# Patient Record
Sex: Male | Born: 1950 | Race: White | Hispanic: No | Marital: Single | Smoking: Never smoker
Health system: Southern US, Community
[De-identification: ages and names within clinical notes are randomized; demographics above are authoritative.]

## PROBLEM LIST (undated history)

## (undated) DIAGNOSIS — K5790 Diverticulosis of intestine, part unspecified, without perforation or abscess without bleeding: Secondary | ICD-10-CM

## (undated) DIAGNOSIS — I499 Cardiac arrhythmia, unspecified: Secondary | ICD-10-CM

## (undated) DIAGNOSIS — Z905 Acquired absence of kidney: Secondary | ICD-10-CM

## (undated) DIAGNOSIS — G4730 Sleep apnea, unspecified: Secondary | ICD-10-CM

## (undated) DIAGNOSIS — R519 Headache, unspecified: Secondary | ICD-10-CM

## (undated) DIAGNOSIS — I10 Essential (primary) hypertension: Secondary | ICD-10-CM

## (undated) DIAGNOSIS — I2510 Atherosclerotic heart disease of native coronary artery without angina pectoris: Secondary | ICD-10-CM

## (undated) DIAGNOSIS — I509 Heart failure, unspecified: Secondary | ICD-10-CM

## (undated) DIAGNOSIS — B1920 Unspecified viral hepatitis C without hepatic coma: Secondary | ICD-10-CM

## (undated) DIAGNOSIS — Z8619 Personal history of other infectious and parasitic diseases: Secondary | ICD-10-CM

## (undated) DIAGNOSIS — N183 Chronic kidney disease, stage 3 (moderate): Secondary | ICD-10-CM

## (undated) DIAGNOSIS — R51 Headache: Principal | ICD-10-CM

## (undated) DIAGNOSIS — C649 Malignant neoplasm of unspecified kidney, except renal pelvis: Secondary | ICD-10-CM

## (undated) HISTORY — DX: Diverticulosis of intestine, part unspecified, without perforation or abscess without bleeding: K57.90

## (undated) HISTORY — DX: Headache: R51

## (undated) HISTORY — DX: Essential (primary) hypertension: I10

## (undated) HISTORY — DX: Chronic kidney disease, stage 3 (moderate): N18.3

## (undated) HISTORY — DX: Personal history of other infectious and parasitic diseases: Z86.19

## (undated) HISTORY — PX: CORONARY ANGIOPLASTY: SHX604

## (undated) HISTORY — DX: Malignant neoplasm of unspecified kidney, except renal pelvis: C64.9

## (undated) HISTORY — PX: CARDIAC VALVE REPLACEMENT: SHX585

## (undated) HISTORY — DX: Heart failure, unspecified: I50.9

## (undated) HISTORY — DX: Headache, unspecified: R51.9

## (undated) HISTORY — DX: Sleep apnea, unspecified: G47.30

## (undated) HISTORY — DX: Acquired absence of kidney: Z90.5

## (undated) HISTORY — PX: OTHER SURGICAL HISTORY: SHX169

## (undated) HISTORY — DX: Atherosclerotic heart disease of native coronary artery without angina pectoris: I25.10

---
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
## 1976-07-02 HISTORY — PX: NASAL SINUS SURGERY: SHX719

## 2004-10-20 ENCOUNTER — Emergency Department: Payer: Self-pay | Admitting: Emergency Medicine

## 2004-10-27 ENCOUNTER — Emergency Department: Payer: Self-pay | Admitting: Emergency Medicine

## 2005-10-23 ENCOUNTER — Emergency Department: Payer: Self-pay | Admitting: General Practice

## 2008-11-30 HISTORY — PX: COLONOSCOPY: SHX174

## 2008-12-06 ENCOUNTER — Ambulatory Visit: Payer: Self-pay | Admitting: Gastroenterology

## 2010-01-10 ENCOUNTER — Emergency Department: Payer: Self-pay | Admitting: Emergency Medicine

## 2011-06-21 LAB — COMPREHENSIVE METABOLIC PANEL
ALT: 15 U/L (ref 10–40)
AST: 19 U/L
Alkaline Phosphatase: 59 U/L
Creat: 1.01
Glucose: 98
Total Bilirubin: 0.7 mg/dL

## 2011-06-21 LAB — LIPID PANEL
Cholesterol: 184 mg/dL (ref 0–200)
Direct LDL: 118
HDL: 40 mg/dL (ref 35–70)
Triglycerides: 131

## 2011-06-21 LAB — PSA: PSA: 1.4

## 2011-07-03 DIAGNOSIS — I2510 Atherosclerotic heart disease of native coronary artery without angina pectoris: Secondary | ICD-10-CM

## 2011-07-03 DIAGNOSIS — C649 Malignant neoplasm of unspecified kidney, except renal pelvis: Secondary | ICD-10-CM

## 2011-07-03 DIAGNOSIS — Z905 Acquired absence of kidney: Secondary | ICD-10-CM

## 2011-07-03 HISTORY — DX: Malignant neoplasm of unspecified kidney, except renal pelvis: C64.9

## 2011-07-03 HISTORY — DX: Acquired absence of kidney: Z90.5

## 2011-07-03 HISTORY — DX: Atherosclerotic heart disease of native coronary artery without angina pectoris: I25.10

## 2011-10-16 ENCOUNTER — Ambulatory Visit: Payer: Self-pay | Admitting: Family Medicine

## 2011-11-27 ENCOUNTER — Ambulatory Visit: Payer: Self-pay | Admitting: Family Medicine

## 2011-11-29 ENCOUNTER — Emergency Department: Payer: Self-pay | Admitting: Emergency Medicine

## 2011-11-29 LAB — URINALYSIS, COMPLETE
RBC,UR: 14310 /HPF (ref 0–5)
Squamous Epithelial: NONE SEEN
WBC UR: 66 /HPF (ref 0–5)

## 2011-11-29 LAB — CBC
HCT: 38.7 % — ABNORMAL LOW (ref 40.0–52.0)
HGB: 12.7 g/dL — ABNORMAL LOW (ref 13.0–18.0)
MCH: 29 pg (ref 26.0–34.0)
MCHC: 32.9 g/dL (ref 32.0–36.0)
MCV: 88 fL (ref 80–100)
Platelet: 172 10*3/uL (ref 150–440)
RBC: 4.38 10*6/uL — ABNORMAL LOW (ref 4.40–5.90)
RDW: 14.4 % (ref 11.5–14.5)
WBC: 10.2 10*3/uL (ref 3.8–10.6)

## 2011-11-29 LAB — COMPREHENSIVE METABOLIC PANEL
Albumin: 4.4 g/dL (ref 3.4–5.0)
Alkaline Phosphatase: 70 U/L (ref 50–136)
Anion Gap: 11 (ref 7–16)
BUN: 14 mg/dL (ref 7–18)
Bilirubin,Total: 1.1 mg/dL — ABNORMAL HIGH (ref 0.2–1.0)
Calcium, Total: 8.8 mg/dL (ref 8.5–10.1)
Chloride: 102 mmol/L (ref 98–107)
Co2: 24 mmol/L (ref 21–32)
Creatinine: 1.14 mg/dL (ref 0.60–1.30)
EGFR (African American): >60
EGFR (Non-African Amer.): >60
Glucose: 107 mg/dL — ABNORMAL HIGH (ref 65–99)
Osmolality: 275 (ref 275–301)
Potassium: 4.2 mmol/L (ref 3.5–5.1)
SGOT(AST): 23 U/L (ref 15–37)
SGPT (ALT): 21 U/L
Sodium: 137 mmol/L (ref 136–145)
Total Protein: 7.7 g/dL (ref 6.4–8.2)

## 2011-12-06 ENCOUNTER — Ambulatory Visit: Payer: Self-pay | Admitting: Urology

## 2011-12-31 HISTORY — PX: NEPHRECTOMY: SHX65

## 2012-01-11 LAB — COMPREHENSIVE METABOLIC PANEL
ALT: 23 U/L (ref 10–40)
AST: 24 U/L
Alkaline Phosphatase: 63 U/L
BUN: 13 mg/dL (ref 4–21)
Creat: 0.9
Glucose: 93
Potassium: 4.3 mmol/L
Sodium: 145 mmol/L (ref 137–147)
Total Bilirubin: 0.6 mg/dL

## 2012-01-11 LAB — CBC
Hemoglobin: 13.5 g/dL (ref 13.5–17.5)
WBC: 5.4
platelet count: 213

## 2012-02-01 ENCOUNTER — Ambulatory Visit (INDEPENDENT_AMBULATORY_CARE_PROVIDER_SITE_OTHER): Payer: BC Managed Care – PPO | Admitting: Family Medicine

## 2012-02-01 ENCOUNTER — Encounter: Payer: Self-pay | Admitting: Family Medicine

## 2012-02-01 VITALS — BP 152/90 | HR 68 | Temp 98.0°F | Ht 72.0 in | Wt 206.0 lb

## 2012-02-01 DIAGNOSIS — Z905 Acquired absence of kidney: Secondary | ICD-10-CM

## 2012-02-01 DIAGNOSIS — C649 Malignant neoplasm of unspecified kidney, except renal pelvis: Secondary | ICD-10-CM

## 2012-02-01 DIAGNOSIS — I10 Essential (primary) hypertension: Secondary | ICD-10-CM

## 2012-02-01 DIAGNOSIS — R519 Headache, unspecified: Secondary | ICD-10-CM | POA: Insufficient documentation

## 2012-02-01 DIAGNOSIS — Z85528 Personal history of other malignant neoplasm of kidney: Secondary | ICD-10-CM | POA: Insufficient documentation

## 2012-02-01 DIAGNOSIS — R51 Headache: Secondary | ICD-10-CM

## 2012-02-01 MED ORDER — FLUTICASONE PROPIONATE 50 MCG/ACT NA SUSP: 2.0000 | Freq: Every day | NASAL | Status: DC

## 2012-02-01 NOTE — Assessment & Plan Note (Signed)
Told needs lifelong monitoring by [facility] given furhman stage 3.

## 2012-02-01 NOTE — Progress Notes (Signed)
Subjective:    Patient ID: Aaron Cole, male    DOB: Jul 22, 1950, 61 y.o.   MRN: [id number]  HPI CC: new pt establish  Prior saw Dr. Bethena Midget at [facility] medical who retired.  Recent R nephrectomy for RCC, stage 1, furhman scale 3 - brings copy of cat scan - told to check on heart because there was some calcium in heart vessels.  Sees Dr. Joseph Art at cancer center at [facility], told needed to be monitored for future tumors.  Found after hematuria 10/2011.  Seen by Dr. Achilles Dunk, urology.  ?CAD on CT - denies chest pain, tightness, SOB.  Stays very active bicycling.  Good aerobic exercise.  HTN - only on micardis.  H/o this for last 6 yrs.  Today elevated, but states at home bp runs 120-130systolic.  Frequent headaches - once a week.  Severe throbbing headache with pressure, frontal bilateral.  Not associated with nausea, photophonophobia.  Usually treats with aspirin 1000mg  bid.  Takes flonase with headaches.  H/o sinus surgery in past as young adult.  Stays congested/RN.  Doesn't like daily antihistamine.  Has flonase but uses only as needed.  Preventative: Colonoscopy 10/2009 - told normal, rec rpt 8 yrs. Last CPE 06/2011.  Caffeine: occasional Lives alone, 1 cat Occupation: farming business Edu: BS Activity: bikes 3-4x/wk about 1 hour Diet: good water, fruits/vegetables daily, steak 1x/wk, fish 2x/wk  Medications and allergies reviewed and updated in chart.  Past histories reviewed and updated if relevant as below. There is no problem list on file for this patient.  Past Medical History  Diagnosis Date  . Generalized headaches     frequent; sinus related  . HTN (hypertension)   . Renal cell carcinoma     s/p R nephrectomy   Past Surgical History  Procedure Date  . Nephrectomy     [facility], R RCC  . Nasal sinus surgery 1978    sinus surgery done at [facility]   History  Substance Use Topics  . Smoking status: Never Smoker   . Smokeless tobacco: Never Used  . Alcohol Use: Yes   Socially   Family History  Problem Relation Age of Onset  . Cancer Paternal Grandmother     throat  . Heart disease Mother   . Heart disease Father   . Coronary artery disease Maternal Uncle     MI  . Cancer Paternal Aunt     breast   . Hypertension Mother   . Diabetes Father   . Stroke Neg Hx    Allergies  Allergen Reactions  . Tylenol (Acetaminophen) Nausea And Vomiting   Current Outpatient Prescriptions on File Prior to Visit  Medication Sig Dispense Refill  . fluticasone (FLONASE) 50 MCG/ACT nasal spray Place 2 sprays into the nose daily.      . sildenafil (VIAGRA) 100 MG tablet Take 100 mg by mouth as needed.      Marland Kitchen telmisartan (MICARDIS) 40 MG tablet Take 40 mg by mouth daily.         Review of Systems  Constitutional: Negative for fever, chills, activity change, appetite change, fatigue and unexpected weight change.  HENT: Negative for hearing loss and neck pain.   Eyes: Negative for visual disturbance.  Respiratory: Negative for cough, chest tightness, shortness of breath and wheezing.   Cardiovascular: Negative for chest pain, palpitations and leg swelling.  Gastrointestinal: Negative for nausea, vomiting, abdominal pain, diarrhea, constipation, blood in stool and abdominal distention.  Genitourinary: Negative for hematuria and  difficulty urinating.  Musculoskeletal: Negative for myalgias and arthralgias.  Skin: Negative for rash.  Neurological: Positive for headaches. Negative for dizziness, seizures and syncope.  Hematological: Does not bruise/bleed easily.  Psychiatric/Behavioral: Negative for dysphoric mood. The patient is not nervous/anxious.        Objective:   Physical Exam  Nursing note and vitals reviewed. Constitutional: He is oriented to person, place, and time. He appears well-developed and well-nourished. No distress.  HENT:  Head: Normocephalic and atraumatic.  Right Ear: External ear normal.  Left Ear: External ear normal.  Nose: Nose normal.   Mouth/Throat: Oropharynx is clear and moist.  Eyes: Conjunctivae and EOM are normal. Pupils are equal, round, and reactive to light.  Neck: Normal range of motion. Neck supple.  Cardiovascular: Normal rate, regular rhythm, normal heart sounds and intact distal pulses.   No murmur heard. Pulses:      Radial pulses are 2+ on the right side, and 2+ on the left side.  Pulmonary/Chest: Effort normal and breath sounds normal. No respiratory distress. He has no wheezes. He has no rales.  Abdominal: Soft. Bowel sounds are normal. He exhibits no distension and no mass. There is no tenderness. There is no rebound and no guarding.       Incision on abd c/d/i, healing  Musculoskeletal: Normal range of motion. He exhibits no edema.  Lymphadenopathy:    He has no cervical adenopathy.  Neurological: He is alert and oriented to person, place, and time.       CN grossly intact, station and gait intact  Skin: Skin is warm and dry. No rash noted.  Psychiatric: He has a normal mood and affect. His behavior is normal. Judgment and thought content normal.       Assessment & Plan:

## 2012-02-01 NOTE — Assessment & Plan Note (Signed)
Chronic, stable. Continue micardis. Will need close monitoring, discussed new goal will likely be <130/80 States at home better control, so no changes today. Asked him to call me if bp running consistently high.

## 2012-02-01 NOTE — Patient Instructions (Signed)
Request records from Dr. Achilles Dunk for me. Try daily flonase 2 squirts in each nostril daily. Ok to use aspirin, but watch stomach upset on aspirin. I will review records. Return as needed or after December for physical.

## 2012-02-01 NOTE — Assessment & Plan Note (Addendum)
Sound like sinus headaches. Discussed avoidance of NSAIDs given sole kidney. Recommended daily flonase to try and minimize headaches. Will await records from prior pcp, consider referral to ENT.

## 2012-02-01 NOTE — Assessment & Plan Note (Signed)
Now chronic renal disease given h/o nephrectomy. Discussed importance of hydration as well as avoiding NSAIDs.

## 2012-02-09 ENCOUNTER — Encounter: Payer: Self-pay | Admitting: Family Medicine

## 2012-02-15 ENCOUNTER — Encounter: Payer: Self-pay | Admitting: Family Medicine

## 2012-03-03 ENCOUNTER — Encounter: Payer: Self-pay | Admitting: Family Medicine

## 2012-03-10 ENCOUNTER — Encounter: Payer: Self-pay | Admitting: Family Medicine

## 2012-03-17 DIAGNOSIS — C649 Malignant neoplasm of unspecified kidney, except renal pelvis: Secondary | ICD-10-CM | POA: Insufficient documentation

## 2012-06-11 ENCOUNTER — Other Ambulatory Visit: Payer: Self-pay | Admitting: *Deleted

## 2012-06-11 MED ORDER — TELMISARTAN 40 MG PO TABS: 40.0000 mg | ORAL_TABLET | Freq: Every day | ORAL | Status: DC

## 2012-07-04 ENCOUNTER — Encounter: Payer: BC Managed Care – PPO | Admitting: Family Medicine

## 2012-07-04 ENCOUNTER — Other Ambulatory Visit: Payer: BC Managed Care – PPO

## 2012-07-09 ENCOUNTER — Other Ambulatory Visit: Payer: BC Managed Care – PPO

## 2012-07-11 ENCOUNTER — Encounter: Payer: BC Managed Care – PPO | Admitting: Family Medicine

## 2012-07-13 ENCOUNTER — Other Ambulatory Visit: Payer: Self-pay | Admitting: Family Medicine

## 2012-07-13 DIAGNOSIS — Z125 Encounter for screening for malignant neoplasm of prostate: Secondary | ICD-10-CM

## 2012-07-13 DIAGNOSIS — I10 Essential (primary) hypertension: Secondary | ICD-10-CM

## 2012-07-16 ENCOUNTER — Encounter: Payer: BC Managed Care – PPO | Admitting: Family Medicine

## 2012-07-16 ENCOUNTER — Other Ambulatory Visit: Payer: BC Managed Care – PPO

## 2012-07-23 ENCOUNTER — Encounter: Payer: BC Managed Care – PPO | Admitting: Family Medicine

## 2012-07-25 ENCOUNTER — Other Ambulatory Visit (INDEPENDENT_AMBULATORY_CARE_PROVIDER_SITE_OTHER): Payer: BC Managed Care – PPO

## 2012-07-25 DIAGNOSIS — I10 Essential (primary) hypertension: Secondary | ICD-10-CM

## 2012-07-25 DIAGNOSIS — Z125 Encounter for screening for malignant neoplasm of prostate: Secondary | ICD-10-CM

## 2012-07-25 LAB — PSA: PSA: 2.53 ng/mL (ref 0.10–4.00)

## 2012-07-25 LAB — BASIC METABOLIC PANEL
CO2: 27 mEq/L (ref 19–32)
Calcium: 9.3 mg/dL (ref 8.4–10.5)
Chloride: 107 mEq/L (ref 96–112)
Creatinine, Ser: 1.3 mg/dL (ref 0.4–1.5)
Glucose, Bld: 101 mg/dL — ABNORMAL HIGH (ref 70–99)

## 2012-07-25 LAB — LIPID PANEL
HDL: 36 mg/dL — ABNORMAL LOW (ref >39.00)
LDL Cholesterol: 124 mg/dL — ABNORMAL HIGH (ref 0–99)
Total CHOL/HDL Ratio: 5
Triglycerides: 127 mg/dL (ref 0.0–149.0)

## 2012-07-28 ENCOUNTER — Encounter: Payer: BC Managed Care – PPO | Admitting: Family Medicine

## 2012-08-01 ENCOUNTER — Encounter: Payer: Self-pay | Admitting: Family Medicine

## 2012-08-01 ENCOUNTER — Encounter: Payer: BC Managed Care – PPO | Admitting: Family Medicine

## 2012-08-01 ENCOUNTER — Ambulatory Visit (INDEPENDENT_AMBULATORY_CARE_PROVIDER_SITE_OTHER): Payer: BC Managed Care – PPO | Admitting: Family Medicine

## 2012-08-01 VITALS — BP 120/92 | HR 71 | Temp 98.9°F | Ht 70.5 in | Wt 208.0 lb

## 2012-08-01 DIAGNOSIS — C649 Malignant neoplasm of unspecified kidney, except renal pelvis: Secondary | ICD-10-CM

## 2012-08-01 DIAGNOSIS — I10 Essential (primary) hypertension: Secondary | ICD-10-CM

## 2012-08-01 DIAGNOSIS — Z0000 Encounter for general adult medical examination without abnormal findings: Secondary | ICD-10-CM

## 2012-08-01 DIAGNOSIS — R519 Headache, unspecified: Secondary | ICD-10-CM

## 2012-08-01 DIAGNOSIS — R51 Headache: Secondary | ICD-10-CM

## 2012-08-01 NOTE — Assessment & Plan Note (Signed)
Improved with flonase 

## 2012-08-01 NOTE — Patient Instructions (Addendum)
Check blood pressure at home once a week, and let me know if consistently >140/90. Sign release form for CT scan report from [facility]. Return on Monday for Tdap. We will start checking stool again around 2016. Good to see you today, call us with questions.

## 2012-08-01 NOTE — Assessment & Plan Note (Addendum)
Preventative protocols reviewed and updated unless pt declined. Discussed healthy diet and lifestyle. ?of CAD on recent CT scan last year - I have requested records of CT report to review. If significant, may recommend start ASA daily.

## 2012-08-01 NOTE — Assessment & Plan Note (Signed)
BP Readings from Last 3 Encounters:  08/01/12 120/92  02/01/12 152/90  Slightly elevated today. Recommended he monitor at home and update me if bp staying elevated at home.

## 2012-08-01 NOTE — Assessment & Plan Note (Signed)
Knows to avoid nsaids. Sees urologist/onc at [facility] yearly.

## 2012-08-01 NOTE — Progress Notes (Signed)
Subjective:    Patient ID: Aaron Cole, male    DOB: 31-Jan-1951, 62 y.o.   MRN: [id number]  HPI CC: CPE  DOT CPE last week.  Sees urologist once a year [facility]).  H/o R RCC s/p R nephrectomy. Lab Results  Component Value Date   CREATININE 1.3 07/25/2012    Preventative:  Colonoscopy 10/2009 - told normal, rec rpt 8 yrs.  Prostate cancer screening - will continue screening. Flu shot done Tetanus - will get Monday.  Caffeine: occasional  Lives alone, 1 cat  Occupation: farming business  Edu: BS  Activity: bikes 3-4x/wk about 1 hour  Diet: good water, fruits/vegetables daily, steak 1x/wk, fish 2x/wk  Medications and allergies reviewed and updated in chart.  Past histories reviewed and updated if relevant as below. Patient Active Problem List  Diagnosis  . Sinus headache  . HTN (hypertension)  . Renal cell carcinoma  . S/p nephrectomy   Past Medical History  Diagnosis Date  . Sinus headache     recurrent  . HTN (hypertension)   . Renal cell carcinoma 2013    s/p R nephrectomy  . S/p nephrectomy 2013    [facility]  . CAD (coronary artery disease) 2013    per CT scan  - brings CD of CT, no report - will need [facility] records of CT scan report  . Diverticulosis     by CT scan   Past Surgical History  Procedure Date  . Nasal sinus surgery 1978    sinus surgery done at [facility]  . Rectal fistula repair 1980s  . Nephrectomy 12/2011    [facility], R for RCC   History  Substance Use Topics  . Smoking status: Never Smoker   . Smokeless tobacco: Never Used  . Alcohol Use: Yes     Comment: Socially   Family History  Problem Relation Age of Onset  . Cancer Paternal Grandmother     throat  . Heart disease Mother   . Heart disease Father   . Coronary artery disease Maternal Uncle     MI  . Cancer Paternal Aunt     breast   . Hypertension Mother   . Diabetes Father   . Stroke Neg Hx   . Cancer Paternal Aunt     brain cancer   Allergies  Allergen Reactions  . Tylenol  (Acetaminophen) Nausea And Vomiting   Current Outpatient Prescriptions on File Prior to Visit  Medication Sig Dispense Refill  . fluticasone (FLONASE) 50 MCG/ACT nasal spray Place 2 sprays into the nose daily.  16 g  11  . telmisartan (MICARDIS) 40 MG tablet Take 1 tablet (40 mg total) by mouth daily.  30 tablet  6  . sildenafil (VIAGRA) 100 MG tablet Take 100 mg by mouth as needed.        Review of Systems  Constitutional: Positive for fever (head cold recently). Negative for chills, activity change, appetite change, fatigue and unexpected weight change.  HENT: Negative for hearing loss and neck pain.   Eyes: Negative for visual disturbance.  Respiratory: Positive for cough. Negative for chest tightness, shortness of breath and wheezing.   Cardiovascular: Negative for chest pain, palpitations and leg swelling.  Gastrointestinal: Negative for nausea, vomiting, abdominal pain, diarrhea, constipation, blood in stool and abdominal distention.  Genitourinary: Negative for hematuria and difficulty urinating.  Musculoskeletal: Negative for myalgias and arthralgias.  Skin: Negative for rash.  Neurological: Positive for headaches. Negative for dizziness, seizures and syncope.  Hematological: Does  not bruise/bleed easily.  Psychiatric/Behavioral: Negative for dysphoric mood. The patient is not nervous/anxious.        Objective:   Physical Exam  Nursing note and vitals reviewed. Constitutional: He is oriented to person, place, and time. He appears well-developed and well-nourished. No distress.  HENT:  Head: Normocephalic and atraumatic.  Right Ear: Hearing, tympanic membrane, external ear and ear canal normal.  Left Ear: Hearing, tympanic membrane, external ear and ear canal normal.  Nose: Nose normal.  Mouth/Throat: Oropharynx is clear and moist. No oropharyngeal exudate.  Eyes: Conjunctivae normal and EOM are normal. Pupils are equal, round, and reactive to light. No scleral icterus.   Neck: Normal range of motion. Neck supple. No thyromegaly present.  Cardiovascular: Normal rate, regular rhythm and intact distal pulses.   Occasional extrasystoles are present.  Murmur (slight systolic murmur) heard. Pulses:      Radial pulses are 2+ on the right side, and 2+ on the left side.  Pulmonary/Chest: Effort normal and breath sounds normal. No respiratory distress. He has no wheezes. He has no rales.  Abdominal: Soft. Bowel sounds are normal. He exhibits no distension and no mass. There is no tenderness. There is no rebound and no guarding.  Genitourinary: Rectum normal and prostate normal. Rectal exam shows no external hemorrhoid, no internal hemorrhoid, no fissure, no mass, no tenderness and anal tone normal. Prostate is not enlarged (20gm) and not tender.  Musculoskeletal: Normal range of motion. He exhibits no edema.  Lymphadenopathy:    He has no cervical adenopathy.  Neurological: He is alert and oriented to person, place, and time.       CN grossly intact, station and gait intact  Skin: Skin is warm and dry. No rash noted.  Psychiatric: He has a normal mood and affect. His behavior is normal. Judgment and thought content normal.       Assessment & Plan:

## 2012-08-07 ENCOUNTER — Ambulatory Visit: Payer: BC Managed Care – PPO

## 2012-08-19 ENCOUNTER — Ambulatory Visit (INDEPENDENT_AMBULATORY_CARE_PROVIDER_SITE_OTHER): Payer: BC Managed Care – PPO | Admitting: *Deleted

## 2012-08-19 DIAGNOSIS — Z23 Encounter for immunization: Secondary | ICD-10-CM

## 2012-09-10 ENCOUNTER — Ambulatory Visit (INDEPENDENT_AMBULATORY_CARE_PROVIDER_SITE_OTHER): Payer: BC Managed Care – PPO | Admitting: Family Medicine

## 2012-09-10 ENCOUNTER — Encounter: Payer: Self-pay | Admitting: Family Medicine

## 2012-09-10 VITALS — BP 120/90 | HR 80 | Temp 99.0°F | Ht 70.5 in | Wt 210.8 lb

## 2012-09-10 DIAGNOSIS — R040 Epistaxis: Secondary | ICD-10-CM

## 2012-09-11 NOTE — Progress Notes (Signed)
Nature conservation officer at [facility] [address] [place] [address] Phone: [phone] Fax: [phone]  Date:  09/10/2012   Name:  Aaron Cole   DOB:  09-25-1950   MRN:  [id number] Gender: male Age: 62 y.o.  Primary Physician:  Eustaquio Boyden, MD  Evaluating MD: Hannah Beat, MD   Chief Complaint: nose bleeds   History of Present Illness:  Aaron Cole is a 62 y.o. pleasant patient who presents with the following:  Pleasant gentleman who has had 4 episodes of epistaxis in the last 2 days. They have not been profuse, but several of them have woken him up from sleep. He has been able to get his nose to stop bleeding within a few minutes to 10 minutes. This has not been a long-standing problem for him. He has to use his Flonase in the past, but he has used it most recently several weeks ago. He also does occasionally takes an aspirin products, but they do not align with his most recent nosebleeds. No trauma.  Patient Active Problem List  Diagnosis  . Sinus headache  . HTN (hypertension)  . Renal cell carcinoma  . S/p nephrectomy  . Routine health maintenance    Past Medical History  Diagnosis Date  . Sinus headache     recurrent  . HTN (hypertension)   . Renal cell carcinoma 2013    s/p R nephrectomy  . S/p nephrectomy 2013    [facility]  . CAD (coronary artery disease) 2013    per CT scan  - brings CD of CT, no report - will need [facility] records of CT scan report  . Diverticulosis     by CT scan    Past Surgical History  Procedure Laterality Date  . Nasal sinus surgery  1978    sinus surgery done at [facility]  . Rectal fistula repair  1980s  . Nephrectomy  12/2011    [facility], R for RCC    History   Social History  . Marital Status: Single    Spouse Name: N/A    Number of Children: N/A  . Years of Education: N/A   Occupational History  . Not on file.   Social History Main Topics  . Smoking status: Never Smoker   . Smokeless tobacco: Never Used  .  Alcohol Use: Yes     Comment: Socially  . Drug Use: No  . Sexually Active: Not on file   Other Topics Concern  . Not on file   Social History Narrative   Caffeine: occasional   Lives alone, 1 cat   Occupation: farming business   Edu: BS   Activity: bikes 3-4x/wk about 1 hour   Diet: good water, fruits/vegetables daily, steak 1x/wk, fish 2x/wk    Family History  Problem Relation Age of Onset  . Cancer Paternal Grandmother     throat  . Heart disease Mother   . Heart disease Father   . Coronary artery disease Maternal Uncle     MI  . Cancer Paternal Aunt     breast   . Hypertension Mother   . Diabetes Father   . Stroke Neg Hx   . Cancer Paternal Aunt     brain cancer    Allergies  Allergen Reactions  . Tylenol (Acetaminophen) Nausea And Vomiting    Medication list has been reviewed and updated.  Outpatient Prescriptions Prior to Visit  Medication Sig Dispense Refill  . fluticasone (FLONASE) 50 MCG/ACT nasal  spray Place 2 sprays into the nose daily.  16 g  11  . telmisartan (MICARDIS) 40 MG tablet Take 1 tablet (40 mg total) by mouth daily.  30 tablet  6  . sildenafil (VIAGRA) 100 MG tablet Take 100 mg by mouth as needed.       No facility-administered medications prior to visit.    Review of Systems:   GEN: No acute illnesses, no fevers, chills. GI: No n/v/d, eating normally Pulm: No SOB Interactive and getting along well at home.  Otherwise, ROS is as per the HPI.   Physical Examination: BP 120/90  Pulse 80  Temp(Src) 99 F (37.2 C) (Oral)  Ht 5' 10.5" (1.791 m)  Wt 210 lb 12 oz (95.596 kg)  BMI 29.8 kg/m2  SpO2 96%  Ideal Body Weight: Weight in (lb) to have BMI = 25: 176.4   GEN: WDWN, NAD, Non-toxic, A & O x 3 HEENT: Atraumatic, Normocephalic. Neck supple. No masses, No LAD. Ears and Nose: No external deformity. CV: RRR, No M/G/R. No JVD. No thrill. No extra heart sounds. PULM: CTA B, no wheezes, crackles, rhonchi. No retractions. No resp.  distress. No accessory muscle use. EXTR: No c/c/e NEURO Normal gait.  PSYCH: Normally interactive. Conversant. Not depressed or anxious appearing.  Calm demeanor.    Assessment and Plan: Epistaxis   Afrin twice a day in the nose for the next 3 days. If acute nose bleed, that he can do some acute Afrin and then also use a teabag at the base of his nose. If his symptoms continue and persist over the next week, asked him to call us and he is going to follow up with an ear nose and throat specialist. Overall, reassured him.  Signed, Elpidio Galea. Cordarrel Stiefel, MD 09/11/2012 10:29 AM

## 2012-09-22 ENCOUNTER — Other Ambulatory Visit: Payer: Self-pay

## 2012-09-22 MED ORDER — SILDENAFIL CITRATE 100 MG PO TABS: 50.0000 mg | ORAL_TABLET | ORAL | Status: DC | PRN

## 2012-09-22 NOTE — Telephone Encounter (Signed)
Pt left v/m requesting refill Viagra; pt said [facility] requested on Fri and pt wants call back when sent to pharmacy.Please advise.

## 2012-09-22 NOTE — Telephone Encounter (Signed)
Sent in.plz notify pt.  

## 2012-09-22 NOTE — Telephone Encounter (Signed)
Message left notifying patient.

## 2012-10-02 ENCOUNTER — Ambulatory Visit (INDEPENDENT_AMBULATORY_CARE_PROVIDER_SITE_OTHER): Payer: BC Managed Care – PPO | Admitting: Family Medicine

## 2012-10-02 ENCOUNTER — Encounter: Payer: Self-pay | Admitting: Family Medicine

## 2012-10-02 VITALS — BP 130/92 | HR 84 | Temp 98.8°F | Ht 72.0 in | Wt 208.5 lb

## 2012-10-02 DIAGNOSIS — N433 Hydrocele, unspecified: Secondary | ICD-10-CM | POA: Insufficient documentation

## 2012-10-02 DIAGNOSIS — N5089 Other specified disorders of the male genital organs: Secondary | ICD-10-CM

## 2012-10-02 LAB — POCT URINALYSIS DIPSTICK
Ketones, UA: NEGATIVE
Leukocytes, UA: NEGATIVE
Protein, UA: NEGATIVE
Spec Grav, UA: 1.02
Urobilinogen, UA: 0.2
pH, UA: 6

## 2012-10-02 NOTE — Patient Instructions (Signed)
pass by Aaron Cole's office for referral for ultrasound. I think you have hydrocele which we can just monitor as long as pain is not developing. watch for pain, fevers/chills, urethral discharge, or urinary changes.

## 2012-10-02 NOTE — Progress Notes (Signed)
  Subjective:    Patient ID: Aaron Cole, male    DOB: 1950-12-25, 62 y.o.   MRN: [id number]  HPI CC: R testicular swelling  Over last few weeks, noticing enlarging testicle on right.  Denies falls or injury.  No pain.  No fevers/chills, dysuria, urgency, frequency, hematuria.  No urethral discharge.  Never had anything like this before.  Currently sexually active, 6 partners in last year.  Protection 50% of time.  H/o gonorrhea, treated, 6-7 yrs ago.  No other STDs.  Past Medical History  Diagnosis Date  . Sinus headache     recurrent  . HTN (hypertension)   . Renal cell carcinoma 2013    s/p R nephrectomy  . S/p nephrectomy 2013    [facility]  . CAD (coronary artery disease) 2013    per CT scan  - brings CD of CT, no report - will need [facility] records of CT scan report  . Diverticulosis     by CT scan    Review of Systems Per HPI    Objective:   Physical Exam  Nursing note and vitals reviewed. Constitutional: He appears well-developed and well-nourished. No distress.  Abdominal: Soft. Normal appearance and bowel sounds are normal. He exhibits no distension and no mass. There is no hepatosplenomegaly. There is no tenderness. There is no rigidity, no rebound, no guarding, no CVA tenderness and negative Murphy's sign. No hernia. Hernia confirmed negative in the right inguinal area and confirmed negative in the left inguinal area.  Genitourinary: Penis normal. Right testis shows mass and swelling. Right testis shows no tenderness. Right testis is descended. Left testis shows no mass, no swelling and no tenderness. Left testis is descended. Circumcised. No penile erythema. No discharge found.  R testicle enlarged with evident cystic swelling, ?hydrocele associated with actual testicle. No tenderness to palpation.  Lymphadenopathy:       Right: No inguinal adenopathy present.       Left: No inguinal adenopathy present.       Assessment & Plan:

## 2012-10-03 ENCOUNTER — Encounter: Payer: Self-pay | Admitting: Family Medicine

## 2012-10-03 NOTE — Assessment & Plan Note (Signed)
?   hydrocele associated with R testicle. Nontender, doubt infectious Will obtain US scrotum to further evaluate. UA WNL today. Encouraged protection 100%.

## 2012-10-03 NOTE — Addendum Note (Signed)
Addended by: Josph Macho A on: 10/03/2012 07:57 AM   Modules accepted: Orders

## 2012-10-06 ENCOUNTER — Ambulatory Visit: Payer: Self-pay | Admitting: Family Medicine

## 2012-10-07 ENCOUNTER — Encounter: Payer: Self-pay | Admitting: Family Medicine

## 2012-12-08 ENCOUNTER — Encounter: Payer: Self-pay | Admitting: Family Medicine

## 2013-03-10 DIAGNOSIS — C649 Malignant neoplasm of unspecified kidney, except renal pelvis: Secondary | ICD-10-CM | POA: Insufficient documentation

## 2013-03-24 IMAGING — CT CT ABD-PELV W/ CM
1 series · 1 of 1 positions shown, 3 images · non-contrast
Comparison: none

REASON FOR EXAM: WITH DELAYS Renal cyst
COMMENTS:

[Series 1011: results ct abdomen · 0.59mm/px · 1 of 1 slices shown, 3 images]
[im 1/1  soft-tissue]
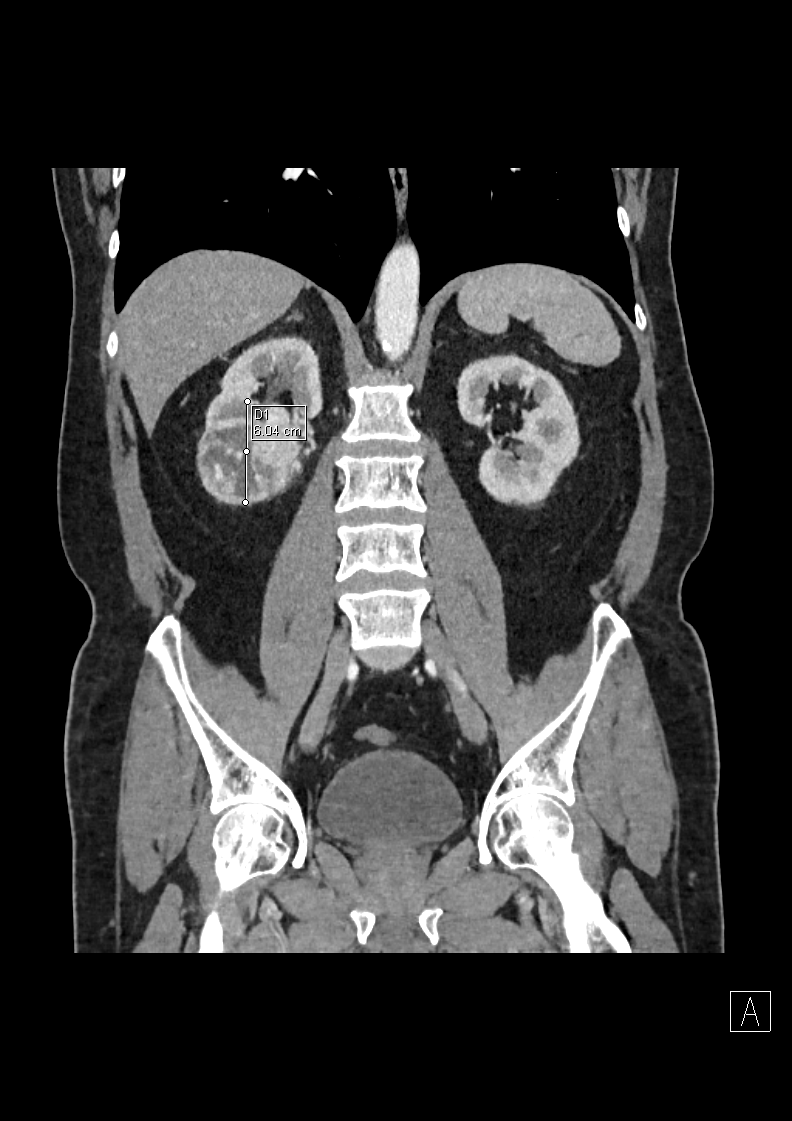
[im 1/1  lung]
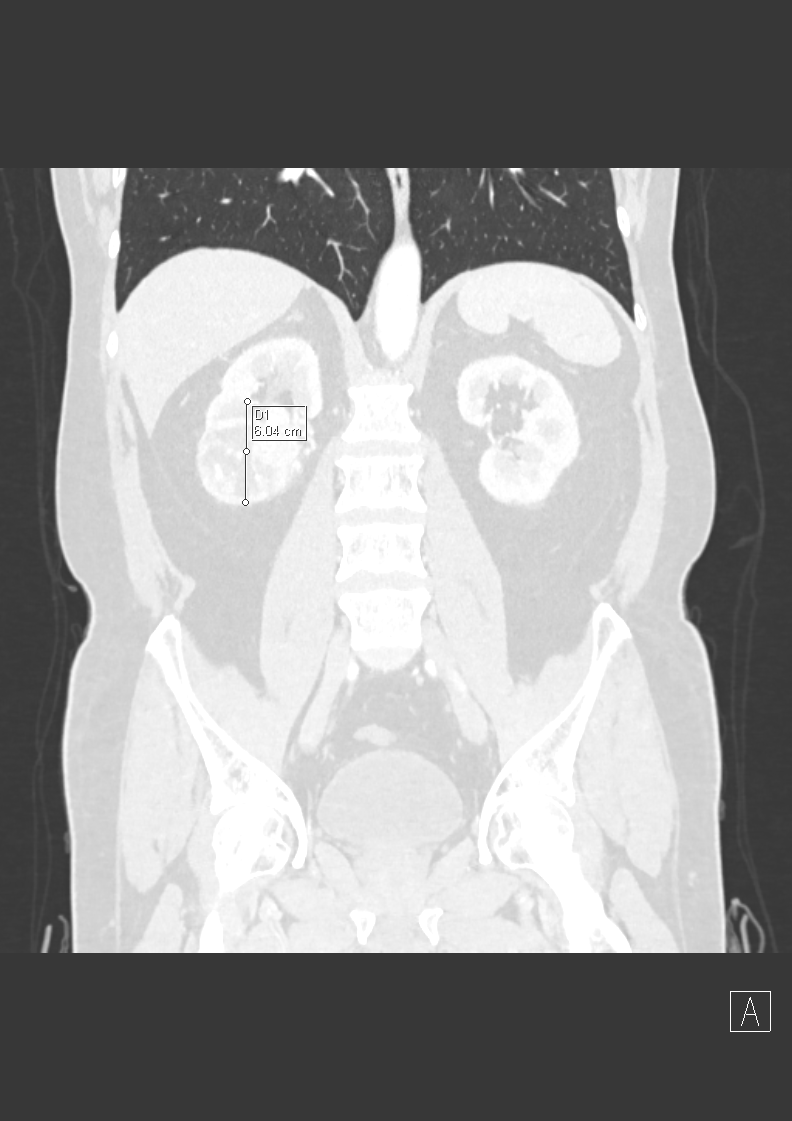
[im 1/1  bone]
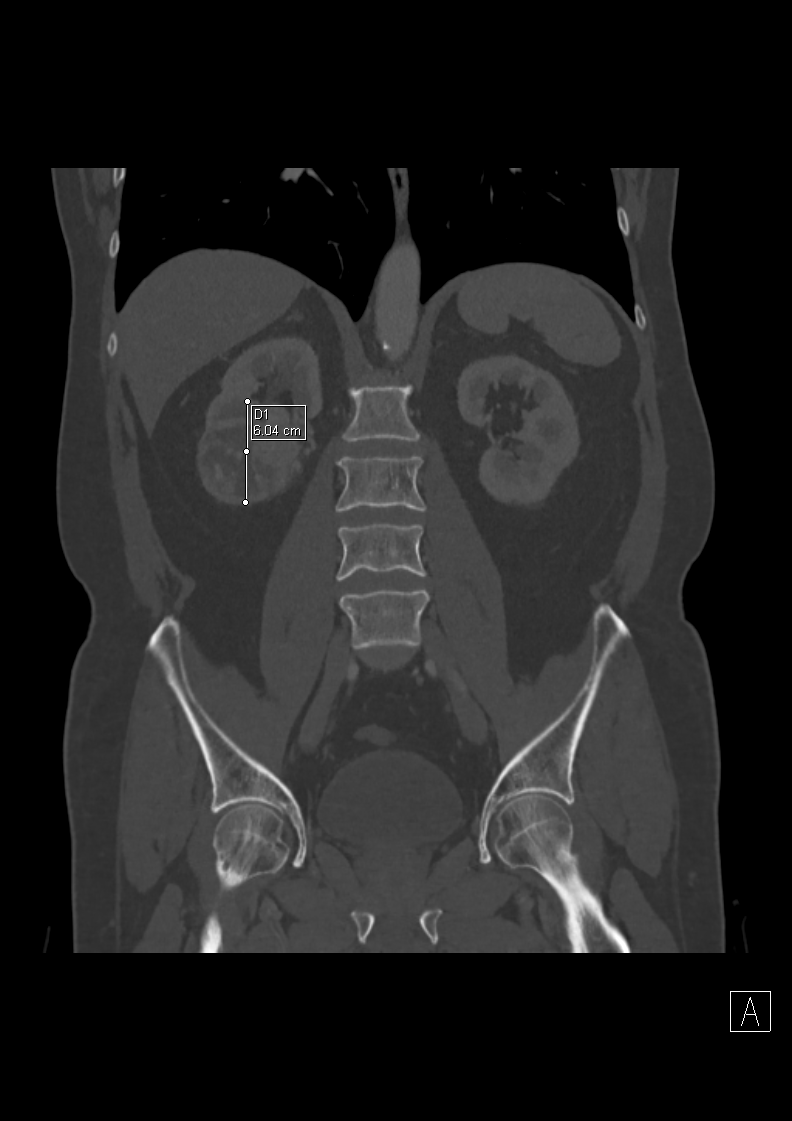

[1 of 1 positions shown; findings below may reference images not displayed]

PROCEDURE:     KCT - KCT ABDOMEN/PELVIS W  - December 06, 2011 [DATE]

RESULT:     Axial CT scanning was performed through the abdomen and pelvis
with reconstructions at 3 mm intervals and slice thicknesses. The patient
received 100 cc of Esovue-7RM. Comparison is made to the study 29 November, 2011. Review of multiplanar reconstructed images was performed separately on
the VIA monitor.

In the midpole of the right kidney posteriorly there is a large mass which
demonstrates heterogeneous enhancement. The mass measures 6.5 cm AP by
cm transversely by 6 cm in superior to inferior dimension. It remains
abnormal in appearance on the delayed images. Some extension of the mass
into the right renal pelvis is suspected. On the left I see no abnormal
masses. Neither kidney exhibits evidence of obstruction. There may be a
subcentimeter lymph node posterior to the right kidney on image 52. On
delayed images contrast is fractionally seen in the ureters. The partially
distended urinary bladder is normal in appearance.

The liver, spleen, partially distended stomach, adrenal glands, and pancreas
are normal in appearance. There is a small hiatal hernia. The gallbladder is
only partially distended. The caliber of the abdominal aorta is normal. I
see no periaortic nor pericaval lymphadenopathy. There is no evidence of
extension of tumor into the right renal vein.

The unopacified loops of small and large bowel exhibit no acute abnormality.
The prostate gland and seminal vesicles and urinary bladder exhibit no acute
abnormality. The lung bases are clear.
IMPRESSION: 1. There is an abnormal mass involving the midpole of the right kidney as
described. This is suspicious for malignancy. It may extend into the renal
pelvis.
2. I see no evidence of abnormal masses on the left.
3. I do not see acute abnormality elsewhere within the abdomen or pelvis.

## 2013-04-02 ENCOUNTER — Other Ambulatory Visit: Payer: Self-pay | Admitting: Family Medicine

## 2013-05-07 ENCOUNTER — Other Ambulatory Visit: Payer: Self-pay | Admitting: Family Medicine

## 2013-07-02 DIAGNOSIS — N183 Chronic kidney disease, stage 3 unspecified: Secondary | ICD-10-CM

## 2013-07-02 HISTORY — DX: Chronic kidney disease, stage 3 unspecified: N18.30

## 2013-08-17 ENCOUNTER — Other Ambulatory Visit: Payer: Self-pay | Admitting: Family Medicine

## 2013-08-17 NOTE — Telephone Encounter (Signed)
Pt called re; refill micardis, advised refill x1 and pt to cb for appt.

## 2013-09-17 ENCOUNTER — Telehealth: Payer: Self-pay | Admitting: *Deleted

## 2013-09-17 ENCOUNTER — Other Ambulatory Visit: Payer: Self-pay | Admitting: Family Medicine

## 2013-09-17 NOTE — Telephone Encounter (Signed)
plz clarify with pharmacy - 20mg  isn't erectile dysfunction dose.

## 2013-09-17 NOTE — Telephone Encounter (Signed)
Refill request from pharmacy for viagra 20 mg instead of 100 mg due to cost.

## 2013-09-18 NOTE — Telephone Encounter (Signed)
They were suggesting the 20 mg tabs 3-5 tabs at once PRN to help get to the 100 mg dose since the 20 mg is generic and the 100 mg isn't. This way would help save him $ where the 100 mg tablet # 10 would run $300.00. It was only a suggestion from them to try to help him.

## 2013-09-20 MED ORDER — SILDENAFIL CITRATE 20 MG PO TABS: 40.0000 mg | ORAL_TABLET | ORAL | Status: DC | PRN

## 2013-09-20 NOTE — Telephone Encounter (Signed)
Sent in

## 2013-09-23 ENCOUNTER — Other Ambulatory Visit: Payer: Self-pay | Admitting: Family Medicine

## 2013-09-23 DIAGNOSIS — I10 Essential (primary) hypertension: Secondary | ICD-10-CM

## 2013-09-23 DIAGNOSIS — C649 Malignant neoplasm of unspecified kidney, except renal pelvis: Secondary | ICD-10-CM

## 2013-09-23 DIAGNOSIS — Z125 Encounter for screening for malignant neoplasm of prostate: Secondary | ICD-10-CM

## 2013-09-24 ENCOUNTER — Other Ambulatory Visit: Payer: BC Managed Care – PPO

## 2013-09-25 ENCOUNTER — Other Ambulatory Visit (INDEPENDENT_AMBULATORY_CARE_PROVIDER_SITE_OTHER): Payer: BC Managed Care – PPO

## 2013-09-25 DIAGNOSIS — Z0000 Encounter for general adult medical examination without abnormal findings: Secondary | ICD-10-CM

## 2013-09-25 DIAGNOSIS — Z125 Encounter for screening for malignant neoplasm of prostate: Secondary | ICD-10-CM

## 2013-09-25 DIAGNOSIS — I10 Essential (primary) hypertension: Secondary | ICD-10-CM

## 2013-09-25 DIAGNOSIS — C649 Malignant neoplasm of unspecified kidney, except renal pelvis: Secondary | ICD-10-CM

## 2013-09-25 LAB — RENAL FUNCTION PANEL
Albumin: 4.1 g/dL (ref 3.5–5.2)
BUN: 21 mg/dL (ref 6–23)
CALCIUM: 9.6 mg/dL (ref 8.4–10.5)
CO2: 24 meq/L (ref 19–32)
CREATININE: 1.4 mg/dL (ref 0.4–1.5)
Chloride: 108 mEq/L (ref 96–112)
GFR: 56.61 mL/min — AB (ref >60.00)
Glucose, Bld: 96 mg/dL (ref 70–99)
Phosphorus: 2.8 mg/dL (ref 2.3–4.6)
Potassium: 4.6 mEq/L (ref 3.5–5.1)
Sodium: 140 mEq/L (ref 135–145)

## 2013-09-25 LAB — LIPID PANEL
Cholesterol: 184 mg/dL (ref 0–200)
HDL: 32.9 mg/dL — ABNORMAL LOW (ref >39.00)
LDL CALC: 127 mg/dL — AB (ref 0–99)
Total CHOL/HDL Ratio: 6
Triglycerides: 121 mg/dL (ref 0.0–149.0)
VLDL: 24.2 mg/dL (ref 0.0–40.0)

## 2013-09-25 LAB — PSA: PSA: 1.88 ng/mL (ref 0.10–4.00)

## 2013-10-01 ENCOUNTER — Ambulatory Visit (INDEPENDENT_AMBULATORY_CARE_PROVIDER_SITE_OTHER): Payer: BC Managed Care – PPO | Admitting: Family Medicine

## 2013-10-01 ENCOUNTER — Encounter: Payer: Self-pay | Admitting: Family Medicine

## 2013-10-01 VITALS — BP 128/74 | HR 74 | Temp 97.9°F | Ht 70.0 in | Wt 211.5 lb

## 2013-10-01 DIAGNOSIS — I10 Essential (primary) hypertension: Secondary | ICD-10-CM

## 2013-10-01 DIAGNOSIS — Z0000 Encounter for general adult medical examination without abnormal findings: Secondary | ICD-10-CM

## 2013-10-01 DIAGNOSIS — N433 Hydrocele, unspecified: Secondary | ICD-10-CM

## 2013-10-01 DIAGNOSIS — C649 Malignant neoplasm of unspecified kidney, except renal pelvis: Secondary | ICD-10-CM

## 2013-10-01 DIAGNOSIS — N182 Chronic kidney disease, stage 2 (mild): Secondary | ICD-10-CM

## 2013-10-01 MED ORDER — SILDENAFIL CITRATE 100 MG PO TABS: 50.0000 mg | ORAL_TABLET | ORAL | Status: DC | PRN

## 2013-10-01 MED ORDER — TELMISARTAN 40 MG PO TABS: ORAL_TABLET | ORAL | Status: DC

## 2013-10-01 MED ORDER — FLUTICASONE PROPIONATE 50 MCG/ACT NA SUSP: NASAL | Status: DC

## 2013-10-01 NOTE — Assessment & Plan Note (Signed)
Reviewed importance of hydration status and need to avoid NSAIDs. Pt aware.

## 2013-10-01 NOTE — Progress Notes (Signed)
BP 128/74  Pulse 74  Temp(Src) 97.9 F (36.6 C) (Oral)  Ht 5\' 10"  (1.778 m)  Wt 211 lb 8 oz (95.936 kg)  BMI 30.35 kg/m2  SpO2 96%   CC: annual exam  Subjective:    Patient ID: Aaron Cole, male    DOB: 13-Nov-1950, 63 y.o.   MRN: [id number]  HPI: Aaron Cole is a 63 y.o. male presenting on 10/01/2013 for Annual Exam   Sees urologist once a year [facility]). H/o R RCC s/p R nephrectomy.  Has seen Dr. Jacqlyn Larsen in past.  H/o R hydrocele - becoming tender and would like urology eval to discuss removal. Requests refill of viagra - never requested lower dose that was pharmacy's initiative.  Desires viagra.   Wt Readings from Last 3 Encounters:  10/01/13 211 lb 8 oz (95.936 kg)  10/02/12 208 lb 8 oz (94.575 kg)  09/10/12 210 lb 12 oz (95.596 kg)  Body mass index is 30.35 kg/(m^2).  Preventative: Colonoscopy 10/2009 - told normal, rec rpt 8 yrs.  Prostate cancer screening - will continue screening.  Flu -not done last year Tetanus - 08/2012 Seat belt use discussed. Sunscreen use discussed.  Caffeine: occasional  Lives alone, 1 cat  Occupation: farming business  Edu: BS  Activity: bikes some, about 1 hour  Diet: good water, fruits/vegetables daily, steak 1x/wk, fish 2x/wk   Relevant past medical, surgical, family and social history reviewed and updated as indicated.  Allergies and medications reviewed and updated. No current outpatient prescriptions on file prior to visit.   No current facility-administered medications on file prior to visit.    Review of Systems  Constitutional: Negative for fever, chills, activity change, appetite change, fatigue and unexpected weight change.  HENT: Negative for hearing loss.   Eyes: Negative for visual disturbance.  Respiratory: Negative for cough, chest tightness, shortness of breath and wheezing.   Cardiovascular: Negative for chest pain, palpitations and leg swelling.  Gastrointestinal: Negative for nausea, vomiting, abdominal pain,  diarrhea, constipation, blood in stool and abdominal distention.  Genitourinary: Negative for hematuria and difficulty urinating.  Musculoskeletal: Negative for arthralgias, myalgias and neck pain.  Skin: Negative for rash.  Neurological: Negative for dizziness, seizures, syncope and headaches.  Hematological: Negative for adenopathy. Does not bruise/bleed easily.  Psychiatric/Behavioral: Negative for dysphoric mood. The patient is not nervous/anxious.    Per HPI unless specifically indicated above    Objective:    BP 128/74  Pulse 74  Temp(Src) 97.9 F (36.6 C) (Oral)  Ht 5\' 10"  (1.778 m)  Wt 211 lb 8 oz (95.936 kg)  BMI 30.35 kg/m2  SpO2 96%  Physical Exam  Nursing note and vitals reviewed. Constitutional: He is oriented to person, place, and time. He appears well-developed and well-nourished. No distress.  HENT:  Head: Normocephalic and atraumatic.  Right Ear: Hearing, tympanic membrane, external ear and ear canal normal.  Left Ear: Hearing, tympanic membrane, external ear and ear canal normal.  Nose: Nose normal.  Mouth/Throat: Uvula is midline, oropharynx is clear and moist and mucous membranes are normal. No oropharyngeal exudate, posterior oropharyngeal edema or posterior oropharyngeal erythema.  Eyes: Conjunctivae and EOM are normal. Pupils are equal, round, and reactive to light. No scleral icterus.  Neck: Normal range of motion. Neck supple. No thyromegaly present.  Cardiovascular: Normal rate, regular rhythm, normal heart sounds and intact distal pulses.   No murmur heard. Pulses:      Radial pulses are 2+ on the right side, and 2+ on  the left side.  Pulmonary/Chest: Effort normal and breath sounds normal. No respiratory distress. He has no wheezes. He has no rales.  Abdominal: Soft. Bowel sounds are normal. He exhibits no distension and no mass. There is no tenderness. There is no rebound and no guarding.  Genitourinary: Rectum normal and prostate normal. Rectal exam  shows no external hemorrhoid, no fissure, no mass, no tenderness and anal tone normal. Prostate is not enlarged (15gm) and not tender. Right testis shows swelling.  Large R testicular hydrocele.  Musculoskeletal: Normal range of motion. He exhibits no edema.  Lymphadenopathy:    He has no cervical adenopathy.  Neurological: He is alert and oriented to person, place, and time.  CN grossly intact, station and gait intact  Skin: Skin is warm and dry. No rash noted.  Psychiatric: He has a normal mood and affect. His behavior is normal. Judgment and thought content normal.   Results for orders placed in visit on 09/25/13  RENAL FUNCTION PANEL      Result Value Ref Range   Sodium 140  135 - 145 mEq/L   Potassium 4.6  3.5 - 5.1 mEq/L   Chloride 108  96 - 112 mEq/L   CO2 24  19 - 32 mEq/L   Calcium 9.6  8.4 - 10.5 mg/dL   Albumin 4.1  3.5 - 5.2 g/dL   BUN 21  6 - 23 mg/dL   Creatinine, Ser 1.4  0.4 - 1.5 mg/dL   Glucose, Bld 96  70 - 99 mg/dL   Phosphorus 2.8  2.3 - 4.6 mg/dL   GFR 56.61 (*) >60.00 mL/min  LIPID PANEL      Result Value Ref Range   Cholesterol 184  0 - 200 mg/dL   Triglycerides 121.0  0.0 - 149.0 mg/dL   HDL 32.90 (*) >39.00 mg/dL   VLDL 24.2  0.0 - 40.0 mg/dL   LDL Cholesterol 127 (*) 0 - 99 mg/dL   Total CHOL/HDL Ratio 6    PSA      Result Value Ref Range   PSA 1.88  0.10 - 4.00 ng/mL      Assessment & Plan:   Problem List Items Addressed This Visit   HTN (hypertension)     Great control today, continue med.  Refilled today.    Relevant Medications      sildenafil (VIAGRA) tablet      telmisartan (MICARDIS) tablet   Renal cell carcinoma     Overall stable per [facility] urology. Will continue to f/u with them for 5 years.    Routine health maintenance - Primary     Preventative protocols reviewed and updated unless pt declined. Discussed healthy diet and lifestyle.  DRE/PSA reassuring.    Right hydrocele     Refer to urology per pt request as somewhat  symptomatic.    Relevant Orders      Ambulatory referral to Urology   CKD (chronic kidney disease) stage 2, GFR 60-89 ml/min     Reviewed importance of hydration status and need to avoid NSAIDs. Pt aware.        Follow up plan: No Follow-up on file.

## 2013-10-01 NOTE — Assessment & Plan Note (Signed)
Refer to urology per pt request as somewhat symptomatic.

## 2013-10-01 NOTE — Progress Notes (Signed)
Pre visit review using our clinic review tool, if applicable. No additional management support is needed unless otherwise documented below in the visit note. 

## 2013-10-01 NOTE — Assessment & Plan Note (Signed)
Preventative protocols reviewed and updated unless pt declined. Discussed healthy diet and lifestyle.  DRE/PSA reassuring 

## 2013-10-01 NOTE — Patient Instructions (Signed)
Pass by Marion's office for referral back to Dr. Jacqlyn Larsen Work on low cholesterol diet (handout provided). Return as needed or in 1 year for next physical. Good to see you today, call us with hquesitons.

## 2013-10-01 NOTE — Assessment & Plan Note (Signed)
Great control today, continue med.  Refilled today.

## 2013-10-01 NOTE — Assessment & Plan Note (Signed)
Overall stable per [facility] urology. Will continue to f/u with them for 5 years.

## 2013-10-05 ENCOUNTER — Telehealth: Payer: Self-pay | Admitting: Family Medicine

## 2013-10-05 NOTE — Telephone Encounter (Signed)
Relevant patient education mailed to patient.  

## 2013-10-23 DIAGNOSIS — N4340 Spermatocele of epididymis, unspecified: Secondary | ICD-10-CM | POA: Insufficient documentation

## 2013-12-17 ENCOUNTER — Encounter: Payer: Self-pay | Admitting: Family Medicine

## 2013-12-17 ENCOUNTER — Other Ambulatory Visit: Payer: Self-pay | Admitting: Family Medicine

## 2013-12-28 ENCOUNTER — Telehealth: Payer: Self-pay

## 2013-12-28 DIAGNOSIS — N182 Chronic kidney disease, stage 2 (mild): Secondary | ICD-10-CM

## 2013-12-28 NOTE — Telephone Encounter (Signed)
Pt left v/m; pts nephrologist request micardis 40 mg increased to 1 tab twice a day. [place]. Unable to reach pt for further info.

## 2013-12-29 MED ORDER — TELMISARTAN 40 MG PO TABS: ORAL_TABLET | ORAL | Status: DC

## 2013-12-29 NOTE — Telephone Encounter (Signed)
Will increase micardis to 40mg  bid. plz advise pt needs to stay well hydrated with increase, rec return in 1 wk for repeat Cr. Can we get copy of renal note? Thanks.

## 2013-12-30 NOTE — Telephone Encounter (Signed)
Patient notified and lab appt scheduled. Records requested.

## 2014-01-07 ENCOUNTER — Other Ambulatory Visit: Payer: BC Managed Care – PPO

## 2014-01-13 ENCOUNTER — Other Ambulatory Visit (INDEPENDENT_AMBULATORY_CARE_PROVIDER_SITE_OTHER): Payer: BC Managed Care – PPO

## 2014-01-13 DIAGNOSIS — N182 Chronic kidney disease, stage 2 (mild): Secondary | ICD-10-CM

## 2014-01-13 LAB — RENAL FUNCTION PANEL
ALBUMIN: 3.9 g/dL (ref 3.5–5.2)
BUN: 23 mg/dL (ref 6–23)
CO2: 27 mEq/L (ref 19–32)
CREATININE: 1.3 mg/dL (ref 0.4–1.5)
Calcium: 9.3 mg/dL (ref 8.4–10.5)
Chloride: 109 mEq/L (ref 96–112)
GFR: 58.02 mL/min — ABNORMAL LOW (ref >60.00)
Glucose, Bld: 104 mg/dL — ABNORMAL HIGH (ref 70–99)
PHOSPHORUS: 2.7 mg/dL (ref 2.3–4.6)
POTASSIUM: 4.1 meq/L (ref 3.5–5.1)
Sodium: 139 mEq/L (ref 135–145)

## 2014-01-14 ENCOUNTER — Encounter: Payer: Self-pay | Admitting: *Deleted

## 2014-01-21 ENCOUNTER — Encounter: Payer: Self-pay | Admitting: Family Medicine

## 2014-01-21 DIAGNOSIS — N183 Chronic kidney disease, stage 3 unspecified: Secondary | ICD-10-CM | POA: Insufficient documentation

## 2014-03-30 ENCOUNTER — Other Ambulatory Visit: Payer: Self-pay | Admitting: Family Medicine

## 2014-03-30 ENCOUNTER — Other Ambulatory Visit (INDEPENDENT_AMBULATORY_CARE_PROVIDER_SITE_OTHER): Payer: BC Managed Care – PPO

## 2014-03-30 DIAGNOSIS — N183 Chronic kidney disease, stage 3 unspecified: Secondary | ICD-10-CM

## 2014-03-30 DIAGNOSIS — I10 Essential (primary) hypertension: Secondary | ICD-10-CM

## 2014-04-05 DIAGNOSIS — Z9889 Other specified postprocedural states: Secondary | ICD-10-CM | POA: Insufficient documentation

## 2014-12-03 ENCOUNTER — Telehealth: Payer: Self-pay | Admitting: Family Medicine

## 2014-12-03 NOTE — Telephone Encounter (Signed)
Pt dropped off letter from [facility].  He needs a letter stating his bp med is necessary to take  He needs the letter before 12/31/14. Letter on kim's desk

## 2014-12-03 NOTE — Telephone Encounter (Signed)
In your IN box.

## 2014-12-14 ENCOUNTER — Other Ambulatory Visit: Payer: Self-pay | Admitting: Family Medicine

## 2014-12-14 DIAGNOSIS — I10 Essential (primary) hypertension: Secondary | ICD-10-CM

## 2014-12-14 DIAGNOSIS — N183 Chronic kidney disease, stage 3 unspecified: Secondary | ICD-10-CM

## 2014-12-14 DIAGNOSIS — Z125 Encounter for screening for malignant neoplasm of prostate: Secondary | ICD-10-CM

## 2014-12-14 NOTE — Telephone Encounter (Signed)
Letter prepared and in Kim's box. I have written letter for micardis - but insurance may deny it if he hasn't tried generic alternatives in this family first (losartan). If that is the case, kidney doctor will need to write letter for exception.

## 2014-12-14 NOTE — Telephone Encounter (Signed)
Patient notified. However the letter he received and gave to [place], had no address or fax # to send our letter to. He will call and find out where to send it and let me know.

## 2014-12-15 ENCOUNTER — Other Ambulatory Visit (INDEPENDENT_AMBULATORY_CARE_PROVIDER_SITE_OTHER): Payer: BLUE CROSS/BLUE SHIELD

## 2014-12-15 DIAGNOSIS — I10 Essential (primary) hypertension: Secondary | ICD-10-CM

## 2014-12-15 DIAGNOSIS — Z125 Encounter for screening for malignant neoplasm of prostate: Secondary | ICD-10-CM

## 2014-12-15 DIAGNOSIS — N183 Chronic kidney disease, stage 3 unspecified: Secondary | ICD-10-CM

## 2014-12-15 LAB — RENAL FUNCTION PANEL
Albumin: 4.1 g/dL (ref 3.5–5.2)
BUN: 24 mg/dL — AB (ref 6–23)
CO2: 26 mEq/L (ref 19–32)
CREATININE: 1.17 mg/dL (ref 0.40–1.50)
Calcium: 9.4 mg/dL (ref 8.4–10.5)
Chloride: 107 mEq/L (ref 96–112)
GFR: 67.07 mL/min (ref >60.00)
Glucose, Bld: 125 mg/dL — ABNORMAL HIGH (ref 70–99)
PHOSPHORUS: 3 mg/dL (ref 2.3–4.6)
Potassium: 4.3 mEq/L (ref 3.5–5.1)
Sodium: 138 mEq/L (ref 135–145)

## 2014-12-15 LAB — CBC WITH DIFFERENTIAL/PLATELET
Basophils Absolute: 0 10*3/uL (ref 0.0–0.1)
Basophils Relative: 0.5 % (ref 0.0–3.0)
EOS PCT: 0.5 % (ref 0.0–5.0)
Eosinophils Absolute: 0 10*3/uL (ref 0.0–0.7)
HCT: 43.3 % (ref 39.0–52.0)
HEMOGLOBIN: 14.6 g/dL (ref 13.0–17.0)
LYMPHS ABS: 2.7 10*3/uL (ref 0.7–4.0)
LYMPHS PCT: 44.4 % (ref 12.0–46.0)
MCHC: 33.7 g/dL (ref 30.0–36.0)
MCV: 88.8 fl (ref 78.0–100.0)
MONOS PCT: 7.8 % (ref 3.0–12.0)
Monocytes Absolute: 0.5 10*3/uL (ref 0.1–1.0)
NEUTROS PCT: 46.8 % (ref 43.0–77.0)
Neutro Abs: 2.8 10*3/uL (ref 1.4–7.7)
PLATELETS: 195 10*3/uL (ref 150.0–400.0)
RBC: 4.88 Mil/uL (ref 4.22–5.81)
RDW: 14.2 % (ref 11.5–15.5)
WBC: 6.1 10*3/uL (ref 4.0–10.5)

## 2014-12-15 LAB — LIPID PANEL
Cholesterol: 185 mg/dL (ref 0–200)
HDL: 33.3 mg/dL — AB (ref >39.00)
LDL CALC: 120 mg/dL — AB (ref 0–99)
NonHDL: 151.7
TRIGLYCERIDES: 157 mg/dL — AB (ref 0.0–149.0)
Total CHOL/HDL Ratio: 6
VLDL: 31.4 mg/dL (ref 0.0–40.0)

## 2014-12-15 LAB — PSA: PSA: 1.42 ng/mL (ref 0.10–4.00)

## 2014-12-15 LAB — VITAMIN D 25 HYDROXY (VIT D DEFICIENCY, FRACTURES): VITD: 25.17 ng/mL — AB (ref 30.00–100.00)

## 2014-12-16 NOTE — Telephone Encounter (Signed)
Patient called with address to mail letter: [name] [id number]            [place] [address]  Letter mailed and copy faxed to patient as requested to [phone]

## 2014-12-20 ENCOUNTER — Encounter: Payer: Self-pay | Admitting: Family Medicine

## 2014-12-23 ENCOUNTER — Encounter: Payer: Self-pay | Admitting: Family Medicine

## 2014-12-23 ENCOUNTER — Ambulatory Visit (INDEPENDENT_AMBULATORY_CARE_PROVIDER_SITE_OTHER): Payer: BLUE CROSS/BLUE SHIELD | Admitting: Family Medicine

## 2014-12-23 VITALS — BP 120/80 | HR 65 | Temp 98.4°F | Ht 70.0 in | Wt 214.0 lb

## 2014-12-23 DIAGNOSIS — N183 Chronic kidney disease, stage 3 unspecified: Secondary | ICD-10-CM

## 2014-12-23 DIAGNOSIS — C641 Malignant neoplasm of right kidney, except renal pelvis: Secondary | ICD-10-CM

## 2014-12-23 DIAGNOSIS — Z0000 Encounter for general adult medical examination without abnormal findings: Secondary | ICD-10-CM

## 2014-12-23 DIAGNOSIS — I10 Essential (primary) hypertension: Secondary | ICD-10-CM

## 2014-12-23 NOTE — Assessment & Plan Note (Signed)
Improved #s based on recent check. Continues regular f/u with [facility] nephrology.

## 2014-12-23 NOTE — Patient Instructions (Addendum)
Sign release of information for colonoscopy from [place] up front.  You are doing well today.  Return as needed or in 1 year for next physical.  Good to see you today, call us with questions.

## 2014-12-23 NOTE — Assessment & Plan Note (Signed)
Stable. Followed by [facility] urology Dr Jacqlyn Larsen.

## 2014-12-23 NOTE — Assessment & Plan Note (Signed)
Preventative protocols reviewed and updated unless pt declined. Discussed healthy diet and lifestyle.  

## 2014-12-23 NOTE — Progress Notes (Signed)
Pre visit review using our clinic review tool, if applicable. No additional management support is needed unless otherwise documented below in the visit note. 

## 2014-12-23 NOTE — Assessment & Plan Note (Signed)
Chronic, stable on micardis. Awaiting to hear back from insurance on approval. I and nephrologist have written letters requesting this med be covered.

## 2014-12-23 NOTE — Progress Notes (Signed)
BP 120/80 mmHg  Pulse 65  Temp(Src) 98.4 F (36.9 C) (Tympanic)  Ht 5\' 10"  (1.778 m)  Wt 214 lb (97.07 kg)  BMI 30.71 kg/m2  SpO2 98%   CC: CPE  Subjective:    Patient ID: Aaron Cole, male    DOB: February 27, 1951, 64 y.o.   MRN: [id number]  HPI: Aaron Cole is a 64 y.o. male presenting on 12/23/2014 for Annual Exam   Sees urologist Dr Jacqlyn Larsen and nephrologist Dr Mathis Fare once daily. H/o R RCC s/p R nephrectomy.recently started on MVI QOD for vit D deficiency.  Preventative: Colonoscopy 10/2009 - told normal, rec rpt 8 yrs.  Prostate cancer screening - will continue screening.  Flu - not done last year Tetanus - 08/2012 Seat belt use discussed.  Sunscreen use discussed.   Caffeine: occasional  Lives alone, 1 cat  Occupation: farming business  Edu: BS  Activity: stays active on farm. bikes some, about 1 hour. Diet: good water, fruits/vegetables daily, steak 1x/wk, fish 2x/wk  Relevant past medical, surgical, family and social history reviewed and updated as indicated. Interim medical history since our last visit reviewed. Allergies and medications reviewed and updated. Current Outpatient Prescriptions on File Prior to Visit  Medication Sig  . fluticasone (FLONASE) 50 MCG/ACT nasal spray USE TWO SPRAYS IN EACH NOSTRIL DAILY  . Omega-3 Fatty Acids (FISH OIL) 1000 MG CAPS Take 1 capsule by mouth daily.  . sildenafil (VIAGRA) 100 MG tablet Take 0.5-1 tablets (50-100 mg total) by mouth as needed.  Marland Kitchen telmisartan (MICARDIS) 40 MG tablet TAKE 1 TABLET BY MOUTH TWICE DAILY   No current facility-administered medications on file prior to visit.    Review of Systems  Constitutional: Negative for fever, chills, activity change, appetite change, fatigue and unexpected weight change.  HENT: Negative for hearing loss.   Eyes: Negative for visual disturbance.  Respiratory: Negative for cough, chest tightness, shortness of breath and wheezing.   Cardiovascular: Negative for chest  pain, palpitations and leg swelling.  Gastrointestinal: Negative for nausea, vomiting, abdominal pain, diarrhea, constipation, blood in stool and abdominal distention.  Genitourinary: Negative for hematuria and difficulty urinating.  Musculoskeletal: Negative for myalgias, arthralgias and neck pain.  Skin: Negative for rash.  Neurological: Negative for dizziness, seizures, syncope and headaches.  Hematological: Negative for adenopathy. Does not bruise/bleed easily.  Psychiatric/Behavioral: Negative for dysphoric mood. The patient is not nervous/anxious.    Per HPI unless specifically indicated above     Objective:    BP 120/80 mmHg  Pulse 65  Temp(Src) 98.4 F (36.9 C) (Tympanic)  Ht 5\' 10"  (1.778 m)  Wt 214 lb (97.07 kg)  BMI 30.71 kg/m2  SpO2 98%  Wt Readings from Last 3 Encounters:  12/23/14 214 lb (97.07 kg)  10/01/13 211 lb 8 oz (95.936 kg)  10/02/12 208 lb 8 oz (94.575 kg)    Physical Exam  Constitutional: He is oriented to person, place, and time. He appears well-developed and well-nourished. No distress.  HENT:  Head: Normocephalic and atraumatic.  Right Ear: Hearing, tympanic membrane, external ear and ear canal normal.  Left Ear: Hearing, tympanic membrane, external ear and ear canal normal.  Nose: Nose normal.  Mouth/Throat: Uvula is midline, oropharynx is clear and moist and mucous membranes are normal. No oropharyngeal exudate, posterior oropharyngeal edema or posterior oropharyngeal erythema.  Eyes: Conjunctivae and EOM are normal. Pupils are equal, round, and reactive to light. No scleral icterus.  Neck: Normal range of motion. Neck supple. No thyromegaly present.  Cardiovascular: Normal rate, regular rhythm, normal heart sounds and intact distal pulses.   No murmur heard. Pulses:      Radial pulses are 2+ on the right side, and 2+ on the left side.  Pulmonary/Chest: Effort normal and breath sounds normal. No respiratory distress. He has no wheezes. He has no  rales.  Abdominal: Soft. Bowel sounds are normal. He exhibits no distension and no mass. There is no tenderness. There is no rebound and no guarding.  Genitourinary: Rectum normal and prostate normal. Rectal exam shows no external hemorrhoid, no internal hemorrhoid, no fissure, no mass, no tenderness and anal tone normal. Prostate is not enlarged and not tender.  Musculoskeletal: Normal range of motion. He exhibits no edema.  Lymphadenopathy:    He has no cervical adenopathy.  Neurological: He is alert and oriented to person, place, and time.  CN grossly intact, station and gait intact  Skin: Skin is warm and dry. No rash noted.  Psychiatric: He has a normal mood and affect. His behavior is normal. Judgment and thought content normal.  Nursing note and vitals reviewed.  Results for orders placed or performed in visit on 12/15/14  Renal function panel  Result Value Ref Range   Sodium 138 135 - 145 mEq/L   Potassium 4.3 3.5 - 5.1 mEq/L   Chloride 107 96 - 112 mEq/L   CO2 26 19 - 32 mEq/L   Calcium 9.4 8.4 - 10.5 mg/dL   Albumin 4.1 3.5 - 5.2 g/dL   BUN 24 (H) 6 - 23 mg/dL   Creatinine, Ser 1.17 0.40 - 1.50 mg/dL   Glucose, Bld 125 (H) 70 - 99 mg/dL   Phosphorus 3.0 2.3 - 4.6 mg/dL   GFR 67.07 >60.00 mL/min  Lipid panel  Result Value Ref Range   Cholesterol 185 0 - 200 mg/dL   Triglycerides 157.0 (H) 0.0 - 149.0 mg/dL   HDL 33.30 (L) >39.00 mg/dL   VLDL 31.4 0.0 - 40.0 mg/dL   LDL Cholesterol 120 (H) 0 - 99 mg/dL   Total CHOL/HDL Ratio 6    NonHDL 151.70   PSA  Result Value Ref Range   PSA 1.42 0.10 - 4.00 ng/mL  CBC with Differential/Platelet  Result Value Ref Range   WBC 6.1 4.0 - 10.5 K/uL   RBC 4.88 4.22 - 5.81 Mil/uL   Hemoglobin 14.6 13.0 - 17.0 g/dL   HCT 43.3 39.0 - 52.0 %   MCV 88.8 78.0 - 100.0 fl   MCHC 33.7 30.0 - 36.0 g/dL   RDW 14.2 11.5 - 15.5 %   Platelets 195.0 150.0 - 400.0 K/uL   Neutrophils Relative % 46.8 43.0 - 77.0 %   Lymphocytes Relative 44.4  12.0 - 46.0 %   Monocytes Relative 7.8 3.0 - 12.0 %   Eosinophils Relative 0.5 0.0 - 5.0 %   Basophils Relative 0.5 0.0 - 3.0 %   Neutro Abs 2.8 1.4 - 7.7 K/uL   Lymphs Abs 2.7 0.7 - 4.0 K/uL   Monocytes Absolute 0.5 0.1 - 1.0 K/uL   Eosinophils Absolute 0.0 0.0 - 0.7 K/uL   Basophils Absolute 0.0 0.0 - 0.1 K/uL  Vit D  25 hydroxy (rtn osteoporosis monitoring)  Result Value Ref Range   VITD 25.17 (L) 30.00 - 100.00 ng/mL      Assessment & Plan:   Problem List Items Addressed This Visit    CKD (chronic kidney disease) stage 3, GFR 30-59 ml/min    Improved #s based on recent  check. Continues regular f/u with [facility] nephrology.      HTN (hypertension)    Chronic, stable on micardis. Awaiting to hear back from insurance on approval. I and nephrologist have written letters requesting this med be covered.      Renal cell carcinoma    Stable. Followed by [facility] urology Dr Jacqlyn Larsen.      Routine health maintenance - Primary    Preventative protocols reviewed and updated unless pt declined. Discussed healthy diet and lifestyle.           Follow up plan: Return in about 1 year (around 12/23/2015), or as needed, for annual exam, prior fasting for blood work.

## 2015-05-05 ENCOUNTER — Encounter: Payer: Self-pay | Admitting: Family Medicine

## 2015-05-05 ENCOUNTER — Ambulatory Visit (INDEPENDENT_AMBULATORY_CARE_PROVIDER_SITE_OTHER): Payer: BLUE CROSS/BLUE SHIELD | Admitting: Family Medicine

## 2015-05-05 ENCOUNTER — Other Ambulatory Visit: Payer: Self-pay | Admitting: Family Medicine

## 2015-05-05 VITALS — BP 124/84 | HR 64 | Temp 98.8°F | Wt 221.5 lb

## 2015-05-05 DIAGNOSIS — I10 Essential (primary) hypertension: Secondary | ICD-10-CM | POA: Diagnosis not present

## 2015-05-05 DIAGNOSIS — E785 Hyperlipidemia, unspecified: Secondary | ICD-10-CM | POA: Insufficient documentation

## 2015-05-05 DIAGNOSIS — Z23 Encounter for immunization: Secondary | ICD-10-CM | POA: Diagnosis not present

## 2015-05-05 DIAGNOSIS — I712 Thoracic aortic aneurysm, without rupture: Secondary | ICD-10-CM

## 2015-05-05 DIAGNOSIS — I7121 Aneurysm of the ascending aorta, without rupture: Secondary | ICD-10-CM | POA: Insufficient documentation

## 2015-05-05 DIAGNOSIS — J0110 Acute frontal sinusitis, unspecified: Secondary | ICD-10-CM | POA: Diagnosis not present

## 2015-05-05 MED ORDER — AMOXICILLIN-POT CLAVULANATE 875-125 MG PO TABS: 1.0000 | ORAL_TABLET | Freq: Two times a day (BID) | ORAL | Status: AC

## 2015-05-05 NOTE — Progress Notes (Signed)
Pre visit review using our clinic review tool, if applicable. No additional management support is needed unless otherwise documented below in the visit note. 

## 2015-05-05 NOTE — Assessment & Plan Note (Addendum)
Reviewed imaging studies from [facility] on care everywhere - will start with echocardiogram - ordered today. Discussed possible CVTS referral pending results of echo. I also recommended avoiding heavy exertion until echo and further eval. Discussed importance of good blood pressure control. Discussed red flags to monitor for.

## 2015-05-05 NOTE — Assessment & Plan Note (Signed)
Chronic, stable. Continue micardis.  

## 2015-05-05 NOTE — Progress Notes (Signed)
BP 124/84 mmHg  Pulse 64  Temp(Src) 98.8 F (37.1 C) (Oral)  Wt 221 lb 8 oz (100.472 kg)   CC: f/u visit  Subjective:    Patient ID: Aaron Cole, male    DOB: Nov 10, 1950, 64 y.o.   MRN: [id number]  HPI: Aaron Cole is a 64 y.o. male presenting on 05/05/2015 for Follow-up   I received a call earlier this week from Dr Celso Sickle [facility] urology about incidental finding on CXR of aneurysmal ascending aorta rec echocardiogram. Reviewed records from [place] - see below. No prior CXR in our records.  No prior known h/o aortic issues. No h/o trauma. No chest pain, dyspnea, palpitations, dizziness or syncope. Remote h/o murmur.   Strong fmhx heart disease maternal side (MI and CHF). No known aortic issues.   He does stay worried about heavy physical work. 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   4d h/o R periorbital pain along with sinus congestion. + pressure headache, coughing, sneezing and PNdrainage. No fevers/chills, no ear or tooth pain. Consistent with flonase.   Relevant past medical, surgical, family and social history reviewed and updated as indicated. Interim medical history since our last visit reviewed. Allergies and medications reviewed and updated. Current Outpatient Prescriptions on File Prior to Visit  Medication Sig  . fluticasone (FLONASE) 50 MCG/ACT nasal spray USE TWO SPRAYS IN EACH NOSTRIL DAILY  . Multiple Vitamins-Minerals (MULTIVITAMIN ADULT PO) Take 1 tablet by mouth every other day.  . Omega-3 Fatty Acids (FISH OIL) 1000 MG CAPS Take 1 capsule by mouth daily.  . sildenafil (VIAGRA) 100 MG tablet Take 0.5-1 tablets (50-100 mg total) by mouth as needed.  Marland Kitchen telmisartan (MICARDIS) 40 MG tablet TAKE 1 TABLET BY MOUTH TWICE DAILY   No current facility-administered medications on file prior to visit.   Past Medical History  Diagnosis Date  . Sinus headache     recurrent  . HTN (hypertension)   . Renal cell carcinoma ([place])  2013    clear cell s/p R nephrectomy  . S/p nephrectomy 2013    [facility]  . CAD (coronary artery disease) 2013    per CT scan  - brings CD of CT, no report - will need [facility] records of CT scan report  . Diverticulosis     by CT scan  . CKD (chronic kidney disease) stage 3, GFR 30-59 ml/min 2015    iatrogenic (decreased nephron mass) established with Dr. Alfonse Alpers [facility])    Past Surgical History  Procedure Laterality Date  . Nasal sinus surgery  1978    sinus surgery done at [facility]  . Rectal fistula repair  1980s  . Nephrectomy  12/2011    [facility], R for RCC  . Colonoscopy  2011    per pt normal    Review of Systems Per HPI unless specifically indicated in ROS section     Objective:    BP 124/84 mmHg  Pulse 64  Temp(Src) 98.8 F (37.1 C) (Oral)  Wt 221 lb 8 oz (100.472 kg)  Wt Readings from Last 3 Encounters:  05/05/15 221 lb 8 oz (100.472 kg)  12/23/14 214 lb (97.07 kg)  10/01/13 211 lb 8 oz (95.936 kg)    Physical Exam  Constitutional: He appears well-developed and well-nourished. No distress.  HENT:  Head: Normocephalic and atraumatic.  Right Ear: Hearing, tympanic membrane, external ear and ear canal normal.  Left Ear: Hearing, tympanic membrane, external ear and ear  canal normal.  Nose: No mucosal edema or rhinorrhea. Right sinus exhibits maxillary sinus tenderness and frontal sinus tenderness. Left sinus exhibits no maxillary sinus tenderness and no frontal sinus tenderness.  Mouth/Throat: Uvula is midline, oropharynx is clear and moist and mucous membranes are normal. No oropharyngeal exudate, posterior oropharyngeal edema, posterior oropharyngeal erythema or tonsillar abscesses.  Cerumen bilaterally  Eyes: Conjunctivae and EOM are normal. Pupils are equal, round, and reactive to light. No scleral icterus.  Neck: Normal range of motion. Neck supple. Carotid bruit is not present. No thyromegaly present.  Cardiovascular: Normal rate, regular rhythm and intact distal pulses.     Murmur (faint systolic best at RUSB) heard. Pulmonary/Chest: Effort normal and breath sounds normal. No respiratory distress. He has no wheezes. He has no rales.  Lymphadenopathy:    He has no cervical adenopathy.  Skin: Skin is warm and dry. No rash noted.  Nursing note and vitals reviewed.   EXAM: CHEST TWO VIEW  DATE: 05/02/15 15:48:26  ACCESSION: [id number]  DICTATED: 05/02/15 16:46:16  INTERPRETATION LOCATION: [place]  CLINICAL INDICATION: 64 Year Old (M): C64.1 - Renal cell carcinoma, right (RAF-HCC).   COMPARISON: Chest radiographs 11/05/2013.  TECHNIQUE:Upright PA and lateral views of the chest.  FINDINGS:  Normal lung volumes.   No focal consolidation. No pleural effusion or pneumothorax. No pulmonary edema.   Cardiomediastinal silhouette is within normal limits.  No acute osseous abnormality.  IMPRESSION:  Clear lungs.  ATTENDING ADDENDUM (Dr. Kandra Nicolas dictating): Aneurysmal ascending aorta, increased compared to previous film. Recommend correlation with echocardiogram to exclude aortic valvular pathology.      Assessment & Plan:   Problem List Items Addressed This Visit    HTN (hypertension)    Chronic, stable. Continue micardis.      Relevant Medications   aspirin 81 MG tablet   HLD (hyperlipidemia)    Mild. On aspirin, not on statin. Consider starting this at future visit.      Relevant Medications   aspirin 81 MG tablet   Aneurysm, ascending aorta ([place]) - Primary    Reviewed imaging studies from [facility] on care everywhere - will start with echocardiogram - ordered today. Discussed possible CVTS referral pending results of echo. I also recommended avoiding heavy exertion until echo and further eval. Discussed importance of good blood pressure control. Discussed red flags to monitor for.      Relevant Medications   aspirin 81 MG tablet   Other Relevant Orders   Echocardiogram   Acute frontal sinusitis    Discussed likely viral given short  duration. Discussed reasons to start abx - prolonged sxs past 7-10 d, fever >101, worsening cough or headache/sinus pressure. Provided with WASP for augmentin.      Relevant Medications   amoxicillin-clavulanate (AUGMENTIN) 875-125 MG tablet    Other Visit Diagnoses    Need for influenza vaccination        Relevant Orders    Flu Vaccine QUAD 36+ mos PF IM (Fluarix & Fluzone Quad PF) (Completed)        Follow up plan: Return if symptoms worsen or fail to improve.

## 2015-05-05 NOTE — Assessment & Plan Note (Signed)
Discussed likely viral given short duration. Discussed reasons to start abx - prolonged sxs past 7-10 d, fever >101, worsening cough or headache/sinus pressure. Provided with WASP for augmentin.

## 2015-05-05 NOTE — Assessment & Plan Note (Signed)
Mild. On aspirin, not on statin. Consider starting this at future visit.

## 2015-05-05 NOTE — Patient Instructions (Addendum)
Flu shot today Pass by [facility]'s office for echocardiogram scheduling. You do have sinus infection but likely viral. Push fluids and rest. If fever >101, worsening pressure/pain, or prolonged symptoms past 10 days, fill augmentin antibiotic.

## 2015-06-02 ENCOUNTER — Ambulatory Visit (INDEPENDENT_AMBULATORY_CARE_PROVIDER_SITE_OTHER): Payer: BLUE CROSS/BLUE SHIELD

## 2015-06-02 ENCOUNTER — Other Ambulatory Visit: Payer: Self-pay

## 2015-06-02 DIAGNOSIS — I7121 Aneurysm of the ascending aorta, without rupture: Secondary | ICD-10-CM

## 2015-06-02 DIAGNOSIS — I712 Thoracic aortic aneurysm, without rupture: Secondary | ICD-10-CM | POA: Diagnosis not present

## 2015-06-03 ENCOUNTER — Telehealth: Payer: Self-pay | Admitting: Family Medicine

## 2015-06-03 DIAGNOSIS — I712 Thoracic aortic aneurysm, without rupture: Secondary | ICD-10-CM

## 2015-06-03 DIAGNOSIS — I7121 Aneurysm of the ascending aorta, without rupture: Secondary | ICD-10-CM

## 2015-06-03 NOTE — Telephone Encounter (Signed)
See result note. Will refer to CT surgery.

## 2015-06-03 NOTE — Telephone Encounter (Signed)
Pt would like to know if report has come in and what it says. Please call back at [phone] Thank you

## 2015-06-08 ENCOUNTER — Encounter: Payer: Self-pay | Admitting: Surgery

## 2015-06-08 ENCOUNTER — Other Ambulatory Visit: Payer: Self-pay | Admitting: *Deleted

## 2015-06-08 ENCOUNTER — Institutional Professional Consult (permissible substitution) (INDEPENDENT_AMBULATORY_CARE_PROVIDER_SITE_OTHER): Payer: BLUE CROSS/BLUE SHIELD | Admitting: Surgery

## 2015-06-08 VITALS — BP 138/97 | HR 90 | Resp 20 | Ht 70.0 in | Wt 218.0 lb

## 2015-06-08 DIAGNOSIS — I712 Thoracic aortic aneurysm, without rupture, unspecified: Secondary | ICD-10-CM

## 2015-06-08 DIAGNOSIS — I7121 Aneurysm of the ascending aorta, without rupture: Secondary | ICD-10-CM

## 2015-06-09 ENCOUNTER — Ambulatory Visit
Admission: RE | Admit: 2015-06-09 | Discharge: 2015-06-09 | Disposition: A | Discharge status: Home / Self Care | Payer: BLUE CROSS/BLUE SHIELD | Source: Ambulatory Visit | Attending: Surgery | Admitting: Surgery

## 2015-06-09 DIAGNOSIS — I712 Thoracic aortic aneurysm, without rupture, unspecified: Secondary | ICD-10-CM

## 2015-06-12 ENCOUNTER — Encounter: Payer: Self-pay | Admitting: Surgery

## 2015-06-12 NOTE — Progress Notes (Signed)
Cardiothoracic Surgery Consultation  PCP is [name], MD Referring Provider is [name], MD  Chief Complaint  Patient presents with  . Thoracic Aortic Aneurysm    Surgical eval, ECHO 06/02/2015    HPI:  The patient ois a 64 year old gentleman with a history of hypertension and renal cell carcinoma s/p right nephrectomy who is followed at [facility] by Dr. Celso Sickle. He was seen there for his routine follow up in October and a CXR reportedly showed a dilated ascending aorta. He underwent an echo on 06/02/2015 in [place] which showed a dilated ascending aorta at 52 mm diameter with the distal ascending aorta being 40 mm and the proximal arch 38 mm. The aortic root was 46 mm. The images of the aortic valve suggested a bicuspid valve with mildly thickened leaflets and mild AI. The LVEF was normal. He denies chest pain, shortness of breath, and dizziness.   Past Medical History  Diagnosis Date  . Sinus headache     recurrent  . HTN (hypertension)   . Renal cell carcinoma ([place]) 2013    clear cell s/p R nephrectomy  . S/p nephrectomy 2013    [facility]  . CAD (coronary artery disease) 2013    per CT scan  - brings CD of CT, no report - will need [facility] records of CT scan report  . Diverticulosis     by CT scan  . CKD (chronic kidney disease) stage 3, GFR 30-59 ml/min 2015    iatrogenic (decreased nephron mass) established with Dr. Alfonse Alpers [facility])    Past Surgical History  Procedure Laterality Date  . Nasal sinus surgery  1978    sinus surgery done at [facility]  . Rectal fistula repair  1980s  . Nephrectomy  12/2011    [facility], R for RCC  . Colonoscopy  2011    per pt normal    Family History  Problem Relation Age of Onset  . Cancer Paternal Grandmother     throat  . Heart disease Mother   . Heart disease Father   . Coronary artery disease Maternal Uncle     MI  . Cancer Paternal Aunt     breast   . Hypertension Mother   . Diabetes Father   . Stroke Neg Hx   . Cancer Paternal  Aunt     brain cancer  . Colon polyps Father   . Colon polyps Brother   No history of aortic valve disease or aneurysm disease.  Social History Social History  Substance Use Topics  . Smoking status: Never Smoker   . Smokeless tobacco: Never Used  . Alcohol Use: Yes     Comment: Socially    Current Outpatient Prescriptions  Medication Sig Dispense Refill  . aspirin 81 MG tablet Take 81 mg by mouth daily.    . fluticasone (FLONASE) 50 MCG/ACT nasal spray INHALE 2 SPRAYS INTO EACH NOSTRIL EVERY DAY. 16 g 11  . Multiple Vitamins-Minerals (MULTIVITAMIN ADULT PO) Take 1 tablet by mouth every other day.    . Omega-3 Fatty Acids (FISH OIL) 1000 MG CAPS Take 1 capsule by mouth daily.    . sildenafil (VIAGRA) 100 MG tablet Take 0.5-1 tablets (50-100 mg total) by mouth as needed. 10 tablet 3  . telmisartan (MICARDIS) 40 MG tablet TAKE 1 TABLET BY MOUTH TWICE DAILY 180 tablet 3   No current facility-administered medications for this visit.    No Known Allergies  Review of Systems  Constitutional: Negative.  HENT: Negative.   Eyes: Negative.   Respiratory: Negative for shortness of breath.   Cardiovascular: Negative for chest pain, palpitations and leg swelling.  Gastrointestinal:       Frequent heartburn  Endocrine: Negative.   Genitourinary:       S/p right nephrectomy for cancer  Musculoskeletal: Negative.   Skin: Negative.   Allergic/Immunologic: Negative.   Neurological: Negative.   Hematological: Negative.   Psychiatric/Behavioral: Negative.     BP 138/97 mmHg  Pulse 90  Resp 20  Ht 5\' 10"  (1.778 m)  Wt 218 lb (98.884 kg)  BMI 31.28 kg/m2  SpO2 98% Physical Exam  Constitutional: He is oriented to person, place, and time. He appears well-developed and well-nourished.  HENT:  Mouth/Throat: Oropharynx is clear and moist.  Eyes: EOM are normal. Pupils are equal, round, and reactive to light.  Neck: Normal range of motion. Neck supple. No JVD present. No thyromegaly  present.  Cardiovascular: Normal rate, regular rhythm and intact distal pulses.   Murmur heard. 1/6 systolic murmur along RUSB  Pulmonary/Chest: Effort normal and breath sounds normal. No respiratory distress.  Abdominal: Soft. Bowel sounds are normal. He exhibits no distension and no mass. There is no tenderness.  Musculoskeletal: Normal range of motion. He exhibits no edema.  Lymphadenopathy:    He has no cervical adenopathy.  Neurological: He is alert and oriented to person, place, and time. He has normal strength. No cranial nerve deficit or sensory deficit.  Skin: Skin is warm and dry.  Psychiatric: He has a normal mood and affect.     Diagnostic Tests:     *[place]            [place], [place] [id number]              [phone]  ------------------------------------------------------------------- Transthoracic Echocardiography  Patient:  Aaron Cole, Aaron Cole MR #:    [id number] Study Date: 06/02/2015 Gender:   M Age:    12 Height:   177.8 cm Weight:   97.1 kg BSA:    2.22 m^2 Pt. Status: Room:  ATTENDING  Default, Provider [phone] Avel Peace [id number] [place], [place] PERFORMING  [place], [place] SONOGRAPHER Pilar Jarvis, RVT, RDCS, RDMS  cc:  ------------------------------------------------------------------- LV EF: 60% -  65%  ------------------------------------------------------------------- History:  PMH: Ascending aortic aneurysm suspected from CXR images. Murmur. Coronary artery disease. Risk factors: Hypertension. Dyslipidemia.  ------------------------------------------------------------------- Study Conclusions  - Left ventricle: The cavity size was normal. Systolic function was normal. The estimated ejection fraction was in the range of 60% to 65%. Wall motion was normal; there were no  regional wall motion abnormalities. Doppler parameters are consistent with abnormal left ventricular relaxation (grade 1 diastolic dysfunction). - Aortic valve: Images suggestive of bicuspid aortic valve. Leaflets are mildly thickened. There was mild regurgitation. - Aorta: Severely dilated ascending aorta, estimated at diameter: 52 mm (S). Distal ascending aorta estimated at 4 cm, proximal arch 3.8 cm. - Aortic root: The aortic root was moderately dilated at 4.6 cm. - Mitral valve: There was mild regurgitation.  Impressions:  - Consdier cardiology and cardiothoracic srgery referrral for evaluation and management of ascending aorta aneurysm.  Transthoracic echocardiography. M-mode, complete 2D, spectral Doppler, and color Doppler. Birthdate: Patient birthdate: 1950-09-29. Age: Patient is 64 yr old. Sex: Gender: male. BMI: 30.7 kg/m^2. Blood pressure:   126/102 Patient status: Outpatient. Study date: Study date: 06/02/2015. Study time:  02:42 PM.  -------------------------------------------------------------------  ------------------------------------------------------------------- Left ventricle: The cavity size was normal. Systolic function was normal. The estimated ejection fraction was in the range of 60% to 65%. Wall motion was normal; there were no regional wall motion abnormalities. Doppler parameters are consistent with abnormal left ventricular relaxation (grade 1 diastolic dysfunction).  ------------------------------------------------------------------- Aortic valve: Images suggestive of bicuspid aortic valve. Leaflets are mildly thickened. Mobility was not restricted. Doppler: Transvalvular velocity was within the normal range. There was no stenosis. There was mild regurgitation.  ------------------------------------------------------------------- Aorta: Aortic root: The aortic root was moderately dilated at  4.6 cm.  ------------------------------------------------------------------- Mitral valve:  Structurally normal valve.  Mobility was not restricted. Doppler: Transvalvular velocity was within the normal range. There was no evidence for stenosis. There was mild regurgitation.  ------------------------------------------------------------------- Left atrium: The atrium was normal in size.  ------------------------------------------------------------------- Right ventricle: The cavity size was normal. Wall thickness was normal. Systolic function was normal.  ------------------------------------------------------------------- Pulmonic valve:  Structurally normal valve.  Cusp separation was normal. Doppler: Transvalvular velocity was within the normal range. There was no evidence for stenosis. There was trivial regurgitation.  ------------------------------------------------------------------- Tricuspid valve:  Structurally normal valve.  Doppler: Transvalvular velocity was within the normal range. There was mild regurgitation.  ------------------------------------------------------------------- Pulmonary artery:  The main pulmonary artery was normal-sized. Systolic pressure was within the normal range.  ------------------------------------------------------------------- Right atrium: The atrium was normal in size.  ------------------------------------------------------------------- Pericardium: There was no pericardial effusion.  ------------------------------------------------------------------- Systemic veins: Inferior vena cava: The vessel was normal in size.  ------------------------------------------------------------------- Measurements  Left ventricle               Value    Reference LV ID, ED, PLAX chordal           43.6 mm   43 - 52 LV ID, ES, PLAX chordal           29.7 mm   23 - 38 LV fx shortening,  PLAX chordal       32  %   >=29 LV PW thickness, ED             11  mm   --------- IVS/LV PW ratio, ED             1.29     <=1.3 Stroke volume, 2D              67  ml   --------- Stroke volume/bsa, 2D            30  ml/m^2 --------- LV e&', lateral               7.8  cm/s  --------- LV E/e&', lateral              5.82     --------- LV e&', medial                5.85 cm/s  --------- LV E/e&', medial               7.76     --------- LV e&', average               6.83 cm/s  --------- LV E/e&', average              6.65     ---------  Ventricular septum             Value    Reference IVS thickness, ED  14.2 mm   ---------  LVOT                    Value    Reference LVOT ID, S                 24  mm   --------- LVOT area                  4.52 cm^2  --------- LVOT ID                   24  mm   --------- LVOT peak velocity, S            84.2 cm/s  --------- LVOT mean velocity, S            53.7 cm/s  --------- LVOT VTI, S                 14.9 cm   --------- LVOT peak gradient, S            3   mm Hg --------- Stroke volume (SV), LVOT DP         67.4 ml   --------- Stroke index (SV/bsa), LVOT DP       30.4 ml/m^2 ---------  Aorta                    Value    Reference Aortic root ID, ED             46  mm   --------- Ascending aorta ID, A-P, S         52  mm   ---------  Left atrium                 Value    Reference LA ID, A-P, ES               35  mm   --------- LA ID/bsa, A-P                1.58 cm/m^2 <=2.2 LA volume, ES, 1-p A4C           39  ml   --------- LA volume/bsa, ES, 1-p A4C         17.6 ml/m^2 ---------  Mitral valve                Value    Reference Mitral E-wave peak velocity         45.4 cm/s  --------- Mitral A-wave peak velocity         71.1 cm/s  --------- Mitral deceleration time      (H)   239  ms   150 - 230 Mitral E/A ratio, peak           0.6     ---------  Tricuspid valve               Value    Reference Tricuspid maximal inflow velocity,     240  cm/s  --------- PISA  Legend: (L) and (H) mark values outside specified reference range.  ------------------------------------------------------------------- Prepared and Electronically Authenticated by  Esmond Plants, MD, [facility] 2016-12-01T19:51:31   Impression:  I have personally reviewed his echocardiogram and reviewed the [facility] records through Care Everywhere. He has a 5.2 cm ascending aortic aneurysm with a suspected bicuspid aortic valve by echocardiogram. There is no prior history of this. I think a CT of the chest is indicated to assess the  entire thoracic aorta. This can be done without contrast since he only has one kidney.   Plan:  I will see him back after the CT scan to review the results with him and decide on a course of action.   [name], MD [facility] [phone]

## 2015-06-14 ENCOUNTER — Encounter: Payer: Self-pay | Admitting: Surgery

## 2015-06-14 ENCOUNTER — Ambulatory Visit (INDEPENDENT_AMBULATORY_CARE_PROVIDER_SITE_OTHER): Payer: BLUE CROSS/BLUE SHIELD | Admitting: Surgery

## 2015-06-14 VITALS — BP 148/98 | HR 92 | Resp 20 | Ht 70.0 in | Wt 218.0 lb

## 2015-06-14 DIAGNOSIS — I712 Thoracic aortic aneurysm, without rupture: Secondary | ICD-10-CM

## 2015-06-14 DIAGNOSIS — R911 Solitary pulmonary nodule: Secondary | ICD-10-CM | POA: Diagnosis not present

## 2015-06-14 DIAGNOSIS — I7121 Aneurysm of the ascending aorta, without rupture: Secondary | ICD-10-CM

## 2015-06-14 NOTE — Progress Notes (Signed)
HPI:  The patient returns today to review the results of his recent CT scan of the chest. This shows a fusiform aortic root and ascending aortic aneurysm with a maximum diameter of 5.2 cm at the level of the right PA. The aortic root is 4.6 cm and the proximal arch at the level of the innominate artery is 3.8 cm. The is extensive calcification of the mitral annulus extending over to the aortic valve. His echo showed a bicuspid aortic valve with thickening and some calcification of the leaflets with restricted motion of one of the leaflets.   Current Outpatient Prescriptions  Medication Sig Dispense Refill  . aspirin 81 MG tablet Take 81 mg by mouth daily.    . fluticasone (FLONASE) 50 MCG/ACT nasal spray INHALE 2 SPRAYS INTO EACH NOSTRIL EVERY DAY. 16 g 11  . Multiple Vitamins-Minerals (MULTIVITAMIN ADULT PO) Take 1 tablet by mouth every other day.    . Omega-3 Fatty Acids (FISH OIL) 1000 MG CAPS Take 1 capsule by mouth daily.    . sildenafil (VIAGRA) 100 MG tablet Take 0.5-1 tablets (50-100 mg total) by mouth as needed. 10 tablet 3  . telmisartan (MICARDIS) 40 MG tablet TAKE 1 TABLET BY MOUTH TWICE DAILY 180 tablet 3   No current facility-administered medications for this visit.     Physical Exam: BP 148/98 mmHg  Pulse 92  Resp 20  Ht 5\' 10"  (1.778 m)  Wt 218 lb (98.884 kg)  BMI 31.28 kg/m2  SpO2 98% He looks well 1/6 systolic murmur along the RSB. Lungs are clear  Diagnostic Tests:   CLINICAL DATA: Known thoracic aortic aneurysm, patient is asymptomatic, history of lung carcinoma, headache, prior nephrectomy  EXAM: CT CHEST WITHOUT CONTRAST  TECHNIQUE: Multidetector CT imaging of the chest was performed following the standard protocol without IV contrast.  COMPARISON: None.  FINDINGS: On soft tissue window images, the thyroid gland is unremarkable. On this unenhanced study, only small mediastinal lymph nodes are present. No mediastinal or hilar  adenopathy is evident. The mid ascending thoracic aorta measures 5.2 cm in transverse diameter. Recommend semi-annual imaging followup by CTA or MRA and referral to cardiothoracic surgery if not already obtained. This recommendation follows 2010 ACCF/AHA/AATS/ACR/ASA/SCA/SCAI/SIR/STS/SVM Guidelines for the Diagnosis and Management of Patients With Thoracic Aortic Disease. Circulation. 2010; 121: e266-e36. Direct comparison with any prior outside CT of the to may be helpful to assess change. There is cardiomegaly present and there is considerable calcification of the mitral annulus. There does appear to be a small hiatal hernia present. Incidental imaging of the upper abdomen shows no significant abnormality. A right nephrectomy has been performed.  On lung window images, mild biapical parenchymal scarring is noted. On image 11 there is a 7 mm noncalcified nodular opacity near the left lung apex. A 6 mm noncalcified subpleural nodule is present in the left upper lobe near the apex on image 9. A 4 mm noncalcified nodular opacity is noted pleural-based in the anterior lateral left upper lobe on image 17. A 3 mm noncalcified nodule is present in the posterior left upper lobe on image 18. No additional pulmonary nodule is seen. These small noncalcified nodules most likely are postinflammatory or post infectious in etiology. However, followup CT of the chest is recommended in 4-6 months to assess stability. No parenchymal infiltrate is noted. There is no evidence of pleural effusion. The thoracic vertebrae are in normal alignment with mild degenerative spurring in the lower thoracic spine.  IMPRESSION: 1. Several  noncalcified lung nodules are noted as described above. Recommend followup CT of the chest in 4-6 months to assess stability. 2. The mid thoracic ascending thoracic aorta measures 5.2 cm in diameter. Recommend semi-annual imaging followup by CTA or MRA and referral to  cardiothoracic surgery if not already obtained. This recommendation follows 2010 ACCF/AHA/AATS/ACR/ASA/SCA/SCAI/SIR/STS/SVM Guidelines for the Diagnosis and Management of Patients With Thoracic Aortic Disease. Circulation. 2010; 121: [phone]   Electronically Signed  By: Ivar Drape M.D.  On: 06/09/2015 15:48      Impression:  I have personally reviewed his echo again and recent CT scan of the chest. He has a bicuspid aortic valve that is moderately calcified and has restricted leaflet motion. I don't see any measurements of the aortic valve pressure gradient on the echo and nothing about it in the report. There is mild AI. He has a fusiform ascending aortic aneurysm with a maximum diameter of 5.2 cm. The current guidelines recommend replacement of the ascending aortic aneurysm at 5.0 cm when there is a bicuspid aortic valve due to the nine times higher risk of aortic dissection compared to patients with a tri-leaflet aortic valve. Forty percent of these aortic dissections occur at diameters less than 5 cm. I have recommended replacement of his aortic valve and ascending aorta. I don't think he is a candidate for a valve-sparing procedure due to the thickening, restricted mobility and calcification of the aortic valve. I discussed the options of mechanical and tissue valves with him and the need for lifelong coumadin with a mechanical valve. He is certain that he does not want to be on coumadin and wants a tissue valve. He will require a cardiac cath preop and only has one kidney so I would like to have the cath done a week before the surgery to minimize the risk of renal insult. His creat is normal and GFR is 67. He would like to have surgery as soon as possible since he owns and runs a farming and [employer] and his busy season starts in the middle of January. He understands that he will not be able to lift anything heavier than 10 lbs for 3 months but he says that he just wants to be  present in the office to manage the operation. I will have him see cardiology and try to get the cath done as quickly as possible but he understands that the holiday season is starting soon.   Plan:  He will see cardiology for catheterization and then will have a Bentall procedure with a tissue valved graft a week later. I will be available to do this anytime in the next few weeks.   [name], MD [facility] [phone]

## 2015-06-15 ENCOUNTER — Other Ambulatory Visit: Payer: Self-pay | Admitting: *Deleted

## 2015-06-15 DIAGNOSIS — I77810 Thoracic aortic ectasia: Secondary | ICD-10-CM

## 2015-06-18 ENCOUNTER — Other Ambulatory Visit: Payer: Self-pay | Admitting: Family Medicine

## 2015-06-20 NOTE — Progress Notes (Signed)
Cardiology Office Note   Date:  06/21/2015   ID:  Aaron Cole, DOB 19-Aug-1950, MRN [id number]  PCP:  [name], MD  Cardiologist:  Sinclair Grooms, MD   Chief Complaint  Patient presents with  . Thoracic Aortic Aneurysm    ascending      History of Present Illness: Aaron Cole is a 64 y.o. male who presents for  Pre-cath evaluation. He has been recently diagnosed with an ascending thoracic aortic aneurysm picked up on chest x-ray and confirmed by both echocardiography and CT angiogram. He is asymptomatic. The chest x-ray was done because of a history of renal cell carcinoma.  He has no history of cardiac disease. 40 years ago he was told of a heart murmur. He denies exertional intolerance including dyspnea, chest pain, palpitations, and syncope. He has not noticed lower extremity swelling and claudication. CT demonstrated a maximal ascending aortic diameter 5.2 centimeters. Coincidental, calcified pulmonary nodules were also noted  Echocardiography demonstrates possible bicuspid aortic valve with mild aortic regurgitation. His mother had vascular disease but no report of aortic disease.    Past Medical History  Diagnosis Date  . Sinus headache     recurrent  . HTN (hypertension)   . Renal cell carcinoma ([place]) 2013    clear cell s/p R nephrectomy  . S/p nephrectomy 2013    [facility]  . CAD (coronary artery disease) 2013    per CT scan  - brings CD of CT, no report - will need [facility] records of CT scan report  . Diverticulosis     by CT scan  . CKD (chronic kidney disease) stage 3, GFR 30-59 ml/min 2015    iatrogenic (decreased nephron mass) established with Dr. Alfonse Alpers [facility])    Past Surgical History  Procedure Laterality Date  . Nasal sinus surgery  1978    sinus surgery done at [facility]  . Rectal fistula repair  1980s  . Nephrectomy  12/2011    [facility], R for RCC  . Colonoscopy  2011    per pt normal     Current Outpatient Prescriptions  Medication Sig Dispense  Refill  . fluticasone (FLONASE) 50 MCG/ACT nasal spray INHALE 2 SPRAYS INTO EACH NOSTRIL EVERY DAY. 16 g 11  . Multiple Vitamins-Minerals (MULTIVITAMIN ADULT PO) Take 1 tablet by mouth every other day.    . sildenafil (VIAGRA) 100 MG tablet Take 0.5-1 tablets (50-100 mg total) by mouth as needed. 10 tablet 3  . telmisartan (MICARDIS) 40 MG tablet TAKE 1 TABLET BY MOUTH TWICE A DAY 180 tablet 3   No current facility-administered medications for this visit.    Allergies:   Review of patient's allergies indicates no known allergies.    Social History:  The patient  reports that he has never smoked. He has never used smokeless tobacco. He reports that he drinks alcohol. He reports that he does not use illicit drugs.   Family History:  The patient's family history includes Cancer in his paternal aunt, paternal aunt, and paternal grandmother; Colon polyps in his brother and father; Coronary artery disease in his maternal uncle; Diabetes in his father; Heart disease in his father and mother; Hypertension in his mother. There is no history of Stroke.    ROS:  Please see the history of present illness.   Otherwise, review of systems are positive for  He is a former and has no limitations. Presentation for renal cell carcinoma was hematuria..   All other systems  are reviewed and negative.    PHYSICAL EXAM: VS:  BP 152/98 mmHg  Pulse 64  Ht 6' (1.829 m)  Wt 221 lb 6.4 oz (100.426 kg)  BMI 30.02 kg/m2 , BMI Body mass index is 30.02 kg/(m^2). GEN: Well nourished, well developed, in no acute distress HEENT: normal Neck: no JVD, carotid bruits, or masses Cardiac: RRR.  There  is no murmur, rub, or gallop. There is  no edema. Respiratory:  clear to auscultation bilaterally, normal work of breathing. GI: soft, nontender, nondistended, + BS MS: no deformity or atrophy Skin: warm and dry, no rash Neuro:  Strength and sensation are intact Psych: euthymic mood, full affect   EKG:  EKG is ordered  today. The ekg reveals  Normal sinus rhythm, prominent voltage, left axis deviation.   Recent Labs: 12/15/2014: BUN 24*; Creatinine, Ser 1.17; Hemoglobin 14.6; Platelets 195.0; Potassium 4.3; Sodium 138    Lipid Panel    Component Value Date/Time   CHOL 185 12/15/2014 0745   TRIG 157.0* 12/15/2014 0745   TRIG 131 06/21/2011   HDL 33.30* 12/15/2014 0745   CHOLHDL 6 12/15/2014 0745   VLDL 31.4 12/15/2014 0745   LDLCALC 120* 12/15/2014 0745   LDLDIRECT 118 06/21/2011      Wt Readings from Last 3 Encounters:  06/21/15 221 lb 6.4 oz (100.426 kg)  06/14/15 218 lb (98.884 kg)  06/08/15 218 lb (98.884 kg)      Other studies Reviewed: Additional studies/ records that were reviewed today include:  Blood work is pending. The findings include  Echo and CT images were reviewed.    ASSESSMENT AND PLAN:  1. Aneurysm, ascending aorta (HCC)  Asymptomatic ascending aortic aneurysm likely related to a bicuspid aortic valve. The patient is seen Dr. Cyndia Bent and plans have been made to perform a Bentall procedure  2. CKD (chronic kidney disease) stage 3, GFR 30-59 ml/min  related to right nephrectomy for renal cell carcinoma  3. Essential hypertension  systolic pressures mildly elevated today.  4. Renal cell carcinoma, right ([place])  status post right kidney resection  5. HLD (hyperlipidemia)  not evaluated    Current medicines are reviewed at length with the patient today.  The patient has the following concerns regarding medicines:  ACE inhibitor therapy an our diuretic therapy will be held..  The following changes/actions have been instituted:     left and right heart catheterization with coronary angiography was recommended. The procedure was explained to the patient in detail including the risk of stroke, death, myocardial infarction, kidney injury, bleeding, limb ischemia, contrast allergy, and infection.  Questions were appropriately answered and the patient has consented to  proceed with the procedure.    ARB therapy will be held   Aggressive hydration on the morning of the procedure  Labs/ tests ordered today include:   Orders Placed This Encounter  Procedures  . Basic metabolic panel  . CBC w/Diff  . INR/PT  . EKG 12-Lead     Disposition:   FU with HS in prn    Signed, Sinclair Grooms, MD  06/21/2015 12:52 PM    [address], [place], [place]  [address] Phone: [phone]; Fax: [phone]

## 2015-06-21 ENCOUNTER — Telehealth: Payer: Self-pay

## 2015-06-21 ENCOUNTER — Ambulatory Visit (INDEPENDENT_AMBULATORY_CARE_PROVIDER_SITE_OTHER): Payer: BLUE CROSS/BLUE SHIELD | Admitting: Interventional Cardiology

## 2015-06-21 ENCOUNTER — Encounter: Payer: Self-pay | Admitting: Interventional Cardiology

## 2015-06-21 VITALS — BP 152/98 | HR 64 | Ht 72.0 in | Wt 221.4 lb

## 2015-06-21 DIAGNOSIS — N183 Chronic kidney disease, stage 3 unspecified: Secondary | ICD-10-CM

## 2015-06-21 DIAGNOSIS — I712 Thoracic aortic aneurysm, without rupture: Secondary | ICD-10-CM

## 2015-06-21 DIAGNOSIS — I10 Essential (primary) hypertension: Secondary | ICD-10-CM | POA: Diagnosis not present

## 2015-06-21 DIAGNOSIS — I7121 Aneurysm of the ascending aorta, without rupture: Secondary | ICD-10-CM

## 2015-06-21 DIAGNOSIS — Z01812 Encounter for preprocedural laboratory examination: Secondary | ICD-10-CM

## 2015-06-21 DIAGNOSIS — C641 Malignant neoplasm of right kidney, except renal pelvis: Secondary | ICD-10-CM | POA: Diagnosis not present

## 2015-06-21 DIAGNOSIS — E785 Hyperlipidemia, unspecified: Secondary | ICD-10-CM

## 2015-06-21 LAB — BASIC METABOLIC PANEL
BUN: 20 mg/dL (ref 7–25)
CALCIUM: 9.3 mg/dL (ref 8.6–10.3)
CHLORIDE: 104 mmol/L (ref 98–110)
CO2: 21 mmol/L (ref 20–31)
CREATININE: 1.18 mg/dL (ref 0.70–1.25)
GLUCOSE: 95 mg/dL (ref 65–99)
Potassium: 4.3 mmol/L (ref 3.5–5.3)
SODIUM: 137 mmol/L (ref 135–146)

## 2015-06-21 LAB — CBC WITH DIFFERENTIAL/PLATELET
BASOS PCT: 0 % (ref 0–1)
Basophils Absolute: 0 10*3/uL (ref 0.0–0.1)
Eosinophils Absolute: 0.1 10*3/uL (ref 0.0–0.7)
Eosinophils Relative: 1 % (ref 0–5)
HCT: 41.6 % (ref 39.0–52.0)
HEMOGLOBIN: 14 g/dL (ref 13.0–17.0)
LYMPHS ABS: 3.1 10*3/uL (ref 0.7–4.0)
Lymphocytes Relative: 43 % (ref 12–46)
MCH: 28.9 pg (ref 26.0–34.0)
MCHC: 33.7 g/dL (ref 30.0–36.0)
MCV: 85.8 fL (ref 78.0–100.0)
MONO ABS: 0.7 10*3/uL (ref 0.1–1.0)
MPV: 11.1 fL (ref 8.6–12.4)
Monocytes Relative: 10 % (ref 3–12)
NEUTROS ABS: 3.3 10*3/uL (ref 1.7–7.7)
Neutrophils Relative %: 46 % (ref 43–77)
Platelets: 200 10*3/uL (ref 150–400)
RBC: 4.85 MIL/uL (ref 4.22–5.81)
RDW: 14.5 % (ref 11.5–15.5)
WBC: 7.2 10*3/uL (ref 4.0–10.5)

## 2015-06-21 NOTE — Telephone Encounter (Signed)
Pt aware per Dr.Smith. Hold Micardis on Wed 12/21 and the morning of his procedure on 12/22. Pt verbalized understanding.

## 2015-06-21 NOTE — Patient Instructions (Signed)
Medication Instructions:  Your physician recommends that you continue on your current medications as directed. Please refer to the Current Medication list given to you today.  Labwork: Bmet, Cbc, Pt/Inr  Testing/Procedures: Your physician has requested that you have a cardiac catheterization. Cardiac catheterization is used to diagnose and/or treat various heart conditions. Doctors may recommend this procedure for a number of different reasons. The most common reason is to evaluate chest pain. Chest pain can be a symptom of coronary artery disease (CAD), and cardiac catheterization can show whether plaque is narrowing or blocking your heart's arteries. This procedure is also used to evaluate the valves, as well as measure the blood flow and oxygen levels in different parts of your heart. For further information please visit [email or web address]. Please follow instruction sheet, as given.   Follow-Up: Your physician recommends that you schedule a follow-up appointment as needed   Any Other Special Instructions Will Be Listed Below (If Applicable).     If you need a refill on your cardiac medications before your next appointment, please call your pharmacy.

## 2015-06-22 LAB — PROTIME-INR
INR: 1.04 (ref <1.50)
PROTHROMBIN TIME: 13.7 s (ref 11.6–15.2)

## 2015-06-23 ENCOUNTER — Ambulatory Visit (HOSPITAL_COMMUNITY)
Admission: RE | Admit: 2015-06-23 | Discharge: 2015-06-23 | Disposition: A | Discharge status: Home / Self Care | Payer: BLUE CROSS/BLUE SHIELD | Source: Ambulatory Visit | Attending: Interventional Cardiology | Admitting: Interventional Cardiology

## 2015-06-23 ENCOUNTER — Encounter (HOSPITAL_COMMUNITY)
Admission: RE | Disposition: A | Discharge status: Home / Self Care | Payer: BLUE CROSS/BLUE SHIELD | Source: Ambulatory Visit | Attending: Interventional Cardiology

## 2015-06-23 DIAGNOSIS — N183 Chronic kidney disease, stage 3 unspecified: Secondary | ICD-10-CM | POA: Diagnosis present

## 2015-06-23 DIAGNOSIS — Q231 Congenital insufficiency of aortic valve: Secondary | ICD-10-CM | POA: Diagnosis present

## 2015-06-23 DIAGNOSIS — E785 Hyperlipidemia, unspecified: Secondary | ICD-10-CM | POA: Insufficient documentation

## 2015-06-23 DIAGNOSIS — I712 Thoracic aortic aneurysm, without rupture: Secondary | ICD-10-CM | POA: Insufficient documentation

## 2015-06-23 DIAGNOSIS — Z85528 Personal history of other malignant neoplasm of kidney: Secondary | ICD-10-CM | POA: Diagnosis not present

## 2015-06-23 DIAGNOSIS — Z8249 Family history of ischemic heart disease and other diseases of the circulatory system: Secondary | ICD-10-CM | POA: Diagnosis not present

## 2015-06-23 DIAGNOSIS — Z905 Acquired absence of kidney: Secondary | ICD-10-CM | POA: Insufficient documentation

## 2015-06-23 DIAGNOSIS — I7121 Aneurysm of the ascending aorta, without rupture: Secondary | ICD-10-CM | POA: Diagnosis present

## 2015-06-23 DIAGNOSIS — I10 Essential (primary) hypertension: Secondary | ICD-10-CM | POA: Diagnosis present

## 2015-06-23 DIAGNOSIS — I718 Aortic aneurysm of unspecified site, ruptured: Secondary | ICD-10-CM | POA: Insufficient documentation

## 2015-06-23 DIAGNOSIS — Z7951 Long term (current) use of inhaled steroids: Secondary | ICD-10-CM | POA: Diagnosis not present

## 2015-06-23 DIAGNOSIS — I129 Hypertensive chronic kidney disease with stage 1 through stage 4 chronic kidney disease, or unspecified chronic kidney disease: Secondary | ICD-10-CM | POA: Diagnosis not present

## 2015-06-23 LAB — POCT I-STAT 3, VENOUS BLOOD GAS (G3P V)
ACID-BASE DEFICIT: 3 mmol/L — AB (ref 0.0–2.0)
BICARBONATE: 21.1 meq/L (ref 20.0–24.0)
O2 Saturation: 75 %
PH VEN: 7.425 — AB (ref 7.250–7.300)
PO2 VEN: 38 mmHg (ref 30.0–45.0)
TCO2: 22 mmol/L (ref 0–100)
pCO2, Ven: 32.1 mmHg — ABNORMAL LOW (ref 45.0–50.0)

## 2015-06-23 LAB — POCT I-STAT 3, ART BLOOD GAS (G3+)
Bicarbonate: 23.8 mEq/L (ref 20.0–24.0)
O2 SAT: 99 %
PCO2 ART: 36.4 mmHg (ref 35.0–45.0)
PH ART: 7.423 (ref 7.350–7.450)
PO2 ART: 131 mmHg — AB (ref 80.0–100.0)
TCO2: 25 mmol/L (ref 0–100)

## 2015-06-23 SURGERY — RIGHT HEART CATH AND CORONARY ANGIOGRAPHY

## 2015-06-23 MED ORDER — IOHEXOL 350 MG/ML SOLN
INTRAVENOUS | Status: DC | PRN
  Administered 2015-06-23: 50 mL via INTRAVENOUS

## 2015-06-23 MED ORDER — LABETALOL HCL 5 MG/ML IV SOLN
INTRAVENOUS | Status: AC
  Filled 2015-06-23: qty 4

## 2015-06-23 MED ORDER — MIDAZOLAM HCL 2 MG/2ML IJ SOLN
INTRAMUSCULAR | Status: DC | PRN
  Administered 2015-06-23 (×3): 1 mg via INTRAVENOUS

## 2015-06-23 MED ORDER — SODIUM CHLORIDE 0.9 % IV SOLN: 250.0000 mL | INTRAVENOUS | Status: DC | PRN

## 2015-06-23 MED ORDER — FENTANYL CITRATE (PF) 100 MCG/2ML IJ SOLN
INTRAMUSCULAR | Status: DC | PRN
  Administered 2015-06-23 (×3): 50 ug via INTRAVENOUS

## 2015-06-23 MED ORDER — OXYCODONE-ACETAMINOPHEN 5-325 MG PO TABS: 1.0000 | ORAL_TABLET | ORAL | Status: DC | PRN

## 2015-06-23 MED ORDER — SODIUM CHLORIDE 0.9 % IJ SOLN: 3.0000 mL | INTRAMUSCULAR | Status: DC | PRN

## 2015-06-23 MED ORDER — SODIUM CHLORIDE 0.9 % IJ SOLN: 3.0000 mL | Freq: Two times a day (BID) | INTRAMUSCULAR | Status: DC

## 2015-06-23 MED ORDER — SODIUM CHLORIDE 0.9 % WEIGHT BASED INFUSION: 1.0000 mL/kg/h | INTRAVENOUS | Status: DC

## 2015-06-23 MED ORDER — FENTANYL CITRATE (PF) 100 MCG/2ML IJ SOLN
INTRAMUSCULAR | Status: AC
  Filled 2015-06-23: qty 2

## 2015-06-23 MED ORDER — HEPARIN (PORCINE) IN NACL 2-0.9 UNIT/ML-% IJ SOLN
INTRAMUSCULAR | Status: AC
  Filled 2015-06-23: qty 1000

## 2015-06-23 MED ORDER — MIDAZOLAM HCL 2 MG/2ML IJ SOLN
INTRAMUSCULAR | Status: AC
  Filled 2015-06-23: qty 2

## 2015-06-23 MED ORDER — HEPARIN SODIUM (PORCINE) 1000 UNIT/ML IJ SOLN
INTRAMUSCULAR | Status: AC
  Filled 2015-06-23: qty 1

## 2015-06-23 MED ORDER — ACETAMINOPHEN 325 MG PO TABS: 650.0000 mg | ORAL_TABLET | ORAL | Status: DC | PRN

## 2015-06-23 MED ORDER — ASPIRIN 81 MG PO CHEW
81.0000 mg | CHEWABLE_TABLET | ORAL | Status: AC
  Administered 2015-06-23: 81 mg via ORAL

## 2015-06-23 MED ORDER — HEPARIN (PORCINE) IN NACL 2-0.9 UNIT/ML-% IJ SOLN
INTRAMUSCULAR | Status: DC | PRN
  Administered 2015-06-23: 12:00:00

## 2015-06-23 MED ORDER — ONDANSETRON HCL 4 MG/2ML IJ SOLN
4.0000 mg | Freq: Four times a day (QID) | INTRAMUSCULAR | Status: DC | PRN
Start: 2015-06-23 — End: 2015-06-23

## 2015-06-23 MED ORDER — LIDOCAINE HCL (PF) 1 % IJ SOLN
INTRAMUSCULAR | Status: AC
  Filled 2015-06-23: qty 30

## 2015-06-23 MED ORDER — ASPIRIN 81 MG PO CHEW
CHEWABLE_TABLET | ORAL | Status: AC
  Administered 2015-06-23: 81 mg via ORAL
  Filled 2015-06-23: qty 1

## 2015-06-23 MED ORDER — HEPARIN SODIUM (PORCINE) 1000 UNIT/ML IJ SOLN
INTRAMUSCULAR | Status: DC | PRN
  Administered 2015-06-23: 5000 [IU] via INTRAVENOUS

## 2015-06-23 MED ORDER — LIDOCAINE HCL (PF) 1 % IJ SOLN
INTRAMUSCULAR | Status: DC | PRN
  Administered 2015-06-23 (×2): 2 mL via INTRADERMAL

## 2015-06-23 MED ORDER — VERAPAMIL HCL 2.5 MG/ML IV SOLN
INTRAVENOUS | Status: DC | PRN
  Administered 2015-06-23: 10 mL via INTRA_ARTERIAL

## 2015-06-23 MED ORDER — SODIUM CHLORIDE 0.9 % WEIGHT BASED INFUSION
3.0000 mL/kg/h | INTRAVENOUS | Status: AC
  Administered 2015-06-23: 3 mL/kg/h via INTRAVENOUS

## 2015-06-23 SURGICAL SUPPLY — 11 items
CATH BALLN WEDGE 5F 110CM (CATHETERS) ×1 IMPLANT
CATH INFINITI 5FR JL4 (CATHETERS) ×1 IMPLANT
CATH INFINITI JR4 5F (CATHETERS) ×2 IMPLANT
DEVICE RAD COMP TR BAND LRG (VASCULAR PRODUCTS) ×2 IMPLANT
GLIDESHEATH SLEND A-KIT 6F 22G (SHEATH) ×2 IMPLANT
KIT HEART LEFT (KITS) ×2 IMPLANT
PACK CARDIAC CATHETERIZATION (CUSTOM PROCEDURE TRAY) ×2 IMPLANT
SHEATH FAST CATH BRACH 5F 5CM (SHEATH) ×1 IMPLANT
TRANSDUCER W/STOPCOCK (MISCELLANEOUS) ×3 IMPLANT
TUBING CIL FLEX 10 FLL-RA (TUBING) ×2 IMPLANT
WIRE SAFE-T 1.5MM-J .035X260CM (WIRE) ×2 IMPLANT

## 2015-06-23 NOTE — H&P (View-Only) (Signed)
Cardiology Office Note   Date:  06/21/2015   ID:  Aaron Cole, DOB Jul 19, 1950, MRN [id number]  PCP:  [name], MD  Cardiologist:  Sinclair Grooms, MD   Chief Complaint  Patient presents with  . Thoracic Aortic Aneurysm    ascending      History of Present Illness: Aaron Cole is a 64 y.o. male who presents for  Pre-cath evaluation. He has been recently diagnosed with an ascending thoracic aortic aneurysm picked up on chest x-ray and confirmed by both echocardiography and CT angiogram. He is asymptomatic. The chest x-ray was done because of a history of renal cell carcinoma.  He has no history of cardiac disease. 40 years ago he was told of a heart murmur. He denies exertional intolerance including dyspnea, chest pain, palpitations, and syncope. He has not noticed lower extremity swelling and claudication. CT demonstrated a maximal ascending aortic diameter 5.2 centimeters. Coincidental, calcified pulmonary nodules were also noted  Echocardiography demonstrates possible bicuspid aortic valve with mild aortic regurgitation. His mother had vascular disease but no report of aortic disease.    Past Medical History  Diagnosis Date  . Sinus headache     recurrent  . HTN (hypertension)   . Renal cell carcinoma ([place]) 2013    clear cell s/p R nephrectomy  . S/p nephrectomy 2013    [facility]  . CAD (coronary artery disease) 2013    per CT scan  - brings CD of CT, no report - will need [facility] records of CT scan report  . Diverticulosis     by CT scan  . CKD (chronic kidney disease) stage 3, GFR 30-59 ml/min 2015    iatrogenic (decreased nephron mass) established with Dr. Alfonse Alpers [facility])    Past Surgical History  Procedure Laterality Date  . Nasal sinus surgery  1978    [facility]  . Rectal fistula repair  1980s  . Nephrectomy  12/2011    [facility], R for RCC  . Colonoscopy  2011    per pt normal     Current Outpatient Prescriptions  Medication Sig Dispense  Refill  . fluticasone (FLONASE) 50 MCG/ACT nasal spray INHALE 2 SPRAYS INTO EACH NOSTRIL EVERY DAY. 16 g 11  . Multiple Vitamins-Minerals (MULTIVITAMIN ADULT PO) Take 1 tablet by mouth every other day.    . sildenafil (VIAGRA) 100 MG tablet Take 0.5-1 tablets (50-100 mg total) by mouth as needed. 10 tablet 3  . telmisartan (MICARDIS) 40 MG tablet TAKE 1 TABLET BY MOUTH TWICE A DAY 180 tablet 3   No current facility-administered medications for this visit.    Allergies:   Review of patient's allergies indicates no known allergies.    Social History:  The patient  reports that he has never smoked. He has never used smokeless tobacco. He reports that he drinks alcohol. He reports that he does not use illicit drugs.   Family History:  The patient's family history includes Cancer in his paternal aunt, paternal aunt, and paternal grandmother; Colon polyps in his brother and father; Coronary artery disease in his maternal uncle; Diabetes in his father; Heart disease in his father and mother; Hypertension in his mother. There is no history of Stroke.    ROS:  Please see the history of present illness.   Otherwise, review of systems are positive for  He is a former and has no limitations. Presentation for renal cell carcinoma was hematuria..   All other systems  are reviewed and negative.    PHYSICAL EXAM: VS:  BP 152/98 mmHg  Pulse 64  Ht 6' (1.829 m)  Wt 221 lb 6.4 oz (100.426 kg)  BMI 30.02 kg/m2 , BMI Body mass index is 30.02 kg/(m^2). GEN: Well nourished, well developed, in no acute distress HEENT: normal Neck: no JVD, carotid bruits, or masses Cardiac: RRR.  There  is no murmur, rub, or gallop. There is  no edema. Respiratory:  clear to auscultation bilaterally, normal work of breathing. GI: soft, nontender, nondistended, + BS MS: no deformity or atrophy Skin: warm and dry, no rash Neuro:  Strength and sensation are intact Psych: euthymic mood, full affect   EKG:  EKG is ordered  today. The ekg reveals  Normal sinus rhythm, prominent voltage, left axis deviation.   Recent Labs: 12/15/2014: BUN 24*; Creatinine, Ser 1.17; Hemoglobin 14.6; Platelets 195.0; Potassium 4.3; Sodium 138    Lipid Panel    Component Value Date/Time   CHOL 185 12/15/2014 0745   TRIG 157.0* 12/15/2014 0745   TRIG 131 06/21/2011   HDL 33.30* 12/15/2014 0745   CHOLHDL 6 12/15/2014 0745   VLDL 31.4 12/15/2014 0745   LDLCALC 120* 12/15/2014 0745   LDLDIRECT 118 06/21/2011      Wt Readings from Last 3 Encounters:  06/21/15 221 lb 6.4 oz (100.426 kg)  06/14/15 218 lb (98.884 kg)  06/08/15 218 lb (98.884 kg)      Other studies Reviewed: Additional studies/ records that were reviewed today include:  Blood work is pending. The findings include  Echo and CT images were reviewed.    ASSESSMENT AND PLAN:  1. Aneurysm, ascending aorta (HCC)  Asymptomatic ascending aortic aneurysm likely related to a bicuspid aortic valve. The patient is seen Dr. Cyndia Bent and plans have been made to perform a Bentall procedure  2. CKD (chronic kidney disease) stage 3, GFR 30-59 ml/min  related to right nephrectomy for renal cell carcinoma  3. Essential hypertension  systolic pressures mildly elevated today.  4. Renal cell carcinoma, right ([place])  status post right kidney resection  5. HLD (hyperlipidemia)  not evaluated    Current medicines are reviewed at length with the patient today.  The patient has the following concerns regarding medicines:  ACE inhibitor therapy an our diuretic therapy will be held..  The following changes/actions have been instituted:     left and right heart catheterization with coronary angiography was recommended. The procedure was explained to the patient in detail including the risk of stroke, death, myocardial infarction, kidney injury, bleeding, limb ischemia, contrast allergy, and infection.  Questions were appropriately answered and the patient has consented to  proceed with the procedure.    ARB therapy will be held   Aggressive hydration on the morning of the procedure  Labs/ tests ordered today include:   Orders Placed This Encounter  Procedures  . Basic metabolic panel  . CBC w/Diff  . INR/PT  . EKG 12-Lead     Disposition:   FU with HS in prn    Signed, Sinclair Grooms, MD  06/21/2015 12:52 PM    [address], [place], [place]  [address] Phone: [phone]; Fax: [phone]

## 2015-06-23 NOTE — Interval H&P Note (Signed)
History and Physical Interval Note:  06/23/2015 11:20 AM  Aaron Cole  has presented today for surgery, with the diagnosis of presurgical clearance  The various methods of treatment have been discussed with the patient and family. After consideration of risks, benefits and other options for treatment, the patient has consented to  Procedure(s): Right/Left Heart Cath and Coronary Angiography (N/A) as a surgical intervention .  The patient's history has been reviewed, patient examined, no change in status, stable for surgery.  I have reviewed the patient's chart and labs.  Questions were answered to the patient's satisfaction.     Sinclair Grooms

## 2015-06-23 NOTE — Discharge Instructions (Signed)
Radial Site Care °Refer to this sheet in the next few weeks. These instructions provide you with information about caring for yourself after your procedure. Your health care provider may also give you more specific instructions. Your treatment has been planned according to current medical practices, but problems sometimes occur. Call your health care provider if you have any problems or questions after your procedure. °WHAT TO EXPECT AFTER THE PROCEDURE °After your procedure, it is typical to have the following: °· Bruising at the radial site that usually fades within 1-2 weeks. °· Blood collecting in the tissue (hematoma) that may be painful to the touch. It should usually decrease in size and tenderness within 1-2 weeks. °HOME CARE INSTRUCTIONS °· Take medicines only as directed by your health care provider. °· You may shower 24-48 hours after the procedure or as directed by your health care provider. Remove the bandage (dressing) and gently wash the site with plain soap and water. Pat the area dry with a clean towel. Do not rub the site, because this may cause bleeding. °· Do not take baths, swim, or use a hot tub until your health care provider approves. °· Check your insertion site every day for redness, swelling, or drainage. °· Do not apply powder or lotion to the site. °· Do not flex or bend the affected arm for 24 hours or as directed by your health care provider. °· Do not push or pull heavy objects with the affected arm for 24 hours or as directed by your health care provider. °· Do not lift over 10 lb (4.5 kg) for 5 days after your procedure or as directed by your health care provider. °· Ask your health care provider when it is okay to: °¨ Return to work or school. °¨ Resume usual physical activities or sports. °¨ Resume sexual activity. °· Do not drive home if you are discharged the same day as the procedure. Have someone else drive you. °· You may drive 24 hours after the procedure unless otherwise  instructed by your health care provider. °· Do not operate machinery or power tools for 24 hours after the procedure. °· If your procedure was done as an outpatient procedure, which means that you went home the same day as your procedure, a responsible adult should be with you for the first 24 hours after you arrive home. °· Keep all follow-up visits as directed by your health care provider. This is important. °SEEK MEDICAL CARE IF: °· You have a fever. °· You have chills. °· You have increased bleeding from the radial site. Hold pressure on the site. °SEEK IMMEDIATE MEDICAL CARE IF: °· You have unusual pain at the radial site. °· You have redness, warmth, or swelling at the radial site. °· You have drainage (other than a small amount of blood on the dressing) from the radial site. °· The radial site is bleeding, and the bleeding does not stop after 30 minutes of holding steady pressure on the site. °· Your arm or hand becomes pale, cool, tingly, or numb. °  °This information is not intended to replace advice given to you by your health care provider. Make sure you discuss any questions you have with your health care provider. °  °Document Released: 07/21/2010 Document Revised: 07/09/2014 Document Reviewed: 01/04/2014 °Elsevier Interactive Patient Education ©2016 [employer] ° °

## 2015-06-24 MED FILL — Labetalol HCl IV Soln 5 MG/ML: INTRAVENOUS | Qty: 4 | Status: AC

## 2015-06-24 MED FILL — Lidocaine Inj 1% w/ Epinephrine-1:100000: INTRAMUSCULAR | Qty: 20 | Status: AC

## 2015-06-29 ENCOUNTER — Ambulatory Visit (HOSPITAL_COMMUNITY)
Admission: RE | Admit: 2015-06-29 | Discharge: 2015-06-29 | Disposition: A | Discharge status: Home / Self Care | Payer: BLUE CROSS/BLUE SHIELD | Source: Ambulatory Visit | Attending: Surgery | Admitting: Surgery

## 2015-06-29 ENCOUNTER — Encounter (HOSPITAL_COMMUNITY): Payer: Self-pay

## 2015-06-29 ENCOUNTER — Encounter (HOSPITAL_COMMUNITY)
Admission: RE | Admit: 2015-06-29 | Discharge: 2015-06-29 | Disposition: A | Discharge status: Home / Self Care | Payer: BLUE CROSS/BLUE SHIELD | Source: Ambulatory Visit | Attending: Surgery | Admitting: Surgery

## 2015-06-29 DIAGNOSIS — Z01818 Encounter for other preprocedural examination: Secondary | ICD-10-CM | POA: Insufficient documentation

## 2015-06-29 DIAGNOSIS — I77810 Thoracic aortic ectasia: Secondary | ICD-10-CM

## 2015-06-29 DIAGNOSIS — Z01812 Encounter for preprocedural laboratory examination: Secondary | ICD-10-CM | POA: Insufficient documentation

## 2015-06-29 DIAGNOSIS — I6523 Occlusion and stenosis of bilateral carotid arteries: Secondary | ICD-10-CM | POA: Insufficient documentation

## 2015-06-29 LAB — COMPREHENSIVE METABOLIC PANEL
ALBUMIN: 3.9 g/dL (ref 3.5–5.0)
ALT: 32 U/L (ref 17–63)
AST: 37 U/L (ref 15–41)
Alkaline Phosphatase: 62 U/L (ref 38–126)
Anion gap: 9 (ref 5–15)
BUN: 18 mg/dL (ref 6–20)
CHLORIDE: 109 mmol/L (ref 101–111)
CO2: 20 mmol/L — AB (ref 22–32)
Calcium: 9.3 mg/dL (ref 8.9–10.3)
Creatinine, Ser: 1.31 mg/dL — ABNORMAL HIGH (ref 0.61–1.24)
GFR calc Af Amer: >60 mL/min (ref >60)
GFR calc non Af Amer: 57 mL/min — ABNORMAL LOW (ref >60)
GLUCOSE: 106 mg/dL — AB (ref 65–99)
POTASSIUM: 4.3 mmol/L (ref 3.5–5.1)
SODIUM: 138 mmol/L (ref 135–145)
Total Bilirubin: 0.8 mg/dL (ref 0.3–1.2)
Total Protein: 7.3 g/dL (ref 6.5–8.1)

## 2015-06-29 LAB — PULMONARY FUNCTION TEST
DL/VA % pred: 78 %
DL/VA: 3.71 ml/min/mmHg/L
DLCO UNC % PRED: 73 %
DLCO UNC: 25.69 ml/min/mmHg
DLCO cor % pred: 74 %
DLCO cor: 26.14 ml/min/mmHg
FEF 25-75 PRE: 2.34 L/s
FEF 25-75 Post: 3 L/sec
FEF2575-%Change-Post: 28 %
FEF2575-%Pred-Post: 99 %
FEF2575-%Pred-Pre: 77 %
FEV1-%CHANGE-POST: 9 %
FEV1-%PRED-POST: 99 %
FEV1-%PRED-PRE: 90 %
FEV1-POST: 3.74 L
FEV1-PRE: 3.43 L
FEV1FVC-%Change-Post: 6 %
FEV1FVC-%Pred-Pre: 91 %
FEV6-%CHANGE-POST: 4 %
FEV6-%PRED-POST: 103 %
FEV6-%Pred-Pre: 98 %
FEV6-POST: 4.96 L
FEV6-PRE: 4.73 L
FEV6FVC-%Change-Post: 2 %
FEV6FVC-%PRED-POST: 102 %
FEV6FVC-%PRED-PRE: 100 %
FVC-%CHANGE-POST: 1 %
FVC-%Pred-Post: 101 %
FVC-%Pred-Pre: 99 %
FVC-Post: 5.09 L
FVC-Pre: 4.99 L
POST FEV6/FVC RATIO: 98 %
PRE FEV6/FVC RATIO: 95 %
Post FEV1/FVC ratio: 74 %
Pre FEV1/FVC ratio: 69 %
RV % PRED: 106 %
RV: 2.56 L
TLC % PRED: 100 %
TLC: 7.44 L

## 2015-06-29 LAB — URINALYSIS, ROUTINE W REFLEX MICROSCOPIC
BILIRUBIN URINE: NEGATIVE
Glucose, UA: NEGATIVE mg/dL
HGB URINE DIPSTICK: NEGATIVE
KETONES UR: NEGATIVE mg/dL
Leukocytes, UA: NEGATIVE
NITRITE: NEGATIVE
Protein, ur: NEGATIVE mg/dL
Specific Gravity, Urine: 1.011 (ref 1.005–1.030)
pH: 6 (ref 5.0–8.0)

## 2015-06-29 LAB — SURGICAL PCR SCREEN
MRSA, PCR: NEGATIVE
Staphylococcus aureus: NEGATIVE

## 2015-06-29 LAB — BLOOD GAS, ARTERIAL
Acid-base deficit: 2.6 mmol/L — ABNORMAL HIGH (ref 0.0–2.0)
BICARBONATE: 20.9 meq/L (ref 20.0–24.0)
DRAWN BY: 206361
FIO2: 0.21
O2 Saturation: 97.3 %
PCO2 ART: 31.3 mmHg — AB (ref 35.0–45.0)
PH ART: 7.44 (ref 7.350–7.450)
Patient temperature: 98.6
TCO2: 21.9 mmol/L (ref 0–100)
pO2, Arterial: 94.6 mmHg (ref 80.0–100.0)

## 2015-06-29 LAB — CBC
HEMATOCRIT: 41.6 % (ref 39.0–52.0)
Hemoglobin: 14 g/dL (ref 13.0–17.0)
MCH: 29.4 pg (ref 26.0–34.0)
MCHC: 33.7 g/dL (ref 30.0–36.0)
MCV: 87.4 fL (ref 78.0–100.0)
Platelets: 197 10*3/uL (ref 150–400)
RBC: 4.76 MIL/uL (ref 4.22–5.81)
RDW: 13.6 % (ref 11.5–15.5)
WBC: 7 10*3/uL (ref 4.0–10.5)

## 2015-06-29 LAB — APTT: APTT: 31 s (ref 24–37)

## 2015-06-29 LAB — PROTIME-INR
INR: 1.04 (ref 0.00–1.49)
Prothrombin Time: 13.8 seconds (ref 11.6–15.2)

## 2015-06-29 LAB — ABO/RH: ABO/RH(D): A NEG

## 2015-06-29 MED ORDER — ALBUTEROL SULFATE (2.5 MG/3ML) 0.083% IN NEBU
2.5000 mg | INHALATION_SOLUTION | Freq: Once | RESPIRATORY_TRACT | Status: AC
  Administered 2015-06-29: 2.5 mg via RESPIRATORY_TRACT

## 2015-06-29 NOTE — Progress Notes (Signed)
PCP is [name]  Cardiologist is Daneen Schick  Oncologist is Elyse Hsu  Nephrologist is [employer]. LOV noted in EPIC on 03/14/15.   Patient denied having any acute cardiac or pulmonary issues  Patient informed Nurse that his right kidney was removed due to cancer

## 2015-06-29 NOTE — Pre-Procedure Instructions (Signed)
Aaron Cole  06/29/2015     Your procedure is scheduled on : Tuesday July 05, 2015 at 7:30 AM.  Report to [facility] Admitting at 5:30 AM.  Call this number if you have problems the morning of surgery: [phone]    Remember:  Do not eat food or drink liquids after midnight.  Take these medicines the morning of surgery with A SIP OF WATER : NONE  May use Flonase nasal spray   Stop taking any vitamins, herbal medications, Ibuprofen, Advil, Motrin, Aleve, etc    Do not wear jewelry.  Do not wear lotions, powders, or cologne.    Men may shave face and neck.  Do not bring valuables to the hospital.  [facility] is not responsible for any belongings or valuables.  Contacts, dentures or bridgework may not be worn into surgery.  Leave your suitcase in the car.  After surgery it may be brought to your room.  For patients admitted to the hospital, discharge time will be determined by your treatment team.  Patients discharged the day of surgery will not be allowed to drive home.   Name and phone number of your driver:    Special instructions:  Shower using CHG soap the night before and the morning of your surgery  Please read over the following fact sheets that you were given. Pain Booklet, Coughing and Deep Breathing, Blood Transfusion Information, Open Heart Packet, MRSA Information and Surgical Site Infection Prevention

## 2015-06-29 NOTE — Progress Notes (Signed)
VASCULAR LAB PRELIMINARY  PRELIMINARY  PRELIMINARY  PRELIMINARY  Pre-op Cardiac Surgery  Carotid Findings:  Bilateral:  1-39% ICA stenosis.  Vertebral artery flow is antegrade.     Upper Extremity Right Left  Brachial Pressures 149 Triphasic 152 Triphasic  Radial Waveforms Triphasic Triphasci  Ulnar Waveforms Triphasic Triphasic  Palmar Arch (Allen's Test) Normal Normal   Findings:  Doppler waveforms remained normal bilaterally with both radial and ulnar compressions    Lower  Extremity Right Left  Dorsalis Pedis 168 Triphasic 171 Triphasic  Posterior Tibial 190 Triphasic 184 Triphasic  Ankle/Brachial Indices 1.25 1.21    Findings:  ABIs and Doppler waveforms are within normal limits bilaterally at rest.   Deztinee Lohmeyer, RVS 06/29/2015, 12:12 PM

## 2015-06-30 LAB — HEMOGLOBIN A1C
HEMOGLOBIN A1C: 5.6 % (ref 4.8–5.6)
Mean Plasma Glucose: 114 mg/dL

## 2015-06-30 NOTE — Progress Notes (Addendum)
Anesthesia chart review: Patient is a 64 year old male scheduled for Bentall procedure by Dr. Cyndia Bent on 07/05/2015.  History includes nonsmoker, hypertension, CAD (widely patent coronaries on 06/23/15 cath), diverticulosis, renal cell carcinoma s/p right nephrectomy '13, chronic kidney disease stage III, nasal sinus surgery, 5.2 cm thoracic ascending aortic aneurysm. Echo suggestive of bicuspid AV.  PCP is [name]. Cardiologist is Dr. Daneen Schick. Urologist is Dr. Elyse Hsu. Nephrologist is with [facility]. Last visit with Tawni Levy, ANP on 03/14/15.   All meds are currently listed as being on hold.  ARB was held by Dr. Tamala Julian prior to his LHC due to CKD with history of nephrectomy.   06/21/15 EKG: NSR, LAD, moderate voltage criteria for LVH, may be normal variant.  06/23/15 RHC/LHC: 1. Widely patent/normal coronary arteries. Left dominant coronary system. 2. Normal pulmonary artery pressures. 3. Unable to cross aortic valve due to aorto innominate artery tortuosity and loss of catheter torque control.  06/02/15 Echo: Study Conclusions - Left ventricle: The cavity size was normal. Systolic function was normal. The estimated ejection fraction was in the range of 60% to 65%. Wall motion was normal; there were no regional wall motion abnormalities. Doppler parameters are consistent with abnormal left ventricular relaxation (grade 1 diastolic dysfunction). - Aortic valve: Images suggestive of bicuspid aortic valve. Leaflets are mildly thickened. There was mild regurgitation. - Aorta: Severely dilated ascending aorta, estimated at diameter: 52 mm (S). Distal ascending aorta estimated at 4 cm, proximal arch 3.8 cm. - Aortic root: The aortic root was moderately dilated at 4.6 cm. - Mitral valve: There was mild regurgitation.  06/29/15 Carotid U/S: Bilateral - 1% to 39% ICA stenosis. Vertebral artery flow is antegrade.  06/29/15 PFTs: FVC 4.99  (99%), FEV1 3.43 (90%), DLCOunc 25.69 (73%).  Preoperative labs noted. Cr 1.31.   Of note, BP was not recorded at PAT but was significantly elevated on the day of his cardiac cath (DBP 90's-110's). Prior to his cath, BP was recorded as 152/98 on 06/21/15 and 148/98 on 06/14/15. I have sent a staff message to [place] regarding BP so she can forward to surgeon and determine if and what medications need to be started/resumed prior to surgery (may have to touch base with cardiology).  George Hugh [facility] Short Stay Center/Anesthesiology Phone [phone] 06/30/2015 1:45 PM  Addendum: Dr. Tamala Julian is having patient resume Micardis with plans to start b-blocker post-operative (also due for Lopressor 12.5 mg on the morning of surgery).  George Hugh [facility] Short Stay Center/Anesthesiology Phone [phone] 07/01/2015 1:47 PM

## 2015-07-01 ENCOUNTER — Telehealth: Payer: Self-pay | Admitting: *Deleted

## 2015-07-01 NOTE — Telephone Encounter (Signed)
Was called by short stay about patient being hypertensive pre-cath.  I heard back from Dr. Tamala Julian saying "Start Micardis now. We held for cath due to kidney. Metoprolol can be started post surgery.  He already has Micardis. Should resume today, 12/30."  I just called the patient to give him this info and he said he already restarted it after his cath.  No further questions.  Patient will be at the hospital Tuesday morning for surgery with Dr. Cyndia Bent.

## 2015-07-04 MED ORDER — CHLORHEXIDINE GLUCONATE 0.12 % MT SOLN
15.0000 mL | Freq: Once | OROMUCOSAL | Status: AC
  Administered 2015-07-05: 15 mL via OROMUCOSAL
  Filled 2015-07-04: qty 15

## 2015-07-04 MED ORDER — PLASMA-LYTE 148 IV SOLN
INTRAVENOUS | Status: AC
  Administered 2015-07-05: 500 mL
  Filled 2015-07-04: qty 2.5

## 2015-07-04 MED ORDER — SODIUM CHLORIDE 0.9 % IV SOLN
INTRAVENOUS | Status: DC
  Filled 2015-07-04: qty 30

## 2015-07-04 MED ORDER — PHENYLEPHRINE HCL 10 MG/ML IJ SOLN
30.0000 ug/min | INTRAMUSCULAR | Status: DC
  Filled 2015-07-04: qty 2

## 2015-07-04 MED ORDER — SODIUM CHLORIDE 0.9 % IV SOLN
INTRAVENOUS | Status: DC
  Filled 2015-07-04: qty 2.5

## 2015-07-04 MED ORDER — CHLORHEXIDINE GLUCONATE 4 % EX LIQD: 30.0000 mL | CUTANEOUS | Status: DC

## 2015-07-04 MED ORDER — VANCOMYCIN HCL 10 G IV SOLR
1500.0000 mg | INTRAVENOUS | Status: AC
  Administered 2015-07-05: 60 mg via INTRAVENOUS
  Filled 2015-07-04: qty 1500

## 2015-07-04 MED ORDER — DEXMEDETOMIDINE HCL IN NACL 400 MCG/100ML IV SOLN
0.1000 ug/kg/h | INTRAVENOUS | Status: DC
  Filled 2015-07-04: qty 100

## 2015-07-04 MED ORDER — DEXTROSE 5 % IV SOLN
1.5000 g | INTRAVENOUS | Status: AC
  Administered 2015-07-05: .75 g via INTRAVENOUS
  Administered 2015-07-05: 1.5 g via INTRAVENOUS
  Filled 2015-07-04: qty 1.5

## 2015-07-04 MED ORDER — SODIUM CHLORIDE 0.9 % IV SOLN
INTRAVENOUS | Status: DC
  Filled 2015-07-04: qty 40

## 2015-07-04 MED ORDER — MAGNESIUM SULFATE 50 % IJ SOLN
40.0000 meq | INTRAMUSCULAR | Status: DC
  Filled 2015-07-04: qty 10

## 2015-07-04 MED ORDER — NITROGLYCERIN IN D5W 200-5 MCG/ML-% IV SOLN
2.0000 ug/min | INTRAVENOUS | Status: DC
  Filled 2015-07-04: qty 250

## 2015-07-04 MED ORDER — POTASSIUM CHLORIDE 2 MEQ/ML IV SOLN
80.0000 meq | INTRAVENOUS | Status: DC
Start: 2015-07-05 — End: 2015-07-05
  Filled 2015-07-04: qty 40

## 2015-07-04 MED ORDER — METOPROLOL TARTRATE 12.5 MG HALF TABLET
12.5000 mg | ORAL_TABLET | Freq: Once | ORAL | Status: AC
  Administered 2015-07-05: 12.5 mg via ORAL
  Filled 2015-07-04: qty 1

## 2015-07-04 MED ORDER — DEXTROSE 5 % IV SOLN
750.0000 mg | INTRAVENOUS | Status: DC
  Filled 2015-07-04: qty 750

## 2015-07-04 MED ORDER — DEXTROSE 5 % IV SOLN
0.0000 ug/min | INTRAVENOUS | Status: DC
  Filled 2015-07-04: qty 4

## 2015-07-04 MED ORDER — DOPAMINE-DEXTROSE 3.2-5 MG/ML-% IV SOLN
0.0000 ug/kg/min | INTRAVENOUS | Status: DC
  Filled 2015-07-04: qty 250

## 2015-07-04 NOTE — Anesthesia Preprocedure Evaluation (Addendum)
Anesthesia Evaluation  Patient identified by MRN, date of birth, ID band Patient awake    Reviewed: Allergy & Precautions, NPO status , Patient's Chart, lab work & pertinent test results  Airway Mallampati: III  TM Distance: >3 FB Neck ROM: Full    Dental  (+) Teeth Intact, Dental Advisory Given   Pulmonary neg pulmonary ROS,    breath sounds clear to auscultation       Cardiovascular hypertension, Pt. on medications + CAD and + Peripheral Vascular Disease   Rhythm:Regular Rate:Normal     Neuro/Psych  Headaches, negative psych ROS   GI/Hepatic negative GI ROS, Neg liver ROS,   Endo/Other  negative endocrine ROS  Renal/GU CRFRenal disease  negative genitourinary   Musculoskeletal negative musculoskeletal ROS (+)   Abdominal   Peds  Hematology negative hematology ROS (+)   Anesthesia Other Findings   Reproductive/Obstetrics negative OB ROS                           Lab Results  Component Value Date   WBC 7.0 06/29/2015   HGB 14.0 06/29/2015   HCT 41.6 06/29/2015   MCV 87.4 06/29/2015   PLT 197 06/29/2015   Lab Results  Component Value Date   CREATININE 1.31* 06/29/2015   BUN 18 06/29/2015   NA 138 06/29/2015   K 4.3 06/29/2015   CL 109 06/29/2015   CO2 20* 06/29/2015   06/2015 EKG: normal sinus rhythm.  06/2015: Echo - Left ventricle: The cavity size was normal. Systolic function was normal. The estimated ejection fraction was in the range of 60% to 65%. Wall motion was normal; there were no regional wall motion abnormalities. Doppler parameters are consistent with abnormal left ventricular relaxation (grade 1 diastolic dysfunction). - Aortic valve: Images suggestive of bicuspid aortic valve. Leaflets are mildly thickened. There was mild regurgitation. - Aorta: Severely dilated ascending aorta, estimated at diameter: 52 mm (S). Distal ascending aorta estimated  at 4 cm, proximal arch 3.8 cm. - Aortic root: The aortic root was moderately dilated at 4.6 cm. - Mitral valve: There was mild regurgitation.  Anesthesia Physical Anesthesia Plan  ASA: IV  Anesthesia Plan: General   Post-op Pain Management:    Induction: Intravenous  Airway Management Planned: Oral ETT  Additional Equipment: Arterial line, CVP, PA Cath, TEE and Ultrasound Guidance Line Placement  Intra-op Plan:   Post-operative Plan: Post-operative intubation/ventilation  Informed Consent: I have reviewed the patients History and Physical, chart, labs and discussed the procedure including the risks, benefits and alternatives for the proposed anesthesia with the patient or authorized representative who has indicated his/her understanding and acceptance.   Dental advisory given  Plan Discussed with: CRNA, Anesthesiologist and Surgeon  Anesthesia Plan Comments:        Anesthesia Quick Evaluation

## 2015-07-05 ENCOUNTER — Inpatient Hospital Stay (HOSPITAL_COMMUNITY): Payer: BLUE CROSS/BLUE SHIELD | Admitting: Vascular Surgery

## 2015-07-05 ENCOUNTER — Inpatient Hospital Stay (HOSPITAL_COMMUNITY): Payer: BLUE CROSS/BLUE SHIELD | Admitting: Certified Registered Nurse Anesthetist

## 2015-07-05 ENCOUNTER — Encounter (HOSPITAL_COMMUNITY)
Admission: RE | Disposition: A | Discharge status: Home / Self Care | Payer: BLUE CROSS/BLUE SHIELD | Source: Ambulatory Visit | Attending: Surgery

## 2015-07-05 ENCOUNTER — Encounter (HOSPITAL_COMMUNITY): Payer: Self-pay | Admitting: *Deleted

## 2015-07-05 ENCOUNTER — Inpatient Hospital Stay (HOSPITAL_COMMUNITY)
Admission: RE | Admit: 2015-07-05 | Discharge: 2015-07-15 | DRG: 219 | Disposition: A | Discharge status: Home / Self Care | Payer: BLUE CROSS/BLUE SHIELD | Source: Ambulatory Visit | Attending: Surgery | Admitting: Surgery

## 2015-07-05 ENCOUNTER — Inpatient Hospital Stay (HOSPITAL_COMMUNITY): Payer: BLUE CROSS/BLUE SHIELD

## 2015-07-05 DIAGNOSIS — J939 Pneumothorax, unspecified: Secondary | ICD-10-CM

## 2015-07-05 DIAGNOSIS — Z7982 Long term (current) use of aspirin: Secondary | ICD-10-CM | POA: Diagnosis not present

## 2015-07-05 DIAGNOSIS — Z905 Acquired absence of kidney: Secondary | ICD-10-CM

## 2015-07-05 DIAGNOSIS — Z85528 Personal history of other malignant neoplasm of kidney: Secondary | ICD-10-CM

## 2015-07-05 DIAGNOSIS — K5900 Constipation, unspecified: Secondary | ICD-10-CM | POA: Diagnosis not present

## 2015-07-05 DIAGNOSIS — E8770 Fluid overload, unspecified: Secondary | ICD-10-CM | POA: Diagnosis not present

## 2015-07-05 DIAGNOSIS — D6959 Other secondary thrombocytopenia: Secondary | ICD-10-CM | POA: Diagnosis not present

## 2015-07-05 DIAGNOSIS — Z952 Presence of prosthetic heart valve: Secondary | ICD-10-CM

## 2015-07-05 DIAGNOSIS — I714 Abdominal aortic aneurysm, without rupture: Secondary | ICD-10-CM

## 2015-07-05 DIAGNOSIS — N183 Chronic kidney disease, stage 3 (moderate): Secondary | ICD-10-CM | POA: Diagnosis present

## 2015-07-05 DIAGNOSIS — I712 Thoracic aortic aneurysm, without rupture: Secondary | ICD-10-CM | POA: Diagnosis present

## 2015-07-05 DIAGNOSIS — I2510 Atherosclerotic heart disease of native coronary artery without angina pectoris: Secondary | ICD-10-CM | POA: Diagnosis present

## 2015-07-05 DIAGNOSIS — Z6830 Body mass index (BMI) 30.0-30.9, adult: Secondary | ICD-10-CM

## 2015-07-05 DIAGNOSIS — I129 Hypertensive chronic kidney disease with stage 1 through stage 4 chronic kidney disease, or unspecified chronic kidney disease: Secondary | ICD-10-CM | POA: Diagnosis present

## 2015-07-05 DIAGNOSIS — D62 Acute posthemorrhagic anemia: Secondary | ICD-10-CM | POA: Diagnosis not present

## 2015-07-05 DIAGNOSIS — I718 Aortic aneurysm of unspecified site, ruptured: Secondary | ICD-10-CM | POA: Diagnosis present

## 2015-07-05 DIAGNOSIS — Q231 Congenital insufficiency of aortic valve: Principal | ICD-10-CM

## 2015-07-05 DIAGNOSIS — J9811 Atelectasis: Secondary | ICD-10-CM | POA: Diagnosis not present

## 2015-07-05 DIAGNOSIS — I480 Paroxysmal atrial fibrillation: Secondary | ICD-10-CM | POA: Diagnosis not present

## 2015-07-05 DIAGNOSIS — I77810 Thoracic aortic ectasia: Secondary | ICD-10-CM

## 2015-07-05 HISTORY — PX: TEE WITHOUT CARDIOVERSION: SHX5443

## 2015-07-05 HISTORY — PX: BENTALL PROCEDURE: SHX5058

## 2015-07-05 LAB — POCT I-STAT, CHEM 8
BUN: 18 mg/dL (ref 6–20)
BUN: 16 mg/dL (ref 6–20)
BUN: 17 mg/dL (ref 6–20)
BUN: 18 mg/dL (ref 6–20)
BUN: 17 mg/dL (ref 6–20)
BUN: 18 mg/dL (ref 6–20)
BUN: 17 mg/dL (ref 6–20)
BUN: 14 mg/dL (ref 6–20)
CALCIUM ION: 0.99 mmol/L — AB (ref 1.13–1.30)
CALCIUM ION: 0.97 mmol/L — AB (ref 1.13–1.30)
CALCIUM ION: 1 mmol/L — AB (ref 1.13–1.30)
CHLORIDE: 100 mmol/L — AB (ref 101–111)
CHLORIDE: 103 mmol/L (ref 101–111)
CHLORIDE: 105 mmol/L (ref 101–111)
CHLORIDE: 106 mmol/L (ref 101–111)
CHLORIDE: 104 mmol/L (ref 101–111)
CHLORIDE: 111 mmol/L (ref 101–111)
CREATININE: 1.1 mg/dL (ref 0.61–1.24)
CREATININE: 0.9 mg/dL (ref 0.61–1.24)
CREATININE: 0.9 mg/dL (ref 0.61–1.24)
Calcium, Ion: 1.22 mmol/L (ref 1.13–1.30)
Calcium, Ion: 1.2 mmol/L (ref 1.13–1.30)
Calcium, Ion: 1.01 mmol/L — ABNORMAL LOW (ref 1.13–1.30)
Calcium, Ion: 1 mmol/L — ABNORMAL LOW (ref 1.13–1.30)
Calcium, Ion: 1.2 mmol/L (ref 1.13–1.30)
Chloride: 107 mmol/L (ref 101–111)
Chloride: 110 mmol/L (ref 101–111)
Creatinine, Ser: 0.9 mg/dL (ref 0.61–1.24)
Creatinine, Ser: 1.1 mg/dL (ref 0.61–1.24)
Creatinine, Ser: 1 mg/dL (ref 0.61–1.24)
Creatinine, Ser: 0.9 mg/dL (ref 0.61–1.24)
Creatinine, Ser: 0.9 mg/dL (ref 0.61–1.24)
GLUCOSE: 119 mg/dL — AB (ref 65–99)
GLUCOSE: 131 mg/dL — AB (ref 65–99)
GLUCOSE: 178 mg/dL — AB (ref 65–99)
GLUCOSE: 180 mg/dL — AB (ref 65–99)
GLUCOSE: 183 mg/dL — AB (ref 65–99)
Glucose, Bld: 103 mg/dL — ABNORMAL HIGH (ref 65–99)
Glucose, Bld: 110 mg/dL — ABNORMAL HIGH (ref 65–99)
Glucose, Bld: 108 mg/dL — ABNORMAL HIGH (ref 65–99)
HCT: 30 % — ABNORMAL LOW (ref 39.0–52.0)
HCT: 31 % — ABNORMAL LOW (ref 39.0–52.0)
HCT: 32 % — ABNORMAL LOW (ref 39.0–52.0)
HCT: 29 % — ABNORMAL LOW (ref 39.0–52.0)
HEMATOCRIT: 38 % — AB (ref 39.0–52.0)
HEMATOCRIT: 27 % — AB (ref 39.0–52.0)
HEMATOCRIT: 36 % — AB (ref 39.0–52.0)
HEMATOCRIT: 25 % — AB (ref 39.0–52.0)
HEMOGLOBIN: 12.9 g/dL — AB (ref 13.0–17.0)
HEMOGLOBIN: 12.2 g/dL — AB (ref 13.0–17.0)
HEMOGLOBIN: 10.5 g/dL — AB (ref 13.0–17.0)
HEMOGLOBIN: 10.9 g/dL — AB (ref 13.0–17.0)
Hemoglobin: 9.2 g/dL — ABNORMAL LOW (ref 13.0–17.0)
Hemoglobin: 10.2 g/dL — ABNORMAL LOW (ref 13.0–17.0)
Hemoglobin: 9.9 g/dL — ABNORMAL LOW (ref 13.0–17.0)
Hemoglobin: 8.5 g/dL — ABNORMAL LOW (ref 13.0–17.0)
POTASSIUM: 4 mmol/L (ref 3.5–5.1)
POTASSIUM: 4.9 mmol/L (ref 3.5–5.1)
POTASSIUM: 5.3 mmol/L — AB (ref 3.5–5.1)
POTASSIUM: 5.7 mmol/L — AB (ref 3.5–5.1)
Potassium: 4 mmol/L (ref 3.5–5.1)
Potassium: 3.9 mmol/L (ref 3.5–5.1)
Potassium: 4.4 mmol/L (ref 3.5–5.1)
Potassium: 3.8 mmol/L (ref 3.5–5.1)
SODIUM: 141 mmol/L (ref 135–145)
SODIUM: 137 mmol/L (ref 135–145)
SODIUM: 139 mmol/L (ref 135–145)
SODIUM: 137 mmol/L (ref 135–145)
SODIUM: 140 mmol/L (ref 135–145)
Sodium: 137 mmol/L (ref 135–145)
Sodium: 137 mmol/L (ref 135–145)
Sodium: 140 mmol/L (ref 135–145)
TCO2: 26 mmol/L (ref 0–100)
TCO2: 23 mmol/L (ref 0–100)
TCO2: 29 mmol/L (ref 0–100)
TCO2: 25 mmol/L (ref 0–100)
TCO2: 22 mmol/L (ref 0–100)
TCO2: 23 mmol/L (ref 0–100)
TCO2: 21 mmol/L (ref 0–100)
TCO2: 19 mmol/L (ref 0–100)

## 2015-07-05 LAB — POCT I-STAT 3, ART BLOOD GAS (G3+)
Acid-Base Excess: 2 mmol/L (ref 0.0–2.0)
Acid-base deficit: 1 mmol/L (ref 0.0–2.0)
Acid-base deficit: 4 mmol/L — ABNORMAL HIGH (ref 0.0–2.0)
BICARBONATE: 26.5 meq/L — AB (ref 20.0–24.0)
Bicarbonate: 23.2 mEq/L (ref 20.0–24.0)
Bicarbonate: 20.4 mEq/L (ref 20.0–24.0)
O2 SAT: 99 %
O2 Saturation: 100 %
O2 Saturation: 100 %
PCO2 ART: 37.5 mmHg (ref 35.0–45.0)
PCO2 ART: 40.6 mmHg (ref 35.0–45.0)
PH ART: 7.401 (ref 7.350–7.450)
PH ART: 7.423 (ref 7.350–7.450)
PO2 ART: 280 mmHg — AB (ref 80.0–100.0)
TCO2: 24 mmol/L (ref 0–100)
TCO2: 28 mmol/L (ref 0–100)
TCO2: 21 mmol/L (ref 0–100)
pCO2 arterial: 33.2 mmHg — ABNORMAL LOW (ref 35.0–45.0)
pH, Arterial: 7.393 (ref 7.350–7.450)
pO2, Arterial: 320 mmHg — ABNORMAL HIGH (ref 80.0–100.0)
pO2, Arterial: 126 mmHg — ABNORMAL HIGH (ref 80.0–100.0)

## 2015-07-05 LAB — GLUCOSE, CAPILLARY
GLUCOSE-CAPILLARY: 111 mg/dL — AB (ref 65–99)
GLUCOSE-CAPILLARY: 113 mg/dL — AB (ref 65–99)
GLUCOSE-CAPILLARY: 122 mg/dL — AB (ref 65–99)
GLUCOSE-CAPILLARY: 93 mg/dL (ref 65–99)
Glucose-Capillary: 112 mg/dL — ABNORMAL HIGH (ref 65–99)
Glucose-Capillary: 97 mg/dL (ref 65–99)

## 2015-07-05 LAB — PROTIME-INR
INR: 1.6 — AB (ref 0.00–1.49)
INR: 1.51 — AB (ref 0.00–1.49)
Prothrombin Time: 19.1 seconds — ABNORMAL HIGH (ref 11.6–15.2)
Prothrombin Time: 18.3 seconds — ABNORMAL HIGH (ref 11.6–15.2)

## 2015-07-05 LAB — PREPARE PLATELET PHERESIS: Unit division: 0

## 2015-07-05 LAB — CBC
HEMATOCRIT: 34.5 % — AB (ref 39.0–52.0)
HEMATOCRIT: 26.2 % — AB (ref 39.0–52.0)
HEMOGLOBIN: 12 g/dL — AB (ref 13.0–17.0)
HEMOGLOBIN: 9 g/dL — AB (ref 13.0–17.0)
MCH: 29.7 pg (ref 26.0–34.0)
MCH: 28.9 pg (ref 26.0–34.0)
MCHC: 34.8 g/dL (ref 30.0–36.0)
MCHC: 34.4 g/dL (ref 30.0–36.0)
MCV: 85.4 fL (ref 78.0–100.0)
MCV: 84.2 fL (ref 78.0–100.0)
PLATELETS: 157 10*3/uL (ref 150–400)
Platelets: 101 10*3/uL — ABNORMAL LOW (ref 150–400)
RBC: 4.04 MIL/uL — ABNORMAL LOW (ref 4.22–5.81)
RBC: 3.11 MIL/uL — AB (ref 4.22–5.81)
RDW: 13.5 % (ref 11.5–15.5)
RDW: 13.4 % (ref 11.5–15.5)
WBC: 14.8 10*3/uL — ABNORMAL HIGH (ref 4.0–10.5)
WBC: 13.7 10*3/uL — ABNORMAL HIGH (ref 4.0–10.5)

## 2015-07-05 LAB — FIBRINOGEN
FIBRINOGEN: 173 mg/dL — AB (ref 204–475)
Fibrinogen: 230 mg/dL (ref 204–475)

## 2015-07-05 LAB — HEMOGLOBIN AND HEMATOCRIT, BLOOD
HCT: 30 % — ABNORMAL LOW (ref 39.0–52.0)
Hemoglobin: 10.4 g/dL — ABNORMAL LOW (ref 13.0–17.0)

## 2015-07-05 LAB — POCT I-STAT 4, (NA,K, GLUC, HGB,HCT)
Glucose, Bld: 110 mg/dL — ABNORMAL HIGH (ref 65–99)
HCT: 35 % — ABNORMAL LOW (ref 39.0–52.0)
Hemoglobin: 11.9 g/dL — ABNORMAL LOW (ref 13.0–17.0)
Potassium: 3.8 mmol/L (ref 3.5–5.1)
Sodium: 140 mmol/L (ref 135–145)

## 2015-07-05 LAB — CREATININE, SERUM
Creatinine, Ser: 1.17 mg/dL (ref 0.61–1.24)
GFR calc Af Amer: >60 mL/min (ref >60)
GFR calc non Af Amer: >60 mL/min (ref >60)

## 2015-07-05 LAB — APTT: APTT: 47 s — AB (ref 24–37)

## 2015-07-05 LAB — MAGNESIUM: Magnesium: 3.3 mg/dL — ABNORMAL HIGH (ref 1.7–2.4)

## 2015-07-05 LAB — PLATELET COUNT: Platelets: 112 10*3/uL — ABNORMAL LOW (ref 150–400)

## 2015-07-05 SURGERY — BENTALL PROCEDURE
Anesthesia: General | Site: Chest

## 2015-07-05 MED ORDER — ONDANSETRON HCL 4 MG/2ML IJ SOLN
4.0000 mg | Freq: Four times a day (QID) | INTRAMUSCULAR | Status: DC | PRN
  Administered 2015-07-06 – 2015-07-11 (×5): 4 mg via INTRAVENOUS
  Filled 2015-07-05 (×6): qty 2

## 2015-07-05 MED ORDER — THROMBIN 20000 UNITS EX SOLR
CUTANEOUS | Status: DC | PRN
  Administered 2015-07-05: 20000 [IU] via TOPICAL

## 2015-07-05 MED ORDER — SODIUM CHLORIDE 0.9 % IV SOLN
INTRAVENOUS | Status: DC
  Filled 2015-07-05: qty 2.5

## 2015-07-05 MED ORDER — POTASSIUM CHLORIDE 10 MEQ/50ML IV SOLN: 10.0000 meq | INTRAVENOUS | Status: AC

## 2015-07-05 MED ORDER — CALCIUM CHLORIDE 10 % IV SOLN
1.0000 g | Freq: Once | INTRAVENOUS | Status: AC
  Administered 2015-07-05: 1 g via INTRAVENOUS

## 2015-07-05 MED ORDER — ACETAMINOPHEN 160 MG/5ML PO SOLN
1000.0000 mg | Freq: Four times a day (QID) | ORAL | Status: AC
  Administered 2015-07-05: 1000 mg

## 2015-07-05 MED ORDER — INSULIN REGULAR BOLUS VIA INFUSION
0.0000 [IU] | Freq: Three times a day (TID) | INTRAVENOUS | Status: DC
  Filled 2015-07-05: qty 10

## 2015-07-05 MED ORDER — SODIUM CHLORIDE 0.9 % IV SOLN
250.0000 mL | INTRAVENOUS | Status: DC
  Administered 2015-07-06: 250 mL via INTRAVENOUS

## 2015-07-05 MED ORDER — BISACODYL 10 MG RE SUPP: 10.0000 mg | Freq: Every day | RECTAL | Status: DC

## 2015-07-05 MED ORDER — SODIUM CHLORIDE 0.9 % IV SOLN
Freq: Once | INTRAVENOUS | Status: AC
  Administered 2015-07-05: 15:00:00 via INTRAVENOUS

## 2015-07-05 MED ORDER — PROTAMINE SULFATE 10 MG/ML IV SOLN
INTRAVENOUS | Status: AC
  Filled 2015-07-05: qty 10

## 2015-07-05 MED ORDER — ASPIRIN 81 MG PO CHEW
324.0000 mg | CHEWABLE_TABLET | Freq: Every day | ORAL | Status: DC
Start: 2015-07-06 — End: 2015-07-06

## 2015-07-05 MED ORDER — METOPROLOL TARTRATE 25 MG/10 ML ORAL SUSPENSION: 12.5000 mg | Freq: Two times a day (BID) | ORAL | Status: DC

## 2015-07-05 MED ORDER — SODIUM CHLORIDE 0.9 % IJ SOLN
OROMUCOSAL | Status: DC | PRN
  Administered 2015-07-05: 12 mL via TOPICAL

## 2015-07-05 MED ORDER — FAMOTIDINE IN NACL 20-0.9 MG/50ML-% IV SOLN
20.0000 mg | Freq: Two times a day (BID) | INTRAVENOUS | Status: AC
  Administered 2015-07-05 (×2): 20 mg via INTRAVENOUS
  Filled 2015-07-05: qty 50

## 2015-07-05 MED ORDER — LACTATED RINGERS IV SOLN: INTRAVENOUS | Status: DC

## 2015-07-05 MED ORDER — HEMOSTATIC AGENTS (NO CHARGE) OPTIME
TOPICAL | Status: DC | PRN
  Administered 2015-07-05 (×2): 1 via TOPICAL

## 2015-07-05 MED ORDER — BISACODYL 5 MG PO TBEC
10.0000 mg | DELAYED_RELEASE_TABLET | Freq: Every day | ORAL | Status: DC
  Administered 2015-07-06 – 2015-07-10 (×5): 10 mg via ORAL
  Filled 2015-07-05 (×7): qty 2

## 2015-07-05 MED ORDER — METOPROLOL TARTRATE 12.5 MG HALF TABLET: 12.5000 mg | ORAL_TABLET | Freq: Two times a day (BID) | ORAL | Status: DC

## 2015-07-05 MED ORDER — HEPARIN SODIUM (PORCINE) 1000 UNIT/ML IJ SOLN
INTRAMUSCULAR | Status: AC
  Filled 2015-07-05: qty 1

## 2015-07-05 MED ORDER — TRAMADOL HCL 50 MG PO TABS
50.0000 mg | ORAL_TABLET | ORAL | Status: DC | PRN
  Administered 2015-07-07: 100 mg via ORAL
  Filled 2015-07-05: qty 2

## 2015-07-05 MED ORDER — PROPOFOL 10 MG/ML IV BOLUS
INTRAVENOUS | Status: DC | PRN
  Administered 2015-07-05: 140 mg via INTRAVENOUS

## 2015-07-05 MED ORDER — STERILE WATER FOR INJECTION IJ SOLN
INTRAMUSCULAR | Status: AC
  Filled 2015-07-05: qty 30

## 2015-07-05 MED ORDER — MIDAZOLAM HCL 2 MG/2ML IJ SOLN: 2.0000 mg | INTRAMUSCULAR | Status: DC | PRN

## 2015-07-05 MED ORDER — LACTATED RINGERS IV SOLN
INTRAVENOUS | Status: DC | PRN
  Administered 2015-07-05 (×2): via INTRAVENOUS

## 2015-07-05 MED ORDER — DOPAMINE-DEXTROSE 3.2-5 MG/ML-% IV SOLN
INTRAVENOUS | Status: DC | PRN
  Administered 2015-07-05: 2 ug/kg/min via INTRAVENOUS

## 2015-07-05 MED ORDER — INSULIN REGULAR HUMAN 100 UNIT/ML IJ SOLN
250.0000 [IU] | INTRAMUSCULAR | Status: DC | PRN
  Administered 2015-07-05: .9 [IU]/h via INTRAVENOUS

## 2015-07-05 MED ORDER — PANTOPRAZOLE SODIUM 40 MG PO TBEC
40.0000 mg | DELAYED_RELEASE_TABLET | Freq: Every day | ORAL | Status: DC
  Administered 2015-07-07 – 2015-07-14 (×8): 40 mg via ORAL
  Filled 2015-07-05 (×8): qty 1

## 2015-07-05 MED ORDER — ROCURONIUM BROMIDE 100 MG/10ML IV SOLN
INTRAVENOUS | Status: DC | PRN
  Administered 2015-07-05 (×4): 50 mg via INTRAVENOUS

## 2015-07-05 MED ORDER — NITROGLYCERIN IN D5W 200-5 MCG/ML-% IV SOLN: 0.0000 ug/min | INTRAVENOUS | Status: DC

## 2015-07-05 MED ORDER — ASPIRIN EC 325 MG PO TBEC: 325.0000 mg | DELAYED_RELEASE_TABLET | Freq: Every day | ORAL | Status: DC

## 2015-07-05 MED ORDER — CHLORHEXIDINE GLUCONATE 0.12 % MT SOLN
15.0000 mL | OROMUCOSAL | Status: AC
  Administered 2015-07-05: 15 mL via OROMUCOSAL
  Filled 2015-07-05: qty 15

## 2015-07-05 MED ORDER — ACETAMINOPHEN 500 MG PO TABS
1000.0000 mg | ORAL_TABLET | Freq: Four times a day (QID) | ORAL | Status: AC
  Administered 2015-07-06 – 2015-07-10 (×16): 1000 mg via ORAL
  Filled 2015-07-05 (×18): qty 2

## 2015-07-05 MED ORDER — FENTANYL CITRATE (PF) 100 MCG/2ML IJ SOLN
INTRAMUSCULAR | Status: DC | PRN
  Administered 2015-07-05 (×2): 250 ug via INTRAVENOUS
  Administered 2015-07-05: 150 ug via INTRAVENOUS
  Administered 2015-07-05: 250 ug via INTRAVENOUS
  Administered 2015-07-05 (×3): 50 ug via INTRAVENOUS
  Administered 2015-07-05: 250 ug via INTRAVENOUS
  Administered 2015-07-05: 150 ug via INTRAVENOUS
  Administered 2015-07-05: 50 ug via INTRAVENOUS

## 2015-07-05 MED ORDER — PHENYLEPHRINE HCL 10 MG/ML IJ SOLN
20.0000 mg | INTRAVENOUS | Status: DC | PRN
  Administered 2015-07-05: 13.33 ug/min via INTRAVENOUS

## 2015-07-05 MED ORDER — SODIUM CHLORIDE 0.9 % IV SOLN
10.0000 mg | INTRAVENOUS | Status: DC | PRN
  Administered 2015-07-05: 40 ug/min via INTRAVENOUS

## 2015-07-05 MED ORDER — ALBUMIN HUMAN 5 % IV SOLN
250.0000 mL | INTRAVENOUS | Status: AC | PRN
  Administered 2015-07-05 (×4): 250 mL via INTRAVENOUS
  Filled 2015-07-05 (×2): qty 250

## 2015-07-05 MED ORDER — OXYCODONE HCL 5 MG PO TABS
5.0000 mg | ORAL_TABLET | ORAL | Status: DC | PRN
  Administered 2015-07-06: 10 mg via ORAL
  Administered 2015-07-06: 5 mg via ORAL
  Administered 2015-07-06 – 2015-07-11 (×11): 10 mg via ORAL
  Administered 2015-07-12: 5 mg via ORAL
  Filled 2015-07-05 (×4): qty 2
  Filled 2015-07-05: qty 1
  Filled 2015-07-05 (×3): qty 2
  Filled 2015-07-05: qty 1
  Filled 2015-07-05 (×5): qty 2

## 2015-07-05 MED ORDER — ALBUMIN HUMAN 5 % IV SOLN
12.5000 g | INTRAVENOUS | Status: DC | PRN
  Administered 2015-07-06: 12.5 g via INTRAVENOUS
  Filled 2015-07-05 (×2): qty 250

## 2015-07-05 MED ORDER — THROMBIN 20000 UNITS EX SOLR
CUTANEOUS | Status: AC
  Filled 2015-07-05: qty 20000

## 2015-07-05 MED ORDER — LACTATED RINGERS IV SOLN: 500.0000 mL | Freq: Once | INTRAVENOUS | Status: DC | PRN

## 2015-07-05 MED ORDER — ROCURONIUM BROMIDE 50 MG/5ML IV SOLN
INTRAVENOUS | Status: AC
  Filled 2015-07-05: qty 1

## 2015-07-05 MED ORDER — MIDAZOLAM HCL 10 MG/2ML IJ SOLN
INTRAMUSCULAR | Status: AC
  Filled 2015-07-05: qty 4

## 2015-07-05 MED ORDER — ACETAMINOPHEN 650 MG RE SUPP
650.0000 mg | Freq: Once | RECTAL | Status: AC
  Administered 2015-07-05: 650 mg via RECTAL

## 2015-07-05 MED ORDER — METOPROLOL TARTRATE 1 MG/ML IV SOLN: 2.5000 mg | INTRAVENOUS | Status: DC | PRN

## 2015-07-05 MED ORDER — LACTATED RINGERS IV SOLN
INTRAVENOUS | Status: DC | PRN
  Administered 2015-07-05: 07:00:00 via INTRAVENOUS

## 2015-07-05 MED ORDER — LACTATED RINGERS IV SOLN
INTRAVENOUS | Status: DC | PRN
  Administered 2015-07-05 (×2): via INTRAVENOUS

## 2015-07-05 MED ORDER — SODIUM CHLORIDE 0.9 % IV SOLN
INTRAVENOUS | Status: DC
  Administered 2015-07-05: 20 mL/h via INTRAVENOUS

## 2015-07-05 MED ORDER — ETOMIDATE 2 MG/ML IV SOLN
INTRAVENOUS | Status: DC | PRN
  Administered 2015-07-05: 2 mg via INTRAVENOUS
  Administered 2015-07-05: 18 mg via INTRAVENOUS

## 2015-07-05 MED ORDER — SODIUM CHLORIDE 0.9 % IV SOLN
200.0000 ug | INTRAVENOUS | Status: DC | PRN
  Administered 2015-07-05: 0.2 ug/kg/h via INTRAVENOUS

## 2015-07-05 MED ORDER — EPHEDRINE SULFATE 50 MG/ML IJ SOLN
INTRAMUSCULAR | Status: AC
  Filled 2015-07-05: qty 1

## 2015-07-05 MED ORDER — MORPHINE SULFATE (PF) 2 MG/ML IV SOLN
1.0000 mg | INTRAVENOUS | Status: AC | PRN
  Administered 2015-07-05 – 2015-07-06 (×3): 2 mg via INTRAVENOUS
  Filled 2015-07-05 (×2): qty 1

## 2015-07-05 MED ORDER — MAGNESIUM SULFATE 4 GM/100ML IV SOLN
4.0000 g | Freq: Once | INTRAVENOUS | Status: AC
  Administered 2015-07-05: 4 g via INTRAVENOUS
  Filled 2015-07-05: qty 100

## 2015-07-05 MED ORDER — INSULIN ASPART 100 UNIT/ML ~~LOC~~ SOLN
0.0000 [IU] | SUBCUTANEOUS | Status: DC
  Administered 2015-07-06: 2 [IU] via SUBCUTANEOUS

## 2015-07-05 MED ORDER — SUCCINYLCHOLINE CHLORIDE 20 MG/ML IJ SOLN
INTRAMUSCULAR | Status: AC
  Filled 2015-07-05: qty 1

## 2015-07-05 MED ORDER — ROCURONIUM BROMIDE 50 MG/5ML IV SOLN
INTRAVENOUS | Status: AC
  Filled 2015-07-05: qty 2

## 2015-07-05 MED ORDER — PROPOFOL 10 MG/ML IV BOLUS
INTRAVENOUS | Status: AC
  Filled 2015-07-05: qty 20

## 2015-07-05 MED ORDER — PROTAMINE SULFATE 10 MG/ML IV SOLN
INTRAVENOUS | Status: DC | PRN
  Administered 2015-07-05: 30 mg via INTRAVENOUS
  Administered 2015-07-05 (×2): 20 mg via INTRAVENOUS
  Administered 2015-07-05 (×2): 30 mg via INTRAVENOUS
  Administered 2015-07-05: 40 mg via INTRAVENOUS
  Administered 2015-07-05: 20 mg via INTRAVENOUS
  Administered 2015-07-05: 40 mg via INTRAVENOUS
  Administered 2015-07-05: 20 mg via INTRAVENOUS

## 2015-07-05 MED ORDER — PHENYLEPHRINE HCL 10 MG/ML IJ SOLN
0.0000 ug/min | INTRAVENOUS | Status: DC
  Administered 2015-07-06: 20 ug/min via INTRAVENOUS
  Filled 2015-07-05 (×2): qty 2

## 2015-07-05 MED ORDER — DEXTROSE 5 % IV SOLN
1.5000 g | Freq: Two times a day (BID) | INTRAVENOUS | Status: AC
  Administered 2015-07-05 – 2015-07-07 (×4): 1.5 g via INTRAVENOUS
  Filled 2015-07-05 (×4): qty 1.5

## 2015-07-05 MED ORDER — HEPARIN SODIUM (PORCINE) 1000 UNIT/ML IJ SOLN
INTRAMUSCULAR | Status: DC | PRN
  Administered 2015-07-05: 33 mL via INTRAVENOUS

## 2015-07-05 MED ORDER — CHLORHEXIDINE GLUCONATE 0.12% ORAL RINSE (MEDLINE KIT)
15.0000 mL | Freq: Two times a day (BID) | OROMUCOSAL | Status: DC
  Administered 2015-07-05: 15 mL via OROMUCOSAL

## 2015-07-05 MED ORDER — DOCUSATE SODIUM 100 MG PO CAPS
200.0000 mg | ORAL_CAPSULE | Freq: Every day | ORAL | Status: DC
  Administered 2015-07-06 – 2015-07-13 (×8): 200 mg via ORAL
  Filled 2015-07-05 (×9): qty 2

## 2015-07-05 MED ORDER — LIDOCAINE HCL (CARDIAC) 20 MG/ML IV SOLN
INTRAVENOUS | Status: AC
  Filled 2015-07-05: qty 5

## 2015-07-05 MED ORDER — EPHEDRINE SULFATE 50 MG/ML IJ SOLN
INTRAMUSCULAR | Status: DC | PRN
  Administered 2015-07-05: 15 mg via INTRAVENOUS

## 2015-07-05 MED ORDER — SODIUM CHLORIDE 0.9 % IV SOLN
10.0000 g | INTRAVENOUS | Status: DC | PRN
  Administered 2015-07-05: 5 g/h via INTRAVENOUS

## 2015-07-05 MED ORDER — SODIUM CHLORIDE 0.9 % IJ SOLN
3.0000 mL | Freq: Two times a day (BID) | INTRAMUSCULAR | Status: DC
  Administered 2015-07-06 – 2015-07-13 (×9): 3 mL via INTRAVENOUS

## 2015-07-05 MED ORDER — DOPAMINE-DEXTROSE 3.2-5 MG/ML-% IV SOLN: 0.0000 ug/kg/min | INTRAVENOUS | Status: DC

## 2015-07-05 MED ORDER — METHYLPREDNISOLONE SODIUM SUCC 125 MG IJ SOLR
INTRAMUSCULAR | Status: DC | PRN
  Administered 2015-07-05: 125 mg via INTRAVENOUS

## 2015-07-05 MED ORDER — VANCOMYCIN HCL IN DEXTROSE 1-5 GM/200ML-% IV SOLN
1000.0000 mg | Freq: Once | INTRAVENOUS | Status: AC
  Administered 2015-07-05: 1000 mg via INTRAVENOUS
  Filled 2015-07-05: qty 200

## 2015-07-05 MED ORDER — ANTISEPTIC ORAL RINSE SOLUTION (CORINZ)
7.0000 mL | Freq: Four times a day (QID) | OROMUCOSAL | Status: DC
  Administered 2015-07-05 – 2015-07-06 (×2): 7 mL via OROMUCOSAL

## 2015-07-05 MED ORDER — SODIUM CHLORIDE 0.45 % IV SOLN
INTRAVENOUS | Status: DC | PRN
  Administered 2015-07-05: 20 mL/h via INTRAVENOUS

## 2015-07-05 MED ORDER — DEXMEDETOMIDINE HCL IN NACL 200 MCG/50ML IV SOLN
0.0000 ug/kg/h | INTRAVENOUS | Status: DC
  Administered 2015-07-05: 0.7 ug/kg/h via INTRAVENOUS
  Filled 2015-07-05 (×2): qty 50

## 2015-07-05 MED ORDER — SODIUM CHLORIDE 0.9 % IV SOLN
Freq: Once | INTRAVENOUS | Status: AC
  Administered 2015-07-05: 13:00:00 via INTRAVENOUS

## 2015-07-05 MED ORDER — FENTANYL CITRATE (PF) 250 MCG/5ML IJ SOLN
INTRAMUSCULAR | Status: AC
  Filled 2015-07-05: qty 30

## 2015-07-05 MED ORDER — ACETAMINOPHEN 160 MG/5ML PO SOLN: 650.0000 mg | Freq: Once | ORAL | Status: AC

## 2015-07-05 MED ORDER — SODIUM CHLORIDE 0.9 % IJ SOLN
3.0000 mL | INTRAMUSCULAR | Status: DC | PRN
  Administered 2015-07-09: 3 mL via INTRAVENOUS
  Filled 2015-07-05: qty 3

## 2015-07-05 MED ORDER — MORPHINE SULFATE (PF) 2 MG/ML IV SOLN
2.0000 mg | INTRAVENOUS | Status: DC | PRN
  Administered 2015-07-06 (×2): 2 mg via INTRAVENOUS
  Administered 2015-07-06: 4 mg via INTRAVENOUS
  Administered 2015-07-06 (×2): 2 mg via INTRAVENOUS
  Administered 2015-07-07 (×2): 4 mg via INTRAVENOUS
  Filled 2015-07-05 (×2): qty 2
  Filled 2015-07-05 (×2): qty 1
  Filled 2015-07-05 (×3): qty 2

## 2015-07-05 MED ORDER — NITROGLYCERIN IN D5W 200-5 MCG/ML-% IV SOLN
INTRAVENOUS | Status: DC | PRN
  Administered 2015-07-05: 10 ug/min via INTRAVENOUS

## 2015-07-05 MED ORDER — MIDAZOLAM HCL 5 MG/5ML IJ SOLN
INTRAMUSCULAR | Status: DC | PRN
  Administered 2015-07-05: 1 mg via INTRAVENOUS
  Administered 2015-07-05: 3 mg via INTRAVENOUS
  Administered 2015-07-05: 2 mg via INTRAVENOUS
  Administered 2015-07-05: 1 mg via INTRAVENOUS
  Administered 2015-07-05: 2 mg via INTRAVENOUS
  Administered 2015-07-05: 3 mg via INTRAVENOUS
  Administered 2015-07-05: 1 mg via INTRAVENOUS
  Administered 2015-07-05: 2 mg via INTRAVENOUS

## 2015-07-05 MED ORDER — FLUTICASONE PROPIONATE 50 MCG/ACT NA SUSP
1.0000 | Freq: Every day | NASAL | Status: DC
  Administered 2015-07-07 – 2015-07-14 (×5): 1 via NASAL
  Filled 2015-07-05 (×2): qty 16

## 2015-07-05 MED ORDER — 0.9 % SODIUM CHLORIDE (POUR BTL) OPTIME
TOPICAL | Status: DC | PRN
  Administered 2015-07-05: 1000 mL
  Administered 2015-07-05: 5000 mL

## 2015-07-05 MED FILL — Magnesium Sulfate Inj 50%: INTRAMUSCULAR | Qty: 10 | Status: AC

## 2015-07-05 MED FILL — Potassium Chloride Inj 2 mEq/ML: INTRAVENOUS | Qty: 20 | Status: AC

## 2015-07-05 MED FILL — Heparin Sodium (Porcine) Inj 1000 Unit/ML: INTRAMUSCULAR | Qty: 30 | Status: AC

## 2015-07-05 SURGICAL SUPPLY — 102 items
ADAPTER CARDIO PERF ANTE/RETRO (ADAPTER) ×3 IMPLANT
ADPR PRFSN 84XANTGRD RTRGD (ADAPTER) ×2
APL SRG 7X2 LUM MLBL SLNT (VASCULAR PRODUCTS) ×4
APPLICATOR TIP COSEAL (VASCULAR PRODUCTS) ×2 IMPLANT
ATTRACTOMAT 16X20 MAGNETIC DRP (DRAPES) ×3 IMPLANT
BAG DECANTER FOR FLEXI CONT (MISCELLANEOUS) ×3 IMPLANT
BLADE STERNUM SYSTEM 6 (BLADE) ×3 IMPLANT
BLADE SURG 15 STRL LF DISP TIS (BLADE) ×2 IMPLANT
BLADE SURG 15 STRL SS (BLADE) ×3
CANISTER SUCTION 2500CC (MISCELLANEOUS) ×3 IMPLANT
CANNULA GUNDRY RCSP 15FR (MISCELLANEOUS) ×4 IMPLANT
CATH ROBINSON RED A/P 18FR (CATHETERS) ×11 IMPLANT
CATH THORACIC 36FR (CATHETERS) ×3 IMPLANT
CATH THORACIC 36FR RT ANG (CATHETERS) ×3 IMPLANT
CAUTERY HIGH TEMP VAS (MISCELLANEOUS) ×3 IMPLANT
CONT SPEC 4OZ CLIKSEAL STRL BL (MISCELLANEOUS) ×2 IMPLANT
CONT SPEC STER OR (MISCELLANEOUS) ×3 IMPLANT
COVER SURGICAL LIGHT HANDLE (MISCELLANEOUS) ×6 IMPLANT
CRADLE DONUT ADULT HEAD (MISCELLANEOUS) ×3 IMPLANT
DRAPE CARDIOVASCULAR INCISE (DRAPES) ×3
DRAPE SLUSH/WARMER DISC (DRAPES) IMPLANT
DRAPE SRG 135X102X78XABS (DRAPES) IMPLANT
DRSG COVADERM 4X10 (GAUZE/BANDAGES/DRESSINGS) ×1 IMPLANT
DRSG COVADERM 4X14 (GAUZE/BANDAGES/DRESSINGS) ×3 IMPLANT
ELECT CAUTERY BLADE 6.4 (BLADE) ×3 IMPLANT
ELECT REM PT RETURN 9FT ADLT (ELECTROSURGICAL) ×6
ELECTRODE REM PT RTRN 9FT ADLT (ELECTROSURGICAL) ×4 IMPLANT
GAUZE SPONGE 4X4 12PLY STRL (GAUZE/BANDAGES/DRESSINGS) ×3 IMPLANT
GLOVE BIO SURGEON STRL SZ 6 (GLOVE) ×2 IMPLANT
GLOVE BIO SURGEON STRL SZ 6.5 (GLOVE) ×2 IMPLANT
GLOVE BIO SURGEON STRL SZ7 (GLOVE) ×2 IMPLANT
GLOVE BIO SURGEON STRL SZ7.5 (GLOVE) ×2 IMPLANT
GLOVE EUDERMIC 7 POWDERFREE (GLOVE) ×7 IMPLANT
GOWN STRL REUS W/ TWL LRG LVL3 (GOWN DISPOSABLE) ×8 IMPLANT
GOWN STRL REUS W/ TWL XL LVL3 (GOWN DISPOSABLE) ×2 IMPLANT
GOWN STRL REUS W/TWL LRG LVL3 (GOWN DISPOSABLE) ×12
GOWN STRL REUS W/TWL XL LVL3 (GOWN DISPOSABLE) ×3
GRAFT GELWEAVE VALSALVA 28 (Prosthesis & Implant Heart) IMPLANT
GRAFT GELWEAVE VALSALVA 28CM (Prosthesis & Implant Heart) ×3 IMPLANT
GRAFT HEMASHIELD 28X40 (Vascular Products) ×3 IMPLANT
GRAFT HEMASHIELD 28X50 (Vascular Products) IMPLANT
HEART VENT LT CURVED (MISCELLANEOUS) ×3 IMPLANT
HEMOSTAT POWDER SURGIFOAM 1G (HEMOSTASIS) ×9 IMPLANT
HEMOSTAT SURGICEL 2X14 (HEMOSTASIS) ×3 IMPLANT
INSERT FOGARTY XLG (MISCELLANEOUS) ×1 IMPLANT
KIT BASIN OR (CUSTOM PROCEDURE TRAY) ×3 IMPLANT
KIT CATH CPB BARTLE (MISCELLANEOUS) ×3 IMPLANT
KIT ROOM TURNOVER OR (KITS) ×3 IMPLANT
KIT SUCTION CATH 14FR (SUCTIONS) ×3 IMPLANT
LINE VENT (MISCELLANEOUS) ×1 IMPLANT
LOOP VESSEL SUPERMAXI WHITE (MISCELLANEOUS) ×1 IMPLANT
NS IRRIG 1000ML POUR BTL (IV SOLUTION) ×15 IMPLANT
PACK OPEN HEART (CUSTOM PROCEDURE TRAY) ×3 IMPLANT
PAD ARMBOARD 7.5X6 YLW CONV (MISCELLANEOUS) ×6 IMPLANT
SEALANT SURG COSEAL 8ML (VASCULAR PRODUCTS) ×2 IMPLANT
SET CARDIOPLEGIA MPS 5001102 (MISCELLANEOUS) ×1 IMPLANT
SET VEIN GRAFT PERF (SET/KITS/TRAYS/PACK) ×1 IMPLANT
SUT BONE WAX W31G (SUTURE) ×3 IMPLANT
SUT ETHIBON 2 0 V 52N 30 (SUTURE) ×6 IMPLANT
SUT ETHIBON EXCEL 2-0 V-5 (SUTURE) IMPLANT
SUT ETHIBOND 2 0 SH (SUTURE) ×18
SUT ETHIBOND 2 0 SH 36X2 (SUTURE) IMPLANT
SUT ETHIBOND V-5 VALVE (SUTURE) IMPLANT
SUT PROLENE 3 0 SH 1 (SUTURE) ×3 IMPLANT
SUT PROLENE 3 0 SH DA (SUTURE) IMPLANT
SUT PROLENE 4 0 RB 1 (SUTURE) ×15
SUT PROLENE 4-0 RB1 .5 CRCL 36 (SUTURE) ×8 IMPLANT
SUT PROLENE 5 0 C 1 36 (SUTURE) ×6 IMPLANT
SUT PROLENE 5 0 RB 2 (SUTURE) ×6 IMPLANT
SUT PROLENE 6 0 C 1 30 (SUTURE) ×2 IMPLANT
SUT PROLENE 8 0 BV175 6 (SUTURE) ×2 IMPLANT
SUT SILK  1 MH (SUTURE) ×3
SUT SILK 1 MH (SUTURE) IMPLANT
SUT SILK 1 TIES 10X30 (SUTURE) ×1 IMPLANT
SUT SILK 2 0 SH CR/8 (SUTURE) ×2 IMPLANT
SUT SILK 2 0 TIES 10X30 (SUTURE) ×1 IMPLANT
SUT SILK 2 0 TIES 17X18 (SUTURE) ×3
SUT SILK 2-0 18XBRD TIE BLK (SUTURE) IMPLANT
SUT SILK 3 0 SH CR/8 (SUTURE) ×1 IMPLANT
SUT SILK 4 0 TIE 10X30 (SUTURE) ×2 IMPLANT
SUT STEEL 6MS V (SUTURE) ×1 IMPLANT
SUT STEEL STERNAL CCS#1 18IN (SUTURE) IMPLANT
SUT STEEL SZ 6 DBL 3X14 BALL (SUTURE) ×3 IMPLANT
SUT TEM PAC WIRE 2 0 SH (SUTURE) ×4 IMPLANT
SUT VIC AB 1 CTX 36 (SUTURE) ×6
SUT VIC AB 1 CTX36XBRD ANBCTR (SUTURE) ×4 IMPLANT
SUT VIC AB 2-0 CT1 27 (SUTURE)
SUT VIC AB 2-0 CT1 TAPERPNT 27 (SUTURE) IMPLANT
SUT VIC AB 3-0 X1 27 (SUTURE) IMPLANT
SUT VICRYL 2 0 J607H (SUTURE) ×2 IMPLANT
SUT VICRYL 3 0 (SUTURE) ×2 IMPLANT
SUTURE E-PAK OPEN HEART (SUTURE) ×3 IMPLANT
SYSTEM SAHARA CHEST DRAIN ATS (WOUND CARE) ×3 IMPLANT
TAPE CLOTH SURG 4X10 WHT LF (GAUZE/BANDAGES/DRESSINGS) ×1 IMPLANT
TOWEL OR 17X24 6PK STRL BLUE (TOWEL DISPOSABLE) ×3 IMPLANT
TOWEL OR 17X26 10 PK STRL BLUE (TOWEL DISPOSABLE) ×3 IMPLANT
TRAY FOLEY IC TEMP SENS 14FR (CATHETERS) ×3 IMPLANT
TUBE CONNECTING 12X1/4 (SUCTIONS) ×1 IMPLANT
UNDERPAD 30X30 INCONTINENT (UNDERPADS AND DIAPERS) ×3 IMPLANT
VALVE MAGNA EASE AORTIC 25MM (Prosthesis & Implant Heart) ×1 IMPLANT
WATER STERILE IRR 1000ML POUR (IV SOLUTION) ×6 IMPLANT
YANKAUER SUCT BULB TIP NO VENT (SUCTIONS) ×1 IMPLANT

## 2015-07-05 NOTE — Op Note (Signed)
CARDIOVASCULAR SURGERY OPERATIVE NOTE  07/05/2015  Surgeon:  [name], MD  First Assistant: Lars Pinks PA-C   Preoperative Diagnosis:  Bicuspid aortic valve and 5.2 cm ascending aortic aneurysm   Postoperative Diagnosis:  Same   Procedure:  1. Median Sternotomy 2. Extracorporeal circulation 3.   Resection and grafting of ascending aortic aneurysm using a 28 mm Hemashield graft under deep hypothermic circulatory arrest. 4.   Bentall procedure using a composite 25 mm Edwards Magna-Ease pericardial valve and 28 mm Gelweave valsalva graft  Anesthesia:  General Endotracheal   Clinical History/Surgical Indication:  The patient ois a 65 year old gentleman with a history of hypertension and renal cell carcinoma s/p right nephrectomy who is followed at [facility] by Dr. Celso Sickle. He was seen there for his routine follow up in October and a CXR reportedly showed a dilated ascending aorta. He underwent an echo on 06/02/2015 in [place] which showed a dilated ascending aorta at 52 mm diameter with the distal ascending aorta being 40 mm and the proximal arch 38 mm. The aortic root was 46 mm. The images of the aortic valve suggested a bicuspid valve with mildly thickened leaflets and mild AI. The LVEF was normal. He denies chest pain, shortness of breath, and dizziness. A CT of the chest without contrast shows a fusiform aortic root and ascending aortic aneurysm with a maximum diameter of 5.2 cm at the level of the right PA. The aortic root is 4.6 cm and the proximal arch at the level of the innominate artery is 3.8 cm. The is extensive calcification of the mitral annulus extending over to the aortic valve. His echo showed a bicuspid aortic valve with thickening and some calcification of the leaflets with restricted motion of one of the leaflets. Cardiac cath shows normal coronary arteries.  He has a bicuspid aortic valve that is moderately calcified and has restricted leaflet motion. I don't see  any measurements of the aortic valve pressure gradient on the echo and nothing about it in the report. There is mild AI. He has a fusiform ascending aortic aneurysm with a maximum diameter of 5.2 cm. The current guidelines recommend replacement of the ascending aortic aneurysm at 5.0 cm when there is a bicuspid aortic valve due to the nine times higher risk of aortic dissection compared to patients with a tri-leaflet aortic valve. Forty percent of these aortic dissections occur at diameters less than 5 cm. I have recommended replacement of his aortic valve and ascending aorta. I don't think he is a candidate for a valve-sparing procedure due to the thickening, restricted mobility and calcification of the aortic valve. I discussed the options of mechanical and tissue valves with him and the need for lifelong coumadin with a mechanical valve. He is certain that he does not want to be on coumadin and wants a tissue valve. I discussed the operative procedure with the patient including alternatives, benefits and risks; including but not limited to bleeding, blood transfusion, infection, stroke, myocardial infarction, graft failure, heart block requiring a permanent pacemaker, organ dysfunction, and death. Aaron Cole understands and agrees to proceed.    Preparation:  The patient was seen in the preoperative holding area and the correct patient, correct operation were confirmed with the patient after reviewing the medical record and catheterization. The consent was signed by me. Preoperative antibiotics were given. A pulmonary arterial line and radial arterial line were placed by the anesthesia team. The patient was taken back to the operating room and  positioned supine on the operating room table. After being placed under general endotracheal anesthesia by the anesthesia team a foley catheter was placed. The neck, chest, abdomen, and both legs were prepped with betadine soap and solution and draped in the  usual sterile manner. A surgical time-out was taken and the correct patient and operative procedure were confirmed with the nursing and anesthesia staff.   TEE: performed by Dr. Suella Broad. This showed a bicuspid aortic valve with no AS, trivial AI, trivial MR, trivial TR, preserved LV function.    Cardiopulmonary Bypass:  A median sternotomy was performed. The pericardium was opened in the midline. Right ventricular function appeared normal. The ascending aorta was of normal size and had no palpable plaque. There were no contraindications to aortic cannulation or cross-clamping. The patient was fully systemically heparinized and the ACT was maintained > 400 sec. The distal ascending aorta was cannulated with a 20 F aortic cannula for arterial inflow. Venous cannulation was performed via the right atrial appendage using a two-staged venous cannula. Hyperkalemic retrograde cold blood cardioplegia was used to induce diastolic arrest and was then given at about 20 minute intervals throughout the period of arrest to maintain myocardial temperature at or below 10 degrees centigrade. A temperature probe was inserted into the interventricular septum and an insulating pad was placed in the pericardium. CO2 was insufflated into the pericardium throughout the case to minimize intracardiac air.    Resection and grafting of ascending aortic aneurysm:  The patient was placed on cardiopulmonary bypass and a left ventricular vent was placed via the right superior pulmonary vein. Systemic cooling was begun with a goal temperature of 18 degrees centigrade by bladder and rectal temperature probes. A retrograde cardioplegia cannula was placed through the right atrium into the coronary sinus without difficulty. A retrograde cerebral perfusion cannula was placed into the SVC through a pursestring suture and the SVC was encircled with a silastic tape. After 30 minutes of cooling the target temperature of 18 degrees  centigrade was reached. Cerebral oximetry was 70% bilaterally. BIS was zero. The patient was given 150 mg of Etomidate and 125 mg of Solumedrol. The head was packed in ice. The bed was placed in steep trendelenburg. Circulatory arrest was begun and the blood volume emptied into the venous reservoir. Continuous retrograde cerebral perfusion was begun and the SVC occluded with the silastic tape. Cold blood retrograde cardioplegia was given and myocardial temperature dropped to 10 degrees centigrade. Additional doses were given at approximately 20 minute intervals throughout the period of circulatory arrest and cross-clamping. Complete diastolic arrest was maintained. The aortic cannula was removed. The aorta was transected just proximal to the innominate artery beveling the resection out along the undersurface of the aortic arch (Hemiarch replacement). The aortic diameter was measured at 28 mm here. A 28 x 10 mm Hemasheild Platinum vascular graft was prepared. ( Catalog # [id number], [place] [id number]). It was anastomosed to the aortic arch in an end to end manner using 3-0 prolene continuous suture with a felt strip to reinforce the anastomisis. A light coating of CoSeal was applied to seal needle holes. The arterial end of the bypass circuit was then connected to the 71mm side arm graft and circulation was slowly resumed. The tape was removed from the SVC. The aortic graft was cross-clamped proximal to the side arm graft and full CPB support was resumed. Circulatory arrest time was 58minutes. Retrograde cerebral perfusion time was 13 minutes.   Bentall Procedure:   The ascending  aorta was mobilized from the right pulmonary artery and main PA. It was opened longitudinally and the valve inspected. This was a bicuspid valve with fusion of the left and right cusps. This fused cusp was calcified and thickened and non-mobile. There was a bar of calcium extending from the mitral annulus to the aortic annulus at the base of  the left coronary cusp. The right and left coronary arteries were removed from the aortic root with a button of aortic wall around the ostia. They were retracted carefully out of the way with stay sutures to prevent rotation. The native valve was excised taking care to remove all particulate debri. The annulus was decalcified with rongeurs. The annulus was sized and a 25 mm Edwards Magna-Ease pericardial valve was chosen. ( Ref # [id number], Serial # [id number]). A 28 mm Gelweave valsalva graft was used ( Model [id number], SN [id number]). The proximal cuff was shortened to 3 rings. The valve was inserted into the graft so that the sewing cuff was adjacent to the proximal cuff and the valve was sutured to the graft at each commissural post using a 5-0 prolene suture to maintain alignment.  A series of pledgetted 2-0 Ethibond horizontal mattress sutures were placed around the annulus with the pledgets in a sub-annular position. The sutures were placed through a strip of autologous pericardium to reinforce the annulus and then the valve sewing ring and proximal graft cuff. The composite valve/graft was lowered into place and the sutures tied. The valve/graft seated nicely. Small openings were made in the graft for the coronary anastomoses using a thermal cautery. Then the left and right coronary buttons were anastomosed to the graft in an end to side manner using continuous 5-0 prolene suture. A light coating of CoSeal was applied to each anastomosis for hemostasis. The two grafts were then cut to the appropriate length and anastomosed end to end using continuous 3-0 prolene suture. CoSeal was applied to seal the needle holes in the grafts. A vent cannula was placed into the graft to remove any air. Deairing maneuvers were performed and the bed placed in trendelenburg position.   Completion:  The patient was rewarmed to 37 degrees Centigrade.  The crossclamp was removed with a time of 134 minutes. There was spontaneous  return of sinus rhythm. The distal and proximal anastomoses were checked for hemostasis. The position of the grafts was satisfactory. The vascular anastomoses all appeared hemostatic. Two temporary epicardial pacing wires were placed on the right atrium and two on the right ventricle. The patient was weaned from CPB without difficulty on dopamine 2 mcg. CPB time was 212 minutes. Cardiac output was 6 LPM. TEE showed a normal functioning aortic valve prosthesis, trivial MR, trivial TR, normal LV function.  Heparin was fully reversed with protamine and the vent and venous cannulas removed. The arterial side arm graft was ligated with a heavy silk tie and divided. The stump was suture ligated with a pledgetted 3-0 prolene suture. Hemostasis was achieved. Mediastinal and left pleural drainage tubes were placed. The sternum was closed with double #6 stainless steel wires. The fascia was closed with continuous # 1 vicryl suture. The subcutaneous tissue was closed with 2-0 vicryl continuous suture. The skin was closed with 3-0 vicryl subcuticular suture. All sponge, needle, and instrument counts were reported correct at the end of the case. Dry sterile dressings were placed over the incisions and around the chest tubes which were connected to pleurevac suction. The patient was then transported to the  surgical intensive care unit in critical but stable condition.

## 2015-07-05 NOTE — Addendum Note (Signed)
Addendum  created 07/05/15 1450 by Effie Berkshire, MD   Modules edited: Clinical Notes   Clinical Notes:  File: [id number]

## 2015-07-05 NOTE — Anesthesia Procedure Notes (Addendum)
Procedure Name: Intubation Date/Time: 07/05/2015 7:46 AM Performed by: Carney Living Pre-anesthesia Checklist: Patient identified, Emergency Drugs available, Suction available, Patient being monitored and Timeout performed Patient Re-evaluated:Patient Re-evaluated prior to inductionOxygen Delivery Method: Circle system utilized Preoxygenation: Pre-oxygenation with 100% oxygen Intubation Type: IV induction Ventilation: Mask ventilation without difficulty Laryngoscope Size: Mac and 4 Grade View: Grade II Tube type: Oral Tube size: 8.0 mm Number of attempts: 1 Airway Equipment and Method: Stylet Placement Confirmation: ETT inserted through vocal cords under direct vision,  positive ETCO2 and breath sounds checked- equal and bilateral Secured at: 22 cm Tube secured with: Tape Dental Injury: Teeth and Oropharynx as per pre-operative assessment    Central Venous Catheter Insertion Performed by: anesthesiologist 07/05/2015 7:00 AM Patient location: Pre-op. Preanesthetic checklist: patient identified, IV checked, site marked, risks and benefits discussed, surgical consent, monitors and equipment checked, pre-op evaluation, timeout performed and anesthesia consent Position: Trendelenburg Lidocaine 1% used for infiltration Landmarks identified and Seldinger technique used Catheter size: 9 Fr Central line was placed.Double lumen Procedure performed using ultrasound guided technique. Attempts: 1 Following insertion, line sutured and dressing applied. Post procedure assessment: blood return through all ports and free fluid flow. Patient tolerated the procedure well with no immediate complications.

## 2015-07-05 NOTE — Interval H&P Note (Signed)
History and Physical Interval Note:  07/05/2015 5:52 AM  Aaron Cole  has presented today for surgery, with the diagnosis of DILATED ASCENDING AORTA  The various methods of treatment have been discussed with the patient and family. After consideration of risks, benefits and other options for treatment, the patient has consented to  Procedure(s): BENTALL PROCEDURE (N/A) TRANSESOPHAGEAL ECHOCARDIOGRAM (TEE) (N/A) as a surgical intervention .  The patient's history has been reviewed, patient examined, no change in status, stable for surgery.  I have reviewed the patient's chart and labs.  Questions were answered to the patient's satisfaction.     [name]

## 2015-07-05 NOTE — OR Nursing (Signed)
SICU Notification: 2nd call @ 1245 noon ; 3rd call @ 1325 pm; and final call @1355pm        Claudina Lick, RN

## 2015-07-05 NOTE — Transfer of Care (Signed)
Immediate Anesthesia Transfer of Care Note  Patient: Aaron Cole  Procedure(s) Performed: Procedure(s): BENTALL PROCEDURE UTILIZING A 28 X 10 MM WOVEN DOUBLE VELOUR VASCULAR GRAFT AND A 28MM GELWEAVE WOVEN VASCULAR GRAFT. 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 (N/A) TRANSESOPHAGEAL ECHOCARDIOGRAM (TEE) (N/A)  Patient Location: SICU  Anesthesia Type:General  Level of Consciousness: sedated and Patient remains intubated per anesthesia plan  Airway & Oxygen Therapy: Patient remains intubated per anesthesia plan and Patient placed on Ventilator (see vital sign flow sheet for setting)  Post-op Assessment: Report given to RN and Post -op Vital signs reviewed and stable  Post vital signs: Reviewed and stable  Last Vitals:  Filed Vitals:   07/05/15 0551  BP: 174/108  Pulse: 77  Temp: 36.6 C  Resp: 18    Complications: No apparent anesthesia complications

## 2015-07-05 NOTE — Brief Op Note (Signed)
07/05/2015  12:21 PM  PATIENT:  Aaron Cole  65 y.o. male  PRE-OPERATIVE DIAGNOSIS:  1. THORACIC AORTIC ANEURYSM 2. MILD AI  POST-OPERATIVE DIAGNOSIS:  1. THORACIC AORTIC ANEURYSM 2. MILD AI   PROCEDURE:  TRANSESOPHAGEAL ECHOCARDIOGRAM (TEE), BENTALL PROCEDURE UTILIZING A 28 X 10 MM WOVEN DOUBLE VELOUR VASCULAR GRAFT AND A 28MM GELWEAVE WOVEN VASCULAR GRAFT and AORTIC VALVE REPLACEMENT (using a25MM AORTIC MAGNA EASE PERICARDIAL AORTIC VALVE)  SURGEON:  Surgeon(s) and Role:    * [name], MD - Primary  PHYSICIAN ASSISTANT: Lars Pinks PA-C  ANESTHESIA:   general  EBL:  Total I/O In: 15 [I.V.:1700] Out: 77 [Urine:1300]  DRAINS: Chest tubes placed in the mediastinal and pleural spaces   SPECIMEN:  Source of Specimen:  Native aortic valve leaflets  DISPOSITION OF SPECIMEN:  PATHOLOGY  COUNTS CORRECT:  YES  DICTATION: .Dragon Dictation  PLAN OF CARE: Admit to inpatient   PATIENT DISPOSITION:  ICU - intubated and hemodynamically stable.   Delay start of Pharmacological VTE agent (>24hrs) due to surgical blood loss or risk of bleeding: yes  BASELINE WEIGHT: 100 kg  Aortic Valve Etiology   Aortic Insufficiency:  Mild  Aortic Valve Disease:  No.  Aortic Stenosis:  No.  Etiology (Choose at least one and up to  5 etiologies):  Bicuspid valve disease and Degenerative - Calcified  Aortic Valve  Procedure Performed:  Replacement: Yes.  Bioprosthetic Valve. Implant Model Number:[id number], Size:25, Unique Device Identifier:[id number]  Repair/Reconstruction: No.   Aortic Annular Enlargement: Yes.

## 2015-07-05 NOTE — H&P (Signed)
Aaron Cole [address]       Aaron Cole,Aaron Cole [address]             [phone]      Cardiothoracic Surgery History and Physical   PCP is Aaron Bush, MD Referring Provider is Aaron Bush, MD  Chief Complaint  Patient presents with  . Thoracic Aortic Aneurysm    Surgical eval, ECHO 06/02/2015    HPI:  The patient ois a 65 year old gentleman with a history of hypertension and renal cell carcinoma s/p right nephrectomy who is followed at [facility] by Dr. Celso Cole. He was seen there for his routine follow up in October and a CXR reportedly showed a dilated ascending aorta. He underwent an echo on 06/02/2015 in [place] which showed a dilated ascending aorta at 52 mm diameter with the distal ascending aorta being 40 mm and the proximal arch 38 mm. The aortic root was 46 mm. The images of the aortic valve suggested a bicuspid valve with mildly thickened leaflets and mild AI. The LVEF was normal. He denies chest pain, shortness of breath, and dizziness. A CT of the chest without contrast shows a fusiform aortic root and ascending aortic aneurysm with a maximum diameter of 5.2 cm at the level of the right PA. The aortic root is 4.6 cm and the proximal arch at the level of the innominate artery is 3.8 cm. The is extensive calcification of the mitral annulus extending over to the aortic valve. His echo showed a bicuspid aortic valve with thickening and some calcification of the leaflets with restricted motion of one of the leaflets. Cardiac cath shows normal coronary arteries.   Past Medical History  Diagnosis Date  . Sinus headache     recurrent  . HTN (hypertension)   . Renal cell carcinoma ([place]) 2013    clear cell s/p R nephrectomy  . S/p nephrectomy 2013    [facility]  . CAD (coronary artery disease) 2013    per CT scan - brings CD of CT, no report - will need [facility] records of CT scan report  . Diverticulosis     by CT scan  . CKD  (chronic kidney disease) stage 3, GFR 30-59 ml/min 2015    iatrogenic (decreased nephron mass) established with Dr. Alfonse Cole [facility])    Past Surgical History  Procedure Laterality Date  . Nasal sinus surgery  1978    sinus surgery done at [facility]  . Rectal fistula repair  1980s  . Nephrectomy  12/2011    [facility], R for RCC  . Colonoscopy  2011    per pt normal    Family History  Problem Relation Age of Onset  . Cancer Paternal Grandmother     throat  . Heart disease Mother   . Heart disease Father   . Coronary artery disease Maternal Uncle     MI  . Cancer Paternal Aunt     breast   . Hypertension Mother   . Diabetes Father   . Stroke Neg Hx   . Cancer Paternal Aunt     brain cancer  . Colon polyps Father   . Colon polyps Brother   No history of aortic valve disease or aneurysm disease.  Social History Social History  Substance Use Topics  . Smoking status: Never Smoker   . Smokeless tobacco: Never Used  . Alcohol Use: Yes     Comment: Socially    Current Outpatient Prescriptions  Medication Sig  Dispense Refill  . aspirin 81 MG tablet Take 81 mg by mouth daily.    . fluticasone (FLONASE) 50 MCG/ACT nasal spray INHALE 2 SPRAYS INTO EACH NOSTRIL EVERY DAY. 16 g 11  . Multiple Vitamins-Minerals (MULTIVITAMIN ADULT PO) Take 1 tablet by mouth every other day.    . Omega-3 Fatty Acids (FISH OIL) 1000 MG CAPS Take 1 capsule by mouth daily.    . sildenafil (VIAGRA) 100 MG tablet Take 0.5-1 tablets (50-100 mg total) by mouth as needed. 10 tablet 3  . telmisartan (MICARDIS) 40 MG tablet TAKE 1 TABLET BY MOUTH TWICE DAILY 180 tablet 3   No current facility-administered medications for this visit.    No Known Allergies  Review of Systems  Constitutional: Negative.  HENT: Negative.  Eyes: Negative.  Respiratory: Negative for  shortness of breath.  Cardiovascular: Negative for chest pain, palpitations and leg swelling.  Gastrointestinal:   Frequent heartburn  Endocrine: Negative.  Genitourinary:   S/p right nephrectomy for cancer  Musculoskeletal: Negative.  Skin: Negative.  Allergic/Immunologic: Negative.  Neurological: Negative.  Hematological: Negative.  Psychiatric/Behavioral: Negative.    BP 138/97 mmHg  Pulse 90  Resp 20  Ht 5\' 10"  (1.778 m)  Wt 218 lb (98.884 kg)  BMI 31.28 kg/m2  SpO2 98% Physical Exam  Constitutional: He is oriented to person, place, and time. He appears well-developed and well-nourished.  HENT:  Mouth/Throat: Oropharynx is clear and moist.  Eyes: EOM are normal. Pupils are equal, round, and reactive to light.  Neck: Normal range of motion. Neck supple. No JVD present. No thyromegaly present.  Cardiovascular: Normal rate, regular rhythm and intact distal pulses.  Murmur heard. 1/6 systolic murmur along RUSB  Pulmonary/Chest: Effort normal and breath sounds normal. No respiratory distress.  Abdominal: Soft. Bowel sounds are normal. He exhibits no distension and no mass. There is no tenderness.  Musculoskeletal: Normal range of motion. He exhibits no edema.  Lymphadenopathy:   He has no cervical adenopathy.  Neurological: He is alert and oriented to person, place, and time. He has normal strength. No cranial nerve deficit or sensory deficit.  Skin: Skin is warm and dry.  Psychiatric: He has a normal mood and affect.     Diagnostic Tests:     *Aaron Cole            Aaron Cole, Aaron Cole [id number]              [phone]  ------------------------------------------------------------------- Transthoracic Echocardiography  Patient:  Aaron, Cole MR #:    [id number] Study Date: 06/02/2015 Gender:   M Age:    64 Height:   177.8 cm Weight:   97.1  kg BSA:    2.22 m^2 Pt. Status: Room:  ATTENDING  Default, Provider [phone] Aaron Cole [id number] Aaron Cole, Aaron Cole PERFORMING  Aaron Cole, [place] SONOGRAPHER Aaron Cole, RVT, RDCS, RDMS  cc:  ------------------------------------------------------------------- LV EF: 60% -  65%  ------------------------------------------------------------------- History:  PMH: Ascending aortic aneurysm suspected from CXR images. Murmur. Coronary artery disease. Risk factors: Hypertension. Dyslipidemia.  ------------------------------------------------------------------- Study Conclusions  - Left ventricle: The cavity size was normal. Systolic function was normal. The estimated ejection fraction was in the range of 60% to 65%. Wall motion was normal; there were no regional wall motion abnormalities. Doppler parameters are consistent with abnormal left ventricular relaxation (grade 1 diastolic dysfunction). - Aortic valve: Images suggestive of bicuspid aortic valve.  Leaflets are mildly thickened. There was mild regurgitation. - Aorta: Severely dilated ascending aorta, estimated at diameter: 52 mm (S). Distal ascending aorta estimated at 4 cm, proximal arch 3.8 cm. - Aortic root: The aortic root was moderately dilated at 4.6 cm. - Mitral valve: There was mild regurgitation.  Impressions:  - Consdier cardiology and cardiothoracic srgery referrral for evaluation and management of ascending aorta aneurysm.  Transthoracic echocardiography. M-mode, complete 2D, spectral Doppler, and color Doppler. Birthdate: Patient birthdate: 1950/07/21. Age: Patient is 65 yr old. Sex: Gender: male. BMI: 30.7 kg/m^2. Blood pressure:   126/102 Patient status: Outpatient. Study date: Study date: 06/02/2015. Study time:  02:42 PM.  -------------------------------------------------------------------  ------------------------------------------------------------------- Left ventricle: The cavity size was normal. Systolic function was normal. The estimated ejection fraction was in the range of 60% to 65%. Wall motion was normal; there were no regional wall motion abnormalities. Doppler parameters are consistent with abnormal left ventricular relaxation (grade 1 diastolic dysfunction).  ------------------------------------------------------------------- Aortic valve: Images suggestive of bicuspid aortic valve. Leaflets are mildly thickened. Mobility was not restricted. Doppler: Transvalvular velocity was within the normal range. There was no stenosis. There was mild regurgitation.  ------------------------------------------------------------------- Aorta: Aortic root: The aortic root was moderately dilated at 4.6 cm.  ------------------------------------------------------------------- Mitral valve:  Structurally normal valve.  Mobility was not restricted. Doppler: Transvalvular velocity was within the normal range. There was no evidence for stenosis. There was mild regurgitation.  ------------------------------------------------------------------- Left atrium: The atrium was normal in size.  ------------------------------------------------------------------- Right ventricle: The cavity size was normal. Wall thickness was normal. Systolic function was normal.  ------------------------------------------------------------------- Pulmonic valve:  Structurally normal valve.  Cusp separation was normal. Doppler: Transvalvular velocity was within the normal range. There was no evidence for stenosis. There was trivial regurgitation.  ------------------------------------------------------------------- Tricuspid valve:  Structurally normal valve.  Doppler: Transvalvular velocity was  within the normal range. There was mild regurgitation.  ------------------------------------------------------------------- Pulmonary artery:  The main pulmonary artery was normal-sized. Systolic pressure was within the normal range.  ------------------------------------------------------------------- Right atrium: The atrium was normal in size.  ------------------------------------------------------------------- Pericardium: There was no pericardial effusion.  ------------------------------------------------------------------- Systemic veins: Inferior vena cava: The vessel was normal in size.  ------------------------------------------------------------------- Measurements  Left ventricle               Value    Reference LV ID, ED, PLAX chordal           43.6 mm   43 - 52 LV ID, ES, PLAX chordal           29.7 mm   23 - 38 LV fx shortening, PLAX chordal       32  %   >=29 LV PW thickness, ED             11  mm   --------- IVS/LV PW ratio, ED             1.29     <=1.3 Stroke volume, 2D              67  ml   --------- Stroke volume/bsa, 2D            30  ml/m^2 --------- LV e&', lateral               7.8  cm/s  --------- LV E/e&', lateral              5.82     --------- LV e&', medial  5.85 cm/s  --------- LV E/e&', medial               7.76     --------- LV e&', average               6.83 cm/s  --------- LV E/e&', average              6.65     ---------  Ventricular septum             Value    Reference IVS thickness, ED              14.2 mm   ---------  LVOT                    Value    Reference LVOT ID, S                 24  mm    --------- LVOT area                  4.52 cm^2  --------- LVOT ID                   24  mm   --------- LVOT peak velocity, S            84.2 cm/s  --------- LVOT mean velocity, S            53.7 cm/s  --------- LVOT VTI, S                 14.9 cm   --------- LVOT peak gradient, S            3   mm Hg --------- Stroke volume (SV), LVOT DP         67.4 ml   --------- Stroke index (SV/bsa), LVOT DP       30.4 ml/m^2 ---------  Aorta                    Value    Reference Aortic root ID, ED             46  mm   --------- Ascending aorta ID, A-P, S         52  mm   ---------  Left atrium                 Value    Reference LA ID, A-P, ES               35  mm   --------- LA ID/bsa, A-P               1.58 cm/m^2 <=2.2 LA volume, ES, 1-p A4C           39  ml   --------- LA volume/bsa, ES, 1-p A4C         17.6 ml/m^2 ---------  Mitral valve                Value    Reference Mitral E-wave peak velocity         45.4 cm/s  --------- Mitral A-wave peak velocity         71.1 cm/s  --------- Mitral deceleration time      (H)   239  ms   150 - 230 Mitral E/A ratio, peak           0.6     ---------  Tricuspid valve  Value    Reference Tricuspid maximal inflow velocity,     240  cm/s  --------- PISA  Legend: (L) and (H) mark values outside specified reference range.  ------------------------------------------------------------------- Prepared and Electronically Authenticated by  Esmond Plants, MD, [facility] 2016-12-01T19:51:31         CLINICAL DATA: Known thoracic aortic aneurysm, patient is asymptomatic, history of  lung carcinoma, headache, prior nephrectomy  EXAM: CT CHEST WITHOUT CONTRAST  TECHNIQUE: Multidetector CT imaging of the chest was performed following the standard protocol without IV contrast.  COMPARISON: None.  FINDINGS: On soft tissue window images, the thyroid gland is unremarkable. On this unenhanced study, only small mediastinal lymph nodes are present. No mediastinal or hilar adenopathy is evident. The mid ascending thoracic aorta measures 5.2 cm in transverse diameter. Recommend semi-annual imaging followup by CTA or MRA and referral to cardiothoracic surgery if not already obtained. This recommendation follows 2010 ACCF/AHA/AATS/ACR/Cole/SCA/SCAI/SIR/STS/SVM Guidelines for the Diagnosis and Management of Patients With Thoracic Aortic Disease. Circulation. 2010; 121: e266-e36. Direct comparison with any prior outside CT of the to may be helpful to assess change. There is cardiomegaly present and there is considerable calcification of the mitral annulus. There does appear to be a small hiatal hernia present. Incidental imaging of the upper abdomen shows no significant abnormality. A right nephrectomy has been performed.  On lung window images, mild biapical parenchymal scarring is noted. On image 11 there is a 7 mm noncalcified nodular opacity near the left lung apex. A 6 mm noncalcified subpleural nodule is present in the left upper lobe near the apex on image 9. A 4 mm noncalcified nodular opacity is noted pleural-based in the anterior lateral left upper lobe on image 17. A 3 mm noncalcified nodule is present in the posterior left upper lobe on image 18. No additional pulmonary nodule is seen. These small noncalcified nodules most likely are postinflammatory or post infectious in etiology. However, followup CT of the chest is recommended in 4-6 months to assess stability. No parenchymal infiltrate is noted. There is no evidence of pleural effusion. The thoracic  vertebrae are in normal alignment with mild degenerative spurring in the lower thoracic spine.  IMPRESSION: 1. Several noncalcified lung nodules are noted as described above. Recommend followup CT of the chest in 4-6 months to assess stability. 2. The mid thoracic ascending thoracic aorta measures 5.2 cm in diameter. Recommend semi-annual imaging followup by CTA or MRA and referral to cardiothoracic surgery if not already obtained. This recommendation follows 2010 ACCF/AHA/AATS/ACR/Cole/SCA/SCAI/SIR/STS/SVM Guidelines for the Diagnosis and Management of Patients With Thoracic Aortic Disease. Circulation. 2010; 121: [id number]   Electronically Signed  By: Ivar Drape M.D.  On: 06/09/2015 15:48  [name], MD (Primary)      Procedures    Right Heart Cath and Coronary Angiography    Conclusion     Widely patent/normal coronary arteries. Left dominant coronary system.  Normal pulmonary artery pressures.  Unable cross the aortic valve due to aorto innominate artery tortuosity and loss of catheter torque control.    Indications    Bicuspid aortic valve [Q23.1 (ICD-10-CM)]   Ruptured aortic aneurysm, unspecified portion of aorta (HCC) [I71.8 (ICD-10-CM)]   Ascending aortic aneurysm (HCC) [I71.2 (ICD-10-CM)]    Technique and Indications    The right radial area was sterilely prepped and draped. Intravenous sedation with Versed and fentanyl was administered. 1% Xylocaine was infiltrated to achieve local analgesia. A double wall stick with an angiocath was utilized to obtain intra-arterial access. The modified Seldinger  technique was used to place a 30F " Slender" sheath in the right radial artery. Coronary angiography was done using 5 F catheters. Right coronary angiography was performed with a JR4. We were unable to selectively engage the right coronary due to aortic and innominate artery tortuosity. Left ventriculography was not done due absence of catheter torque  control and precath evaluation of the aortic valve with echocardiography. Left coronary angiography was performed with a JL 4.0 cm.   The right heart cath was performed via the right antecubital vein. An 18-gauge Angiocath started in the holding area was exchanged for a 5 French brachial sheath using a modified Seldinger technique. Right heart pressures were then recorded using a 5 French balloon tip catheter. A main pulmonary artery oximetry sample was obtained. The right heart catheter was then removed by pullback while observing pressures.  Hemostasis was achieved in the right antecubital vein by manual compression. A pneumatic cuff band was used for radial hemostasis. Estimated blood loss <50 mL. There were no immediate complications during the procedure.    Coronary Findings    Dominance: Left   Left Circumflex   . First Obtuse Marginal Branch   The vessel is small in size.   . Lateral Second Obtuse Marginal Branch   The vessel is small in size.   Marland Kitchen Fourth Obtuse Marginal Branch   The vessel is small in size.   Marland Kitchen Left Posterior Descending Artery   The vessel is moderate in size.     Right Coronary Artery   . Acute Marginal Branch   The vessel is small in size.      Coronary Diagrams    Diagnostic Diagram            Implants    Name ID Temporary Type Supply   No information to display    PACS Images    Show images for Cardiac catheterization     Link to Procedure Log    Procedure Log      Hemo Data       Most Recent Value   Fick Cardiac Output  6.46 L/min   Fick Cardiac Output Index  2.91 (L/min)/BSA   RA A Wave  8 mmHg   RA V Wave  7 mmHg   RA Mean  6 mmHg   RV Systolic Pressure  24 mmHg   RV Diastolic Pressure  4 mmHg   RV EDP  9 mmHg   PA Systolic Pressure  21 mmHg   PA Diastolic Pressure  11 mmHg   PA Mean  15 mmHg   PW A Wave  11 mmHg   PW V Wave  8 mmHg   PW Mean  7 mmHg   AO Systolic Pressure  0000000 mmHg   AO  Diastolic Pressure  78 mmHg   AO Mean  101 mmHg   QP/QS  1   TPVR Index  5.15 HRUI   TSVR Index  34.7 HRUI   PVR SVR Ratio  0.08   TPVR/TSVR Ratio  0.15       Impression:  I have personally reviewed his echo, cath and CT scan of the chest. He has a bicuspid aortic valve that is moderately calcified and has restricted leaflet motion. I don't see any measurements of the aortic valve pressure gradient on the echo and nothing about it in the report. There is mild AI. He has a fusiform ascending aortic aneurysm with a maximum diameter of 5.2 cm. The current guidelines recommend replacement  of the ascending aortic aneurysm at 5.0 cm when there is a bicuspid aortic valve due to the nine times higher risk of aortic dissection compared to patients with a tri-leaflet aortic valve. Forty percent of these aortic dissections occur at diameters less than 5 cm. I have recommended replacement of his aortic valve and ascending aorta. I don't think he is a candidate for a valve-sparing procedure due to the thickening, restricted mobility and calcification of the aortic valve. I discussed the options of mechanical and tissue valves with him and the need for lifelong coumadin with a mechanical valve. He is certain that he does not want to be on coumadin and wants a tissue valve. I discussed the operative procedure with the patient including alternatives, benefits and risks; including but not limited to bleeding, blood transfusion, infection, stroke, myocardial infarction, graft failure, heart block requiring a permanent pacemaker, organ dysfunction, and death.  Aaron Cole understands and agrees to proceed.    Plan:  Bentall procedure using a composite pericardial valve/Dacron graft under deep hypothermic circulatory arrest.

## 2015-07-05 NOTE — Progress Notes (Signed)
Dr Cyndia Bent called unit to check on pt. Updated on vital signs, CT output, Urine output, CI/CO. And current labs resulted Hgb 9 and platelet count 157. New orders received for PRN Albumin and okay to proceed with extubation if CT output continues to decrease and all other parameters met. Will implement and continue to monitor.   Rolla Plate

## 2015-07-05 NOTE — Anesthesia Postprocedure Evaluation (Addendum)
Anesthesia Post Note  Patient: Aaron Cole  Procedure(s) Performed: Procedure(s) (LRB): BENTALL PROCEDURE UTILIZING A 28 X 10 MM WOVEN DOUBLE VELOUR VASCULAR GRAFT AND A 28MM GELWEAVE WOVEN VASCULAR GRAFT. 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 (N/A) TRANSESOPHAGEAL ECHOCARDIOGRAM (TEE) (N/A)  Patient location during evaluation: SICU Anesthesia Type: General Level of consciousness: patient remains intubated per anesthesia plan Pain control: Unable to assess.  Vital Signs Assessment: post-procedure vital signs reviewed and stable Respiratory status: patient remains intubated per anesthesia plan Cardiovascular status: stable Anesthetic complications: no    Last Vitals:  Filed Vitals:   07/05/15 0551  BP: 174/108  Pulse: 77  Temp: 36.6 C  Resp: 18    Last Pain: There were no vitals filed for this visit.               Effie Berkshire

## 2015-07-05 NOTE — Progress Notes (Signed)
Rapid Wean protocol started.

## 2015-07-05 NOTE — Progress Notes (Signed)
  Echocardiogram Echocardiogram Transesophageal has been performed.  Aaron Cole 07/05/2015, 8:39 AM

## 2015-07-05 NOTE — Progress Notes (Signed)
Patient ID: Aaron Cole, male   DOB: 1950-07-20, 65 y.o.   MRN: [id number] EVENING ROUNDS NOTE :     [address]       [place] [address]             [phone]                 Day of Surgery Procedure(s) (LRB): BENTALL PROCEDURE UTILIZING A 28 X 10 MM WOVEN DOUBLE VELOUR VASCULAR GRAFT AND A 28MM GELWEAVE WOVEN VASCULAR GRAFT. 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 (N/A) TRANSESOPHAGEAL ECHOCARDIOGRAM (TEE) (N/A)  Total Length of Stay:  LOS: 0 days  BP 96/72 mmHg  Pulse 73  Temp(Src) 96.3 F (35.7 C) (Core (Comment))  Resp 12  Wt 221 lb (100.245 kg)  SpO2 100%  .Intake/Output      01/02 0701 - 01/03 0700 01/03 0701 - 01/04 0700   I.V. (mL/kg)  4140 (41.3)   Blood  865   IV Piggyback  1200   Total Intake(mL/kg)  6205 (61.9)   Urine (mL/kg/hr)  2830 (2.5)   Emesis/NG output  0 (0)   Blood  1750 (1.6)   Chest Tube  570 (0.5)   Total Output   5150   Net   +1055          . sodium chloride 20 mL/hr at 07/05/15 1700  . [START ON 07/06/2015] sodium chloride    . sodium chloride 20 mL/hr at 07/05/15 1700  . dexmedetomidine Stopped (07/05/15 1700)  . DOPamine 0.2 mcg/kg/min (07/05/15 1450)  . insulin (NOVOLIN-R) infusion 2 Units/hr (07/05/15 1400)  . lactated ringers    . lactated ringers 20 mL/hr at 07/05/15 1700  . nitroGLYCERIN Stopped (07/05/15 1400)  . phenylephrine (NEO-SYNEPHRINE) Adult infusion 40 mcg/min (07/05/15 1720)     Lab Results  Component Value Date   WBC 14.8* 07/05/2015   HGB 11.9* 07/05/2015   HCT 35.0* 07/05/2015   PLT 101* 07/05/2015   GLUCOSE 110* 07/05/2015   CHOL 185 12/15/2014   TRIG 157.0* 12/15/2014   HDL 33.30* 12/15/2014   LDLDIRECT 118 06/21/2011   LDLCALC 120* 12/15/2014   ALT 32 06/29/2015   AST 37 06/29/2015   NA 140 07/05/2015   K 3.8 07/05/2015   CL 104 07/05/2015   CREATININE 0.90 07/05/2015   BUN 17 07/05/2015   CO2 20* 06/29/2015   TSH 1.90 07/25/2012   PSA  1.42 12/15/2014   INR 1.60* 07/05/2015   HGBA1C 5.6 06/29/2015   Still sedated on vent  700 ml from chest tubes since surgery over 4 hours, 210,150 to 130 has been given plts  ci [id number] , PA diastolic  [id number]  Monitor CI and bleeding , recheck cbc, coag   Grace Isaac MD  Beeper [phone] Office [phone] 07/05/2015 6:09 PM

## 2015-07-05 NOTE — OR Nursing (Signed)
SICU notified of 1 hr call prior to patient arrival

## 2015-07-05 NOTE — Progress Notes (Signed)
Wean terminated. Pt still sleepy. Multiple apneic episodes

## 2015-07-06 ENCOUNTER — Encounter (HOSPITAL_COMMUNITY): Payer: Self-pay | Admitting: Surgery

## 2015-07-06 ENCOUNTER — Inpatient Hospital Stay (HOSPITAL_COMMUNITY): Payer: BLUE CROSS/BLUE SHIELD

## 2015-07-06 LAB — BASIC METABOLIC PANEL
Anion gap: 8 (ref 5–15)
BUN: 16 mg/dL (ref 6–20)
CO2: 20 mmol/L — AB (ref 22–32)
Calcium: 8 mg/dL — ABNORMAL LOW (ref 8.9–10.3)
Chloride: 111 mmol/L (ref 101–111)
Creatinine, Ser: 1.25 mg/dL — ABNORMAL HIGH (ref 0.61–1.24)
GFR calc Af Amer: >60 mL/min (ref >60)
GLUCOSE: 143 mg/dL — AB (ref 65–99)
POTASSIUM: 4.1 mmol/L (ref 3.5–5.1)
Sodium: 139 mmol/L (ref 135–145)

## 2015-07-06 LAB — CREATININE, SERUM
CREATININE: 1.43 mg/dL — AB (ref 0.61–1.24)
GFR, EST AFRICAN AMERICAN: 59 mL/min — AB (ref >60)
GFR, EST NON AFRICAN AMERICAN: 51 mL/min — AB (ref >60)

## 2015-07-06 LAB — CBC
HEMATOCRIT: 21.4 % — AB (ref 39.0–52.0)
HEMATOCRIT: 23.2 % — AB (ref 39.0–52.0)
HEMOGLOBIN: 8 g/dL — AB (ref 13.0–17.0)
Hemoglobin: 7.5 g/dL — ABNORMAL LOW (ref 13.0–17.0)
MCH: 29.3 pg (ref 26.0–34.0)
MCH: 29.5 pg (ref 26.0–34.0)
MCHC: 35 g/dL (ref 30.0–36.0)
MCHC: 34.5 g/dL (ref 30.0–36.0)
MCV: 83.6 fL (ref 78.0–100.0)
MCV: 85.6 fL (ref 78.0–100.0)
PLATELETS: 130 10*3/uL — AB (ref 150–400)
Platelets: 121 10*3/uL — ABNORMAL LOW (ref 150–400)
RBC: 2.56 MIL/uL — AB (ref 4.22–5.81)
RBC: 2.71 MIL/uL — ABNORMAL LOW (ref 4.22–5.81)
RDW: 13.6 % (ref 11.5–15.5)
RDW: 14.9 % (ref 11.5–15.5)
WBC: 12.8 10*3/uL — ABNORMAL HIGH (ref 4.0–10.5)
WBC: 20.6 10*3/uL — AB (ref 4.0–10.5)

## 2015-07-06 LAB — MAGNESIUM
MAGNESIUM: 2.6 mg/dL — AB (ref 1.7–2.4)
Magnesium: 2.5 mg/dL — ABNORMAL HIGH (ref 1.7–2.4)

## 2015-07-06 LAB — POCT I-STAT, CHEM 8
BUN: 22 mg/dL — ABNORMAL HIGH (ref 6–20)
CALCIUM ION: 1.15 mmol/L (ref 1.13–1.30)
CREATININE: 1.2 mg/dL (ref 0.61–1.24)
Chloride: 108 mmol/L (ref 101–111)
Glucose, Bld: 175 mg/dL — ABNORMAL HIGH (ref 65–99)
HEMATOCRIT: 25 % — AB (ref 39.0–52.0)
HEMOGLOBIN: 8.5 g/dL — AB (ref 13.0–17.0)
Potassium: 4.2 mmol/L (ref 3.5–5.1)
SODIUM: 138 mmol/L (ref 135–145)
TCO2: 17 mmol/L (ref 0–100)

## 2015-07-06 LAB — PREPARE PLATELET PHERESIS: UNIT DIVISION: 0

## 2015-07-06 LAB — POCT I-STAT 3, ART BLOOD GAS (G3+)
ACID-BASE DEFICIT: 5 mmol/L — AB (ref 0.0–2.0)
ACID-BASE DEFICIT: 5 mmol/L — AB (ref 0.0–2.0)
Bicarbonate: 17.8 mEq/L — ABNORMAL LOW (ref 20.0–24.0)
Bicarbonate: 19.3 mEq/L — ABNORMAL LOW (ref 20.0–24.0)
O2 Saturation: 98 %
O2 Saturation: 94 %
PH ART: 7.497 — AB (ref 7.350–7.450)
PH ART: 7.374 (ref 7.350–7.450)
PO2 ART: 96 mmHg (ref 80.0–100.0)
TCO2: 18 mmol/L (ref 0–100)
TCO2: 20 mmol/L (ref 0–100)
pCO2 arterial: 23 mmHg — ABNORMAL LOW (ref 35.0–45.0)
pCO2 arterial: 33 mmHg — ABNORMAL LOW (ref 35.0–45.0)
pO2, Arterial: 72 mmHg — ABNORMAL LOW (ref 80.0–100.0)

## 2015-07-06 LAB — GLUCOSE, CAPILLARY: GLUCOSE-CAPILLARY: 136 mg/dL — AB (ref 65–99)

## 2015-07-06 LAB — PREPARE RBC (CROSSMATCH)

## 2015-07-06 MED ORDER — SODIUM CHLORIDE 0.9 % IV SOLN
Freq: Once | INTRAVENOUS | Status: AC
  Administered 2015-07-06: 06:00:00 via INTRAVENOUS

## 2015-07-06 MED ORDER — METOCLOPRAMIDE HCL 5 MG/ML IJ SOLN
10.0000 mg | Freq: Four times a day (QID) | INTRAMUSCULAR | Status: DC | PRN
  Administered 2015-07-06: 10 mg via INTRAVENOUS
  Filled 2015-07-06: qty 2

## 2015-07-06 MED ORDER — CETYLPYRIDINIUM CHLORIDE 0.05 % MT LIQD
7.0000 mL | Freq: Two times a day (BID) | OROMUCOSAL | Status: DC
  Administered 2015-07-06 – 2015-07-14 (×11): 7 mL via OROMUCOSAL

## 2015-07-06 MED FILL — Sodium Bicarbonate IV Soln 8.4%: INTRAVENOUS | Qty: 50 | Status: AC

## 2015-07-06 MED FILL — Sodium Chloride IV Soln 0.9%: INTRAVENOUS | Qty: 3000 | Status: AC

## 2015-07-06 MED FILL — Electrolyte-R (PH 7.4) Solution: INTRAVENOUS | Qty: 7000 | Status: AC

## 2015-07-06 MED FILL — Lidocaine HCl IV Inj 20 MG/ML: INTRAVENOUS | Qty: 5 | Status: AC

## 2015-07-06 MED FILL — Mannitol IV Soln 20%: INTRAVENOUS | Qty: 500 | Status: AC

## 2015-07-06 NOTE — Progress Notes (Signed)
Utilization review completed.  

## 2015-07-06 NOTE — Progress Notes (Signed)
      [address]       [place] [address]             [phone]      No complaints  BP 104/64 mmHg  Pulse 98  Temp(Src) 98.7 F (37.1 C) (Oral)  Resp 15  Wt 227 lb 4.7 oz (103.1 kg)  SpO2 94%   Intake/Output Summary (Last 24 hours) at 07/06/15 1758 Last data filed at 07/06/15 1700  Gross per 24 hour  Intake 2545.33 ml  Output   2275 ml  Net 270.33 ml   K= 4.2, creatinine = 1.2 Hgb= 8.5  Doing well early postop  Remo Lipps C. Roxan Hockey, MD [facility] [phone]

## 2015-07-06 NOTE — Progress Notes (Signed)
Rapid Wean started

## 2015-07-06 NOTE — Procedures (Signed)
Extubation Procedure Note  Patient Details:   Name: Aaron Cole DOB: 10-03-1950 MRN: [id number]   Airway Documentation:  Pre Extubation: Pt completed Rapid Wean without complication. ABG in normal range. Pt alert and following commands. NIF -30, VC 840. Cuff leak audible.  Post Extubation: Pt on 2L [place]. Coughs/clears secretions. BBS clear. Pt able to speak name/location. No Stridor.     Evaluation  O2 sats: stable throughout Complications: No apparent complications Patient did tolerate procedure well. Bilateral Breath Sounds: Clear   Yes  Sharen Hint 07/06/2015, 2:18 AM

## 2015-07-06 NOTE — Progress Notes (Signed)
1 Day Post-Op Procedure(s) (LRB): BENTALL PROCEDURE UTILIZING A 28 X 10 MM WOVEN DOUBLE VELOUR VASCULAR GRAFT AND A 28MM GELWEAVE WOVEN VASCULAR GRAFT. 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 (N/A) TRANSESOPHAGEAL ECHOCARDIOGRAM (TEE) (N/A) Subjective:  Sore but otherwise ok  Objective: Vital signs in last 24 hours: Temp:  [96.1 F (35.6 C)-98.6 F (37 C)] 97.9 F (36.6 C) (01/04 0715) Pulse Rate:  [64-92] 89 (01/04 0715) Cardiac Rhythm:  [-] Atrial paced (01/04 0700) Resp:  [11-21] 18 (01/04 0715) BP: (81-142)/(57-94) 95/67 mmHg (01/04 0715) SpO2:  [94 %-100 %] 97 % (01/04 0715) Arterial Line BP: (80-166)/(51-83) 126/63 mmHg (01/04 0715) FiO2 (%):  [40 %-50 %] 40 % (01/04 0200) Weight:  [103.1 kg (227 lb 4.7 oz)] 103.1 kg (227 lb 4.7 oz) (01/04 0400)  Hemodynamic parameters for last 24 hours: PAP: (20-38)/(6-18) 27/14 mmHg CO:  [2.5 L/min-6.8 L/min] 6.8 L/min CI:  [1.1 L/min/m2-3.1 L/min/m2] 3.1 L/min/m2  Intake/Output from previous day: 01/03 0701 - 01/04 0700 In: 8090.8 [I.V.:5415.8; Blood:895; NG/GT:30; IV Piggyback:1750] Out: [id number] [Urine:3835; Emesis/NG output:50; Blood:1750; Chest Tube:1280] Intake/Output this shift:    General appearance: alert and cooperative Neurologic: intact Heart: regular rate and rhythm, S1, S2 normal, no murmur, click, rub or gallop Lungs: clear to auscultation bilaterally Extremities: edema mild Wound: dressing dry  Lab Results:  Recent Labs  07/05/15 1955 07/05/15 2000 07/06/15 0320  WBC 13.7*  --  12.8*  HGB 9.0* 8.5* 7.5*  HCT 26.2* 25.0* 21.4*  PLT 157  --  130*   BMET:  Recent Labs  07/05/15 2000 07/06/15 0320  NA 140 139  K 3.8 4.1  CL 111 111  CO2  --  20*  GLUCOSE 108* 143*  BUN 14 16  CREATININE 0.90 1.25*  CALCIUM  --  8.0*    PT/INR:  Recent Labs  07/05/15 1955  LABPROT 18.3*  INR 1.51*   ABG    Component Value Date/Time   PHART 7.374 07/06/2015 0328   HCO3  19.3* 07/06/2015 0328   TCO2 20 07/06/2015 0328   ACIDBASEDEF 5.0* 07/06/2015 0328   O2SAT 94.0 07/06/2015 0328   CBG (last 3)   Recent Labs  07/05/15 1957 07/06/15 07/06/15 0319  GLUCAP 97 113* 136*   CXR: clear  ECG: sinus 81, incomplete RBBB, prolonged QTc 508  Assessment/Plan: S/P Procedure(s) (LRB): BENTALL PROCEDURE UTILIZING A 28 X 10 MM WOVEN DOUBLE VELOUR VASCULAR GRAFT AND A 28MM GELWEAVE WOVEN VASCULAR GRAFT. 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 (N/A) TRANSESOPHAGEAL ECHOCARDIOGRAM (TEE) (N/A)  He is hemodynamically stable in sinus rhythm. Still on neo at 20 mcg and low dose dopamine for his kidney at 2 mcg. Wean neo as tolerated today. Continue dopamine. Hold beta blocker for now until BP stable off pressors. He was hypertensive preop.  Chest tube output significant postop but thin and non-clotting. Will keep chest tubes in today but can get out of bed. Hold aspirin today.  Volume excess: diurese once stable off neo.  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 Creat stable. Avoid nephrotoxic agents and follow.  IS, OOB.          LOS: 1 day    [name] 07/06/2015

## 2015-07-07 ENCOUNTER — Inpatient Hospital Stay (HOSPITAL_COMMUNITY): Payer: BLUE CROSS/BLUE SHIELD

## 2015-07-07 LAB — BASIC METABOLIC PANEL
Anion gap: 6 (ref 5–15)
BUN: 26 mg/dL — AB (ref 6–20)
CHLORIDE: 108 mmol/L (ref 101–111)
CO2: 23 mmol/L (ref 22–32)
Calcium: 8.2 mg/dL — ABNORMAL LOW (ref 8.9–10.3)
Creatinine, Ser: 1.25 mg/dL — ABNORMAL HIGH (ref 0.61–1.24)
GFR calc Af Amer: >60 mL/min (ref >60)
GLUCOSE: 122 mg/dL — AB (ref 65–99)
POTASSIUM: 4.1 mmol/L (ref 3.5–5.1)
Sodium: 137 mmol/L (ref 135–145)

## 2015-07-07 LAB — CBC
HEMATOCRIT: 21.3 % — AB (ref 39.0–52.0)
Hemoglobin: 7.2 g/dL — ABNORMAL LOW (ref 13.0–17.0)
MCH: 29.1 pg (ref 26.0–34.0)
MCHC: 33.8 g/dL (ref 30.0–36.0)
MCV: 86.2 fL (ref 78.0–100.0)
Platelets: 96 10*3/uL — ABNORMAL LOW (ref 150–400)
RBC: 2.47 MIL/uL — ABNORMAL LOW (ref 4.22–5.81)
RDW: 15.1 % (ref 11.5–15.5)
WBC: 19.2 10*3/uL — ABNORMAL HIGH (ref 4.0–10.5)

## 2015-07-07 LAB — PREPARE RBC (CROSSMATCH)

## 2015-07-07 MED ORDER — SODIUM CHLORIDE 0.9 % IV SOLN
Freq: Once | INTRAVENOUS | Status: AC
  Administered 2015-07-07: 10 mL via INTRAVENOUS

## 2015-07-07 MED ORDER — FUROSEMIDE 10 MG/ML IJ SOLN
40.0000 mg | Freq: Once | INTRAMUSCULAR | Status: AC
  Administered 2015-07-07: 40 mg via INTRAVENOUS
  Filled 2015-07-07: qty 4

## 2015-07-07 MED ORDER — FERROUS GLUCONATE 324 (38 FE) MG PO TABS
324.0000 mg | ORAL_TABLET | Freq: Two times a day (BID) | ORAL | Status: DC
  Administered 2015-07-07 – 2015-07-15 (×16): 324 mg via ORAL
  Filled 2015-07-07 (×20): qty 1

## 2015-07-07 NOTE — Progress Notes (Signed)
Patient ID: Aaron Cole, male   DOB: 1951-03-01, 65 y.o.   MRN: [id number]   SICU Evening Rounds:  Hemodynamically stable off dopamine.  Urine output good  Chest tubes out and ambulated around the ICU.

## 2015-07-07 NOTE — Progress Notes (Signed)
2 Days Post-Op Procedure(s) (LRB): BENTALL PROCEDURE UTILIZING A 28 X 10 MM WOVEN DOUBLE VELOUR VASCULAR GRAFT AND A 28MM GELWEAVE WOVEN VASCULAR GRAFT. 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 (N/A) TRANSESOPHAGEAL ECHOCARDIOGRAM (TEE) (N/A) Subjective:  Complains of chest wall pain from sternotomy. Some nausea that may be related to morphine.  Objective: Vital signs in last 24 hours: Temp:  [97.7 F (36.5 C)-98.7 F (37.1 C)] 98.4 F (36.9 C) (01/05 0747) Pulse Rate:  [81-119] 91 (01/05 0700) Cardiac Rhythm:  [-] Normal sinus rhythm;Sinus tachycardia (01/05 0700) Resp:  [12-22] 18 (01/05 0700) BP: (82-127)/(52-81) 103/62 mmHg (01/05 0700) SpO2:  [94 %-100 %] 97 % (01/05 0700) Arterial Line BP: (103-143)/(53-73) 116/53 mmHg (01/04 1400) Weight:  [102.3 kg (225 lb 8.5 oz)] 102.3 kg (225 lb 8.5 oz) (01/05 0500)  Hemodynamic parameters for last 24 hours: PAP: (25-29)/(12-15) 26/15 mmHg CO:  [5.7 L/min] 5.7 L/min CI:  [5.6 L/min/m2] 5.6 L/min/m2  Intake/Output from previous day: 01/04 0701 - 01/05 0700 In: 1220.1 [P.O.:120; I.V.:720.1; Blood:280; IV Piggyback:100] Out: 1250 [Urine:800; Chest Tube:450] Intake/Output this shift:    General appearance: alert, cooperative and up in chair Neurologic: intact Heart: regular rate and rhythm, S1, S2 normal, no murmur, click, rub or gallop Lungs: clear to auscultation bilaterally Abdomen: soft, non-tender; bowel sounds normal; no masses,  no organomegaly Extremities: edema mild Wound: dressing dry  Lab Results:  Recent Labs  07/06/15 1700 07/06/15 1703 07/07/15 0345  WBC 20.6*  --  19.2*  HGB 8.0* 8.5* 7.2*  HCT 23.2* 25.0* 21.3*  PLT 121*  --  96*   BMET:  Recent Labs  07/06/15 0320  07/06/15 1703 07/07/15 0345  NA 139  --  138 137  K 4.1  --  4.2 4.1  CL 111  --  108 108  CO2 20*  --   --  23  GLUCOSE 143*  --  175* 122*  BUN 16  --  22* 26*  CREATININE 1.25*  < > 1.20 1.25*   CALCIUM 8.0*  --   --  8.2*  < > = values in this interval not displayed.  PT/INR:  Recent Labs  07/05/15 1955  LABPROT 18.3*  INR 1.51*   ABG    Component Value Date/Time   PHART 7.374 07/06/2015 0328   HCO3 19.3* 07/06/2015 0328   TCO2 17 07/06/2015 1703   ACIDBASEDEF 5.0* 07/06/2015 0328   O2SAT 94.0 07/06/2015 0328   CBG (last 3)   Recent Labs  07/05/15 1957 07/06/15 07/06/15 0319  GLUCAP 97 113* 136*    Assessment/Plan: S/P Procedure(s) (LRB): BENTALL PROCEDURE UTILIZING A 28 X 10 MM WOVEN DOUBLE VELOUR VASCULAR GRAFT AND A 28MM GELWEAVE WOVEN VASCULAR GRAFT. 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 (N/A) TRANSESOPHAGEAL ECHOCARDIOGRAM (TEE) (N/A)  He is hemodynamically stable in sinus rhythm on dop 2 for renal perfusion. His BP is low normal and he is usually hypertensive. I suspect this is probably related to anemia, pain meds, Hold off on beta blocker for now.   Postop acute blood loss anemia: His Hgb did not rise at all after 1 unit prbc's yesterday. Since he is 7.2 will give another unit and start Fe2+.  Chest tube output has decreased significantly but still has some serosanguinous drainage. Will probably remove later today.  Hold aspirin since he was coagulopathic from surgery and has some postop thrombocytopenia.  Volume excess: will diurese some after transfusion this am.  Chronic kidney disease and single kidney. Creat is stable at baseline.  Continue mobilization and IS.   LOS: 2 days    [name] 07/07/2015

## 2015-07-08 ENCOUNTER — Inpatient Hospital Stay (HOSPITAL_COMMUNITY): Payer: BLUE CROSS/BLUE SHIELD

## 2015-07-08 LAB — CBC
HCT: 22.7 % — ABNORMAL LOW (ref 39.0–52.0)
Hemoglobin: 7.6 g/dL — ABNORMAL LOW (ref 13.0–17.0)
MCH: 29.3 pg (ref 26.0–34.0)
MCHC: 33.5 g/dL (ref 30.0–36.0)
MCV: 87.6 fL (ref 78.0–100.0)
PLATELETS: 78 10*3/uL — AB (ref 150–400)
RBC: 2.59 MIL/uL — ABNORMAL LOW (ref 4.22–5.81)
RDW: 14.8 % (ref 11.5–15.5)
WBC: 13.8 10*3/uL — ABNORMAL HIGH (ref 4.0–10.5)

## 2015-07-08 LAB — BASIC METABOLIC PANEL
Anion gap: 5 (ref 5–15)
BUN: 27 mg/dL — ABNORMAL HIGH (ref 6–20)
CALCIUM: 8.1 mg/dL — AB (ref 8.9–10.3)
CHLORIDE: 106 mmol/L (ref 101–111)
CO2: 25 mmol/L (ref 22–32)
CREATININE: 1.33 mg/dL — AB (ref 0.61–1.24)
GFR, EST NON AFRICAN AMERICAN: 56 mL/min — AB (ref >60)
Glucose, Bld: 118 mg/dL — ABNORMAL HIGH (ref 65–99)
Potassium: 4.3 mmol/L (ref 3.5–5.1)
SODIUM: 136 mmol/L (ref 135–145)

## 2015-07-08 LAB — TYPE AND SCREEN
ABO/RH(D): A NEG
ANTIBODY SCREEN: NEGATIVE
Unit division: 0
Unit division: 0

## 2015-07-08 MED ORDER — AMIODARONE HCL IN DEXTROSE 360-4.14 MG/200ML-% IV SOLN
60.0000 mg/h | INTRAVENOUS | Status: AC
  Administered 2015-07-08 (×2): 60 mg/h via INTRAVENOUS

## 2015-07-08 MED ORDER — FUROSEMIDE 10 MG/ML IJ SOLN
40.0000 mg | Freq: Once | INTRAMUSCULAR | Status: AC
  Administered 2015-07-08: 40 mg via INTRAVENOUS
  Filled 2015-07-08: qty 4

## 2015-07-08 MED ORDER — AMIODARONE LOAD VIA INFUSION
150.0000 mg | Freq: Once | INTRAVENOUS | Status: AC
  Administered 2015-07-08: 150 mg via INTRAVENOUS
  Filled 2015-07-08: qty 83.34

## 2015-07-08 MED ORDER — AMIODARONE HCL IN DEXTROSE 360-4.14 MG/200ML-% IV SOLN
30.0000 mg/h | INTRAVENOUS | Status: AC
  Administered 2015-07-08: 30 mg/h via INTRAVENOUS
  Filled 2015-07-08 (×2): qty 200

## 2015-07-08 MED ORDER — AMIODARONE HCL IN DEXTROSE 360-4.14 MG/200ML-% IV SOLN
INTRAVENOUS | Status: AC
  Filled 2015-07-08: qty 200

## 2015-07-08 NOTE — Progress Notes (Signed)
[facility] BRIEF SICU PROGRESS NOTE  3 Days Post-Op  S/P Procedure(s) (LRB): BENTALL PROCEDURE UTILIZING A 28 X 10 MM WOVEN DOUBLE VELOUR VASCULAR GRAFT AND A 28MM GELWEAVE WOVEN VASCULAR GRAFT. 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 (N/A) TRANSESOPHAGEAL ECHOCARDIOGRAM (TEE) (N/A)   Stable day Back in NSR on IV amiodarone BP stable O2 sats 91-93% on RA UOP adequate  Plan: Continue current plan  Rexene Alberts, MD 07/08/2015 5:50 PM

## 2015-07-08 NOTE — Progress Notes (Signed)
3 Days Post-Op Procedure(s) (LRB): BENTALL PROCEDURE UTILIZING A 28 X 10 MM WOVEN DOUBLE VELOUR VASCULAR GRAFT AND A 28MM GELWEAVE WOVEN VASCULAR GRAFT. 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 (N/A) TRANSESOPHAGEAL ECHOCARDIOGRAM (TEE) (N/A) Subjective:  No complaints. Walked this am   Developed some serosanguinous drainage from the lower end of chest incision overnight.  Objective: Vital signs in last 24 hours: Temp:  [98.3 F (36.8 C)-98.7 F (37.1 C)] 98.4 F (36.9 C) (01/06 0731) Pulse Rate:  [71-93] 85 (01/06 0700) Cardiac Rhythm:  [-] Normal sinus rhythm (01/06 0700) Resp:  [8-19] 13 (01/06 0700) BP: (84-134)/(52-86) 114/86 mmHg (01/06 0700) SpO2:  [90 %-100 %] 99 % (01/06 0700) Weight:  [102.5 kg (225 lb 15.5 oz)] 102.5 kg (225 lb 15.5 oz) (01/06 0500)  Hemodynamic parameters for last 24 hours:    Intake/Output from previous day: 01/05 0701 - 01/06 0700 In: 393.2 [I.V.:13.2; Blood:380] Out: [id number] [Urine:1710; Chest Tube:120] Intake/Output this shift:    General appearance: alert and cooperative Neurologic: intact Heart: regular rate and rhythm, S1, S2 normal, no murmur, click, rub or gallop Lungs: clear to auscultation bilaterally Abdomen: soft, non-tender; bowel sounds normal; no masses,  no organomegaly Extremities: edema mild Wound: incision intact but small amount of serosanguinous drainage from the lower end  Lab Results:  Recent Labs  07/07/15 0345 07/08/15 0435  WBC 19.2* 13.8*  HGB 7.2* 7.6*  HCT 21.3* 22.7*  PLT 96* 78*   BMET:  Recent Labs  07/07/15 0345 07/08/15 0435  NA 137 136  K 4.1 4.3  CL 108 106  CO2 23 25  GLUCOSE 122* 118*  BUN 26* 27*  CREATININE 1.25* 1.33*  CALCIUM 8.2* 8.1*    PT/INR:  Recent Labs  07/05/15 1955  LABPROT 18.3*  INR 1.51*   ABG    Component Value Date/Time   PHART 7.374 07/06/2015 0328   HCO3 19.3* 07/06/2015 0328   TCO2 17 07/06/2015 1703   ACIDBASEDEF  5.0* 07/06/2015 0328   O2SAT 94.0 07/06/2015 0328   CBG (last 3)   Recent Labs  07/05/15 1957 07/06/15 07/06/15 0319  GLUCAP 97 113* 136*   CXR: ok  Assessment/Plan: S/P Procedure(s) (LRB): BENTALL PROCEDURE UTILIZING A 28 X 10 MM WOVEN DOUBLE VELOUR VASCULAR GRAFT AND A 28MM GELWEAVE WOVEN VASCULAR GRAFT. 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 (N/A) TRANSESOPHAGEAL ECHOCARDIOGRAM (TEE) (N/A)  He remains hemodynamically stable  Chronic kidney disease with one kidney s/p nephrectomy. Creat is stable.  Volume excess: continue diuresis.  Postop acute blood loss anemia: Hgb up slightly after second unit of prbc's yesterday. He feels ok and is hemodynamically stable. I don't think there is any active blood loss. Will follow repeat in am. Continue Fe2+.  Thrombocytopenia: Likely related to surgery. Will follow. He is not on heparin or lovenox and holding ASA.  Continue mobilization and IS.  Will keep in ICU until drainage from chest incision stops.   LOS: 3 days    [name] 07/08/2015

## 2015-07-09 LAB — CBC
HEMATOCRIT: 22 % — AB (ref 39.0–52.0)
HEMOGLOBIN: 7.3 g/dL — AB (ref 13.0–17.0)
MCH: 29.1 pg (ref 26.0–34.0)
MCHC: 33.2 g/dL (ref 30.0–36.0)
MCV: 87.6 fL (ref 78.0–100.0)
Platelets: 95 10*3/uL — ABNORMAL LOW (ref 150–400)
RBC: 2.51 MIL/uL — ABNORMAL LOW (ref 4.22–5.81)
RDW: 14.8 % (ref 11.5–15.5)
WBC: 10.4 10*3/uL (ref 4.0–10.5)

## 2015-07-09 LAB — BASIC METABOLIC PANEL
Anion gap: 7 (ref 5–15)
BUN: 22 mg/dL — ABNORMAL HIGH (ref 6–20)
CALCIUM: 8.2 mg/dL — AB (ref 8.9–10.3)
CHLORIDE: 105 mmol/L (ref 101–111)
CO2: 24 mmol/L (ref 22–32)
CREATININE: 1.16 mg/dL (ref 0.61–1.24)
GFR calc non Af Amer: >60 mL/min (ref >60)
GLUCOSE: 117 mg/dL — AB (ref 65–99)
Potassium: 3.8 mmol/L (ref 3.5–5.1)
Sodium: 136 mmol/L (ref 135–145)

## 2015-07-09 MED ORDER — MOVING RIGHT ALONG BOOK
Freq: Once | Status: AC
  Administered 2015-07-09: 11:00:00
  Filled 2015-07-09: qty 1

## 2015-07-09 MED ORDER — FUROSEMIDE 40 MG PO TABS
40.0000 mg | ORAL_TABLET | Freq: Every day | ORAL | Status: DC
  Administered 2015-07-10 – 2015-07-11 (×2): 40 mg via ORAL
  Filled 2015-07-09 (×2): qty 1

## 2015-07-09 MED ORDER — IRBESARTAN 150 MG PO TABS
75.0000 mg | ORAL_TABLET | Freq: Every day | ORAL | Status: DC
  Administered 2015-07-09 – 2015-07-11 (×3): 75 mg via ORAL
  Filled 2015-07-09 (×4): qty 1

## 2015-07-09 MED ORDER — SODIUM CHLORIDE 0.9 % IJ SOLN: 3.0000 mL | INTRAMUSCULAR | Status: DC | PRN

## 2015-07-09 MED ORDER — FUROSEMIDE 10 MG/ML IJ SOLN
40.0000 mg | Freq: Once | INTRAMUSCULAR | Status: AC
  Administered 2015-07-09: 40 mg via INTRAVENOUS
  Filled 2015-07-09: qty 4

## 2015-07-09 MED ORDER — AMIODARONE HCL 200 MG PO TABS
200.0000 mg | ORAL_TABLET | Freq: Two times a day (BID) | ORAL | Status: DC
  Administered 2015-07-09 – 2015-07-10 (×3): 200 mg via ORAL
  Filled 2015-07-09 (×3): qty 1

## 2015-07-09 MED ORDER — POTASSIUM CHLORIDE CRYS ER 20 MEQ PO TBCR
20.0000 meq | EXTENDED_RELEASE_TABLET | Freq: Every day | ORAL | Status: DC
  Administered 2015-07-10 – 2015-07-11 (×2): 20 meq via ORAL
  Filled 2015-07-09 (×2): qty 1

## 2015-07-09 MED ORDER — SODIUM CHLORIDE 0.9 % IV SOLN: 250.0000 mL | INTRAVENOUS | Status: DC | PRN

## 2015-07-09 MED ORDER — SODIUM CHLORIDE 0.9 % IJ SOLN
3.0000 mL | Freq: Two times a day (BID) | INTRAMUSCULAR | Status: DC
  Administered 2015-07-09 – 2015-07-14 (×8): 3 mL via INTRAVENOUS

## 2015-07-09 NOTE — Progress Notes (Signed)
Attempted to call report. RN to call back. 

## 2015-07-09 NOTE — Progress Notes (Addendum)
      [address]       [place] [address]             [phone]        CARDIOTHORACIC SURGERY PROGRESS NOTE   R4 Days Post-Op Procedure(s) (LRB): BENTALL PROCEDURE UTILIZING A 28 X 10 MM WOVEN DOUBLE VELOUR VASCULAR GRAFT AND A 28MM GELWEAVE WOVEN VASCULAR GRAFT. 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 (N/A) TRANSESOPHAGEAL ECHOCARDIOGRAM (TEE) (N/A)  Subjective: Feels well and looks good.  Mild soreness.  Appetite improved.  Objective: Vital signs: BP Readings from Last 1 Encounters:  07/09/15 115/98   Pulse Readings from Last 1 Encounters:  07/09/15 70   Resp Readings from Last 1 Encounters:  07/09/15 21   Temp Readings from Last 1 Encounters:  07/09/15 97.7 F (36.5 C) Oral    Hemodynamics:    Physical Exam:  Rhythm:   sinus  Breath sounds: clear  Heart sounds:  RRR  Incisions:  Clean and dry  Abdomen:  Soft, non-distended, non-tender  Extremities:  Warm, well-perfused    Intake/Output from previous day: 01/06 0701 - 01/07 0700 In: 716.8 [P.O.:200; I.V.:516.8] Out: 1400 [Urine:1400] Intake/Output this shift:    Lab Results:  CBC: Recent Labs  07/08/15 0435 07/09/15 0222  WBC 13.8* 10.4  HGB 7.6* 7.3*  HCT 22.7* 22.0*  PLT 78* 95*    BMET:  Recent Labs  07/08/15 0435 07/09/15 0222  NA 136 136  K 4.3 3.8  CL 106 105  CO2 25 24  GLUCOSE 118* 117*  BUN 27* 22*  CREATININE 1.33* 1.16  CALCIUM 8.1* 8.2*     PT/INR:  No results for input(s): LABPROT, INR in the last 72 hours.  CBG (last 3)  No results for input(s): GLUCAP in the last 72 hours.  ABG    Component Value Date/Time   PHART 7.374 07/06/2015 0328   PCO2ART 33.0* 07/06/2015 0328   PO2ART 72.0* 07/06/2015 0328   HCO3 19.3* 07/06/2015 0328   TCO2 17 07/06/2015 1703   ACIDBASEDEF 5.0* 07/06/2015 0328   O2SAT 94.0 07/06/2015 0328    CXR: n/a  Assessment/Plan: S/P Procedure(s) (LRB): BENTALL PROCEDURE  UTILIZING A 28 X 10 MM WOVEN DOUBLE VELOUR VASCULAR GRAFT AND A 28MM GELWEAVE WOVEN VASCULAR GRAFT. 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 (N/A) TRANSESOPHAGEAL ECHOCARDIOGRAM (TEE) (N/A)  Overall doing well POD4 Maintaining NSR on IV amiodarone BP stable, slowly trending up O2 sats 96% on RA Expected post op acute blood loss anemia, Hgb unchanged @ 7.3 Expected post op volume excess, mild Expected post op atelectasis, mild   Mobilize  Convert amiodarone to oral  Diuresis  Watch anemia  Restart Micardis (ARB)  Transfer 2W  Rexene Alberts, MD 07/09/2015 10:52 AM

## 2015-07-10 ENCOUNTER — Inpatient Hospital Stay (HOSPITAL_COMMUNITY): Payer: BLUE CROSS/BLUE SHIELD

## 2015-07-10 LAB — CBC
HCT: 22.9 % — ABNORMAL LOW (ref 39.0–52.0)
Hemoglobin: 7.6 g/dL — ABNORMAL LOW (ref 13.0–17.0)
MCH: 29.3 pg (ref 26.0–34.0)
MCHC: 33.2 g/dL (ref 30.0–36.0)
MCV: 88.4 fL (ref 78.0–100.0)
PLATELETS: 136 10*3/uL — AB (ref 150–400)
RBC: 2.59 MIL/uL — ABNORMAL LOW (ref 4.22–5.81)
RDW: 14.8 % (ref 11.5–15.5)
WBC: 8.7 10*3/uL (ref 4.0–10.5)

## 2015-07-10 LAB — BASIC METABOLIC PANEL
Anion gap: 4 — ABNORMAL LOW (ref 5–15)
BUN: 22 mg/dL — ABNORMAL HIGH (ref 6–20)
CALCIUM: 8.2 mg/dL — AB (ref 8.9–10.3)
CO2: 27 mmol/L (ref 22–32)
CREATININE: 1.21 mg/dL (ref 0.61–1.24)
Chloride: 105 mmol/L (ref 101–111)
Glucose, Bld: 112 mg/dL — ABNORMAL HIGH (ref 65–99)
Potassium: 4 mmol/L (ref 3.5–5.1)
SODIUM: 136 mmol/L (ref 135–145)

## 2015-07-10 MED ORDER — COUMADIN BOOK
Freq: Once | Status: AC
  Filled 2015-07-10: qty 1

## 2015-07-10 MED ORDER — WARFARIN - PHYSICIAN DOSING INPATIENT
Freq: Every day | Status: DC
  Administered 2015-07-13 – 2015-07-14 (×2)

## 2015-07-10 MED ORDER — METOPROLOL TARTRATE 12.5 MG HALF TABLET
12.5000 mg | ORAL_TABLET | Freq: Two times a day (BID) | ORAL | Status: DC
  Administered 2015-07-10 (×2): 12.5 mg via ORAL
  Filled 2015-07-10 (×2): qty 1

## 2015-07-10 MED ORDER — WARFARIN SODIUM 2.5 MG PO TABS
2.5000 mg | ORAL_TABLET | Freq: Every day | ORAL | Status: DC
  Administered 2015-07-10: 2.5 mg via ORAL
  Filled 2015-07-10: qty 1

## 2015-07-10 MED ORDER — WARFARIN VIDEO: Freq: Once | Status: AC

## 2015-07-10 MED ORDER — AMIODARONE HCL 200 MG PO TABS
400.0000 mg | ORAL_TABLET | Freq: Two times a day (BID) | ORAL | Status: DC
  Administered 2015-07-10 – 2015-07-15 (×10): 400 mg via ORAL
  Filled 2015-07-10 (×11): qty 2

## 2015-07-10 NOTE — Progress Notes (Signed)
Patient's last bowel movement was prior to admission.  Patient currently denies constipation and states that he hadn't started eating until yesterday.  Spoke with patient about the importance of bowel movements and risk of constipation/impaction; patient verbalizes understanding.  Patient agreed to take scheduled medication; patient currently refusing additional interventions but states that he may require additional interventions tomorrow.  Will continue to monitor.

## 2015-07-10 NOTE — Progress Notes (Addendum)
[address]       [place] [address]             [phone]      5 Days Post-Op Procedure(s) (LRB): BENTALL PROCEDURE UTILIZING A 28 X 10 MM WOVEN DOUBLE VELOUR VASCULAR GRAFT AND A 28MM GELWEAVE WOVEN VASCULAR GRAFT. 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 (N/A) TRANSESOPHAGEAL ECHOCARDIOGRAM (TEE) (N/A) Subjective: Feels generally well, having some afib into 120's .   Objective: Vital signs in last 24 hours: Temp:  [97.2 F (36.2 C)-99.3 F (37.4 C)] 97.7 F (36.5 C) (01/08 0443) Pulse Rate:  [80-87] 87 (01/08 0443) Cardiac Rhythm:  [-] Normal sinus rhythm;Bundle branch block (01/07 1900) Resp:  [12-34] 15 (01/08 0443) BP: (103-147)/(58-84) 147/84 mmHg (01/08 0443) SpO2:  [97 %-100 %] 100 % (01/08 0443) Weight:  [223 lb 15.8 oz (101.6 kg)] 223 lb 15.8 oz (101.6 kg) (01/08 0023)  Hemodynamic parameters for last 24 hours:    Intake/Output from previous day: 01/07 0701 - 01/08 0700 In: 177.8 [P.O.:111; I.V.:66.8] Out: 2175 [Urine:2175] Intake/Output this shift:    General appearance: alert, cooperative and no distress Heart: irregularly irregular rhythm Lungs: clear to auscultation bilaterally Abdomen: benign exam Extremities: min edema in extrems Wound: incis healing well  Lab Results:  Recent Labs  07/09/15 0222 07/10/15 0400  WBC 10.4 8.7  HGB 7.3* 7.6*  HCT 22.0* 22.9*  PLT 95* 136*   BMET:  Recent Labs  07/09/15 0222 07/10/15 0400  NA 136 136  K 3.8 4.0  CL 105 105  CO2 24 27  GLUCOSE 117* 112*  BUN 22* 22*  CREATININE 1.16 1.21  CALCIUM 8.2* 8.2*    PT/INR: No results for input(s): LABPROT, INR in the last 72 hours. ABG    Component Value Date/Time   PHART 7.374 07/06/2015 0328   HCO3 19.3* 07/06/2015 0328   TCO2 17 07/06/2015 1703   ACIDBASEDEF 5.0* 07/06/2015 0328   O2SAT 94.0 07/06/2015 0328   CBG (last 3)  No results for input(s): GLUCAP in the last 72  hours.  Meds Scheduled Meds: . acetaminophen  1,000 mg Oral 4 times per day   Or  . acetaminophen (TYLENOL) oral liquid 160 mg/5 mL  1,000 mg Per Tube 4 times per day  . amiodarone  200 mg Oral BID PC  . antiseptic oral rinse  7 mL Mouth Rinse BID  . bisacodyl  10 mg Oral Daily   Or  . bisacodyl  10 mg Rectal Daily  . docusate sodium  200 mg Oral Daily  . ferrous gluconate  324 mg Oral BID WC  . fluticasone  1 spray Each Nare Daily  . furosemide  40 mg Oral Daily  . irbesartan  75 mg Oral Daily  . pantoprazole  40 mg Oral Daily  . potassium chloride  20 mEq Oral Daily  . sodium chloride  3 mL Intravenous Q12H  . sodium chloride  3 mL Intravenous Q12H   Continuous Infusions: . sodium chloride 250 mL (07/06/15 0559)  . sodium chloride Stopped (07/06/15 1000)   PRN Meds:.sodium chloride, metoCLOPramide (REGLAN) injection, ondansetron (ZOFRAN) IV, oxyCODONE, sodium chloride, sodium chloride, traMADol  Xrays No results found.  Assessment/Plan: S/P Procedure(s) (LRB): BENTALL PROCEDURE UTILIZING A 28 X 10 MM WOVEN DOUBLE VELOUR VASCULAR GRAFT AND A 28MM GELWEAVE WOVEN VASCULAR GRAFT. 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 (N/A) TRANSESOPHAGEAL ECHOCARDIOGRAM (TEE) (N/A)  1  conts to progress well 2 afib on po  amio, wil add low dose beta blocker as BP should be able to tolerate 3 H/H is stable - trying to avoid transfusion as clinically appears to be tolerating. Thrombocytopenia is improved 4 cont gentle diuresis, creat is stable  LOS: 5 days    GOLD,WAYNE E 07/10/2015  I have seen and examined the patient and agree with the assessment and plan as outlined.  Will also increase amiodarone to 400 bid.  May need to start coumadin.  Drainage from sternal incision has decreased.  Rexene Alberts, MD 07/10/2015 10:56 AM

## 2015-07-10 NOTE — Progress Notes (Signed)
Received notification from telemetry that patient is running Afib; patient asymptomatic.  Notified Jadene Pierini, [place] of change in rhythm.  No new orders received; will continue to monitor.

## 2015-07-10 NOTE — Progress Notes (Signed)
Patient ambulated with NT around the nursing station and back to room.

## 2015-07-11 LAB — PROTIME-INR
INR: 1.23 (ref 0.00–1.49)
PROTHROMBIN TIME: 15.7 s — AB (ref 11.6–15.2)

## 2015-07-11 MED ORDER — METOPROLOL TARTRATE 25 MG PO TABS
25.0000 mg | ORAL_TABLET | Freq: Two times a day (BID) | ORAL | Status: DC
  Administered 2015-07-11: 25 mg via ORAL
  Filled 2015-07-11: qty 1

## 2015-07-11 MED ORDER — METOPROLOL TARTRATE 50 MG PO TABS
50.0000 mg | ORAL_TABLET | Freq: Two times a day (BID) | ORAL | Status: DC
  Administered 2015-07-11 – 2015-07-14 (×7): 50 mg via ORAL
  Filled 2015-07-11 (×7): qty 1

## 2015-07-11 MED ORDER — ACETAMINOPHEN 325 MG PO TABS
650.0000 mg | ORAL_TABLET | Freq: Four times a day (QID) | ORAL | Status: DC | PRN
  Administered 2015-07-12 (×2): 650 mg via ORAL
  Filled 2015-07-11 (×3): qty 2

## 2015-07-11 MED ORDER — LACTULOSE 10 GM/15ML PO SOLN
20.0000 g | Freq: Once | ORAL | Status: AC
  Administered 2015-07-11: 20 g via ORAL
  Filled 2015-07-11: qty 30

## 2015-07-11 MED ORDER — WARFARIN SODIUM 5 MG PO TABS
5.0000 mg | ORAL_TABLET | Freq: Once | ORAL | Status: AC
  Administered 2015-07-11: 5 mg via ORAL
  Filled 2015-07-11: qty 1

## 2015-07-11 NOTE — Progress Notes (Signed)
CARDIAC REHAB PHASE I   PRE:  Rate/Rhythm: 108 afib    BP: sitting 132/60    SaO2: 97 RA  MODE:  Ambulation: 550 ft   POST:  Rate/Rhythm: 143 afib    BP: sitting 128/80     SaO2: 98 RA  Pt moving very well. Feels good however HR up with walking, highest in 140s. Pt sts he can feel his HR being high. To recliner. IV is coming out. Discussed walking independently today with RN's permission. Pt is inspiring 2250 mL on IS. Set up d/c video for pt to watch. His aunt is planning on being with him at d/c.  [phone]   Darrick Meigs CES, ACSM 07/11/2015 9:54 AM

## 2015-07-11 NOTE — Progress Notes (Signed)
Spoke with Dr. Cyndia Bent and will not take out epicardial wires today.  Will readdress in the morning. Payton Emerald, RN

## 2015-07-11 NOTE — Progress Notes (Addendum)
[address]       [place] [address]             [phone]        6 Days Post-Op Procedure(s) (LRB): BENTALL PROCEDURE UTILIZING A 28 X 10 MM WOVEN DOUBLE VELOUR VASCULAR GRAFT AND A 28MM GELWEAVE WOVEN VASCULAR GRAFT. 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 (N/A) TRANSESOPHAGEAL ECHOCARDIOGRAM (TEE) (N/A)  Subjective: Patient with incisional pain, constipation, and headache this am  Objective: Vital signs in last 24 hours: Temp:  [98.1 F (36.7 C)-98.6 F (37 C)] 98.1 F (36.7 C) (01/09 0340) Pulse Rate:  [74-107] 81 (01/09 0340) Cardiac Rhythm:  [-] Normal sinus rhythm (01/08 2005) Resp:  [18-20] 18 (01/09 0340) BP: (108-138)/(70-91) 138/80 mmHg (01/09 0340) SpO2:  [97 %-99 %] 97 % (01/09 0340) Weight:  [223 lb (101.152 kg)] 223 lb (101.152 kg) (01/09 0340)  Pre op weight 100 kg Current Weight  07/11/15 223 lb (101.152 kg)       Intake/Output from previous day: 01/08 0701 - 01/09 0700 In: 600 [P.O.:600] Out: 1400 [Urine:1400]   Physical Exam:  Cardiovascular: RRR, no murmurs,  Pulmonary: Clear to auscultation bilaterally; no rales, wheezes, or rhonchi. Abdomen: Soft, non tender, bowel sounds present. Extremities: Mild bilateral lower extremity edema. Wounds: Clean and dry.  No erythema or signs of infection.  Lab Results: CBC: Recent Labs  07/09/15 0222 07/10/15 0400  WBC 10.4 8.7  HGB 7.3* 7.6*  HCT 22.0* 22.9*  PLT 95* 136*   BMET:  Recent Labs  07/09/15 0222 07/10/15 0400  NA 136 136  K 3.8 4.0  CL 105 105  CO2 24 27  GLUCOSE 117* 112*  BUN 22* 22*  CREATININE 1.16 1.21  CALCIUM 8.2* 8.2*    PT/INR:  Lab Results  Component Value Date   INR 1.23 07/11/2015   INR 1.51* 07/05/2015   INR 1.60* 07/05/2015   ABG:  INR: Will add last result for INR, ABG once components are confirmed Will add last 4 CBG results once components are confirmed  Assessment/Plan:  1. CV -  A fib with RVR. A fib with HR in the low 100's this am.On Amiodarone 400 mg bid, Lopressor 12.5 mg bid, Avapro 75 mg daily, Coumadin. INR 1.23. Will increase Lopressor to 25 mg bid. Monitor BP-if systolic less than [id number] will stop Avapro. 2.  Pulmonary - On room air. Encourage incentive spirometer 3. Volume Overload - On Lasix 40 mg daily. 4.  Acute blood loss anemia - H and H stable 7.6 and 22.9. Continue Fergon. 5. Remove EPW 6. Tylenol PRN headache  ZIMMERMAN,DONIELLE MPA-C 07/11/2015,7:30 AM   Chart reviewed, patient examined, agree with above. He was reportedly fine this am and then suddenly was confused, did not remember having surgery. He has a non-focal neuro exam and seems mentally much clearer now but knows that he was confused. Asking a lot of questions about details of surgery and hospitalization that he can't remember. I am not sure why this has occurred. He has been fine since surgery until this am. It could be some postop delirium but could also be a small stroke with atrial fibrillation. He is non-focal and improving so I don't think there is a benefit to getting a head CT. He received one dose of coumadin last night but INR is only 1.23 this am. Will give 5 mg tonight. I am hesitant to start heparin since he had significant  postop thrombocytopenia that could have been heparin related. Continue close observation.  He is still in atrial fib with RVR in low 100's at rest. Will increase lopressor to 50 bid and stop Avapro for now. Continue amiodarone.

## 2015-07-11 NOTE — Progress Notes (Signed)
NT Alvie Heidelberg stated that patient suddenly became confused and did not remember seeing her.  I assessed patient and did not note any aphagia or motor deficit. Patient was confused.  He knows his name and birthday. He remembers RN Estill Bamberg and RN Katharine Look but does not know why he is in the hospital and what is going on.  Patient feels he is confused. VVS. Patient continues to repeat same questions over and over.  I called rapid response RN to do a second assess.  Dr. Cyndia Bent saw patient and made some medication changes. Pt resting with call bell within reach.  Will continue to monitor. Payton Emerald, RN

## 2015-07-11 NOTE — Progress Notes (Signed)
Patient is confused and repeats the same question frequently.  Initially he did not know his age or why he was in the hospital.  He has no aphasia or dysarthria. NIHSS 1 for missed age. Patient in AF.  VSS, see flowsheet.  Dr Cyndia Bent at bedside to assess patient.  RN to call if assistance needed.

## 2015-07-11 NOTE — Progress Notes (Signed)
UR Completed. Verdine Grenfell, RN, BSN.  [phone] 

## 2015-07-12 LAB — BASIC METABOLIC PANEL
Anion gap: 7 (ref 5–15)
BUN: 20 mg/dL (ref 6–20)
CALCIUM: 8.6 mg/dL — AB (ref 8.9–10.3)
CHLORIDE: 105 mmol/L (ref 101–111)
CO2: 26 mmol/L (ref 22–32)
CREATININE: 1.38 mg/dL — AB (ref 0.61–1.24)
GFR calc Af Amer: >60 mL/min (ref >60)
GFR calc non Af Amer: 53 mL/min — ABNORMAL LOW (ref >60)
Glucose, Bld: 111 mg/dL — ABNORMAL HIGH (ref 65–99)
Potassium: 4.7 mmol/L (ref 3.5–5.1)
SODIUM: 138 mmol/L (ref 135–145)

## 2015-07-12 LAB — CBC
HCT: 24.8 % — ABNORMAL LOW (ref 39.0–52.0)
HEMOGLOBIN: 8 g/dL — AB (ref 13.0–17.0)
MCH: 29 pg (ref 26.0–34.0)
MCHC: 32.3 g/dL (ref 30.0–36.0)
MCV: 89.9 fL (ref 78.0–100.0)
Platelets: 177 10*3/uL (ref 150–400)
RBC: 2.76 MIL/uL — ABNORMAL LOW (ref 4.22–5.81)
RDW: 15 % (ref 11.5–15.5)
WBC: 8.8 10*3/uL (ref 4.0–10.5)

## 2015-07-12 LAB — PROTIME-INR
INR: 1.2 (ref 0.00–1.49)
PROTHROMBIN TIME: 15.3 s — AB (ref 11.6–15.2)

## 2015-07-12 MED ORDER — ASPIRIN EC 81 MG PO TBEC
81.0000 mg | DELAYED_RELEASE_TABLET | Freq: Every day | ORAL | Status: DC
  Administered 2015-07-12 – 2015-07-14 (×3): 81 mg via ORAL
  Filled 2015-07-12 (×3): qty 1

## 2015-07-12 MED ORDER — WARFARIN SODIUM 5 MG PO TABS
5.0000 mg | ORAL_TABLET | Freq: Every day | ORAL | Status: DC
  Administered 2015-07-12 – 2015-07-13 (×2): 5 mg via ORAL
  Filled 2015-07-12 (×2): qty 1

## 2015-07-12 MED ORDER — ACETAMINOPHEN 325 MG PO TABS
650.0000 mg | ORAL_TABLET | ORAL | Status: DC | PRN
  Administered 2015-07-12 – 2015-07-13 (×4): 650 mg via ORAL
  Filled 2015-07-12 (×3): qty 2

## 2015-07-12 NOTE — Progress Notes (Signed)
CARDIAC REHAB PHASE I   PRE:  Rate/Rhythm: 71 SR  BP:  Supine:   Sitting: 112/80  Standing:    SaO2: 98%RA  MODE:  Ambulation: 750 ft   POST:  Rate/Rhythm: 80 SR  BP:  Supine: 130/80  Sitting:   Standing:    SaO2: 98%RA 1057-1130 Pt walked 750 ft on RA with rolling walker with steady gait. Tolerated well. Pt talked about going to school at [employer] now  and did not seem to have any difficulty with conversation. He did not discuss recent confusion that he had. Pt stated he really wanted to get home soon. Remained in NSR.   Graylon Good, RN BSN  07/12/2015 11:26 AM

## 2015-07-12 NOTE — Progress Notes (Addendum)
      [address]       [place] [address]             [phone]        7 Days Post-Op Procedure(s) (LRB): BENTALL PROCEDURE UTILIZING A 28 X 10 MM WOVEN DOUBLE VELOUR VASCULAR GRAFT AND A 28MM GELWEAVE WOVEN VASCULAR GRAFT. 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 (N/A) TRANSESOPHAGEAL ECHOCARDIOGRAM (TEE) (N/A)  Subjective: Events noted yesterday. Still with intermittent confusion  Objective: Vital signs in last 24 hours: Temp:  [97.9 F (36.6 C)-98.9 F (37.2 C)] 98 F (36.7 C) (01/10 0432) Pulse Rate:  [65-114] 72 (01/10 0432) Cardiac Rhythm:  [-] Normal sinus rhythm (01/10 0400) Resp:  [18-20] 20 (01/10 0432) BP: (102-134)/(72-112) 119/72 mmHg (01/10 0432) SpO2:  [97 %-100 %] 99 % (01/09 2211) Weight:  [218 lb 12.8 oz (99.247 kg)] 218 lb 12.8 oz (99.247 kg) (01/10 0432)  Pre op weight 100 kg Current Weight  07/12/15 218 lb 12.8 oz (99.247 kg)      Intake/Output from previous day: 01/09 0701 - 01/10 0700 In: 840 [P.O.:840] Out: 1301 [Urine:1300; Stool:1]   Physical Exam:  Cardiovascular: RRR  Pulmonary: Clear to auscultation bilaterally; no rales, wheezes, or rhonchi. Abdomen: Soft, non tender, bowel sounds present. Extremities:Trace bilateral lower extremity edema. Wounds: Clean and dry.  No erythema or signs of infection. Neurologic: NO focal deficits  Lab Results: CBC:  Recent Labs  07/10/15 0400 07/12/15 0219  WBC 8.7 8.8  HGB 7.6* 8.0*  HCT 22.9* 24.8*  PLT 136* 177   BMET:   Recent Labs  07/10/15 0400 07/12/15 0219  NA 136 138  K 4.0 4.7  CL 105 105  CO2 27 26  GLUCOSE 112* 111*  BUN 22* 20  CREATININE 1.21 1.38*  CALCIUM 8.2* 8.6*    PT/INR:  Lab Results  Component Value Date   INR 1.20 07/12/2015   INR 1.23 07/11/2015   INR 1.51* 07/05/2015   ABG:  INR: Will add last result for INR, ABG once components are confirmed Will add last 4 CBG results once components  are confirmed  Assessment/Plan:  1. CV - Previous A fib with RVR. SR in the 70's this am.On Amiodarone 400 mg bid, Lopressor 50 mg bid, Coumadin. INR 1.20. Will continue with Coumadin 5 mg daily.  2.  Pulmonary - On room air. Encourage incentive spirometer 3. Volume Overload - On Lasix 40 mg daily. 4.  Acute blood loss anemia - H and H stable 8 and 24.8. Continue Fergon. 5. Remove EPW 6. Thrombocytopenia resolved-platelets up to 177,000 7. Creatinine slightly increased from 1.21 to 1.38. Avapro stopped yesterday but on Lasix. Continue to monitor 8. Regarding confusion, questionable etiology. Will continue to monitor.  ZIMMERMAN,DONIELLE MPA-C 07/12/2015,7:45 AM    Chart reviewed, patient examined, agree with above. He still has memory loss of the past weeks events but otherwise seems fine. He is back in sinus rhythm today. Hgb increasing. Plts increasing. INR is onl 1.20. Will continue coumadin 5 mg daily and start ASA 81 mg daily. Plan home with his aunt once INR bumps and we know what dose of coumadin to send him out with. His weight is below preop so will stop lasix to prevent dehydration.

## 2015-07-12 NOTE — Discharge Summary (Signed)
Physician Discharge Summary       [address]       [place] [address]             [phone]    Patient ID: JONES STRIMPLE MRN: [id number] DOB/AGE: 1951/06/10 65 y.o.  Admit date: 07/05/2015 Discharge date: 07/15/2015  Principle Diagnoses: Bicuspid aortic valve and 5.2 cm ascending aortic aneurysm  Additional Diagnoses:  1. Hypertension 2. CKD (chronic kidney disease)-stage III 3. Renal cell carcinoma (s/p right nephrectomy) 4. ABL anemia 5. Mild thrombocytopenia-resolved prior to discharge 6. Post op a fib 7. Intermittent confusion post op-questionable etiology  Procedure (s):  1. Median Sternotomy 2. Extracorporeal circulation 3. Resection and grafting of ascending aortic aneurysm using a 28 mm Hemashield graft under deep hypothermic circulatory arrest. 4. Bentall procedure using a composite 25 mm Edwards Magna-Ease pericardial valve and 28 mm Gelweave valsalva graft by Dr. Cyndia Bent on 07/05/2015.  History of Presenting Illness: This is a 65 year old gentleman with a history of hypertension and renal cell carcinoma s/p right nephrectomy who is followed at [facility] by Dr. Celso Sickle. He was seen there for his routine follow up in October and a CXR reportedly showed a dilated ascending aorta. He underwent an echo on 06/02/2015 in [place] which showed a dilated ascending aorta at 52 mm diameter with the distal ascending aorta being 40 mm and the proximal arch 38 mm. The aortic root was 46 mm. The images of the aortic valve suggested a bicuspid valve with mildly thickened leaflets and mild AI. The LVEF was normal. He denies chest pain, shortness of breath, and dizziness. A CT of the chest without contrast shows a fusiform aortic root and ascending aortic aneurysm with a maximum diameter of 5.2 cm at the level of the right PA. The aortic root is 4.6 cm and the proximal arch at the level of the innominate artery is 3.8 cm. The is extensive calcification of the mitral  annulus extending over to the aortic valve. His echo showed a bicuspid aortic valve with thickening and some calcification of the leaflets with restricted motion of one of the leaflets. Cardiac cath shows normal coronary arteries.  Dr. Cyndia Bent reviewed the echo, cardiac catheterization, and CT of the chest. Because of his bicuspid aortic valve and ascending aortic aneurysm, Dr. Cyndia Bent discussed the need for Bentall. Per patient's request, he preferred a tissue valve rather than a mechanical valve. Potential risks, benefits, and complications were discussed with the surgery and he agreed to proceed. Pre operative carotid duplex US showed no significant internal carotid artery stenosis bilaterally. ABIs were 1.25 on the right and 1.21 on the left. He underwent a Bentall on 07/05/2015.   Brief Hospital Course:  The patient was extubated early the morning of post op day one without difficulty. He remained afebrile and hemodynamically stable. He was weaned off of Dopamine and Neo synephrine drips. Gordy Councilman, a line, chest tubes, and foley were removed early in the post operative course. Lopressor was started and titrated accordingly. He went into a fib with RVR. He was started on Amiodarone. He had PAF and was started on Coumadin. PT and INR were monitored daily. His last INR was up to 1.69. His rhythm was PAF with controlled ventricular rate. He was volume over loaded and diuresed. He had ABL anemia. He did require a post op transfusion. He was started on Fergon. His last H and H was 8 and 24.8. He was weaned off the insulin drip. The  patient's glucose remained well controlled. The patient's HGA1C pre op was 5.6.  The patient was felt surgically stable for transfer from the ICU to [facility] for further convalescence on 07/10/2015. He continues to progress with cardiac rehab. He was ambulating on room air. He has been tolerating a diet and has had a bowel movement. He then developed consution on 01/09. He had no focal  neuro deficits. He was oriented to person and place;however, he did not remember heart surgery. Etiology of this intermittent confusion is unknown. It was felt that a CT of the head would not be benefical at this time. This confusion did resolve. Epicardial pacing wires were removed on 01/10. Chest tube sutures will remain and be removed after discharge in the office. The patient is felt surgically stable for discharge today.   Latest Vital Signs: Blood pressure 110/48, pulse 70, temperature 98.1 F (36.7 C), temperature source Oral, resp. rate 20, height 5\' 10"  (1.778 m), weight 211 lb 6.7 oz (95.9 kg), SpO2 98 %.  Physical Exam: Cardiovascular: RRR  Pulmonary: Clear to auscultation bilaterally; no rales, wheezes, or rhonchi. Abdomen: Soft, non tender, bowel sounds present. Extremities:No lower extremity edema. Wounds: Clean and dry. No erythema or signs of infection. Neurologic: NO focal deficits  Discharge Condition:Stable and discharged to home  Recent laboratory studies:  Lab Results  Component Value Date   WBC 8.8 07/12/2015   HGB 8.0* 07/12/2015   HCT 24.8* 07/12/2015   MCV 89.9 07/12/2015   PLT 177 07/12/2015   Lab Results  Component Value Date   NA 138 07/13/2015   K 4.7 07/13/2015   CL 105 07/13/2015   CO2 26 07/13/2015   CREATININE 1.33* 07/13/2015   GLUCOSE 100* 07/13/2015    Diagnostic Studies: Dg Chest 2 View  07/10/2015  CLINICAL DATA:  Patient with atelectasis. Status post open heart surgery. EXAM: CHEST  2 VIEW COMPARISON:  Chest radiograph 07/08/2015. FINDINGS: Stable cardiomegaly status post median sternotomy. Multiple monitoring leads overlie the patient. Minimal atelectasis within the bilateral lung bases. Small bilateral pleural effusions. Mid thoracic spine degenerative changes. IMPRESSION: Small bilateral pleural effusions with basilar atelectasis. No definite pneumothorax. Electronically Signed   By: Lovey Newcomer M.D.   On: 07/10/2015 08:59        Discharge Instructions    Amb Referral to Cardiac Rehabilitation    Complete by:  As directed   Diagnosis:  Valve Replacement/Repair          Discharge Medications:   Medication List    STOP taking these medications        telmisartan 40 MG tablet  Commonly known as:  MICARDIS      TAKE these medications        amiodarone 400 MG tablet  Commonly known as:  PACERONE  Take 1 tablet (400 mg total) by mouth 2 (two) times daily after a meal. For one week. Then take 200 mg by mouth two times daily for 2 weeks. Finally, take 200 mg by mouth daily thereafter     aspirin 81 MG tablet  Take 81 mg by mouth daily.     ferrous gluconate 324 MG tablet  Commonly known as:  FERGON  Take 1 tablet (324 mg total) by mouth daily with breakfast. For one month then stop.     fluticasone 50 MCG/ACT nasal spray  Commonly known as:  FLONASE  INHALE 2 SPRAYS INTO EACH NOSTRIL EVERY DAY.     metoprolol 50 MG tablet  Commonly known as:  LOPRESSOR  Take 1 tablet (50 mg total) by mouth 2 (two) times daily.     MULTIVITAMIN ADULT PO  Take 1 tablet by mouth every other day.     oxyCODONE 5 MG immediate release tablet  Commonly known as:  Oxy IR/ROXICODONE  Take 1 tablet (5 mg total) by mouth every 4 (four) hours as needed for severe pain.     sildenafil 100 MG tablet  Commonly known as:  VIAGRA  Take 0.5-1 tablets (50-100 mg total) by mouth as needed.     warfarin 5 MG tablet  Commonly known as:  COUMADIN  Take 1 tablet (5 mg total) by mouth daily at 6 PM. Or as directed       The patient has been discharged on:   1.Beta Blocker:  Yes [ x  ]                              No   [   ]                              If No, reason:  2.Ace Inhibitor/ARB: Yes [   ]                                     No  [    ]                                     If No, reason:  3.Statin:   Yes [   ]                  No  [ x  ]                  If No, reason:No CAD, hyperlipidemia  4.Shela Commons:  Yes  [  x  ]                  No   [   ]                  If No, reason:  Follow Up Appointments: Follow-up Information    Follow up with Sinclair Grooms, MD.   Specialty:  Cardiology   Why:  Call for a 2 week follow up appointment   Contact information:   [id number] [place] [address] [phone]       Follow up with [name], MD On 08/10/2015.   Specialty:  Cardiothoracic Surgery   Why:  PA/LAT CXR to be taken (at [facility] which is in the same building as Dr. Vivi Martens office) on 08/10/2015 at 9:45 am;Appointment time is at 10:30 am   Contact information:   [address] [place] [address] [phone]       Follow up with Nurse On 07/19/2015.   Why:  Appointment is for chest tube suture removal only. Appointment time is at 10:00 am   Contact information:   [place] [address] [phone]      Follow up with Sinclair Grooms, MD.   Specialty:  Cardiology   Why:  Call to have the PT and INR (  as is on Coumadin and goal 2-2.5) checked on Monday 07/18/2015.   Contact information:   [id number] [place] [address] [phone]       Signed: Lars Pinks MPA-C 07/15/2015, 8:18 AM

## 2015-07-12 NOTE — Progress Notes (Signed)
Removed epicardial wires per order. 4 intact.  Pt tolerated procedure well.  Pt instructed to remain on bedrest for one hour.  Frequent vitals will be taken. Pt resting with call bell within reach. Payton Emerald, RN

## 2015-07-12 NOTE — Discharge Instructions (Signed)
Activity: 1.May walk up steps                2.No lifting more than ten pounds for four weeks.                 3.No driving for four weeks.                4.Stop any activity that causes chest pain, shortness of breath, dizziness,  sweating or excessive weakness.                5.Avoid straining.                6.Continue with your breathing exercises daily.  Diet:  Low fat, Low salt  diet  Wound Care: May shower.  Clean wounds with mild soap and water daily. Contact the office at [phone] if any problems arise.  Aortic Valve Replacement, Care After Refer to this sheet in the next few weeks. These instructions provide you with information on caring for yourself after your procedure. Your health care provider may also give you specific instructions. Your treatment has been planned according to current medical practices, but problems sometimes occur. Call your health care provider if you have any problems or questions after your procedure. HOME CARE INSTRUCTIONS   Take medicines only as directed by your health care provider.  If your health care provider has prescribed elastic stockings, wear them as directed.  Take frequent naps or rest often throughout the day.  Avoid lifting over 10 lbs (4.5 kg) or pushing or pulling things with your arms for 6-8 weeks or as directed by your health care provider.  Avoid driving or airplane travel for 4-6 weeks after surgery or as directed by your health care provider. If you are riding in a car for an extended period, stop every 1-2 hours to stretch your legs. Keep a record of your medicines and medical history with you when traveling.  Do not drive or operate heavy machinery while taking pain medicine. (narcotics).  Do not cross your legs.  Do not use any tobacco products including cigarettes, chewing tobacco, or electronic cigarettes. If you need help quitting, ask your health care provider.  Do not take baths, swim, or use a hot tub until your  health care provider approves. Take showers once your health care provider approves. Pat incisions dry. Do not rub incisions with a washcloth or towel.  Avoid climbing stairs and using the handrail to pull yourself up for the first 2-3 weeks after surgery.  Return to work as directed by your health care provider.  Drink enough fluid to keep your urine clear or pale yellow.  Do not strain to have a bowel movement. Eat high-fiber foods if you become constipated. You may also take a medicine to help you have a bowel movement (laxative) as directed by your health care provider.  Resume sexual activity as directed by your health care provider. Men should not use medicines for erectile dysfunction until their doctor says it isokay.  If you had a certain type of heart condition in the past, you may need to take antibiotic medicine before having dental work or surgery. Let your dentist and health care providers know if you had one or more of the following:  Previous endocarditis.  An artificial (prosthetic) heart valve.  Congenital heart disease. SEEK MEDICAL CARE IF:  You develop a skin rash.   You experience sudden changes in your weight.  You have a fever.  SEEK IMMEDIATE MEDICAL CARE IF:   You develop chest pain that is not coming from your incision.  You have drainage (pus), redness, swelling, or pain at your incision site.   You develop shortness of breath or have difficulty breathing.   You have increased bleeding from your incision site.   You develop light-headedness.  MAKE SURE YOU:   Understand these directions.  Will watch your condition.  Will get help right away if you are not doing well or get worse.   This information is not intended to replace advice given to you by your health care provider. Make sure you discuss any questions you have with your health care provider.   Document Released: 01/04/2005 Document Revised: 07/09/2014 Document Reviewed:  04/01/2012 Elsevier Interactive Patient Education 2016 [place] on my medicine - Coumadin   (Warfarin)  This medication education was reviewed with me or my healthcare representative as part of my discharge preparation.    Why was Coumadin prescribed for you? Coumadin was prescribed for you because you have a blood clot or a medical condition that can cause an increased risk of forming blood clots. Blood clots can cause serious health problems by blocking the flow of blood to the heart, lung, or brain. Coumadin can prevent harmful blood clots from forming. As a reminder your indication for Coumadin is:   Stroke Prevention Because Of Atrial Fibrillation  What test will check on my response to Coumadin? While on Coumadin (warfarin) you will need to have an INR test regularly to ensure that your dose is keeping you in the desired range. The INR (international normalized ratio) number is calculated from the result of the laboratory test called prothrombin time (PT).  If an INR APPOINTMENT HAS NOT ALREADY BEEN MADE FOR YOU please schedule an appointment to have this lab work done by your health care provider within 7 days. Your INR goal is usually a number between:  2 to 3 or your provider may give you a more narrow range like 2-2.5.  Ask your health care provider during an office visit what your goal INR is.  What  do you need to  know  About  COUMADIN? Take Coumadin (warfarin) exactly as prescribed by your healthcare provider about the same time each day.  DO NOT stop taking without talking to the doctor who prescribed the medication.  Stopping without other blood clot prevention medication to take the place of Coumadin may increase your risk of developing a new clot or stroke.  Get refills before you run out.  What do you do if you miss a dose? If you miss a dose, take it as soon as you remember on the same day then continue your regularly scheduled regimen the next day.  Do  not take two doses of Coumadin at the same time.  Important Safety Information A possible side effect of Coumadin (Warfarin) is an increased risk of bleeding. You should call your healthcare provider right away if you experience any of the following: ? Bleeding from an injury or your nose that does not stop. ? Unusual colored urine (red or dark brown) or unusual colored stools (red or black). ? Unusual bruising for unknown reasons. ? A serious fall or if you hit your head (even if there is no bleeding).  Some foods or medicines interact with Coumadin (warfarin) and might alter your response to warfarin. To help avoid this: ? Eat a balanced diet, maintaining a consistent amount of Vitamin K. ?  Notify your provider about major diet changes you plan to make. ? Avoid alcohol or limit your intake to 1 drink for women and 2 drinks for men per day. (1 drink is 5 oz. wine, 12 oz. beer, or 1.5 oz. liquor.)  Make sure that ANY health care provider who prescribes medication for you knows that you are taking Coumadin (warfarin).  Also make sure the healthcare provider who is monitoring your Coumadin knows when you have started a new medication including herbals and non-prescription products.  Coumadin (Warfarin)  Major Drug Interactions  Increased Warfarin Effect Decreased Warfarin Effect  Alcohol (large quantities) Antibiotics (esp. Septra/Bactrim, Flagyl, Cipro) Amiodarone (Cordarone) Aspirin (ASA) Cimetidine (Tagamet) Megestrol (Megace) NSAIDs (ibuprofen, naproxen, etc.) Piroxicam (Feldene) Propafenone (Rythmol SR) Propranolol (Inderal) Isoniazid (INH) Posaconazole (Noxafil) Barbiturates (Phenobarbital) Carbamazepine (Tegretol) Chlordiazepoxide (Librium) Cholestyramine (Questran) Griseofulvin Oral Contraceptives Rifampin Sucralfate (Carafate) Vitamin K   Coumadin (Warfarin) Major Herbal Interactions  Increased Warfarin Effect Decreased Warfarin Effect  Garlic Ginseng Ginkgo  biloba Coenzyme Q10 Green tea St. Johns wort    Coumadin (Warfarin) FOOD Interactions  Eat a consistent number of servings per week of foods HIGH in Vitamin K (1 serving =  cup)  Collards (cooked, or boiled & drained) Kale (cooked, or boiled & drained) Mustard greens (cooked, or boiled & drained) Parsley *serving size only =  cup Spinach (cooked, or boiled & drained) Swiss chard (cooked, or boiled & drained) Turnip greens (cooked, or boiled & drained)  Eat a consistent number of servings per week of foods MEDIUM-HIGH in Vitamin K (1 serving = 1 cup)  Asparagus (cooked, or boiled & drained) Broccoli (cooked, boiled & drained, or raw & chopped) Brussel sprouts (cooked, or boiled & drained) *serving size only =  cup Lettuce, raw (green leaf, endive, romaine) Spinach, raw Turnip greens, raw & chopped   These websites have more information on Coumadin (warfarin):  FailFactory.se; VeganReport.com.au;

## 2015-07-13 LAB — PROTIME-INR
INR: 1.35 (ref 0.00–1.49)
PROTHROMBIN TIME: 16.8 s — AB (ref 11.6–15.2)

## 2015-07-13 LAB — BASIC METABOLIC PANEL
Anion gap: 7 (ref 5–15)
BUN: 20 mg/dL (ref 6–20)
CALCIUM: 8.6 mg/dL — AB (ref 8.9–10.3)
CO2: 26 mmol/L (ref 22–32)
CREATININE: 1.33 mg/dL — AB (ref 0.61–1.24)
Chloride: 105 mmol/L (ref 101–111)
GFR, EST NON AFRICAN AMERICAN: 56 mL/min — AB (ref >60)
Glucose, Bld: 100 mg/dL — ABNORMAL HIGH (ref 65–99)
Potassium: 4.7 mmol/L (ref 3.5–5.1)
SODIUM: 138 mmol/L (ref 135–145)

## 2015-07-13 MED FILL — Dexmedetomidine HCl in NaCl 0.9% IV Soln 400 MCG/100ML: INTRAVENOUS | Qty: 100 | Status: AC

## 2015-07-13 NOTE — Progress Notes (Signed)
CARDIAC REHAB PHASE I   PRE:  Rate/Rhythm: 101 afib    BP: sitting 120/70    SaO2: 96 RA  MODE:  Ambulation: 1100 ft   POST:  Rate/Rhythm: 133 afib highest    BP: sitting 122/80     SaO2: 98 RA  Pt moving very well without assist. HR mostly 120s walking. He sts he can feel it, feels SOB at times. Rest x2 at my prompting for long walk. Discussed CRPII and will send referral to [place]. Pt can walk independently. [id number]   Aaron Cole [place] CES, ACSM 07/13/2015 9:49 AM

## 2015-07-13 NOTE — Progress Notes (Addendum)
      [address]       [place] [address]             [phone]        8 Days Post-Op Procedure(s) (LRB): BENTALL PROCEDURE UTILIZING A 28 X 10 MM WOVEN DOUBLE VELOUR VASCULAR GRAFT AND A 28MM GELWEAVE WOVEN VASCULAR GRAFT. 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 (N/A) TRANSESOPHAGEAL ECHOCARDIOGRAM (TEE) (N/A)  Subjective: Patient states he no longer has confusion. He hopes to go home soon  Objective: Vital signs in last 24 hours: Temp:  [97.4 F (36.3 C)-98.6 F (37 C)] 98.6 F (37 C) (01/11 0509) Pulse Rate:  [67-74] 70 (01/11 0509) Cardiac Rhythm:  [-] Atrial fibrillation (01/11 0658) Resp:  [18] 18 (01/11 0509) BP: (113-134)/(73-85) 134/84 mmHg (01/11 0509) SpO2:  [98 %-99 %] 98 % (01/11 0509) Weight:  [216 lb 14.9 oz (98.4 kg)] 216 lb 14.9 oz (98.4 kg) (01/11 0509)  Pre op weight 100 kg Current Weight  07/13/15 216 lb 14.9 oz (98.4 kg)      Intake/Output from previous day: 01/10 0701 - 01/11 0700 In: 680 [P.O.:680] Out: 600 [Urine:600]   Physical Exam:  Cardiovascular: IRRR IRRR Pulmonary: Clear to auscultation bilaterally; no rales, wheezes, or rhonchi. Abdomen: Soft, non tender, bowel sounds present. Extremities:No lower extremity edema. Wounds: Clean and dry.  No erythema or signs of infection. Neurologic: NO focal deficits  Lab Results: CBC:  Recent Labs  07/12/15 0219  WBC 8.8  HGB 8.0*  HCT 24.8*  PLT 177   BMET:   Recent Labs  07/12/15 0219 07/13/15 0229  NA 138 138  K 4.7 4.7  CL 105 105  CO2 26 26  GLUCOSE 111* 100*  BUN 20 20  CREATININE 1.38* 1.33*  CALCIUM 8.6* 8.6*    PT/INR:  Lab Results  Component Value Date   INR 1.35 07/13/2015   INR 1.20 07/12/2015   INR 1.23 07/11/2015   ABG:  INR: Will add last result for INR, ABG once components are confirmed Will add last 4 CBG results once components are confirmed  Assessment/Plan:  1. CV - Previous A fib  with RVR. He is in a fib with HR in the 90's-low 100's..On Amiodarone 400 mg bid, Lopressor 50 mg bid, Coumadin. INR slightly increased to 1.35. Will continue with Coumadin 5 mg daily.  2.  Pulmonary - On room air. Encourage incentive spirometer 3.  Acute blood loss anemia - H and H stable 8 and 24.8. Continue Fergon. 4. Creatinine stable at 1.33. Lasix stopped yesterday as below pre op weight 5. Hope to discharge soon, as long as INR continues to increase  ZIMMERMAN,DONIELLE MPA-C 07/13/2015,7:40 AM     Chart reviewed, patient examined, agree with above. He is doing well overall but still having some rate-controlled atrial fib on amiodarone and lopressor. INR starting to bump. With a prosthetic aortic valve in place he needs an INR around 2 before he goes home.

## 2015-07-14 LAB — PROTIME-INR
INR: 1.97 — AB (ref 0.00–1.49)
INR: 1.44 (ref 0.00–1.49)
PROTHROMBIN TIME: 22.3 s — AB (ref 11.6–15.2)
Prothrombin Time: 17.6 seconds — ABNORMAL HIGH (ref 11.6–15.2)

## 2015-07-14 MED ORDER — WARFARIN SODIUM 7.5 MG PO TABS
7.5000 mg | ORAL_TABLET | Freq: Every day | ORAL | Status: DC
  Administered 2015-07-14: 7.5 mg via ORAL
  Filled 2015-07-14: qty 1

## 2015-07-14 NOTE — Care Management Note (Signed)
Case Management Note  Patient Details  Name: Aaron Cole MRN: [id number] Date of Birth: [birth year]  Subjective/Objective:    Pt admitted with aortic aneurysm                Action/Plan:  Pt is independent from home with aunt.  CM will continue to monitor for disposition needs   Expected Discharge Date:                  Expected Discharge Plan:  Home/Self Care  In-House Referral:     Discharge planning Services  CM Consult  Post Acute Care Choice:    Choice offered to:     DME Arranged:    DME Agency:     HH Arranged:    HH Agency:     Status of Service:  In process, will continue to follow  Medicare Important Message Given:    Date Medicare IM Given:    Medicare IM give by:    Date Additional Medicare IM Given:    Additional Medicare Important Message give by:     If discussed at [place] of Stay Meetings, dates discussed:    Additional Comments:  Maryclare Labrador, RN 07/14/2015, 10:33 AM

## 2015-07-14 NOTE — Progress Notes (Signed)
INR repeated this am and is NOT 1.97 but is 1.44. Will increase Coumadin dose. He will be able to be discharged once INR closer to 2.

## 2015-07-14 NOTE — Progress Notes (Signed)
CARDIAC REHAB PHASE I   PRE:  Rate/Rhythm: 63 SR    BP: sitting 100/60 left arm, 110/70 right arm    SaO2: 95 RA  MODE:  Ambulation: 350 ft   POST:  Rate/Rhythm: 74 SR    BP: sitting 150/76     SaO2: 98 RA  Came around 1200 to walk with pt. He had just gotten dressed after shower and had felt acutely SOB. BP 90/60. Wanted to rest. Sternum with drainage, applied dressing.  Returned and pt had rested, BP still slightly lower than his norm. Pt acutely SOB at beginning of the walk. Needed to rest. Stated he felt better but kept walk shorter. Maintained SR, BP elevated on return to room. Pt seems paler today. Encouraged more walking with communication with his RN. Will f/u tomorrow. [id number]  Josephina Shih Jackpot CES, ACSM 07/14/2015 2:23 PM

## 2015-07-14 NOTE — Progress Notes (Addendum)
      [address]       [place] [address]             [phone]        9 Days Post-Op Procedure(s) (LRB): BENTALL PROCEDURE UTILIZING A 28 X 10 MM WOVEN DOUBLE VELOUR VASCULAR GRAFT AND A 28MM GELWEAVE WOVEN VASCULAR GRAFT. 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 (N/A) TRANSESOPHAGEAL ECHOCARDIOGRAM (TEE) (N/A)  Subjective: Patient states he does not believe his blood was drawn this am  Objective: Vital signs in last 24 hours: Temp:  [98.1 F (36.7 C)-98.6 F (37 C)] 98.6 F (37 C) (01/12 0453) Pulse Rate:  [70-79] 70 (01/12 0453) Cardiac Rhythm:  [-] Normal sinus rhythm (01/11 1901) Resp:  [18] 18 (01/12 0453) BP: (103-133)/(66-76) 133/76 mmHg (01/12 0453) SpO2:  [98 %] 98 % (01/12 0453) Weight:  [213 lb 13.5 oz (97 kg)] 213 lb 13.5 oz (97 kg) (01/12 0453)  Pre op weight 100 kg Current Weight  07/14/15 213 lb 13.5 oz (97 kg)      Intake/Output from previous day: 01/11 0701 - 01/12 0700 In: 960 [P.O.:960] Out: -    Physical Exam:  Cardiovascular: RRR Pulmonary: Clear to auscultation bilaterally; no rales, wheezes, or rhonchi. Abdomen: Soft, non tender, bowel sounds present. Extremities:No lower extremity edema. Wounds: Clean and dry.  No erythema or signs of infection. Neurologic: NO focal deficits  Lab Results: CBC:  Recent Labs  07/12/15 0219  WBC 8.8  HGB 8.0*  HCT 24.8*  PLT 177   BMET:   Recent Labs  07/12/15 0219 07/13/15 0229  NA 138 138  K 4.7 4.7  CL 105 105  CO2 26 26  GLUCOSE 111* 100*  BUN 20 20  CREATININE 1.38* 1.33*  CALCIUM 8.6* 8.6*    PT/INR:  Lab Results  Component Value Date   INR 1.97* 07/14/2015   INR 1.35 07/13/2015   INR 1.20 07/12/2015   ABG:  INR: Will add last result for INR, ABG once components are confirmed Will add last 4 CBG results once components are confirmed  Assessment/Plan:  1. CV - Previous A fib with RVR. He is back in SR in the  70's.On Amiodarone 400 mg bid, Lopressor 50 mg bid, Coumadin. INR increased to 1.97. I am going to recheck INR 2.  Pulmonary - On room air. Encourage incentive spirometer 3.  Acute blood loss anemia - H and H stable 8 and 24.8. Continue Fergon. 4. Hope to discharge when INR closer 2  ZIMMERMAN,DONIELLE MPA-C 07/14/2015,7:30 AM     Chart reviewed, patient examined, agree with above. He feels well and wants to get home. Took a shower today. Repeat INR was only 1.44 so I think the earlier result was someone else's specimen. Will give 7.5 mg Coumadin  tonight. Probably home tomorrow with INR follow up Monday.

## 2015-07-15 LAB — PROTIME-INR
INR: 1.69 — ABNORMAL HIGH (ref 0.00–1.49)
Prothrombin Time: 19.9 seconds — ABNORMAL HIGH (ref 11.6–15.2)

## 2015-07-15 MED ORDER — WARFARIN SODIUM 5 MG PO TABS
5.0000 mg | ORAL_TABLET | Freq: Every day | ORAL | Status: DC
Start: 2015-07-15 — End: 2015-10-06

## 2015-07-15 MED ORDER — WARFARIN SODIUM 5 MG PO TABS: 5.0000 mg | ORAL_TABLET | Freq: Every day | ORAL | Status: DC

## 2015-07-15 MED ORDER — METOPROLOL TARTRATE 50 MG PO TABS: 50.0000 mg | ORAL_TABLET | Freq: Two times a day (BID) | ORAL | Status: DC

## 2015-07-15 MED ORDER — OXYCODONE HCL 5 MG PO TABS: 5.0000 mg | ORAL_TABLET | ORAL | Status: DC | PRN

## 2015-07-15 MED ORDER — FERROUS GLUCONATE 324 (38 FE) MG PO TABS: 324.0000 mg | ORAL_TABLET | Freq: Every day | ORAL | Status: DC

## 2015-07-15 MED ORDER — AMIODARONE HCL 400 MG PO TABS: 400.0000 mg | ORAL_TABLET | Freq: Two times a day (BID) | ORAL | Status: DC

## 2015-07-15 NOTE — Progress Notes (Signed)
      [address]       [place] [address]             [phone]        10 Days Post-Op Procedure(s) (LRB): BENTALL PROCEDURE UTILIZING A 28 X 10 MM WOVEN DOUBLE VELOUR VASCULAR GRAFT AND A 28MM GELWEAVE WOVEN VASCULAR GRAFT. 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 (N/A) TRANSESOPHAGEAL ECHOCARDIOGRAM (TEE) (N/A)  Subjective: Patient anxiously awaiting discharge-no complaints  Objective: Vital signs in last 24 hours: Temp:  [98.1 F (36.7 C)-98.2 F (36.8 C)] 98.1 F (36.7 C) (01/13 0346) Pulse Rate:  [63-80] 70 (01/13 0346) Cardiac Rhythm:  [-] Atrial fibrillation (01/13 0413) Resp:  [18-20] 20 (01/13 0346) BP: (100-128)/(48-105) 110/48 mmHg (01/13 0346) SpO2:  [98 %-100 %] 98 % (01/13 0346) Weight:  [211 lb 6.7 oz (95.9 kg)] 211 lb 6.7 oz (95.9 kg) (01/13 0413)  Pre op weight 100 kg Current Weight  07/15/15 211 lb 6.7 oz (95.9 kg)      Intake/Output from previous day: 01/12 0701 - 01/13 0700 In: 720 [P.O.:720] Out: -    Physical Exam:  Cardiovascular: [facility] Pulmonary: Clear to auscultation bilaterally; no rales, wheezes, or rhonchi. Abdomen: Soft, non tender, bowel sounds present. Extremities:No lower extremity edema. Wounds: Clean and dry.  No erythema or signs of infection.   Lab Results: CBC: No results for input(s): WBC, HGB, HCT, PLT in the last 72 hours. BMET:   Recent Labs  07/13/15 0229  NA 138  K 4.7  CL 105  CO2 26  GLUCOSE 100*  BUN 20  CREATININE 1.33*  CALCIUM 8.6*    PT/INR:  Lab Results  Component Value Date   INR 1.69* 07/15/2015   INR 1.44 07/14/2015   INR 1.97* 07/14/2015   ABG:  INR: Will add last result for INR, ABG once components are confirmed Will add last 4 CBG results once components are confirmed  Assessment/Plan:  1. CV - Previous A fib with RVR. He is back in SR in the 70's.On Amiodarone 400 mg bid, Lopressor 50 mg bid, Coumadin. INR increased  to 1.69. Will give Coumadin 5 mg tonight 2.  Pulmonary - On room air. Encourage incentive spirometer 3.  Acute blood loss anemia - H and H stable 8 and 24.8. Continue Fergon. 4. Hope to discharge when INR closer 2  Milee Qualls MPA-C 07/15/2015,7:34 AM     Chart reviewed, patient examined, agree with above. He feels well and wants to get home. Took a shower today. Repeat INR was only 1.44 so I think the earlier result was someone else's specimen. Will give 7.5 mg Coumadin  tonight. Probably home tomorrow with INR follow up Monday.

## 2015-07-15 NOTE — Progress Notes (Signed)
CARDIAC REHAB PHASE I   Pt eagerly awaiting discharge. Up ad lib in room. Cardiac surgery discharge education completed with pt and pt's aunt at bedside. Reviewed IS, sternal precautions, activity progression, exercise, risk factors, heart healthy/renal/vitamin k and coumadin diet, daily weights, sodium restrictions, off the beat book and phase 2 cardiac rehab. Pt and aunt verbalized understanding. Phase 2 referral sent to [facility] per pt request. Pt up in room, getting dressed, awaiting discharge.    [id number] Lenna Sciara, RN, BSN 07/15/2015 9:30 AM

## 2015-07-15 NOTE — Care Management Note (Signed)
Case Management Note  Patient Details  Name: RAMOND BOLEYN MRN: [id number] Date of Birth: 16-Jul-1950  Subjective/Objective:    Pt admitted with aortic aneurysm                Action/Plan:  Pt is independent from home with aunt.  CM will continue to monitor for disposition needs   Expected Discharge Date:                  Expected Discharge Plan:  Home/Self Care  In-House Referral:     Discharge planning Services  CM Consult  Post Acute Care Choice:    Choice offered to:     DME Arranged:    DME Agency:     HH Arranged:    [place] Agency:     Status of Service:  Completed, signed off  Medicare Important Message Given:    Date Medicare IM Given:    Medicare IM give by:    Date Additional Medicare IM Given:    Additional Medicare Important Message give by:     If discussed at [place] of Stay Meetings, dates discussed:    Additional Comments: Pt will discharge home today  07/15/2015 without CM needs Maryclare Labrador, RN 07/15/2015, 8:37 AM

## 2015-07-15 NOTE — Progress Notes (Signed)
Central tele. Called to let me know Patient is going in and out of At. Fib rate 80-100 Patient  Sleeping.

## 2015-07-18 ENCOUNTER — Ambulatory Visit (INDEPENDENT_AMBULATORY_CARE_PROVIDER_SITE_OTHER): Payer: BLUE CROSS/BLUE SHIELD | Admitting: *Deleted

## 2015-07-18 DIAGNOSIS — I4891 Unspecified atrial fibrillation: Secondary | ICD-10-CM | POA: Insufficient documentation

## 2015-07-18 DIAGNOSIS — Z5181 Encounter for therapeutic drug level monitoring: Secondary | ICD-10-CM | POA: Insufficient documentation

## 2015-07-18 LAB — POCT INR: INR: 3.1

## 2015-07-18 NOTE — Patient Instructions (Signed)

## 2015-07-19 ENCOUNTER — Ambulatory Visit (INDEPENDENT_AMBULATORY_CARE_PROVIDER_SITE_OTHER): Payer: Self-pay | Admitting: *Deleted

## 2015-07-19 DIAGNOSIS — I7121 Aneurysm of the ascending aorta, without rupture: Secondary | ICD-10-CM

## 2015-07-19 DIAGNOSIS — Z952 Presence of prosthetic heart valve: Secondary | ICD-10-CM

## 2015-07-19 DIAGNOSIS — Z9889 Other specified postprocedural states: Secondary | ICD-10-CM

## 2015-07-19 DIAGNOSIS — I712 Thoracic aortic aneurysm, without rupture: Secondary | ICD-10-CM

## 2015-07-19 DIAGNOSIS — Q231 Congenital insufficiency of aortic valve: Secondary | ICD-10-CM

## 2015-07-19 DIAGNOSIS — Z8679 Personal history of other diseases of the circulatory system: Secondary | ICD-10-CM

## 2015-07-19 DIAGNOSIS — Z4802 Encounter for removal of sutures: Secondary | ICD-10-CM

## 2015-07-19 NOTE — Progress Notes (Signed)
Mr. Bruington returns s/p BENTALL/R@G  TAA/AVR for suture removal of four previous chest tube site sutures.  These are very well healed and easily removed. The sternal incision is also very well healed. His main complaint is nausea and some diarrhea which he attributes to the Amiodarone.  I offered nausea med, but he said the dose is ready to be decreased and he would see how he did.  Otherwise, he said he would discuss with the cardiologist next week.  We will see him as scheduled with a chest xray.

## 2015-07-26 ENCOUNTER — Ambulatory Visit (INDEPENDENT_AMBULATORY_CARE_PROVIDER_SITE_OTHER): Payer: BLUE CROSS/BLUE SHIELD | Admitting: *Deleted

## 2015-07-26 ENCOUNTER — Ambulatory Visit (INDEPENDENT_AMBULATORY_CARE_PROVIDER_SITE_OTHER): Payer: BLUE CROSS/BLUE SHIELD | Admitting: Physician Assistant

## 2015-07-26 ENCOUNTER — Encounter: Payer: Self-pay | Admitting: Physician Assistant

## 2015-07-26 VITALS — BP 120/81 | HR 64 | Ht 71.0 in | Wt 212.8 lb

## 2015-07-26 DIAGNOSIS — N183 Chronic kidney disease, stage 3 unspecified: Secondary | ICD-10-CM

## 2015-07-26 DIAGNOSIS — Z954 Presence of other heart-valve replacement: Secondary | ICD-10-CM

## 2015-07-26 DIAGNOSIS — Z9889 Other specified postprocedural states: Secondary | ICD-10-CM

## 2015-07-26 DIAGNOSIS — Z5181 Encounter for therapeutic drug level monitoring: Secondary | ICD-10-CM | POA: Diagnosis not present

## 2015-07-26 DIAGNOSIS — I10 Essential (primary) hypertension: Secondary | ICD-10-CM

## 2015-07-26 DIAGNOSIS — I4891 Unspecified atrial fibrillation: Secondary | ICD-10-CM | POA: Diagnosis not present

## 2015-07-26 DIAGNOSIS — Z8679 Personal history of other diseases of the circulatory system: Secondary | ICD-10-CM | POA: Insufficient documentation

## 2015-07-26 DIAGNOSIS — R110 Nausea: Secondary | ICD-10-CM

## 2015-07-26 DIAGNOSIS — Z952 Presence of prosthetic heart valve: Secondary | ICD-10-CM | POA: Insufficient documentation

## 2015-07-26 LAB — PROTIME-INR
INR: 4.34 — AB (ref 0.00–1.49)
Prothrombin Time: 40.4 seconds — ABNORMAL HIGH (ref 11.6–15.2)

## 2015-07-26 LAB — BASIC METABOLIC PANEL
BUN: 16 mg/dL (ref 7–25)
CHLORIDE: 106 mmol/L (ref 98–110)
CO2: 25 mmol/L (ref 20–31)
Calcium: 9.1 mg/dL (ref 8.6–10.3)
Creat: 1.55 mg/dL — ABNORMAL HIGH (ref 0.70–1.25)
Glucose, Bld: 99 mg/dL (ref 65–99)
POTASSIUM: 5.2 mmol/L (ref 3.5–5.3)
SODIUM: 138 mmol/L (ref 135–146)

## 2015-07-26 LAB — POCT INR: INR: 6

## 2015-07-26 NOTE — Assessment & Plan Note (Signed)
Followup renal function today. 

## 2015-07-26 NOTE — Patient Instructions (Addendum)
Medication Instructions:  Your physician has recommended you make the following change in your medication:  1.  DECREASE the Amiodarone to 200 mg taking 1 tablet daily    Labwork: TODAY:  BMET & CBC W/DIFF  Testing/Procedures: None ordered  Follow-Up: Your physician recommends that you schedule a follow-up appointment in: 2 [place]   Any Other Special Instructions Will Be Listed Below (If Applicable).   If you need a refill on your cardiac medications before your next appointment, please call your pharmacy.

## 2015-07-26 NOTE — Progress Notes (Signed)
Cardiology Office Note   Date:  07/26/2015   ID:  Aaron Cole, DOB January 16, 1951, MRN [id number]  PCP:  [name], MD  Cardiologist:  Dr. Daneen Schick   Chief Complaint:headache and nausea    History of Present Illness: Aaron Cole is a 65 y.o. male who presents for post hospital follow-up. He has a history of hypertension and renal cell carcinoma s/p right nephrectomy who is followed at [facility] by Dr. Celso Sickle. He was seen there for his routine follow up in October and a CXR reportedly showed a dilated ascending aorta. He underwent an echo on 06/02/2015 in [place] which showed a dilated ascending aorta at 52 mm diameter with the distal ascending aorta being 40 mm and the proximal arch 38 mm. The aortic root was 46 mm. The images of the aortic valve suggested a bicuspid valve with mildly thickened leaflets and mild AI. The LVEF was normal. Cath showed normal coronary arteries.  Patient underwent resecting and grafting of an ascending aorta aneurysm using a 28 mm Hemashield graft and a Bentall procedure using a 25 mm Edwards magna [place] pericardial valve by Dr. Cyndia Bent 07/05/15. Postop he had atrial fibrillation with RVR and was started on amiodarone. He also had some confusion postop that resolved.  Patient comes in today for post hospital follow-up. He complains of nausea and headache about an hour and a half after taking the amiodarone with food in the morning. He was sent home on a loading dose and is currently taking 200 mg twice a day. 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 His INR was greater than 6 today in the Coumadin clinic and he is having a PT drawn as well.   Past Medical History  Diagnosis Date  . Sinus headache     recurrent  . HTN (hypertension)   . S/p nephrectomy 2013    [facility]  . CAD (coronary artery disease) 2013    per CT scan  - brings CD of CT, no report - will need [facility] records of CT scan report  . Diverticulosis     by CT scan  .  Renal cell carcinoma ([place]) 2013    clear cell s/p R nephrectomy  . CKD (chronic kidney disease) stage 3, GFR 30-59 ml/min 2015    iatrogenic (decreased nephron mass) established with Dr. Alfonse Alpers [facility])    Past Surgical History  Procedure Laterality Date  . Nasal sinus surgery  1978    [facility]us surgery done at [facility]  . Rectal fistula repair  1980s  . Nephrectomy Right 12/2011    [facility], R for RCC  . Colonoscopy  2011    per pt normal  . Bentall procedure N/A 07/05/2015    Procedure: BENTALL PROCEDURE UTILIZING A 28 X 10 MM WOVEN DOUBLE VELOUR VASCULAR GRAFT AND A 28MM GELWEAVE WOVEN VASCULAR GRAFT. 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.;  Surgeon: [name], MD;  Location: [place] OR;  Service: Open Heart Surgery;  Laterality: N/A;  . Tee without cardioversion N/A 07/05/2015    Procedure: TRANSESOPHAGEAL ECHOCARDIOGRAM (TEE);  Surgeon: [name], MD;  Location: [facility];  Service: Open Heart Surgery;  Laterality: N/A;     Current Outpatient Prescriptions  Medication Sig Dispense Refill  . amiodarone (PACERONE) 200 MG tablet Take 1 tablet by mouth daily.  0  . aspirin 81 MG tablet Take 81 mg by mouth daily.    . ferrous gluconate (FERGON) 324  MG tablet Take 1 tablet (324 mg total) by mouth daily with breakfast. For one month then stop. 30 tablet 0  . fluticasone (FLONASE) 50 MCG/ACT nasal spray INHALE 2 SPRAYS INTO EACH NOSTRIL EVERY DAY. 16 g 11  . metoprolol (LOPRESSOR) 50 MG tablet Take 1 tablet (50 mg total) by mouth 2 (two) times daily. 60 tablet 1  . Multiple Vitamins-Minerals (MULTIVITAMIN ADULT PO) Take 1 tablet by mouth every other day.    . oxyCODONE (OXY IR/ROXICODONE) 5 MG immediate release tablet Take 1 tablet (5 mg total) by mouth every 4 (four) hours as needed for severe pain. 30 tablet 0  . sildenafil (VIAGRA) 100 MG tablet Take 0.5-1 tablets (50-100 mg total) by mouth as needed. 10 tablet 3  . warfarin (COUMADIN) 5 MG tablet Take 1 tablet (5  mg total) by mouth daily at 6 PM. Or as directed 30 tablet 1   No current facility-administered medications for this visit.    Allergies:   Review of patient's allergies indicates no known allergies.    Social History:  The patient  reports that he has never smoked. He has never used smokeless tobacco. He reports that he drinks alcohol. He reports that he does not use illicit drugs.   Family History:  The patient's    family history includes Cancer in his paternal aunt, paternal aunt, and paternal grandmother; Colon polyps in his brother and father; Coronary artery disease in his maternal uncle; Diabetes in his father; Heart attack in his maternal grandfather; Heart disease in his father and mother; Hypertension in his mother. There is no history of Stroke.    ROS:  Please see the history of present illness.   Otherwise, review of systems are positive for some dyspnea on exertion and headaches, tingling on his toes.   All other systems are reviewed and negative.    PHYSICAL EXAM: VS:  BP 120/81 mmHg  Pulse 64  Ht 5\' 11"  (1.803 m)  Wt 212 lb 12.8 oz (96.525 kg)  BMI 29.69 kg/m2 , BMI Body mass index is 29.69 kg/(m^2). GEN: Well nourished, well developed, in no acute distress Neck: no JVD, HJR, carotid bruits, or masses Cardiac:  RRR; no murmurs,gallop, rubs, thrill or heave,  Respiratory:  clear to auscultation bilaterally, normal work of breathing GI: soft, nontender, nondistended, + BS MS: no deformity or atrophy Extremities: without cyanosis, clubbing, edema, good distal pulses bilaterally.  Skin: warm and dry, no rash Neuro:  Strength and sensation are intact    EKG:  EKG is ordered today. The ekg ordered today demonstrates sinus bradycardia 54 bpm with nonspecific intraventricular block   Recent Labs: 06/29/2015: ALT 32 07/06/2015: Magnesium 2.6* 07/12/2015: Hemoglobin 8.0*; Platelets 177 07/13/2015: BUN 20; Creatinine, Ser 1.33*; Potassium 4.7; Sodium 138    Lipid  Panel    Component Value Date/Time   CHOL 185 12/15/2014 0745   TRIG 157.0* 12/15/2014 0745   TRIG 131 06/21/2011   HDL 33.30* 12/15/2014 0745   CHOLHDL 6 12/15/2014 0745   VLDL 31.4 12/15/2014 0745   LDLCALC 120* 12/15/2014 0745   LDLDIRECT 118 06/21/2011      Wt Readings from Last 3 Encounters:  07/26/15 212 lb 12.8 oz (96.525 kg)  07/15/15 211 lb 6.7 oz (95.9 kg)  06/23/15 221 lb (100.245 kg)      Other studies Reviewed: Additional studies/ records that were reviewed today include and review of the records demonstrates: Conclusion      Widely patent/normal coronary arteries. Left dominant  coronary system.  Normal pulmonary artery pressures.  Unable cross the aortic valve due to aorto innominate artery tortuosity and loss of catheter torque control.        ASSESSMENT AND PLAN: S/P ascending aortic aneurysm repair Patient underwent successful repair of ascending aortic aneurysm 07/05/15  S/P aortic valve replacement Patient underwent aortic valve replacement with Edwards magna pericardial valve 07/05/15. He is on Coumadin and his INR is greater than 6. We are drawing a PT today. CBC and bmet.  CKD (chronic kidney disease) stage 3, GFR 30-59 ml/min Follow-up renal function today.  HTN (hypertension) Blood Pressure stable  Nausea Patient is having nausea and headaches about an hour and a half after taking his amiodarone. I discussed this patient with Dr. Tamala Julian who recommends decreasing amiodarone to 200 mg once daily. Follow-up with Dr. Tamala Julian in 2 months.     Sumner Boast, PA-C  07/26/2015 12:47 PM    [facility] [place], [place], [place]  [address] Phone: [phone]; Fax: [phone]

## 2015-07-26 NOTE — Assessment & Plan Note (Signed)
Patient underwent aortic valve replacement with Edwards magna pericardial valve 07/05/15. He is on Coumadin and his INR is greater than 6. We are drawing a PT today. CBC and bmet.

## 2015-07-26 NOTE — Assessment & Plan Note (Signed)
Patient is having nausea and headaches about an hour and a half after taking his amiodarone. I discussed this patient with Dr. Tamala Julian who recommends decreasing amiodarone to 200 mg once daily. Follow-up with Dr. Tamala Julian in 2 months.

## 2015-07-26 NOTE — Assessment & Plan Note (Signed)
Blood Pressure stable 

## 2015-07-26 NOTE — Assessment & Plan Note (Signed)
Patient underwent successful repair of ascending aortic aneurysm 07/05/15

## 2015-07-27 ENCOUNTER — Telehealth: Payer: Self-pay | Admitting: *Deleted

## 2015-07-27 DIAGNOSIS — I10 Essential (primary) hypertension: Secondary | ICD-10-CM

## 2015-07-27 LAB — CBC WITH DIFFERENTIAL/PLATELET
BASOS ABS: 0 10*3/uL (ref 0.0–0.1)
Basophils Relative: 0 % (ref 0–1)
Eosinophils Absolute: 0.1 10*3/uL (ref 0.0–0.7)
Eosinophils Relative: 1 % (ref 0–5)
HCT: 31.3 % — ABNORMAL LOW (ref 39.0–52.0)
HEMOGLOBIN: 9.8 g/dL — AB (ref 13.0–17.0)
LYMPHS ABS: 1.8 10*3/uL (ref 0.7–4.0)
LYMPHS PCT: 24 % (ref 12–46)
MCH: 27.2 pg (ref 26.0–34.0)
MCHC: 31.3 g/dL (ref 30.0–36.0)
MCV: 86.9 fL (ref 78.0–100.0)
MONOS PCT: 11 % (ref 3–12)
MPV: 10 fL (ref 8.6–12.4)
Monocytes Absolute: 0.8 10*3/uL (ref 0.1–1.0)
NEUTROS ABS: 4.9 10*3/uL (ref 1.7–7.7)
NEUTROS PCT: 64 % (ref 43–77)
Platelets: 501 10*3/uL — ABNORMAL HIGH (ref 150–400)
RBC: 3.6 MIL/uL — ABNORMAL LOW (ref 4.22–5.81)
RDW: 14.8 % (ref 11.5–15.5)
WBC: 7.6 10*3/uL (ref 4.0–10.5)

## 2015-07-27 NOTE — Telephone Encounter (Signed)
Per Ermalinda Barrios, PA-C, called pt to inform him that his kidney function was still up a little and that he needed to make sure he is drinking plenty of fluids and have a repeat BMET in 2 weeks.  Pt verbalized understanding.  Order in [place].

## 2015-07-27 NOTE — Telephone Encounter (Signed)
-----   Message from Imogene Burn, PA-C sent at 07/27/2015  7:10 AM EST ----- Blood count stable, kidney function up a little. Make sure you are drinking plenty of water. Recheck bmet in 2 weeks

## 2015-08-02 ENCOUNTER — Ambulatory Visit (INDEPENDENT_AMBULATORY_CARE_PROVIDER_SITE_OTHER): Payer: BLUE CROSS/BLUE SHIELD

## 2015-08-02 DIAGNOSIS — I4891 Unspecified atrial fibrillation: Secondary | ICD-10-CM | POA: Diagnosis not present

## 2015-08-02 DIAGNOSIS — Z5181 Encounter for therapeutic drug level monitoring: Secondary | ICD-10-CM

## 2015-08-02 LAB — POCT INR: INR: 3.7

## 2015-08-09 ENCOUNTER — Other Ambulatory Visit: Payer: Self-pay | Admitting: Surgery

## 2015-08-09 DIAGNOSIS — Z952 Presence of prosthetic heart valve: Secondary | ICD-10-CM

## 2015-08-10 ENCOUNTER — Other Ambulatory Visit: Payer: BLUE CROSS/BLUE SHIELD

## 2015-08-10 ENCOUNTER — Ambulatory Visit (INDEPENDENT_AMBULATORY_CARE_PROVIDER_SITE_OTHER): Payer: BLUE CROSS/BLUE SHIELD | Admitting: *Deleted

## 2015-08-10 ENCOUNTER — Ambulatory Visit
Admission: RE | Admit: 2015-08-10 | Discharge: 2015-08-10 | Disposition: A | Discharge status: Home / Self Care | Payer: BLUE CROSS/BLUE SHIELD | Source: Ambulatory Visit | Attending: Surgery | Admitting: Surgery

## 2015-08-10 ENCOUNTER — Encounter: Payer: Self-pay | Admitting: Surgery

## 2015-08-10 ENCOUNTER — Other Ambulatory Visit (INDEPENDENT_AMBULATORY_CARE_PROVIDER_SITE_OTHER): Payer: BLUE CROSS/BLUE SHIELD

## 2015-08-10 ENCOUNTER — Ambulatory Visit (INDEPENDENT_AMBULATORY_CARE_PROVIDER_SITE_OTHER): Payer: Self-pay | Admitting: Surgery

## 2015-08-10 VITALS — BP 119/80 | HR 58 | Resp 20 | Ht 71.0 in | Wt 211.0 lb

## 2015-08-10 DIAGNOSIS — I4891 Unspecified atrial fibrillation: Secondary | ICD-10-CM | POA: Diagnosis not present

## 2015-08-10 DIAGNOSIS — Q231 Congenital insufficiency of aortic valve: Secondary | ICD-10-CM

## 2015-08-10 DIAGNOSIS — Z5181 Encounter for therapeutic drug level monitoring: Secondary | ICD-10-CM

## 2015-08-10 DIAGNOSIS — Z952 Presence of prosthetic heart valve: Secondary | ICD-10-CM

## 2015-08-10 DIAGNOSIS — I712 Thoracic aortic aneurysm, without rupture: Secondary | ICD-10-CM

## 2015-08-10 DIAGNOSIS — I10 Essential (primary) hypertension: Secondary | ICD-10-CM

## 2015-08-10 DIAGNOSIS — I7121 Aneurysm of the ascending aorta, without rupture: Secondary | ICD-10-CM

## 2015-08-10 LAB — POCT INR: INR: 2.2

## 2015-08-10 LAB — BASIC METABOLIC PANEL
BUN: 18 mg/dL (ref 7–25)
CALCIUM: 9.1 mg/dL (ref 8.6–10.3)
CO2: 21 mmol/L (ref 20–31)
CREATININE: 1.57 mg/dL — AB (ref 0.70–1.25)
Chloride: 103 mmol/L (ref 98–110)
GLUCOSE: 93 mg/dL (ref 65–99)
Potassium: 4.4 mmol/L (ref 3.5–5.3)
Sodium: 135 mmol/L (ref 135–146)

## 2015-08-10 NOTE — Progress Notes (Signed)
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 The patient's early postoperative recovery while in the hospital was notable for development of recurrent atrial fibrillation treated with amiodarone and coumadin. Since hospital discharge the patient reports that he has been feeling fairly well and is walking daily without chest pain or shortness of breath. He does have some nausea that he thinks is related to the amiodarone or lopressor. He also reports some blurry vision at times and paresthesias in his feet. He says that he was on a lopressor years ago and did not tolerate it due to side effects and was put on Micardis instead to control his hypertension.   Current Outpatient Prescriptions  Medication Sig Dispense Refill  . amiodarone (PACERONE) 200 MG tablet Take 1 tablet by mouth daily.  0  . aspirin 81 MG tablet Take 81 mg by mouth daily.    . fluticasone (FLONASE) 50 MCG/ACT nasal spray INHALE 2 SPRAYS INTO EACH NOSTRIL EVERY DAY. 16 g 11  . metoprolol (LOPRESSOR) 50 MG tablet Take 1 tablet (50 mg total) by mouth 2 (two) times daily. 60 tablet 1  . Multiple Vitamins-Minerals (MULTIVITAMIN ADULT PO) Take 1 tablet by mouth every other day.    . sildenafil (VIAGRA) 100 MG tablet Take 0.5-1 tablets (50-100 mg total) by mouth as needed. 10 tablet 3  . warfarin (COUMADIN) 5 MG tablet Take 1 tablet (5 mg total) by mouth daily at 6 PM. Or as directed 30 tablet 1   No current facility-administered medications for this visit.    Physical Exam: BP 119/80 mmHg  Pulse 58  Resp 20  Ht 5\' 11"  (1.803 m)  Wt 211 lb (95.709 kg)  BMI 29.44 kg/m2  SpO2 98% He looks well. Lung exam is clear. Cardiac exam shows a regular rate and rhythm with normal heart sounds. Chest incision is healing well and sternum is stable. There is no peripheral edema.   Diagnostic Tests:  CLINICAL DATA: Status  post aortic valve replacement on July 05, 2015; no current chest complaints; follow-up study.  EXAM: CHEST 2 VIEW  COMPARISON: PA and lateral chest x-ray of July 10, 2015  FINDINGS: The lungs are well-expanded. There has been interval clearing of the bilateral pleural effusions. There is no alveolar infiltrate. The heart is normal in size. The pulmonary vascularity is not engorged. The mediastinum is normal in width. There is stable tortuosity of the descending thoracic aorta. The sternal wires are intact. The aortic valve ring is in reasonable position. There is mild degenerative disc disease of the lower thoracic spine.  IMPRESSION: There is no active cardiopulmonary disease. Small bilateral pleural effusions have resolved.   Electronically Signed  By: David Martinique M.D.  On: 08/10/2015 10:27  Impression:  Overall I think he is doing well. I encouraged him to continue walking. 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 He does get to do a lot of walking at work and wants to get back on his stationary bike. I told him he could drive his car but should not lift anything heavier than 10 lbs for three months postop. His persistent nausea and visual changes could be due to the amiodarone. He seems to be maintaining sinus rhythm so I told him that he could stop it. He would also like to stop his lopressor  but I do not want to do that now with the amiodarone being stopped. I told him to decrease the lopressor dose to 25 mg bid. I will plan to see him in 2 weeks to check his rhythm and vitals and to see if his symptoms have improved. His INR was 2.2 today on his current coumadin dose.   Plan:  Return to see me in 2 weeks.   [name], MD [facility] [phone]

## 2015-08-15 ENCOUNTER — Telehealth: Payer: Self-pay | Admitting: Interventional Cardiology

## 2015-08-15 DIAGNOSIS — R7989 Other specified abnormal findings of blood chemistry: Secondary | ICD-10-CM

## 2015-08-15 NOTE — Telephone Encounter (Signed)
Aaron Cole is returning a call °

## 2015-08-15 NOTE — Telephone Encounter (Signed)
Spoke with pt and informed him of lab results. Pt verbalized understanding and was in agreement with this plan. Labs scheduled for 09/13/15.

## 2015-08-24 ENCOUNTER — Encounter: Payer: BLUE CROSS/BLUE SHIELD | Admitting: Surgery

## 2015-08-24 ENCOUNTER — Ambulatory Visit (INDEPENDENT_AMBULATORY_CARE_PROVIDER_SITE_OTHER): Payer: BLUE CROSS/BLUE SHIELD | Admitting: *Deleted

## 2015-08-24 DIAGNOSIS — I4891 Unspecified atrial fibrillation: Secondary | ICD-10-CM | POA: Diagnosis not present

## 2015-08-24 DIAGNOSIS — Z5181 Encounter for therapeutic drug level monitoring: Secondary | ICD-10-CM | POA: Diagnosis not present

## 2015-08-24 LAB — POCT INR: INR: 2.1

## 2015-08-31 ENCOUNTER — Ambulatory Visit (INDEPENDENT_AMBULATORY_CARE_PROVIDER_SITE_OTHER): Payer: Self-pay | Admitting: Surgery

## 2015-08-31 ENCOUNTER — Encounter: Payer: Self-pay | Admitting: Surgery

## 2015-08-31 ENCOUNTER — Encounter: Payer: Self-pay | Admitting: Family Medicine

## 2015-08-31 VITALS — BP 119/79 | HR 74 | Resp 16 | Ht 71.0 in | Wt 211.0 lb

## 2015-08-31 DIAGNOSIS — Z952 Presence of prosthetic heart valve: Secondary | ICD-10-CM

## 2015-08-31 DIAGNOSIS — Z9889 Other specified postprocedural states: Secondary | ICD-10-CM

## 2015-08-31 DIAGNOSIS — Z954 Presence of other heart-valve replacement: Secondary | ICD-10-CM

## 2015-08-31 DIAGNOSIS — Z8679 Personal history of other diseases of the circulatory system: Secondary | ICD-10-CM

## 2015-08-31 NOTE — Progress Notes (Signed)
     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 The patient's early postoperative recovery while in the hospital was notable for development of recurrent atrial fibrillation treated with amiodarone and coumadin. I saw him in the office two weeks ago and he was having persistent nausea, blurry vision and numbness in his feet which he thought was due to the amiodarone. Since he appeared to be maintaining sinus rhythm we stopped it and his symptoms have all disappeared. He still thinks the Lopressor is causing some fatigue and says that he did not tolerate it in the past but he is not very specific. He would like to stop the Lopressor and get back on Micardis for hypertension. He is back working full time with his [employer].  Current Outpatient Prescriptions  Medication Sig Dispense Refill  . aspirin 81 MG tablet Take 81 mg by mouth daily.    . fluticasone (FLONASE) 50 MCG/ACT nasal spray INHALE 2 SPRAYS INTO EACH NOSTRIL EVERY DAY. 16 g 11  . metoprolol (LOPRESSOR) 50 MG tablet Take 1 tablet (50 mg total) by mouth 2 (two) times daily. (Patient taking differently: Take 25 mg by mouth 2 (two) times daily. ) 60 tablet 1  . Multiple Vitamins-Minerals (MULTIVITAMIN ADULT PO) Take 1 tablet by mouth every other day.    . sildenafil (VIAGRA) 100 MG tablet Take 0.5-1 tablets (50-100 mg total) by mouth as needed. 10 tablet 3  . warfarin (COUMADIN) 5 MG tablet Take 1 tablet (5 mg total) by mouth daily at 6 PM. Or as directed 30 tablet 1   No current facility-administered medications for this visit.    Physical Exam: BP 119/79 mmHg  Pulse 74  Resp 16  Ht 5\' 11"  (1.803 m)  Wt 211 lb (95.709 kg)  BMI 29.44 kg/m2  SpO2 97% He looks well. Lung exam is clear. Cardiac exam shows a regular rate and rhythm with normal heart sounds. Chest incision is healing well and sternum is stable. There is no  peripheral edema.   Diagnostic Tests:  Last INR was 2.1 on 2.22/2017  Impression:  Overall I think he is doing well. I encouraged him to continue walking. 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 He does get to do a lot of walking at work and wants to get back on his stationary bike. I told him he could drive his car but should not lift anything heavier than 10 lbs for three months postop.   I told him that he can stop the Lopressor and resume his Micardis 40 mg twice daily.  He appears to be maintaining sinus rhythm off amiodarone so I think at 3 months postop he could get off coumadin and use aspirin alone. He is going to see Dr. Tamala Julian on 10/06/2015 and I think he could get off coumadin if maintaining sinus rhythm at that time.  Plan:  He will followup with Dr. Tamala Julian on 10/07/2015.    [name], MD [facility] [phone]

## 2015-09-07 ENCOUNTER — Ambulatory Visit (INDEPENDENT_AMBULATORY_CARE_PROVIDER_SITE_OTHER): Payer: BLUE CROSS/BLUE SHIELD | Admitting: *Deleted

## 2015-09-07 DIAGNOSIS — Z5181 Encounter for therapeutic drug level monitoring: Secondary | ICD-10-CM | POA: Diagnosis not present

## 2015-09-07 DIAGNOSIS — I4891 Unspecified atrial fibrillation: Secondary | ICD-10-CM | POA: Diagnosis not present

## 2015-09-07 LAB — POCT INR: INR: 1.9

## 2015-09-13 ENCOUNTER — Other Ambulatory Visit (INDEPENDENT_AMBULATORY_CARE_PROVIDER_SITE_OTHER): Payer: BLUE CROSS/BLUE SHIELD | Admitting: *Deleted

## 2015-09-13 DIAGNOSIS — R7989 Other specified abnormal findings of blood chemistry: Secondary | ICD-10-CM

## 2015-09-13 DIAGNOSIS — R748 Abnormal levels of other serum enzymes: Secondary | ICD-10-CM | POA: Diagnosis not present

## 2015-09-13 LAB — BASIC METABOLIC PANEL
BUN: 20 mg/dL (ref 7–25)
CALCIUM: 9.3 mg/dL (ref 8.6–10.3)
CHLORIDE: 106 mmol/L (ref 98–110)
CO2: 24 mmol/L (ref 20–31)
CREATININE: 1.3 mg/dL — AB (ref 0.70–1.25)
Glucose, Bld: 94 mg/dL (ref 65–99)
Potassium: 4.4 mmol/L (ref 3.5–5.3)
SODIUM: 139 mmol/L (ref 135–146)

## 2015-09-13 NOTE — Addendum Note (Signed)
Addended by: Eulis Foster on: 09/13/2015 09:31 AM   Modules accepted: Orders

## 2015-10-06 ENCOUNTER — Encounter: Payer: Self-pay | Admitting: Interventional Cardiology

## 2015-10-06 ENCOUNTER — Other Ambulatory Visit: Payer: Self-pay | Admitting: *Deleted

## 2015-10-06 ENCOUNTER — Ambulatory Visit (INDEPENDENT_AMBULATORY_CARE_PROVIDER_SITE_OTHER): Payer: BLUE CROSS/BLUE SHIELD | Admitting: Interventional Cardiology

## 2015-10-06 ENCOUNTER — Ambulatory Visit (INDEPENDENT_AMBULATORY_CARE_PROVIDER_SITE_OTHER): Payer: BLUE CROSS/BLUE SHIELD | Admitting: *Deleted

## 2015-10-06 VITALS — BP 158/100 | HR 95 | Ht 71.0 in | Wt 215.8 lb

## 2015-10-06 DIAGNOSIS — Z9889 Other specified postprocedural states: Secondary | ICD-10-CM

## 2015-10-06 DIAGNOSIS — N183 Chronic kidney disease, stage 3 unspecified: Secondary | ICD-10-CM

## 2015-10-06 DIAGNOSIS — Z952 Presence of prosthetic heart valve: Secondary | ICD-10-CM

## 2015-10-06 DIAGNOSIS — Z954 Presence of other heart-valve replacement: Secondary | ICD-10-CM

## 2015-10-06 DIAGNOSIS — I4891 Unspecified atrial fibrillation: Secondary | ICD-10-CM | POA: Diagnosis not present

## 2015-10-06 DIAGNOSIS — Z8679 Personal history of other diseases of the circulatory system: Secondary | ICD-10-CM

## 2015-10-06 DIAGNOSIS — Z5181 Encounter for therapeutic drug level monitoring: Secondary | ICD-10-CM | POA: Diagnosis not present

## 2015-10-06 DIAGNOSIS — I10 Essential (primary) hypertension: Secondary | ICD-10-CM

## 2015-10-06 LAB — POCT INR: INR: 1.3

## 2015-10-06 MED ORDER — WARFARIN SODIUM 5 MG PO TABS: 5.0000 mg | ORAL_TABLET | Freq: Every day | ORAL | Status: DC

## 2015-10-06 MED ORDER — HYDROCHLOROTHIAZIDE 12.5 MG PO CAPS: 12.5000 mg | ORAL_CAPSULE | Freq: Every day | ORAL | Status: DC

## 2015-10-06 NOTE — Progress Notes (Signed)
Cardiology Office Note   Date:  10/06/2015   ID:  Aaron Cole, DOB 26-Jul-1950, MRN [id number]  PCP:  [name], MD  Cardiologist:  Sinclair Grooms, MD   Chief Complaint  Patient presents with  . Cardiac Valve Problem      History of Present Illness: Aaron Cole is a 65 y.o. male who presents for follow-up aortic valve replacement and repair of ascending aortic aneurysm. Postoperative atrial fibrillation  Asymptomatic and wants to be off Coumadin therapy. At asymptomatic postoperative atrial fibrillation following surgery. He was unable tolerate beta blocker therapy. This is been discontinued and he is now on Micardis 80 mg per day. Blood pressure is inadequately controlled today. Otherwise no complaints. No bleeding on Coumadin.  Past Medical History  Diagnosis Date  . Sinus headache     recurrent  . HTN (hypertension)   . S/p nephrectomy 2013    [facility]  . CAD (coronary artery disease) 2013    per CT scan  - brings CD of CT, no report - will need [facility] records of CT scan report  . Diverticulosis     by CT scan  . Renal cell carcinoma ([place]) 2013    clear cell s/p R nephrectomy  . CKD (chronic kidney disease) stage 3, GFR 30-59 ml/min 2015    iatrogenic (decreased nephron mass) established with Dr. Alfonse Alpers [facility])    Past Surgical History  Procedure Laterality Date  . Nasal sinus surgery  1978    sinus surgery done at [facility]  . Rectal fistula repair  1980s  . Nephrectomy Right 12/2011    [facility], R for RCC  . Colonoscopy  2011    per pt normal  . Bentall procedure N/A 07/05/2015    Procedure: BENTALL PROCEDURE UTILIZING A 28 X 10 MM WOVEN DOUBLE VELOUR VASCULAR GRAFT AND A 28MM GELWEAVE WOVEN VASCULAR GRAFT. 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.;  Surgeon: [name], MD;  Location: [facility];  Service: Open Heart Surgery;  Laterality: N/A;  . Tee without cardioversion N/A 07/05/2015    Procedure: TRANSESOPHAGEAL  ECHOCARDIOGRAM (TEE);  Surgeon: [name], MD;  Location: [facility];  Service: Open Heart Surgery;  Laterality: N/A;     Current Outpatient Prescriptions  Medication Sig Dispense Refill  . aspirin 81 MG tablet Take 81 mg by mouth daily.    . fluticasone (FLONASE) 50 MCG/ACT nasal spray INHALE 2 SPRAYS INTO EACH NOSTRIL EVERY DAY. 16 g 11  . Multiple Vitamins-Minerals (MULTIVITAMIN ADULT PO) Take 1 tablet by mouth every other day.    . sildenafil (VIAGRA) 100 MG tablet Take 100 mg by mouth daily as needed for erectile dysfunction.    Marland Kitchen telmisartan (MICARDIS) 40 MG tablet Take 40 mg by mouth 2 (two) times daily.    Marland Kitchen warfarin (COUMADIN) 5 MG tablet Take 1 tablet (5 mg total) by mouth daily at 6 PM. Or as directed 30 tablet 1  . hydrochlorothiazide (MICROZIDE) 12.5 MG capsule Take 1 capsule (12.5 mg total) by mouth daily. 30 capsule 11   No current facility-administered medications for this visit.    Allergies:   Review of patient's allergies indicates no known allergies.    Social History:  The patient  reports that he has never smoked. He has never used smokeless tobacco. He reports that he drinks alcohol. He reports that he does not use illicit drugs.   Family History:  The patient's family history includes  Cancer in his paternal aunt, paternal aunt, and paternal grandmother; Colon polyps in his brother and father; Coronary artery disease in his maternal uncle; Diabetes in his father; Heart attack in his maternal grandfather; Heart disease in his father and mother; Hypertension in his mother. There is no history of Stroke.    ROS:  Please see the history of present illness.   Otherwise, review of systems are positive for none.   All other systems are reviewed and negative.    PHYSICAL EXAM: VS:  BP 158/100 mmHg  Pulse 95  Ht 5\' 11"  (1.803 m)  Wt 215 lb 12.8 oz (97.886 kg)  BMI 30.11 kg/m2  SpO2 98% , BMI Body mass index is 30.11 kg/(m^2). GEN: Well nourished, well developed, in no  acute distress HEENT: normal Neck: no JVD, carotid bruits, or masses Cardiac: RRR.  There is no murmur, rub, or gallop. There is no edema. Respiratory:  clear to auscultation bilaterally, normal work of breathing. GI: soft, nontender, nondistended, + BS MS: no deformity or atrophy Skin: warm and dry, no rash Neuro:  Strength and sensation are intact Psych: euthymic mood, full affect   EKG:  EKG is no ordered today.    Recent Labs: 06/29/2015: ALT 32 07/06/2015: Magnesium 2.6* 07/26/2015: Hemoglobin 9.8*; Platelets 501* 09/13/2015: BUN 20; Creat 1.30*; Potassium 4.4; Sodium 139    Lipid Panel    Component Value Date/Time   CHOL 185 12/15/2014 0745   TRIG 157.0* 12/15/2014 0745   TRIG 131 06/21/2011   HDL 33.30* 12/15/2014 0745   CHOLHDL 6 12/15/2014 0745   VLDL 31.4 12/15/2014 0745   LDLCALC 120* 12/15/2014 0745   LDLDIRECT 118 06/21/2011      Wt Readings from Last 3 Encounters:  10/06/15 215 lb 12.8 oz (97.886 kg)  08/31/15 211 lb (95.709 kg)  08/10/15 211 lb (95.709 kg)      Other studies Reviewed: Additional studies/ records that were reviewed today include: Reviewed recent postoperative surgical notes.. The findings include reviewed with recommendation to discontinue Coumadin..    ASSESSMENT AND PLAN:  1. Atrial fibrillation, unspecified type ([place]) Asymptomatic during hospital stay postop  2. S/P ascending aortic aneurysm repair Stable  3. S/P aortic valve replacement Auscultation  4. CKD (chronic kidney disease) stage 3, GFR 30-59 ml/min Not recently checked. Prior nephrectomy.  5. Essential hypertension Elevated    Current medicines are reviewed at length with the patient today.  The patient has the following concerns regarding medicines: .  The following changes/actions have been instituted:    48 hour Holter  Discontinue Coumadin if no asymptomatic episodes of atrial fibrillation  Poor blood pressure control on 80 mg of myocarditis daily.  Add HCTZ 12.5 mg per day.  Basic metabolic panel in [id number] days  Labs/ tests ordered today include:   Orders Placed This Encounter  Procedures  . Basic metabolic panel  . Holter monitor - 48 hour     Disposition:   FU with HS in 6 months  Signed, Sinclair Grooms, MD  10/06/2015 8:43 AM    [facility] [address], [place], [place]  [address] Phone: [phone]; Fax: [phone]

## 2015-10-06 NOTE — Patient Instructions (Signed)
Medication Instructions:  Your physician has recommended you make the following change in your medication:  START HCTZ 12.5mg  daily. An Rx has been sent to you pharmacy   Labwork: Your physician recommends that you return for lab work in: 2 weeks ([facility])   Testing/Procedures: Your physician has recommended that you wear a holter monitor. Holter monitors are medical devices that record the heart's electrical activity. Doctors most often use these monitors to diagnose arrhythmias. Arrhythmias are problems with the speed or rhythm of the heartbeat. The monitor is a small, portable device. You can wear one while you do your normal daily activities. This is usually used to diagnose what is causing palpitations/syncope (passing out).   Follow-Up: Your physician wants you to follow-up in: 4-6 months with Dr.Smith You will receive a reminder letter in the mail two months in advance. If you don't receive a letter, please call our office to schedule the follow-up appointment.   Any Other Special Instructions Will Be Listed Below (If Applicable).     If you need a refill on your cardiac medications before your next appointment, please call your pharmacy.

## 2015-10-12 ENCOUNTER — Ambulatory Visit (INDEPENDENT_AMBULATORY_CARE_PROVIDER_SITE_OTHER): Payer: BLUE CROSS/BLUE SHIELD

## 2015-10-12 DIAGNOSIS — I4891 Unspecified atrial fibrillation: Secondary | ICD-10-CM

## 2015-10-13 ENCOUNTER — Ambulatory Visit (INDEPENDENT_AMBULATORY_CARE_PROVIDER_SITE_OTHER): Payer: BLUE CROSS/BLUE SHIELD | Admitting: *Deleted

## 2015-10-13 DIAGNOSIS — Z5181 Encounter for therapeutic drug level monitoring: Secondary | ICD-10-CM | POA: Diagnosis not present

## 2015-10-13 DIAGNOSIS — I4891 Unspecified atrial fibrillation: Secondary | ICD-10-CM

## 2015-10-13 LAB — POCT INR: INR: 1.3

## 2015-10-14 ENCOUNTER — Other Ambulatory Visit: Payer: Self-pay | Admitting: Family Medicine

## 2015-10-17 ENCOUNTER — Other Ambulatory Visit: Payer: BLUE CROSS/BLUE SHIELD

## 2015-10-17 NOTE — Telephone Encounter (Signed)
Last filled 04-03-14 #10. Last OV with Dr Darnell Level 05-05-15 Multiple Visits with Cardiology

## 2015-10-17 NOTE — Telephone Encounter (Signed)
Sent in

## 2015-10-20 ENCOUNTER — Other Ambulatory Visit (INDEPENDENT_AMBULATORY_CARE_PROVIDER_SITE_OTHER): Payer: BLUE CROSS/BLUE SHIELD | Admitting: *Deleted

## 2015-10-20 ENCOUNTER — Ambulatory Visit (INDEPENDENT_AMBULATORY_CARE_PROVIDER_SITE_OTHER): Payer: BLUE CROSS/BLUE SHIELD | Admitting: *Deleted

## 2015-10-20 DIAGNOSIS — I10 Essential (primary) hypertension: Secondary | ICD-10-CM | POA: Diagnosis not present

## 2015-10-20 DIAGNOSIS — I4891 Unspecified atrial fibrillation: Secondary | ICD-10-CM | POA: Diagnosis not present

## 2015-10-20 DIAGNOSIS — Z5181 Encounter for therapeutic drug level monitoring: Secondary | ICD-10-CM | POA: Diagnosis not present

## 2015-10-20 LAB — POCT INR: INR: 1.8

## 2015-10-20 NOTE — Addendum Note (Signed)
Addended by: Eulis Foster on: 10/20/2015 01:57 PM   Modules accepted: Orders

## 2015-10-20 NOTE — Addendum Note (Signed)
Addended by: Margretta Sidle on: 10/20/2015 02:05 PM   Modules accepted: Level of Service

## 2015-10-21 LAB — BASIC METABOLIC PANEL
BUN: 22 mg/dL (ref 7–25)
CHLORIDE: 102 mmol/L (ref 98–110)
CO2: 20 mmol/L (ref 20–31)
Calcium: 8.6 mg/dL (ref 8.6–10.3)
Creat: 1.52 mg/dL — ABNORMAL HIGH (ref 0.70–1.25)
GLUCOSE: 108 mg/dL — AB (ref 65–99)
Potassium: 3.8 mmol/L (ref 3.5–5.3)
SODIUM: 137 mmol/L (ref 135–146)

## 2015-10-25 ENCOUNTER — Telehealth: Payer: Self-pay

## 2015-10-25 MED ORDER — FISH OIL 1000 MG PO CAPS
1.0000 | ORAL_CAPSULE | Freq: Every day | ORAL | Status: DC
Start: 2015-10-25 — End: 2023-03-11

## 2015-10-25 NOTE — Telephone Encounter (Signed)
-----   Message from [name], MD sent at 10/25/2015 12:20 PM EDT ----- Monitor showed no AF. Okay to stop coumadin. Continue aspirin.

## 2015-10-25 NOTE — Telephone Encounter (Signed)
Pt aware of Monitor results and Dr.Smith's recommendation. Monitor showed no AF.  Okay to stop coumadin. Continue aspirin.   Pt sts that he is resuming Fish Oil and wanted Dr.Smith to know. Adv pt I will fwd Dr.Smith the [facility]

## 2015-10-25 NOTE — Telephone Encounter (Deleted)
-----   Message from [name], MD sent at 10/25/2015 12:20 PM EDT ----- Monitor showed no AF. Okay to stop coumadin. Continue aspirin.

## 2015-10-25 NOTE — Telephone Encounter (Signed)
Thanks, Anticoagulation clinic aware

## 2015-12-20 ENCOUNTER — Other Ambulatory Visit: Payer: Self-pay | Admitting: Family Medicine

## 2016-02-21 ENCOUNTER — Other Ambulatory Visit: Payer: Self-pay | Admitting: Family Medicine

## 2016-02-23 ENCOUNTER — Telehealth: Payer: Self-pay

## 2016-02-23 NOTE — Telephone Encounter (Signed)
Patient notified and appts scheduled

## 2016-02-23 NOTE — Telephone Encounter (Signed)
Ok to space out CPE until 2018. Would have him go ahead and schedule if he can.

## 2016-02-23 NOTE — Telephone Encounter (Signed)
Pt left v/m requesting Dr Darnell Level opinion if he needed to have CPX this year; pt had major heart surgery in Jan 2017 and has had "zillion lab test" since Jan. Pt request cb. Pt last seen 05/05/15 and last annual exam on 12/23/14. No future appt scheduled.

## 2016-03-23 ENCOUNTER — Encounter: Payer: Self-pay | Admitting: Interventional Cardiology

## 2016-04-05 ENCOUNTER — Ambulatory Visit (INDEPENDENT_AMBULATORY_CARE_PROVIDER_SITE_OTHER): Payer: BLUE CROSS/BLUE SHIELD | Admitting: Interventional Cardiology

## 2016-04-05 ENCOUNTER — Encounter: Payer: Self-pay | Admitting: Interventional Cardiology

## 2016-04-05 VITALS — BP 118/82 | HR 77 | Ht 72.0 in | Wt 220.8 lb

## 2016-04-05 DIAGNOSIS — I480 Paroxysmal atrial fibrillation: Secondary | ICD-10-CM | POA: Diagnosis not present

## 2016-04-05 DIAGNOSIS — Z9889 Other specified postprocedural states: Secondary | ICD-10-CM

## 2016-04-05 DIAGNOSIS — E784 Other hyperlipidemia: Secondary | ICD-10-CM

## 2016-04-05 DIAGNOSIS — I10 Essential (primary) hypertension: Secondary | ICD-10-CM

## 2016-04-05 DIAGNOSIS — Z8679 Personal history of other diseases of the circulatory system: Secondary | ICD-10-CM

## 2016-04-05 DIAGNOSIS — Z952 Presence of prosthetic heart valve: Secondary | ICD-10-CM | POA: Diagnosis not present

## 2016-04-05 DIAGNOSIS — E7849 Other hyperlipidemia: Secondary | ICD-10-CM

## 2016-04-05 MED ORDER — HYDROCHLOROTHIAZIDE 12.5 MG PO CAPS: 12.5000 mg | ORAL_CAPSULE | Freq: Every day | ORAL | 3 refills | Status: DC

## 2016-04-05 MED ORDER — TELMISARTAN 40 MG PO TABS
40.0000 mg | ORAL_TABLET | Freq: Two times a day (BID) | ORAL | 3 refills | Status: DC
Start: 2016-04-05 — End: 2017-06-22

## 2016-04-05 NOTE — Patient Instructions (Signed)
Medication Instructions:  Noen  Labwork: None  Testing/Procedures: Your physician has requested that you have an echocardiogram prior to your follow up visit in 6 months.. Echocardiography is a painless test that uses sound waves to create images of your heart. It provides your doctor with information about the size and shape of your heart and how well your heart's chambers and valves are working. This procedure takes approximately one hour. There are no restrictions for this procedure.    Follow-Up: Your physician wants you to follow-up in: 6 months with Dr. Tamala Julian. You will receive a reminder letter in the mail two months in advance. If you don't receive a letter, please call our office to schedule the follow-up appointment.   Any Other Special Instructions Will Be Listed Below (If Applicable).     If you need a refill on your cardiac medications before your next appointment, please call your pharmacy.

## 2016-04-05 NOTE — Progress Notes (Signed)
Cardiology Office Note    Date:  04/05/2016   ID:  Aaron Cole, DOB 01-21-1951, MRN [id number]  PCP:  [name], MD  Cardiologist: Sinclair Grooms, MD   Chief Complaint  Patient presents with  . Follow-up    Aortic valve    History of Present Illness:  Aaron Cole is a 66 y.o. male follow-up aortic valve replacement and repair of ascending aortic aneurysm. Postoperative atrial fibrillation  Underwent Bentall aortic root replacement 07/05/2015 by Dr. Cyndia Bent. A pericardial aortic valve conduit was placed. Postoperative atrial fibrillation occurred but has not been an issue since discharge. 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 He does feel that his exertional tolerance is not back to baseline. No episodes of syncope. No chills or fever.   Past Medical History:  Diagnosis Date  . CAD (coronary artery disease) 2013   per CT scan  - brings CD of CT, no report - will need [facility] records of CT scan report  . CKD (chronic kidney disease) stage 3, GFR 30-59 ml/min 2015   iatrogenic (decreased nephron mass) established with Dr. Alfonse Alpers [facility])  . Diverticulosis    by CT scan  . HTN (hypertension)   . Renal cell carcinoma ([place]) 2013   clear cell s/p R nephrectomy  . S/p nephrectomy 2013   [facility]  . Sinus headache    recurrent    Past Surgical History:  Procedure Laterality Date  . BENTALL PROCEDURE N/A 07/05/2015   Procedure: BENTALL PROCEDURE UTILIZING A 28 X 10 MM WOVEN DOUBLE VELOUR VASCULAR GRAFT AND A 28MM GELWEAVE WOVEN VASCULAR GRAFT. 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.;  Surgeon: [name], MD;  Location: [place] OR;  Service: Open Heart Surgery;  Laterality: N/A;  . COLONOSCOPY  2011   per pt normal  . NASAL SINUS SURGERY  1978   sinus surgery done at [place] Right 12/2011   [facility], R for RCC  . rectal fistula repair  1980s  . TEE WITHOUT CARDIOVERSION N/A 07/05/2015   Procedure:  TRANSESOPHAGEAL ECHOCARDIOGRAM (TEE);  Surgeon: [name], MD;  Location: [facility];  Service: Open Heart Surgery;  Laterality: N/A;    Current Medications: Outpatient Medications Prior to Visit  Medication Sig Dispense Refill  . aspirin 81 MG tablet Take 81 mg by mouth daily.    . fluticasone (FLONASE) 50 MCG/ACT nasal spray INHALE 2 SPRAYS INTO EACH NOSTRIL EVERY DAY. 16 g 11  . hydrochlorothiazide (MICROZIDE) 12.5 MG capsule Take 1 capsule (12.5 mg total) by mouth daily. 30 capsule 11  . Multiple Vitamins-Minerals (MULTIVITAMIN ADULT PO) Take 1 tablet by mouth every other day.    . Omega-3 Fatty Acids (FISH OIL) 1000 MG CAPS Take 1 capsule (1,000 mg total) by mouth daily.  0  . telmisartan (MICARDIS) 40 MG tablet Take 40 mg by mouth 2 (two) times daily.    Marland Kitchen VIAGRA 100 MG tablet TAKE ONE-HALF OR 1 TABLET BY MOUTH AS NEEDED 10 tablet 0   No facility-administered medications prior to visit.      Allergies:   Review of patient's allergies indicates no known allergies.   Social History   Social History  . Marital status: Single    Spouse name: N/A  . Number of children: N/A  . Years of education: N/A   Social History Main Topics  . Smoking status: Never Smoker  . Smokeless tobacco: Never Used  .  Alcohol use Yes     Comment: Socially  . Drug use: No  . Sexual activity: Yes   Other Topics Concern  . None   Social History Narrative   Caffeine: occasional   Lives alone, 1 cat   Occupation: farming business   Edu: BS   Activity: bikes 3-4x/wk about 1 hour   Diet: good water, fruits/vegetables daily, steak 1x/wk, fish 2x/wk     Family History:  The patient's family history includes Cancer in his paternal aunt, paternal aunt, and paternal grandmother; Colon polyps in his brother and father; Coronary artery disease in his maternal uncle; Diabetes in his father; Heart attack in his maternal grandfather; Heart disease in his father and mother; Hypertension in his mother.   ROS:    Please see the history of present illness.    Decreased energy but otherwise no complaints.  All other systems reviewed and are negative.   PHYSICAL EXAM:   VS:  BP 118/82   Pulse 77   Ht 6' (1.829 m)   Wt 220 lb 12.8 oz (100.2 kg)   BMI 29.95 kg/m    GEN: Well nourished, well developed, in no acute distress  HEENT: normal  Neck: no JVD, carotid bruits, or masses Cardiac: RRR; no murmurs, rubs, or gallops,no edema  Respiratory:  clear to auscultation bilaterally, normal work of breathing GI: soft, nontender, nondistended, + BS MS: no deformity or atrophy  Skin: warm and dry, no rash Neuro:  Alert and Oriented x 3, Strength and sensation are intact Psych: euthymic mood, full affect  Wt Readings from Last 3 Encounters:  04/05/16 220 lb 12.8 oz (100.2 kg)  10/06/15 215 lb 12.8 oz (97.9 kg)  08/31/15 211 lb (95.7 kg)      Studies/Labs Reviewed:   EKG:  EKG  Not repeated.  Recent Labs: 06/29/2015: ALT 32 07/06/2015: Magnesium 2.6 07/26/2015: Hemoglobin 9.8; Platelets 501 10/20/2015: BUN 22; Creat 1.52; Potassium 3.8; Sodium 137   Lipid Panel    Component Value Date/Time   CHOL 185 12/15/2014 0745   TRIG 157.0 (H) 12/15/2014 0745   TRIG 131 06/21/2011   HDL 33.30 (L) 12/15/2014 0745   CHOLHDL 6 12/15/2014 0745   VLDL 31.4 12/15/2014 0745   LDLCALC 120 (H) 12/15/2014 0745   LDLDIRECT 118 06/21/2011    Additional studies/ records that were reviewed today include:  No new data. No repeat echo since the Bentall procedure.    ASSESSMENT:    1. S/P ascending aortic aneurysm repair   2. S/P aortic valve replacement   3. Essential hypertension   4. Paroxysmal atrial fibrillation (HCC)   5. Other hyperlipidemia      PLAN:  In order of problems listed above:  1. Status post Bentall procedure. Good healing with Isaac Laud significant clinical problems. Aortic valve auscultation is normal. Needs postoperative baseline echocardiogram. We will do this between now and when  he returns in 6 months. 2. As noted above. 3. Excellent control. Reminded him of the importance of low-salt diet 4. He is to call us if any episodes of palpitations/tachycardia.     Medication Adjustments/Labs and Tests Ordered: Current medicines are reviewed at length with the patient today.  Concerns regarding medicines are outlined above.  Medication changes, Labs and Tests ordered today are listed in the Patient Instructions below. Patient Instructions  Medication Instructions:  [place]  Labwork: None  Testing/Procedures: Your physician has requested that you have an echocardiogram prior to your follow up visit in 6 months.. Echocardiography  is a painless test that uses sound waves to create images of your heart. It provides your doctor with information about the size and shape of your heart and how well your heart's chambers and valves are working. This procedure takes approximately one hour. There are no restrictions for this procedure.    Follow-Up: Your physician wants you to follow-up in: 6 months with Dr. Tamala Julian. You will receive a reminder letter in the mail two months in advance. If you don't receive a letter, please call our office to schedule the follow-up appointment.   Any Other Special Instructions Will Be Listed Below (If Applicable).     If you need a refill on your cardiac medications before your next appointment, please call your pharmacy.      Signed, Sinclair Grooms, MD  04/05/2016 3:53 PM    [facility] [address], [place], [place]  [address] Phone: [phone]; Fax: [phone]

## 2016-04-10 ENCOUNTER — Ambulatory Visit: Payer: BLUE CROSS/BLUE SHIELD | Admitting: Interventional Cardiology

## 2016-04-24 ENCOUNTER — Other Ambulatory Visit: Payer: Self-pay | Admitting: Family Medicine

## 2016-06-18 ENCOUNTER — Other Ambulatory Visit: Payer: Self-pay

## 2016-06-18 ENCOUNTER — Ambulatory Visit (HOSPITAL_COMMUNITY): Payer: BLUE CROSS/BLUE SHIELD | Attending: Internal Medicine

## 2016-06-18 DIAGNOSIS — I340 Nonrheumatic mitral (valve) insufficiency: Secondary | ICD-10-CM | POA: Insufficient documentation

## 2016-06-18 DIAGNOSIS — Z9889 Other specified postprocedural states: Secondary | ICD-10-CM

## 2016-06-18 DIAGNOSIS — R29898 Other symptoms and signs involving the musculoskeletal system: Secondary | ICD-10-CM | POA: Diagnosis not present

## 2016-06-18 DIAGNOSIS — I361 Nonrheumatic tricuspid (valve) insufficiency: Secondary | ICD-10-CM | POA: Diagnosis not present

## 2016-06-18 DIAGNOSIS — Z8679 Personal history of other diseases of the circulatory system: Secondary | ICD-10-CM | POA: Diagnosis not present

## 2016-06-18 DIAGNOSIS — Z952 Presence of prosthetic heart valve: Secondary | ICD-10-CM | POA: Diagnosis not present

## 2016-06-19 ENCOUNTER — Ambulatory Visit (INDEPENDENT_AMBULATORY_CARE_PROVIDER_SITE_OTHER): Payer: BLUE CROSS/BLUE SHIELD

## 2016-06-19 ENCOUNTER — Telehealth: Payer: Self-pay

## 2016-06-19 DIAGNOSIS — Z23 Encounter for immunization: Secondary | ICD-10-CM

## 2016-06-19 NOTE — Telephone Encounter (Signed)
Pt is aware and agreeable to results 

## 2016-06-27 ENCOUNTER — Other Ambulatory Visit: Payer: Self-pay | Admitting: Family Medicine

## 2016-10-21 IMAGING — CR DG CHEST 1V PORT
1 series · 1 of 1 positions shown · non-contrast
Comparison: June 29, 2015

CLINICAL DATA: Status post aortic valve replacement.  Hypoxia.

EXAM:
PORTABLE CHEST 1 VIEW

[AP]
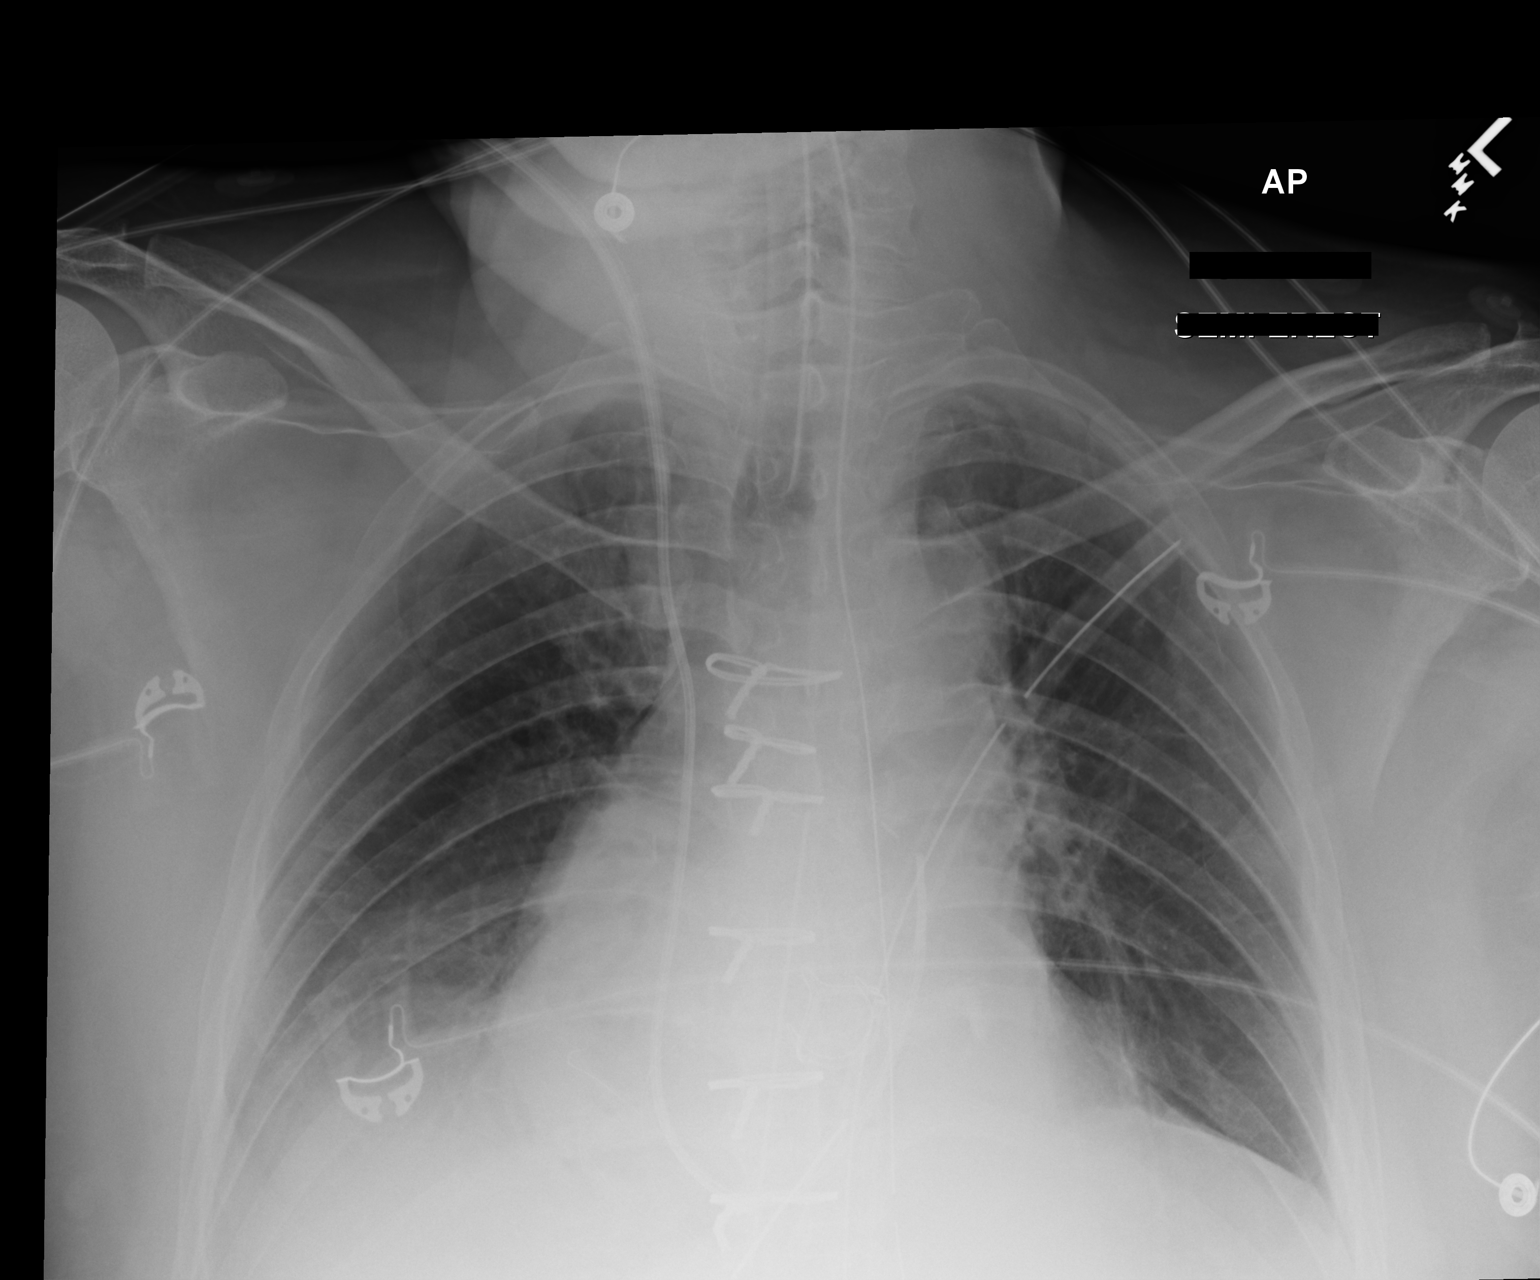

[1 of 1 positions shown; findings below may reference images not displayed]

FINDINGS: Endotracheal tube tip is 5.1 cm above the carina. Swan-Ganz catheter
tip is in the main pulmonary outflow tract just beyond the pulmonic
valve. Nasogastric to side port is just below the gastroesophageal
junction. There is a left chest tube and a mediastinal drain.
Temporary pacemaker wires are attached to the right heart. No
pneumothorax. There is a small right pleural effusion. There is mild
left base atelectasis. Lungs elsewhere clear. Heart is upper normal
in size with pulmonary vascular within normal limits. Aortic valve
replacement present.
IMPRESSION: Tube and catheter positions as described without pneumothorax. Small
right pleural effusion. Mild left base atelectasis. Heart upper
normal in size with pulmonary vascularity within normal limits.

## 2016-10-24 IMAGING — CR DG CHEST 1V PORT
1 series · 1 of 1 positions shown · non-contrast
Comparison: 07/07/2015 and 07/06/2015

CLINICAL DATA: Aortic valve replacement.

EXAM:
PORTABLE CHEST 1 VIEW

[AP]
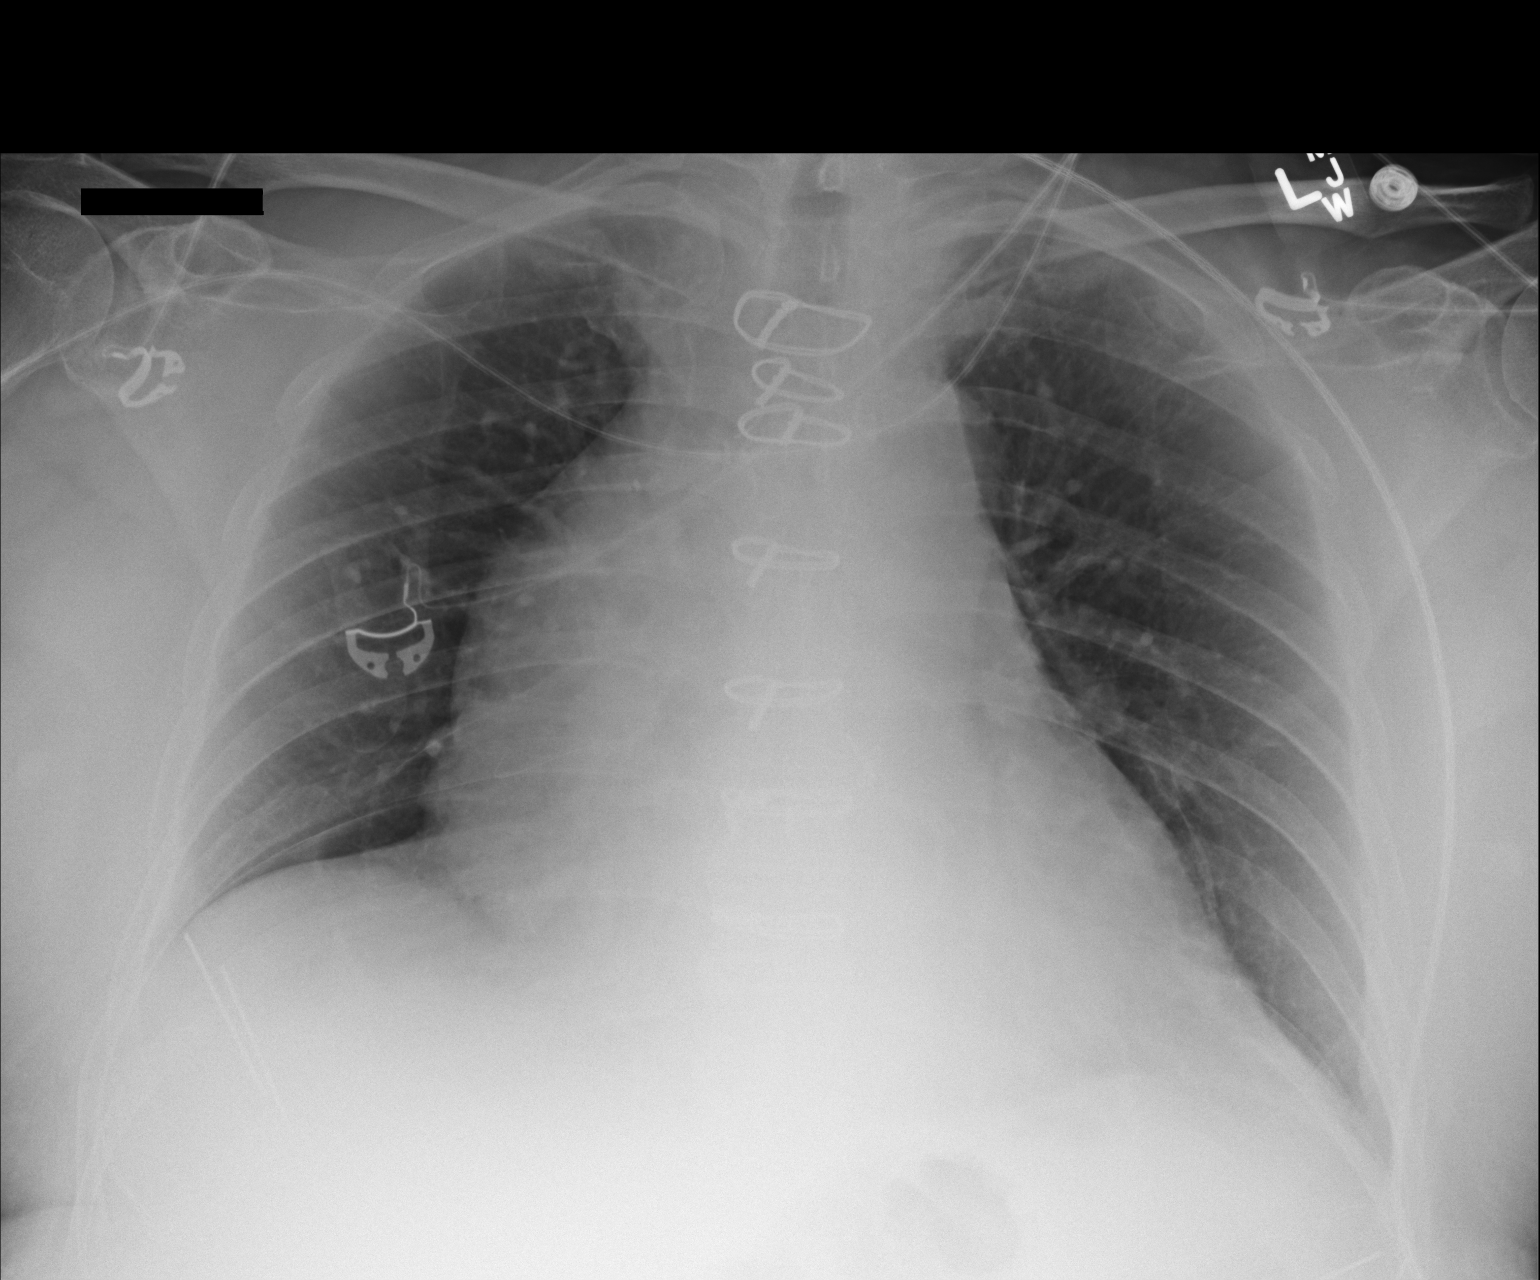

[1 of 1 positions shown; findings below may reference images not displayed]

FINDINGS: Chest tubes have been removed. Along the left heart border which
could be in the pericardium are represent a tiny medial
pneumothorax.

No infiltrates or effusions. The vascularity is normal. Slight
prominence of the right heart border and of the ascending thoracic
aorta.
IMPRESSION: Tiny amount of pericardial air or pneumothorax along the left heart
border.

## 2016-11-17 NOTE — Progress Notes (Signed)
Cardiology Office Note    Date:  11/19/2016   ID:  MONTARIO ZILKA, DOB [birth year], MRN [id number]  PCP:  [name], MD  Cardiologist: Sinclair Grooms, MD   Chief Complaint  Patient presents with  . Cardiac Valve Problem    Bentall procedure    History of Present Illness:  Aaron Cole is a 66 y.o. male follow-up aortic valve replacement and repair of ascending aortic aneurysm with a Bentall procedure January 2017. Postoperative atrial fibrillation has not recurred. History of nephrectomy for renal cell carcinoma with resultant elevated creatinine levels and "stage III, CKD" and essential hypertension.Aaron Cole is doing well. He is status post nephrectomy for renal cell carcinoma. No known recurrence. He denies angina, chest pain, dyspnea, palpitations, and syncope. No medication side effects. Hydrochlorothiazide is been decreased to 3 times per week by his nephrologist, Dr. Tawni Levy. All in all no problems. No neurological complaints.   Past Medical History:  Diagnosis Date  . CAD (coronary artery disease) 2013   per CT scan  - brings CD of CT, no report - will need [facility] records of CT scan report  . CKD (chronic kidney disease) stage 3, GFR 30-59 ml/min 2015   iatrogenic (decreased nephron mass) established with Dr. Alfonse Alpers [facility])  . Diverticulosis    by CT scan  . HTN (hypertension)   . Renal cell carcinoma ([place]) 2013   clear cell s/p R nephrectomy  . S/p nephrectomy 2013   [facility]  . Sinus headache    recurrent    Past Surgical History:  Procedure Laterality Date  . BENTALL PROCEDURE N/A 07/05/2015   Procedure: BENTALL PROCEDURE UTILIZING A 28 X 10 MM WOVEN DOUBLE VELOUR VASCULAR GRAFT AND A 28MM GELWEAVE WOVEN VASCULAR GRAFT. 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.;  Surgeon: [name], MD;  Location: [facility] OR;  Service: Open Heart Surgery;  Laterality: N/A;  . COLONOSCOPY  2011   per pt normal  . NASAL SINUS  SURGERY  1978   sinus surgery done at [place] Right 12/2011   [facility], R for RCC  . rectal fistula repair  1980s  . TEE WITHOUT CARDIOVERSION N/A 07/05/2015   Procedure: TRANSESOPHAGEAL ECHOCARDIOGRAM (TEE);  Surgeon: [name], MD;  Location: [facility];  Service: Open Heart Surgery;  Laterality: N/A;    Current Medications: Outpatient Medications Prior to Visit  Medication Sig Dispense Refill  . aspirin 81 MG tablet Take 81 mg by mouth daily.    . fluticasone (FLONASE) 50 MCG/ACT nasal spray INHALE 2 SPRAYS INTO EACH NOSTRIL EVERY DAY. 16 g 3  . Multiple Vitamins-Minerals (MULTIVITAMIN ADULT PO) Take 1 tablet by mouth every other day.    . Omega-3 Fatty Acids (FISH OIL) 1000 MG CAPS Take 1 capsule (1,000 mg total) by mouth daily.  0  . telmisartan (MICARDIS) 40 MG tablet Take 1 tablet (40 mg total) by mouth 2 (two) times daily. 180 tablet 3  . hydrochlorothiazide (MICROZIDE) 12.5 MG capsule Take 1 capsule (12.5 mg total) by mouth daily. 90 capsule 3  . VIAGRA 100 MG tablet TAKE ONE-HALF TO ONE TABLET BY MOUTH AS NEEDED 10 tablet 1   No facility-administered medications prior to visit.      Allergies:   Patient has no known allergies.   Social History   Social History  . Marital status: Single    Spouse name: N/A  . Number of children: N/A  .  Years of education: N/A   Social History Main Topics  . Smoking status: Never Smoker  . Smokeless tobacco: Never Used  . Alcohol use Yes     Comment: Socially  . Drug use: No  . Sexual activity: Yes   Other Topics Concern  . None   Social History Narrative   Caffeine: occasional   Lives alone, 1 cat   Occupation: farming business   Edu: BS   Activity: bikes 3-4x/wk about 1 hour   Diet: good water, fruits/vegetables daily, steak 1x/wk, fish 2x/wk     Family History:  The patient's family history includes Cancer in his paternal aunt, paternal aunt, and paternal grandmother; Colon polyps in his brother and father;  Coronary artery disease in his maternal uncle; Diabetes in his father; Heart attack in his maternal grandfather; Heart disease in his father and mother; Hypertension in his mother.   ROS:   Please see the history of present illness.    Denies palpitations, snoring, difficulty sleeping, orthopnea, PND, lower extremity edema, and neurological complaints.  All other systems reviewed and are negative.   PHYSICAL EXAM:   VS:  BP 116/84 (BP Location: Left Arm)   Pulse 80   Ht 6' (1.829 m)   Wt 219 lb 9.6 oz (99.6 kg)   BMI 29.78 kg/m    GEN: Well nourished, well developed, in no acute distress  HEENT: normal  Neck: no JVD, carotid bruits, or masses Cardiac: RRR. There is a soft 1/6 systolic murmur. No rub or gallop, and no edema . Respiratory:  clear to auscultation bilaterally, normal work of breathing GI: soft, nontender, nondistended, + BS MS: no deformity or atrophy  Skin: warm and dry, no rash Neuro:  Alert and Oriented x 3, Strength and sensation are intact Psych: euthymic mood, full affect  Wt Readings from Last 3 Encounters:  11/19/16 219 lb 9.6 oz (99.6 kg)  04/05/16 220 lb 12.8 oz (100.2 kg)  10/06/15 215 lb 12.8 oz (97.9 kg)      Studies/Labs Reviewed:   EKG:  EKG  Normal sinus rhythm with left anterior hemiblock/left bundle branch block with left axis deviation. Voltage criteria for LVH. Normal PR interval. There is no change when compared to the prior tracing.  Recent Labs: No results found for requested labs within last 8760 hours.   Lipid Panel    Component Value Date/Time   CHOL 185 12/15/2014 0745   TRIG 157.0 (H) 12/15/2014 0745   TRIG 131 06/21/2011   HDL 33.30 (L) 12/15/2014 0745   CHOLHDL 6 12/15/2014 0745   VLDL 31.4 12/15/2014 0745   LDLCALC 120 (H) 12/15/2014 0745   LDLDIRECT 118 06/21/2011    Additional studies/ records that were reviewed today include:  Coronary angiography performed 06/23/2015 demonstrated widely patent arteries without  evidence of atherosclerosis.    ASSESSMENT:    1. S/P aortic valve replacement   2. Paroxysmal atrial fibrillation (HCC)   3. Essential hypertension   4. CKD (chronic kidney disease) stage 3, GFR 30-59 ml/min   5. Renal cell carcinoma of right kidney (HCC)   6. Other hyperlipidemia      PLAN:  In order of problems listed above:  1. Valve auscultation is normal. There is a very soft scratchy systolic murmur right upper sternal border. No aortic regurgitation. Overall doing well status post aortic valve replacement. 2. No recurrence of atrial fibrillation 3. Blood pressure is relatively low with systolic running less than 115 mmHg taking HCTZ 3 times per  week. Plan to discontinue HCTZ. 4. Creatinine running less than 1.5 and being followed by nephrology from [facility]. 5. Status post resection of right kidney for renal cell carcinoma. 6. LDL cholesterol as noted above is quite high but the patient had no history of atherosclerosis based on angiography. No calcium is noted on CT scanning. Attempt to control LDL to less than or equal to 100 by diet and exercise.  Overall doing quite well. The only change made today that needs follow-up is HCTZ was discontinued because her relatively low blood pressures. His longest being keep the systolic pressure less than 130 mmHg and preferably near all below 130 mmHg we will be okay. If we run above the 295 systolic range we will need to consider resuming/intensifying antihypertensive regimen. Plan clinical follow-up 1 year. He will monitor blood pressures and call if we fall outside the limits set.  Medication Adjustments/Labs and Tests Ordered: Current medicines are reviewed at length with the patient today.  Concerns regarding medicines are outlined above.  Medication changes, Labs and Tests ordered today are listed in the Patient Instructions below. Patient Instructions  Medication Instructions:  1) DISCONTINUE  Hydrochlorothiazide  Labwork: None  Testing/Procedures: None  Follow-Up: Your physician wants you to follow-up in: 1 year with Dr. Tamala Julian. You will receive a reminder letter in the mail two months in advance. If you don't receive a letter, please call our office to schedule the follow-up appointment.   Any Other Special Instructions Will Be Listed Below (If Applicable).  Monitor your blood pressure and if it is consistently greater than 140/90, please let us know.    If you need a refill on your cardiac medications before your next appointment, please call your pharmacy.      Signed, Sinclair Grooms, MD  11/19/2016 5:03 PM    [facility] [place], [place], [place]  [address] Phone: [phone]; Fax: [phone]

## 2016-11-19 ENCOUNTER — Encounter (INDEPENDENT_AMBULATORY_CARE_PROVIDER_SITE_OTHER): Payer: Self-pay

## 2016-11-19 ENCOUNTER — Encounter: Payer: Self-pay | Admitting: Interventional Cardiology

## 2016-11-19 ENCOUNTER — Ambulatory Visit (INDEPENDENT_AMBULATORY_CARE_PROVIDER_SITE_OTHER): Payer: BLUE CROSS/BLUE SHIELD | Admitting: Interventional Cardiology

## 2016-11-19 VITALS — BP 116/84 | HR 80 | Ht 72.0 in | Wt 219.6 lb

## 2016-11-19 DIAGNOSIS — I480 Paroxysmal atrial fibrillation: Secondary | ICD-10-CM

## 2016-11-19 DIAGNOSIS — N183 Chronic kidney disease, stage 3 unspecified: Secondary | ICD-10-CM

## 2016-11-19 DIAGNOSIS — Z952 Presence of prosthetic heart valve: Secondary | ICD-10-CM

## 2016-11-19 DIAGNOSIS — E7849 Other hyperlipidemia: Secondary | ICD-10-CM

## 2016-11-19 DIAGNOSIS — C641 Malignant neoplasm of right kidney, except renal pelvis: Secondary | ICD-10-CM

## 2016-11-19 DIAGNOSIS — E784 Other hyperlipidemia: Secondary | ICD-10-CM

## 2016-11-19 DIAGNOSIS — I10 Essential (primary) hypertension: Secondary | ICD-10-CM

## 2016-11-19 NOTE — Patient Instructions (Signed)
Medication Instructions:  1) DISCONTINUE Hydrochlorothiazide  Labwork: None  Testing/Procedures: None  Follow-Up: Your physician wants you to follow-up in: 1 year with Dr. Tamala Julian. You will receive a reminder letter in the mail two months in advance. If you don't receive a letter, please call our office to schedule the follow-up appointment.   Any Other Special Instructions Will Be Listed Below (If Applicable).  Monitor your blood pressure and if it is consistently greater than 140/90, please let us know.    If you need a refill on your cardiac medications before your next appointment, please call your pharmacy.

## 2016-12-19 ENCOUNTER — Other Ambulatory Visit: Payer: Self-pay | Admitting: Family Medicine

## 2016-12-19 DIAGNOSIS — Z1159 Encounter for screening for other viral diseases: Secondary | ICD-10-CM

## 2016-12-19 DIAGNOSIS — E7849 Other hyperlipidemia: Secondary | ICD-10-CM

## 2016-12-19 DIAGNOSIS — Z125 Encounter for screening for malignant neoplasm of prostate: Secondary | ICD-10-CM

## 2016-12-19 DIAGNOSIS — N183 Chronic kidney disease, stage 3 unspecified: Secondary | ICD-10-CM

## 2016-12-20 ENCOUNTER — Other Ambulatory Visit (INDEPENDENT_AMBULATORY_CARE_PROVIDER_SITE_OTHER): Payer: BLUE CROSS/BLUE SHIELD

## 2016-12-20 ENCOUNTER — Encounter (INDEPENDENT_AMBULATORY_CARE_PROVIDER_SITE_OTHER): Payer: Self-pay

## 2016-12-20 DIAGNOSIS — Z125 Encounter for screening for malignant neoplasm of prostate: Secondary | ICD-10-CM | POA: Diagnosis not present

## 2016-12-20 DIAGNOSIS — E784 Other hyperlipidemia: Secondary | ICD-10-CM | POA: Diagnosis not present

## 2016-12-20 DIAGNOSIS — N183 Chronic kidney disease, stage 3 unspecified: Secondary | ICD-10-CM

## 2016-12-20 DIAGNOSIS — E7849 Other hyperlipidemia: Secondary | ICD-10-CM

## 2016-12-20 DIAGNOSIS — Z1159 Encounter for screening for other viral diseases: Secondary | ICD-10-CM

## 2016-12-20 LAB — CBC WITH DIFFERENTIAL/PLATELET
BASOS ABS: 0.1 10*3/uL (ref 0.0–0.1)
Basophils Relative: 1.1 % (ref 0.0–3.0)
EOS ABS: 0.8 10*3/uL — AB (ref 0.0–0.7)
Eosinophils Relative: 10.3 % — ABNORMAL HIGH (ref 0.0–5.0)
HCT: 41.3 % (ref 39.0–52.0)
Hemoglobin: 13.7 g/dL (ref 13.0–17.0)
LYMPHS ABS: 2.5 10*3/uL (ref 0.7–4.0)
Lymphocytes Relative: 34.1 % (ref 12.0–46.0)
MCHC: 33.1 g/dL (ref 30.0–36.0)
MCV: 85.9 fl (ref 78.0–100.0)
Monocytes Absolute: 0.6 10*3/uL (ref 0.1–1.0)
Monocytes Relative: 8.5 % (ref 3.0–12.0)
NEUTROS ABS: 3.4 10*3/uL (ref 1.4–7.7)
NEUTROS PCT: 46 % (ref 43.0–77.0)
PLATELETS: 196 10*3/uL (ref 150.0–400.0)
RBC: 4.81 Mil/uL (ref 4.22–5.81)
RDW: 14.6 % (ref 11.5–15.5)
WBC: 7.4 10*3/uL (ref 4.0–10.5)

## 2016-12-20 LAB — VITAMIN D 25 HYDROXY (VIT D DEFICIENCY, FRACTURES): VITD: 24.24 ng/mL — ABNORMAL LOW (ref 30.00–100.00)

## 2016-12-20 LAB — COMPREHENSIVE METABOLIC PANEL
ALT: 13 U/L (ref 0–53)
AST: 17 U/L (ref 0–37)
Albumin: 4 g/dL (ref 3.5–5.2)
Alkaline Phosphatase: 60 U/L (ref 39–117)
BILIRUBIN TOTAL: 0.5 mg/dL (ref 0.2–1.2)
BUN: 25 mg/dL — AB (ref 6–23)
CHLORIDE: 107 meq/L (ref 96–112)
CO2: 25 meq/L (ref 19–32)
CREATININE: 1.38 mg/dL (ref 0.40–1.50)
Calcium: 9.3 mg/dL (ref 8.4–10.5)
GFR: 55.08 mL/min — ABNORMAL LOW (ref >60.00)
GLUCOSE: 98 mg/dL (ref 70–99)
Potassium: 4.3 mEq/L (ref 3.5–5.1)
SODIUM: 139 meq/L (ref 135–145)
Total Protein: 6.9 g/dL (ref 6.0–8.3)

## 2016-12-20 LAB — LIPID PANEL
CHOL/HDL RATIO: 6
Cholesterol: 185 mg/dL (ref 0–200)
HDL: 29.9 mg/dL — ABNORMAL LOW (ref >39.00)
LDL CALC: 127 mg/dL — AB (ref 0–99)
NONHDL: 154.62
TRIGLYCERIDES: 136 mg/dL (ref 0.0–149.0)
VLDL: 27.2 mg/dL (ref 0.0–40.0)

## 2016-12-20 LAB — PSA: PSA: 2.53 ng/mL (ref 0.10–4.00)

## 2016-12-21 LAB — HEPATITIS C ANTIBODY: HCV AB: NEGATIVE

## 2016-12-27 ENCOUNTER — Ambulatory Visit (INDEPENDENT_AMBULATORY_CARE_PROVIDER_SITE_OTHER): Payer: BLUE CROSS/BLUE SHIELD | Admitting: Family Medicine

## 2016-12-27 ENCOUNTER — Encounter: Payer: Self-pay | Admitting: Family Medicine

## 2016-12-27 VITALS — BP 138/72 | HR 74 | Temp 98.5°F | Ht 71.0 in | Wt 217.0 lb

## 2016-12-27 DIAGNOSIS — E7849 Other hyperlipidemia: Secondary | ICD-10-CM

## 2016-12-27 DIAGNOSIS — N183 Chronic kidney disease, stage 3 unspecified: Secondary | ICD-10-CM

## 2016-12-27 DIAGNOSIS — Z0000 Encounter for general adult medical examination without abnormal findings: Secondary | ICD-10-CM

## 2016-12-27 DIAGNOSIS — I10 Essential (primary) hypertension: Secondary | ICD-10-CM | POA: Diagnosis not present

## 2016-12-27 DIAGNOSIS — Z9889 Other specified postprocedural states: Secondary | ICD-10-CM

## 2016-12-27 DIAGNOSIS — Z905 Acquired absence of kidney: Secondary | ICD-10-CM | POA: Diagnosis not present

## 2016-12-27 DIAGNOSIS — Z952 Presence of prosthetic heart valve: Secondary | ICD-10-CM | POA: Diagnosis not present

## 2016-12-27 DIAGNOSIS — Z8679 Personal history of other diseases of the circulatory system: Secondary | ICD-10-CM

## 2016-12-27 DIAGNOSIS — E784 Other hyperlipidemia: Secondary | ICD-10-CM | POA: Diagnosis not present

## 2016-12-27 NOTE — Progress Notes (Signed)
BP 138/72 (BP Location: Left Arm, Patient Position: Sitting, Cuff Size: Normal)   Pulse 74   Temp 98.5 F (36.9 C) (Oral)   Ht 5\' 11"  (1.803 m)   Wt 217 lb (98.4 kg)   SpO2 95%   BMI 30.27 kg/m    CC: CPE Subjective:    Patient ID: Aaron Cole, male    DOB: 12/23/1950, 66 y.o.   MRN: [id number]  HPI: MADSEN RIDDLE is a 66 y.o. male presenting on 12/27/2016 for Annual Exam   Last seen 05/2015. S/p bentall procedure 07/2015 - ascending aortic aneurysm repair and aortic valve replacement by Dr Cyndia Bent. He regularly sees cardiologist and cardiothoracic surgeon.   Preventative: Colonoscopy 10/2009 - told normal, rec rpt 8 yrs. This was done  Prostate cancer screening - with urologist Flu yearly Tetanus - 08/2012 shingrix - discussed, pt will look into local pharmacy.  Seat belt use discussed.  Sunscreen use discussed. No changing moles on skin Non smoker Alcohol - drinks on weekends  Caffeine: occasional  Lives alone, 1 cat  Occupation: farming business  Edu: BS  Activity: planning on restarting stationary bicycle Diet: good water, fruits/vegetables daily, steak 1x/wk, fish 2x/wk  Relevant past medical, surgical, family and social history reviewed and updated as indicated. Interim medical history since our last visit reviewed. Allergies and medications reviewed and updated. Outpatient Medications Prior to Visit  Medication Sig Dispense Refill  . aspirin 81 MG tablet Take 81 mg by mouth daily.    . fluticasone (FLONASE) 50 MCG/ACT nasal spray INHALE 2 SPRAYS INTO EACH NOSTRIL EVERY DAY. 16 g 3  . Multiple Vitamins-Minerals (MULTIVITAMIN ADULT PO) Take 1 tablet by mouth every other day.    . Omega-3 Fatty Acids (FISH OIL) 1000 MG CAPS Take 1 capsule (1,000 mg total) by mouth daily.  0  . sildenafil (VIAGRA) 100 MG tablet Take 50-100 mg by mouth daily as needed for erectile dysfunction.    Marland Kitchen telmisartan (MICARDIS) 40 MG tablet Take 1 tablet (40 mg total) by mouth 2 (two)  times daily. 180 tablet 3   No facility-administered medications prior to visit.      Per HPI unless specifically indicated in ROS section below Review of Systems  Constitutional: Negative for activity change, appetite change, chills, fatigue, fever and unexpected weight change.  HENT: Negative for hearing loss.   Eyes: Negative for visual disturbance.  Respiratory: Negative for cough, chest tightness, shortness of breath and wheezing.   Cardiovascular: Negative for chest pain, palpitations and leg swelling.  Gastrointestinal: Negative for abdominal distention, abdominal pain, blood in stool, constipation, diarrhea, nausea and vomiting.  Genitourinary: Negative for difficulty urinating and hematuria.  Musculoskeletal: Negative for arthralgias, myalgias and neck pain.  Skin: Negative for rash.  Neurological: Negative for dizziness, seizures, syncope and headaches.  Hematological: Negative for adenopathy. Does not bruise/bleed easily.  Psychiatric/Behavioral: Negative for dysphoric mood. The patient is not nervous/anxious.        Objective:    BP 138/72 (BP Location: Left Arm, Patient Position: Sitting, Cuff Size: Normal)   Pulse 74   Temp 98.5 F (36.9 C) (Oral)   Ht 5\' 11"  (1.803 m)   Wt 217 lb (98.4 kg)   SpO2 95%   BMI 30.27 kg/m   Wt Readings from Last 3 Encounters:  12/27/16 217 lb (98.4 kg)  11/19/16 219 lb 9.6 oz (99.6 kg)  04/05/16 220 lb 12.8 oz (100.2 kg)    Physical Exam  Constitutional: He is oriented to  person, place, and time. He appears well-developed and well-nourished. No distress.  HENT:  Head: Normocephalic and atraumatic.  Right Ear: Hearing, tympanic membrane, external ear and ear canal normal.  Left Ear: Hearing, tympanic membrane, external ear and ear canal normal.  Nose: Nose normal.  Mouth/Throat: Uvula is midline, oropharynx is clear and moist and mucous membranes are normal. No oropharyngeal exudate, posterior oropharyngeal edema or posterior  oropharyngeal erythema.  Eyes: Conjunctivae and EOM are normal. Pupils are equal, round, and reactive to light. No scleral icterus.  Neck: Normal range of motion. Neck supple. No thyromegaly present.  Cardiovascular: Normal rate, regular rhythm and intact distal pulses.   Murmur (best USB) heard. Pulses:      Radial pulses are 2+ on the right side, and 2+ on the left side.  Pulmonary/Chest: Effort normal and breath sounds normal. No respiratory distress. He has no wheezes. He has no rales.  Abdominal: Soft. Bowel sounds are normal. He exhibits no distension and no mass. There is no tenderness. There is no rebound and no guarding.  Musculoskeletal: Normal range of motion. He exhibits no edema.  Lymphadenopathy:    He has no cervical adenopathy.  Neurological: He is alert and oriented to person, place, and time.  CN grossly intact, station and gait intact  Skin: Skin is warm and dry. No rash noted.  Psychiatric: He has a normal mood and affect. His behavior is normal. Judgment and thought content normal.  Nursing note and vitals reviewed.  Results for orders placed or performed in visit on 12/20/16  Lipid panel  Result Value Ref Range   Cholesterol 185 0 - 200 mg/dL   Triglycerides 136.0 0.0 - 149.0 mg/dL   HDL 29.90 (L) >39.00 mg/dL   VLDL 27.2 0.0 - 40.0 mg/dL   LDL Cholesterol 127 (H) 0 - 99 mg/dL   Total CHOL/HDL Ratio 6    NonHDL 154.62   CBC with Differential/Platelet  Result Value Ref Range   WBC 7.4 4.0 - 10.5 K/uL   RBC 4.81 4.22 - 5.81 Mil/uL   Hemoglobin 13.7 13.0 - 17.0 g/dL   HCT 41.3 39.0 - 52.0 %   MCV 85.9 78.0 - 100.0 fl   MCHC 33.1 30.0 - 36.0 g/dL   RDW 14.6 11.5 - 15.5 %   Platelets 196.0 150.0 - 400.0 K/uL   Neutrophils Relative % 46.0 43.0 - 77.0 %   Lymphocytes Relative 34.1 12.0 - 46.0 %   Monocytes Relative 8.5 3.0 - 12.0 %   Eosinophils Relative 10.3 (H) 0.0 - 5.0 %   Basophils Relative 1.1 0.0 - 3.0 %   Neutro Abs 3.4 1.4 - 7.7 K/uL   Lymphs Abs  2.5 0.7 - 4.0 K/uL   Monocytes Absolute 0.6 0.1 - 1.0 K/uL   Eosinophils Absolute 0.8 (H) 0.0 - 0.7 K/uL   Basophils Absolute 0.1 0.0 - 0.1 K/uL  VITAMIN D 25 Hydroxy (Vit-D Deficiency, Fractures)  Result Value Ref Range   VITD 24.24 (L) 30.00 - 100.00 ng/mL  Comprehensive metabolic panel  Result Value Ref Range   Sodium 139 135 - 145 mEq/L   Potassium 4.3 3.5 - 5.1 mEq/L   Chloride 107 96 - 112 mEq/L   CO2 25 19 - 32 mEq/L   Glucose, Bld 98 70 - 99 mg/dL   BUN 25 (H) 6 - 23 mg/dL   Creatinine, Ser 1.38 0.40 - 1.50 mg/dL   Total Bilirubin 0.5 0.2 - 1.2 mg/dL   Alkaline Phosphatase 60 39 -  117 U/L   AST 17 0 - 37 U/L   ALT 13 0 - 53 U/L   Total Protein 6.9 6.0 - 8.3 g/dL   Albumin 4.0 3.5 - 5.2 g/dL   Calcium 9.3 8.4 - 10.5 mg/dL   GFR 55.08 (L) >60.00 mL/min  PSA  Result Value Ref Range   PSA 2.53 0.10 - 4.00 ng/mL  Hepatitis C antibody  Result Value Ref Range   HCV Ab NEGATIVE NEGATIVE      Assessment & Plan:   Problem List Items Addressed This Visit    CKD (chronic kidney disease) stage 3, GFR 30-59 ml/min    Chronic, stable. Continue f/u with renal.       HLD (hyperlipidemia)    Chronic. Reviewed healthy diet changes to affect sustainable weight loss. Consider statin given ASCVD risk   The 10-year ASCVD risk score Mikey Bussing DC Brooke Bonito., et al., 2013) is: 19.8%   Values used to calculate the score:     Age: 23 years     Sex: Male     Is Non-Hispanic African American: No     Diabetic: No     Tobacco smoker: No     Systolic Blood Pressure: 127 mmHg     Is BP treated: Yes     HDL Cholesterol: 29.9 mg/dL     Total Cholesterol: 185 mg/dL       HTN (hypertension)    Chronic, stable followed by renal and cards.       Routine health maintenance - Primary    Preventative protocols reviewed and updated unless pt declined. Discussed healthy diet and lifestyle.       S/P aortic valve replacement    Organic aortic valve, only needs to be on aspirin 81mg  daily.       S/P  ascending aortic aneurysm repair   S/p nephrectomy       Follow up plan: Return in about 1 year (around 12/27/2017) for annual exam, prior fasting for blood work.  [name], MD

## 2016-12-27 NOTE — Assessment & Plan Note (Signed)
Chronic. Reviewed healthy diet changes to affect sustainable weight loss. Consider statin given ASCVD risk   The 10-year ASCVD risk score Mikey Bussing DC Brooke Bonito., et al., 2013) is: 19.8%   Values used to calculate the score:     Age: 66 years     Sex: Male     Is Non-Hispanic African American: No     Diabetic: No     Tobacco smoker: No     Systolic Blood Pressure: 333 mmHg     Is BP treated: Yes     HDL Cholesterol: 29.9 mg/dL     Total Cholesterol: 185 mg/dL

## 2016-12-27 NOTE — Patient Instructions (Addendum)
Sign release for records of colonoscopy from [facility] with local pharmacy about new 2 shot shingles series (shingrix).  Restart vitamin D 1000 units daily. Return as needed or in 1 year for next physical.   Health Maintenance, Male A healthy lifestyle and preventive care is important for your health and wellness. Ask your health care provider about what schedule of regular examinations is right for you. What should I know about weight and diet? Eat a Healthy Diet  Eat plenty of vegetables, fruits, whole grains, low-fat dairy products, and lean protein.  Do not eat a lot of foods high in solid fats, added sugars, or salt.  Maintain a Healthy Weight Regular exercise can help you achieve or maintain a healthy weight. You should:  Do at least 150 minutes of exercise each week. The exercise should increase your heart rate and make you sweat (moderate-intensity exercise).  Do strength-training exercises at least twice a week.  Watch Your Levels of Cholesterol and Blood Lipids  Have your blood tested for lipids and cholesterol every 5 years starting at 64 years of age. If you are at high risk for heart disease, you should start having your blood tested when you are 64 years old. You may need to have your cholesterol levels checked more often if: ? Your lipid or cholesterol levels are high. ? You are older than 64 years of age. ? You are at high risk for heart disease.  What should I know about cancer screening? Many types of cancers can be detected early and may often be prevented. Lung Cancer  You should be screened every year for lung cancer if: ? You are a current smoker who has smoked for at least 30 years. ? You are a former smoker who has quit within the past 15 years.  Talk to your health care provider about your screening options, when you should start screening, and how often you should be screened.  Colorectal Cancer  Routine colorectal cancer screening  usually begins at 64 years of age and should be repeated every 5-10 years until you are 64 years old. You may need to be screened more often if early forms of precancerous polyps or small growths are found. Your health care provider may recommend screening at an earlier age if you have risk factors for colon cancer.  Your health care provider may recommend using home test kits to check for hidden blood in the stool.  A small camera at the end of a tube can be used to examine your colon (sigmoidoscopy or colonoscopy). This checks for the earliest forms of colorectal cancer.  Prostate and Testicular Cancer  Depending on your age and overall health, your health care provider may do certain tests to screen for prostate and testicular cancer.  Talk to your health care provider about any symptoms or concerns you have about testicular or prostate cancer.  Skin Cancer  Check your skin from head to toe regularly.  Tell your health care provider about any new moles or changes in moles, especially if: ? There is a change in a mole's size, shape, or color. ? You have a mole that is larger than a pencil eraser.  Always use sunscreen. Apply sunscreen liberally and repeat throughout the day.  Protect yourself by wearing long sleeves, pants, a wide-brimmed hat, and sunglasses when outside.  What should I know about heart disease, diabetes, and high blood pressure?  If you are 30-61 years of age, have your blood  pressure checked every 3-5 years. If you are 83 years of age or older, have your blood pressure checked every year. You should have your blood pressure measured twice-once when you are at a hospital or clinic, and once when you are not at a hospital or clinic. Record the average of the two measurements. To check your blood pressure when you are not at a hospital or clinic, you can use: ? An automated blood pressure machine at a pharmacy. ? A home blood pressure monitor.  Talk to your health care  provider about your target blood pressure.  If you are between 90-9 years old, ask your health care provider if you should take aspirin to prevent heart disease.  Have regular diabetes screenings by checking your fasting blood sugar level. ? If you are at a normal weight and have a low risk for diabetes, have this test once every three years after the age of 5. ? If you are overweight and have a high risk for diabetes, consider being tested at a younger age or more often.  A one-time screening for abdominal aortic aneurysm (AAA) by ultrasound is recommended for men aged 56-75 years who are current or former smokers. What should I know about preventing infection? Hepatitis B If you have a higher risk for hepatitis B, you should be screened for this virus. Talk with your health care provider to find out if you are at risk for hepatitis B infection. Hepatitis C Blood testing is recommended for:  Everyone born from [birth year] through 1963.  Anyone with known risk factors for hepatitis C.  Sexually Transmitted Diseases (STDs)  You should be screened each year for STDs including gonorrhea and chlamydia if: ? You are sexually active and are younger than 64 years of age. ? You are older than 64 years of age and your health care provider tells you that you are at risk for this type of infection. ? Your sexual activity has changed since you were last screened and you are at an increased risk for chlamydia or gonorrhea. Ask your health care provider if you are at risk.  Talk with your health care provider about whether you are at high risk of being infected with HIV. Your health care provider may recommend a prescription medicine to help prevent HIV infection.  What else can I do?  Schedule regular health, dental, and eye exams.  Stay current with your vaccines (immunizations).  Do not use any tobacco products, such as cigarettes, chewing tobacco, and e-cigarettes. If you need help quitting, ask  your health care provider.  Limit alcohol intake to no more than 2 drinks per day. One drink equals 12 ounces of beer, 5 ounces of wine, or 1 ounces of hard liquor.  Do not use street drugs.  Do not share needles.  Ask your health care provider for help if you need support or information about quitting drugs.  Tell your health care provider if you often feel depressed.  Tell your health care provider if you have ever been abused or do not feel safe at home. This information is not intended to replace advice given to you by your health care provider. Make sure you discuss any questions you have with your health care provider. Document Released: 12/15/2007 Document Revised: 02/15/2016 Document Reviewed: 03/22/2015 Elsevier Interactive Patient Education  [employer].

## 2016-12-27 NOTE — Assessment & Plan Note (Signed)
Organic aortic valve, only needs to be on aspirin 81mg  daily.

## 2016-12-27 NOTE — Assessment & Plan Note (Signed)
Chronic, stable followed by renal and cards.

## 2016-12-27 NOTE — Assessment & Plan Note (Signed)
Chronic, stable. Continue f/u with renal.

## 2016-12-27 NOTE — Assessment & Plan Note (Signed)
Preventative protocols reviewed and updated unless pt declined. Discussed healthy diet and lifestyle.  

## 2017-01-01 ENCOUNTER — Encounter: Payer: Self-pay | Admitting: Family Medicine

## 2017-01-22 ENCOUNTER — Telehealth: Payer: Self-pay | Admitting: Family Medicine

## 2017-01-22 NOTE — Telephone Encounter (Signed)
ROI fax to [facility]

## 2017-02-16 ENCOUNTER — Encounter: Payer: Self-pay | Admitting: Family Medicine

## 2017-05-27 NOTE — Telephone Encounter (Signed)
[facility]

## 2017-06-22 ENCOUNTER — Other Ambulatory Visit: Payer: Self-pay | Admitting: Interventional Cardiology

## 2017-10-17 ENCOUNTER — Other Ambulatory Visit: Payer: Self-pay | Admitting: Interventional Cardiology

## 2017-10-17 MED ORDER — TELMISARTAN 80 MG PO TABS: ORAL_TABLET | ORAL | 0 refills | Status: DC

## 2017-10-18 ENCOUNTER — Other Ambulatory Visit: Payer: Self-pay

## 2017-11-19 ENCOUNTER — Other Ambulatory Visit: Payer: Self-pay | Admitting: Interventional Cardiology

## 2017-12-22 ENCOUNTER — Other Ambulatory Visit: Payer: Self-pay | Admitting: Family Medicine

## 2017-12-22 DIAGNOSIS — E7849 Other hyperlipidemia: Secondary | ICD-10-CM

## 2017-12-22 DIAGNOSIS — Z125 Encounter for screening for malignant neoplasm of prostate: Secondary | ICD-10-CM

## 2017-12-22 DIAGNOSIS — C641 Malignant neoplasm of right kidney, except renal pelvis: Secondary | ICD-10-CM

## 2017-12-22 DIAGNOSIS — N183 Chronic kidney disease, stage 3 unspecified: Secondary | ICD-10-CM

## 2017-12-23 ENCOUNTER — Other Ambulatory Visit (INDEPENDENT_AMBULATORY_CARE_PROVIDER_SITE_OTHER): Payer: Medicare Other

## 2017-12-23 DIAGNOSIS — N183 Chronic kidney disease, stage 3 unspecified: Secondary | ICD-10-CM

## 2017-12-23 DIAGNOSIS — E7849 Other hyperlipidemia: Secondary | ICD-10-CM

## 2017-12-23 DIAGNOSIS — Z125 Encounter for screening for malignant neoplasm of prostate: Secondary | ICD-10-CM

## 2017-12-23 LAB — RENAL FUNCTION PANEL
Albumin: 4 g/dL (ref 3.5–5.2)
BUN: 23 mg/dL (ref 6–23)
CHLORIDE: 105 meq/L (ref 96–112)
CO2: 25 meq/L (ref 19–32)
CREATININE: 1.26 mg/dL (ref 0.40–1.50)
Calcium: 9.4 mg/dL (ref 8.4–10.5)
GFR: 60.98 mL/min (ref >60.00)
Glucose, Bld: 110 mg/dL — ABNORMAL HIGH (ref 70–99)
Phosphorus: 3.3 mg/dL (ref 2.3–4.6)
Potassium: 4.4 mEq/L (ref 3.5–5.1)
Sodium: 138 mEq/L (ref 135–145)

## 2017-12-23 LAB — PSA: PSA: 1.34 ng/mL (ref 0.10–4.00)

## 2017-12-23 LAB — CBC WITH DIFFERENTIAL/PLATELET
BASOS ABS: 0 10*3/uL (ref 0.0–0.1)
Basophils Relative: 0.7 % (ref 0.0–3.0)
EOS ABS: 0.1 10*3/uL (ref 0.0–0.7)
Eosinophils Relative: 1.2 % (ref 0.0–5.0)
HCT: 43.7 % (ref 39.0–52.0)
Hemoglobin: 15 g/dL (ref 13.0–17.0)
LYMPHS ABS: 2.2 10*3/uL (ref 0.7–4.0)
Lymphocytes Relative: 39.6 % (ref 12.0–46.0)
MCHC: 34.2 g/dL (ref 30.0–36.0)
MCV: 90.8 fl (ref 78.0–100.0)
MONO ABS: 0.7 10*3/uL (ref 0.1–1.0)
MONOS PCT: 11.7 % (ref 3.0–12.0)
NEUTROS ABS: 2.6 10*3/uL (ref 1.4–7.7)
NEUTROS PCT: 46.8 % (ref 43.0–77.0)
PLATELETS: 168 10*3/uL (ref 150.0–400.0)
RBC: 4.82 Mil/uL (ref 4.22–5.81)
RDW: 14.3 % (ref 11.5–15.5)
WBC: 5.6 10*3/uL (ref 4.0–10.5)

## 2017-12-23 LAB — LIPID PANEL
CHOL/HDL RATIO: 6
Cholesterol: 193 mg/dL (ref 0–200)
HDL: 31.1 mg/dL — ABNORMAL LOW (ref >39.00)
LDL Cholesterol: 128 mg/dL — ABNORMAL HIGH (ref 0–99)
NONHDL: 161.56
Triglycerides: 166 mg/dL — ABNORMAL HIGH (ref 0.0–149.0)
VLDL: 33.2 mg/dL (ref 0.0–40.0)

## 2017-12-23 LAB — VITAMIN D 25 HYDROXY (VIT D DEFICIENCY, FRACTURES): VITD: 41.97 ng/mL (ref 30.00–100.00)

## 2017-12-25 LAB — MICROALBUMIN / CREATININE URINE RATIO
CREATININE, U: 114.4 mg/dL
MICROALB/CREAT RATIO: 0.6 mg/g (ref 0.0–30.0)

## 2017-12-25 NOTE — Addendum Note (Signed)
Addended by: Lendon Collar on: 12/25/2017 01:27 PM   Modules accepted: Orders

## 2017-12-30 ENCOUNTER — Ambulatory Visit (INDEPENDENT_AMBULATORY_CARE_PROVIDER_SITE_OTHER): Payer: Medicare Other | Admitting: Family Medicine

## 2017-12-30 ENCOUNTER — Encounter: Payer: Self-pay | Admitting: Family Medicine

## 2017-12-30 VITALS — BP 150/104 | HR 73 | Temp 97.9°F | Ht 70.5 in | Wt 209.0 lb

## 2017-12-30 DIAGNOSIS — Z23 Encounter for immunization: Secondary | ICD-10-CM

## 2017-12-30 DIAGNOSIS — I10 Essential (primary) hypertension: Secondary | ICD-10-CM

## 2017-12-30 DIAGNOSIS — Z8679 Personal history of other diseases of the circulatory system: Secondary | ICD-10-CM | POA: Diagnosis not present

## 2017-12-30 DIAGNOSIS — H25093 Other age-related incipient cataract, bilateral: Secondary | ICD-10-CM | POA: Diagnosis not present

## 2017-12-30 DIAGNOSIS — N183 Chronic kidney disease, stage 3 unspecified: Secondary | ICD-10-CM

## 2017-12-30 DIAGNOSIS — Z0000 Encounter for general adult medical examination without abnormal findings: Secondary | ICD-10-CM

## 2017-12-30 DIAGNOSIS — E7849 Other hyperlipidemia: Secondary | ICD-10-CM | POA: Diagnosis not present

## 2017-12-30 DIAGNOSIS — Z9889 Other specified postprocedural states: Secondary | ICD-10-CM | POA: Diagnosis not present

## 2017-12-30 DIAGNOSIS — Z905 Acquired absence of kidney: Secondary | ICD-10-CM | POA: Diagnosis not present

## 2017-12-30 DIAGNOSIS — Z952 Presence of prosthetic heart valve: Secondary | ICD-10-CM | POA: Diagnosis not present

## 2017-12-30 NOTE — Addendum Note (Signed)
Addended by: Brenton Grills on: 10/02/8766 11:57 AM   Modules accepted: Orders

## 2017-12-30 NOTE — Progress Notes (Addendum)
BP (!) 150/104 (BP Location: Right Arm, Patient Position: Sitting, Cuff Size: Normal)   Pulse 73   Temp 97.9 F (36.6 C) (Oral)   Ht 5' 10.5" (1.791 m)   Wt 209 lb (94.8 kg)   SpO2 97%   BMI 29.56 kg/m    CC: welcome to medicare visit Subjective:    Patient ID: Aaron Cole, male    DOB: Nov 02, 1950, 67 y.o.   MRN: [id number]  HPI: Aaron Cole is a 67 y.o. male presenting on 12/30/2017 for Welcome to Medicare   Had Bentall procedure 07/2015 - ascending aortic aneurysm repair with AVR ([facility]).  S/p R nephrectomy for renal cell cancer (2013). Sees Tawni Levy NP at [facility] nephrology.  To see Dr Tamala Julian cardiology tomorrow - EKG at that time.  HTN - bp elevated - will touch base with Dr Tamala Julian - HCTZ recently stopped due to low readings.   Preventative: COLONOSCOPY 11/2008 WNL records in chart [facility]) Prostate cancer screening - discussed, would like PSA, declines DRE today Lung cancer screening - not eligible AAA screen - not eligible. Flu yearly Tetanus - 08/2012 prevnar - today shingrix - discussed, pt will look into local pharmacy.  Advanced directive: has set up at home. HCPOA is aunt June Reece. Asked to bring copy for chart. Seat belt use discussed.  Sunscreen use discussed. No changing moles on skin.  Non smoker Alcohol - drinks on weekends Dentist Q6 mo - currently undergoig dental work (R upper bridge) Eye exam - yearly  Caffeine: occasional  Lives alone, 1 cat  Occupation: farming business  Edu: BS  Activity: planning on restarting stationary bicycle Diet: good water, fruits/vegetables daily, steak 1x/wk, fish 2x/wk  Relevant past medical, surgical, family and social history reviewed and updated as indicated. Interim medical history since our last visit reviewed. Allergies and medications reviewed and updated. Outpatient Medications Prior to Visit  Medication Sig Dispense Refill  . aspirin 81 MG tablet Take 81 mg by mouth daily.    . fluticasone  (FLONASE) 50 MCG/ACT nasal spray INHALE 2 SPRAYS INTO EACH NOSTRIL EVERY DAY. 16 g 3  . Multiple Vitamins-Minerals (MULTIVITAMIN ADULT PO) Take 1 tablet by mouth every other day.    . Omega-3 Fatty Acids (FISH OIL) 1000 MG CAPS Take 1 capsule (1,000 mg total) by mouth daily.  0  . sildenafil (VIAGRA) 100 MG tablet Take 50-100 mg by mouth daily as needed for erectile dysfunction.    Marland Kitchen telmisartan (MICARDIS) 80 MG tablet TAKE ONE-HALF (1/2) OF A TABLET BY MOUTH TWICE DAILY. PLEASE keep upcoming appt for further refills.2nd attempt 60 tablet 0   No facility-administered medications prior to visit.      Per HPI unless specifically indicated in ROS section below Review of Systems  Constitutional: Negative for chills and fever.  Respiratory: Negative for cough, chest tightness, shortness of breath and wheezing.   Cardiovascular: Negative for chest pain, palpitations and leg swelling.  Gastrointestinal: Negative for abdominal pain, blood in stool, constipation, diarrhea and nausea.  Genitourinary: Negative for hematuria.  Hematological: Does not bruise/bleed easily.  Psychiatric/Behavioral: Negative for dysphoric mood. The patient is not nervous/anxious.        Objective:    BP (!) 150/104 (BP Location: Right Arm, Patient Position: Sitting, Cuff Size: Normal)   Pulse 73   Temp 97.9 F (36.6 C) (Oral)   Ht 5' 10.5" (1.791 m)   Wt 209 lb (94.8 kg)   SpO2 97%   BMI 29.56 kg/m  Wt Readings from Last 3 Encounters:  12/30/17 209 lb (94.8 kg)  12/27/16 217 lb (98.4 kg)  11/19/16 219 lb 9.6 oz (99.6 kg)    Physical Exam  Constitutional: He is oriented to person, place, and time. He appears well-developed and well-nourished. No distress.  HENT:  Head: Normocephalic and atraumatic.  Right Ear: Hearing, tympanic membrane, external ear and ear canal normal.  Left Ear: Hearing, tympanic membrane, external ear and ear canal normal.  Nose: Nose normal.  Mouth/Throat: Uvula is midline,  oropharynx is clear and moist and mucous membranes are normal. No oropharyngeal exudate, posterior oropharyngeal edema or posterior oropharyngeal erythema.  Eyes: Pupils are equal, round, and reactive to light. Conjunctivae and EOM are normal. No scleral icterus.  Neck: Normal range of motion. Neck supple. Carotid bruit is not present. No thyromegaly present.  Cardiovascular: Normal rate, regular rhythm and intact distal pulses.  Murmur (3/6 systolic best at USB) heard. Pulses:      Radial pulses are 2+ on the right side, and 2+ on the left side.  Pulmonary/Chest: Effort normal and breath sounds normal. No respiratory distress. He has no wheezes. He has no rales.  Abdominal: Soft. Bowel sounds are normal. He exhibits no distension and no mass. There is no tenderness. There is no rebound and no guarding.  Musculoskeletal: Normal range of motion. He exhibits no edema.  Lymphadenopathy:    He has no cervical adenopathy.  Neurological: He is alert and oriented to person, place, and time.  CN grossly intact, station and gait intact Recall 3/3 Calculation 4/5 serial 3s  Skin: Skin is warm and dry. No rash noted.  Psychiatric: He has a normal mood and affect. His behavior is normal. Judgment and thought content normal.  Nursing note and vitals reviewed.  Results for orders placed or performed in visit on 12/23/17  VITAMIN D 25 Hydroxy (Vit-D Deficiency, Fractures)  Result Value Ref Range   VITD 41.97 30.00 - 100.00 ng/mL  Renal function panel  Result Value Ref Range   Sodium 138 135 - 145 mEq/L   Potassium 4.4 3.5 - 5.1 mEq/L   Chloride 105 96 - 112 mEq/L   CO2 25 19 - 32 mEq/L   Calcium 9.4 8.4 - 10.5 mg/dL   Albumin 4.0 3.5 - 5.2 g/dL   BUN 23 6 - 23 mg/dL   Creatinine, Ser 1.26 0.40 - 1.50 mg/dL   Glucose, Bld 110 (H) 70 - 99 mg/dL   Phosphorus 3.3 2.3 - 4.6 mg/dL   GFR 60.98 >60.00 mL/min  CBC with Differential/Platelet  Result Value Ref Range   WBC 5.6 4.0 - 10.5 K/uL   RBC 4.82  4.22 - 5.81 Mil/uL   Hemoglobin 15.0 13.0 - 17.0 g/dL   HCT 43.7 39.0 - 52.0 %   MCV 90.8 78.0 - 100.0 fl   MCHC 34.2 30.0 - 36.0 g/dL   RDW 14.3 11.5 - 15.5 %   Platelets 168.0 150.0 - 400.0 K/uL   Neutrophils Relative % 46.8 43.0 - 77.0 %   Lymphocytes Relative 39.6 12.0 - 46.0 %   Monocytes Relative 11.7 3.0 - 12.0 %   Eosinophils Relative 1.2 0.0 - 5.0 %   Basophils Relative 0.7 0.0 - 3.0 %   Neutro Abs 2.6 1.4 - 7.7 K/uL   Lymphs Abs 2.2 0.7 - 4.0 K/uL   Monocytes Absolute 0.7 0.1 - 1.0 K/uL   Eosinophils Absolute 0.1 0.0 - 0.7 K/uL   Basophils Absolute 0.0 0.0 - 0.1 K/uL  PSA  Result Value Ref Range   PSA 1.34 0.10 - 4.00 ng/mL  Lipid panel  Result Value Ref Range   Cholesterol 193 0 - 200 mg/dL   Triglycerides 166.0 (H) 0.0 - 149.0 mg/dL   HDL 31.10 (L) >39.00 mg/dL   VLDL 33.2 0.0 - 40.0 mg/dL   LDL Cholesterol 128 (H) 0 - 99 mg/dL   Total CHOL/HDL Ratio 6    NonHDL 161.56   Microalbumin / creatinine urine ratio  Result Value Ref Range   Microalb, Ur <0.7 0.0 - 1.9 mg/dL   Creatinine,U 114.4 mg/dL   Microalb Creat Ratio 0.6 0.0 - 30.0 mg/g      Assessment & Plan:   Problem List Items Addressed This Visit    Welcome to Medicare preventive visit - Primary    I have personally reviewed the Medicare Annual Wellness questionnaire and have noted 1. The patient's medical and social history 2. Their use of alcohol, tobacco or illicit drugs 3. Their current medications and supplements 4. The patient's functional ability including ADL's, fall risks, home safety risks and hearing or visual impairment. Cognitive function has been assessed and addressed as indicated.  5. Diet and physical activity 6. Evidence for depression or mood disorders The patients weight, height, BMI have been recorded in the chart. I have made referrals, counseling and provided education to the patient based on review of the above and I have provided the pt with a written personalized care plan for  preventive services. Provider list updated.. See scanned questionairre as needed for further documentation. Reviewed preventative protocols and updated unless pt declined.       S/p nephrectomy   S/P ascending aortic aneurysm repair   S/P aortic valve replacement   HTN (hypertension)    Chronic, deteriorated after coming off hctz. Has f/u with cards scheduled for tomorrow. Will defer antihypertensive management to cardiology.       HLD (hyperlipidemia)    Chronic, reviewed healthy diet and lifestyle changes to affect sustainable weight loss.. The 10-year ASCVD risk score Mikey Bussing DC Brooke Bonito., et al., 2013) is: 24.1%   Values used to calculate the score:     Age: 16 years     Sex: Male     Is Non-Hispanic African American: No     Diabetic: No     Tobacco smoker: No     Systolic Blood Pressure: 315 mmHg     Is BP treated: Yes     HDL Cholesterol: 31.1 mg/dL     Total Cholesterol: 193 mg/dL       CKD (chronic kidney disease) stage 3, GFR 30-59 ml/min (HCC)    Chronic, stable period. Appreciate renal care.          No orders of the defined types were placed in this encounter.  No orders of the defined types were placed in this encounter.   Follow up plan: Return in about 1 year (around 12/31/2018) for medicare wellness visit.  [name], MD

## 2017-12-30 NOTE — Patient Instructions (Addendum)
Bring me copy of advanced directive at your convenience to update chart.  You are doing well today. Continue current medicines - touch base tomorrow with Dr Tamala Julian about blood pressures. Return as needed or in 1 year for next wellness visit.  prevnar today  Health Maintenance, Male A healthy lifestyle and preventive care is important for your health and wellness. Ask your health care provider about what schedule of regular examinations is right for you. What should I know about weight and diet? Eat a Healthy Diet  Eat plenty of vegetables, fruits, whole grains, low-fat dairy products, and lean protein.  Do not eat a lot of foods high in solid fats, added sugars, or salt.  Maintain a Healthy Weight Regular exercise can help you achieve or maintain a healthy weight. You should:  Do at least 150 minutes of exercise each week. The exercise should increase your heart rate and make you sweat (moderate-intensity exercise).  Do strength-training exercises at least twice a week.  Watch Your Levels of Cholesterol and Blood Lipids  Have your blood tested for lipids and cholesterol every 5 years starting at 64 years of age. If you are at high risk for heart disease, you should start having your blood tested when you are 65 years old. You may need to have your cholesterol levels checked more often if: ? Your lipid or cholesterol levels are high. ? You are older than 65 years of age. ? You are at high risk for heart disease.  What should I know about cancer screening? Many types of cancers can be detected early and may often be prevented. Lung Cancer  You should be screened every year for lung cancer if: ? You are a current smoker who has smoked for at least 30 years. ? You are a former smoker who has quit within the past 15 years.  Talk to your health care provider about your screening options, when you should start screening, and how often you should be screened.  Colorectal  Cancer  Routine colorectal cancer screening usually begins at 65 years of age and should be repeated every 5-10 years until you are 65 years old. You may need to be screened more often if early forms of precancerous polyps or small growths are found. Your health care provider may recommend screening at an earlier age if you have risk factors for colon cancer.  Your health care provider may recommend using home test kits to check for hidden blood in the stool.  A small camera at the end of a tube can be used to examine your colon (sigmoidoscopy or colonoscopy). This checks for the earliest forms of colorectal cancer.  Prostate and Testicular Cancer  Depending on your age and overall health, your health care provider may do certain tests to screen for prostate and testicular cancer.  Talk to your health care provider about any symptoms or concerns you have about testicular or prostate cancer.  Skin Cancer  Check your skin from head to toe regularly.  Tell your health care provider about any new moles or changes in moles, especially if: ? There is a change in a mole's size, shape, or color. ? You have a mole that is larger than a pencil eraser.  Always use sunscreen. Apply sunscreen liberally and repeat throughout the day.  Protect yourself by wearing long sleeves, pants, a wide-brimmed hat, and sunglasses when outside.  What should I know about heart disease, diabetes, and high blood pressure?  If you are 18-39  years of age, have your blood pressure checked every 3-5 years. If you are 47 years of age or older, have your blood pressure checked every year. You should have your blood pressure measured twice-once when you are at a hospital or clinic, and once when you are not at a hospital or clinic. Record the average of the two measurements. To check your blood pressure when you are not at a hospital or clinic, you can use: ? An automated blood pressure machine at a pharmacy. ? A home blood  pressure monitor.  Talk to your health care provider about your target blood pressure.  If you are between 20-61 years old, ask your health care provider if you should take aspirin to prevent heart disease.  Have regular diabetes screenings by checking your fasting blood sugar level. ? If you are at a normal weight and have a low risk for diabetes, have this test once every three years after the age of 66. ? If you are overweight and have a high risk for diabetes, consider being tested at a younger age or more often.  A one-time screening for abdominal aortic aneurysm (AAA) by ultrasound is recommended for men aged 61-75 years who are current or former smokers. What should I know about preventing infection? Hepatitis B If you have a higher risk for hepatitis B, you should be screened for this virus. Talk with your health care provider to find out if you are at risk for hepatitis B infection. Hepatitis C Blood testing is recommended for:  Everyone born from [birth year] through 1963.  Anyone with known risk factors for hepatitis C.  Sexually Transmitted Diseases (STDs)  You should be screened each year for STDs including gonorrhea and chlamydia if: ? You are sexually active and are younger than 65 years of age. ? You are older than 65 years of age and your health care provider tells you that you are at risk for this type of infection. ? Your sexual activity has changed since you were last screened and you are at an increased risk for chlamydia or gonorrhea. Ask your health care provider if you are at risk.  Talk with your health care provider about whether you are at high risk of being infected with HIV. Your health care provider may recommend a prescription medicine to help prevent HIV infection.  What else can I do?  Schedule regular health, dental, and eye exams.  Stay current with your vaccines (immunizations).  Do not use any tobacco products, such as cigarettes, chewing tobacco, and  e-cigarettes. If you need help quitting, ask your health care provider.  Limit alcohol intake to no more than 2 drinks per day. One drink equals 12 ounces of beer, 5 ounces of wine, or 1 ounces of hard liquor.  Do not use street drugs.  Do not share needles.  Ask your health care provider for help if you need support or information about quitting drugs.  Tell your health care provider if you often feel depressed.  Tell your health care provider if you have ever been abused or do not feel safe at home. This information is not intended to replace advice given to you by your health care provider. Make sure you discuss any questions you have with your health care provider. Document Released: 12/15/2007 Document Revised: 02/15/2016 Document Reviewed: 03/22/2015 Elsevier Interactive Patient Education  [employer].

## 2017-12-30 NOTE — Assessment & Plan Note (Signed)
Chronic, stable period. Appreciate renal care.

## 2017-12-30 NOTE — Assessment & Plan Note (Signed)

## 2017-12-30 NOTE — Assessment & Plan Note (Signed)
Chronic, deteriorated after coming off hctz. Has f/u with cards scheduled for tomorrow. Will defer antihypertensive management to cardiology.

## 2017-12-30 NOTE — Assessment & Plan Note (Addendum)
Chronic, reviewed healthy diet and lifestyle changes to affect sustainable weight loss.. The 10-year ASCVD risk score Mikey Bussing DC Brooke Bonito., et al., 2013) is: 24.1%   Values used to calculate the score:     Age: 67 years     Sex: Male     Is Non-Hispanic African American: No     Diabetic: No     Tobacco smoker: No     Systolic Blood Pressure: 435 mmHg     Is BP treated: Yes     HDL Cholesterol: 31.1 mg/dL     Total Cholesterol: 193 mg/dL

## 2017-12-31 ENCOUNTER — Encounter (INDEPENDENT_AMBULATORY_CARE_PROVIDER_SITE_OTHER): Payer: Self-pay

## 2017-12-31 ENCOUNTER — Ambulatory Visit (INDEPENDENT_AMBULATORY_CARE_PROVIDER_SITE_OTHER): Payer: Medicare Other | Admitting: Nurse Practitioner

## 2017-12-31 ENCOUNTER — Telehealth: Payer: Self-pay | Admitting: *Deleted

## 2017-12-31 ENCOUNTER — Encounter: Payer: Self-pay | Admitting: Nurse Practitioner

## 2017-12-31 VITALS — BP 140/100 | HR 72 | Ht 71.0 in | Wt 210.8 lb

## 2017-12-31 DIAGNOSIS — N183 Chronic kidney disease, stage 3 unspecified: Secondary | ICD-10-CM

## 2017-12-31 DIAGNOSIS — Z952 Presence of prosthetic heart valve: Secondary | ICD-10-CM

## 2017-12-31 DIAGNOSIS — I10 Essential (primary) hypertension: Secondary | ICD-10-CM | POA: Diagnosis not present

## 2017-12-31 DIAGNOSIS — I480 Paroxysmal atrial fibrillation: Secondary | ICD-10-CM

## 2017-12-31 MED ORDER — TELMISARTAN 80 MG PO TABS: ORAL_TABLET | ORAL | 3 refills | Status: DC

## 2017-12-31 MED ORDER — SILDENAFIL CITRATE 100 MG PO TABS: 50.0000 mg | ORAL_TABLET | Freq: Every day | ORAL | 6 refills | Status: DC | PRN

## 2017-12-31 NOTE — Patient Instructions (Addendum)
We will be checking the following labs today - NONE   Medication Instructions:    Continue with your current medicines.   I refilled your Micardis and Viagra today    Testing/Procedures To Be Arranged:  Echocardiogram in December  Follow-Up:   See Dr.Smith in one year    Other Special Instructions:   Keep a check on your BP over the next month - goal is 135/85 or less consistently - call Dr. Thompson Caul nurse - [facility] with an update - we may have to add additional medicine  Try to restrict your salt   Regular exercise.    If you need a refill on your cardiac medications before your next appointment, please call your pharmacy.   Call the [place] office at [phone] if you have any questions, problems or concerns.

## 2017-12-31 NOTE — Telephone Encounter (Signed)
-----   Message from Lenard Galloway V sent at 12/31/2017  3:56 PM EDT ----- Regarding: Echocardiogram Hey Aaron Cole,  Is this an echo or some type of vascular test?

## 2017-12-31 NOTE — Progress Notes (Signed)
CARDIOLOGY OFFICE NOTE  Date:  12/31/2017    Aaron Cole Date of Birth: April 23, 1951 Medical Record #[id number]  PCP:  [name], MD  Cardiologist:  Tamala Julian   Chief Complaint  Patient presents with  . Follow-up    Seen for Dr. Tamala Julian.     History of Present Illness: Aaron Cole is a 67 y.o. male who presents today for a follow up visit. Seen for Dr. Tamala Julian.   He has a history of an aortic valve replacement and repair of ascending aortic aneurysm with a Bentall procedure in January 2017 with Dr. Cyndia Bent. He had post op AF. This has not recurred. He other history includes nephrectomy for renal cell carcinoma with resultant elevated creatinine levels and "stage III CKD" and essential hypertension.  He has not been seen since May of 2018. He was doing well at that time.   Comes in today. Here alone. He feels like he is doing well. No chest pain. Breathing is ok. Trying to lose weight. BP has been up some lately - probably getting too much salt. He is planning on getting back to bike riding. Most recent labs look good. Kidney function fine. Needs refills today. Doing SBE.   Past Medical History:  Diagnosis Date  . CAD (coronary artery disease) 2013   per CT scan  - brings CD of CT, no report - will need [facility] records of CT scan report  . CKD (chronic kidney disease) stage 3, GFR 30-59 ml/min ([place]) 2015   iatrogenic (decreased nephron mass) established with Dr. Alfonse Alpers [facility])  . Diverticulosis    by CT scan  . HTN (hypertension)   . Renal cell carcinoma ([place]) 2013   clear cell s/p R nephrectomy  . S/p nephrectomy 2013   [facility]  . Sinus headache    recurrent    Past Surgical History:  Procedure Laterality Date  . BENTALL PROCEDURE N/A 07/05/2015   Procedure: BENTALL PROCEDURE UTILIZING A 28 X 10 MM WOVEN DOUBLE VELOUR VASCULAR GRAFT AND A 28MM GELWEAVE WOVEN VASCULAR GRAFT. 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.;  Surgeon: [name], MD;  Location: [place] OR;  Service: Open Heart Surgery;  Laterality: N/A;  . COLONOSCOPY  11/2008   [facility]NL records in chart [facility])  . NASAL SINUS SURGERY  1978   sin[facility] at [facility]  . NEPHRECTOMY Right 12/2011   [facility], R for RCC  . rectal fistula repair  1980s  . TEE WITHOUT CARDIOVERSION N/A 07/05/2015   Procedure: TRANSESOPHAGEAL ECHOCARDIOGRAM (TEE);  Surgeon: [name], MD;  Location: [place];  Service: Open Heart Surgery;  Laterality: N/A;     Medications: Current Meds  Medication Sig  . aspirin 81 MG tablet Take 81 mg by mouth daily.  . fluticasone (FLONASE) 50 MCG/ACT nasal spray INHALE 2 SPRAYS INTO EACH NOSTRIL EVERY DAY.  . Multiple Vitamins-Minerals (MULTIVITAMIN ADULT PO) Take 1 tablet by mouth every other day.  . Omega-3 Fatty Acids (FISH OIL) 1000 MG CAPS Take 1 capsule (1,000 mg total) by mouth daily.  . sildenafil (VIAGRA) 100 MG tablet Take 0.5-1 tablets (50-100 mg total) by mouth daily as needed for erectile dysfunction.  Marland Kitchen telmisartan (MICARDIS) 80 MG tablet TAKE ONE-HALF (1/2) OF A TABLET BY MOUTH TWICE DAILY.  . [DISCONTINUED] sildenafil (VIAGRA) 100 MG tablet Take 50-100 mg by mouth daily as needed for erectile dysfunction.  . [DISCONTINUED] telmisartan (MICARDIS) 80 MG tablet TAKE ONE-HALF (  1/2) OF A TABLET BY MOUTH TWICE DAILY. PLEASE keep upcoming appt for further refills.2nd attempt     Allergies: Allergies  Allergen Reactions  . Carvedilol Other (See Comments)    Blurred vision, drowsiness    Social History: The patient  reports that he has never smoked. He has never used smokeless tobacco. He reports that he drinks alcohol. He reports that he does not use drugs.   Family History: The patient's family history includes CAD in his maternal grandfather; Cancer in his paternal aunt, paternal aunt, and paternal grandmother; Colon polyps in his brother and father; Coronary artery disease in his maternal uncle; Crohn's disease in his father; Diabetes  in his father; Heart disease in his father and mother; Hypertension in his mother.   Review of Systems: Please see the history of present illness.   Otherwise, the review of systems is positive for none.   All other systems are reviewed and negative.   Physical Exam: VS:  BP (!) 140/100 (BP Location: Left Arm, Patient Position: Sitting, Cuff Size: Normal)   Pulse 72   Ht 5\' 11"  (1.803 m)   Wt 210 lb 12.8 oz (95.6 kg)   BMI 29.40 kg/m  .  BMI Body mass index is 29.4 kg/m.  Wt Readings from Last 3 Encounters:  12/31/17 210 lb 12.8 oz (95.6 kg)  12/30/17 209 lb (94.8 kg)  12/27/16 217 lb (98.4 kg)   Repeat BP by me is 130/90 by me.   General: Pleasant. Well developed, well nourished and in no acute distress.   HEENT: Normal.  Neck: Supple, no JVD, carotid bruits, or masses noted.  Cardiac: Regular rate and rhythm. Valve sounds fine. Very faint outflow murmur.  No edema.  Respiratory:  Lungs are clear to auscultation bilaterally with normal work of breathing.  GI: Soft and nontender.  MS: No deformity or atrophy. Gait and ROM intact.  Skin: Warm and dry. Color is normal.  Neuro:  Strength and sensation are intact and no gross focal deficits noted.  Psych: Alert, appropriate and with normal affect.   LABORATORY DATA:  EKG:  EKG is ordered today. This demonstrates NSR with RBBB and LVH.   Lab Results  Component Value Date   WBC 5.6 12/23/2017   HGB 15.0 12/23/2017   HCT 43.7 12/23/2017   PLT 168.0 12/23/2017   GLUCOSE 110 (H) 12/23/2017   CHOL 193 12/23/2017   TRIG 166.0 (H) 12/23/2017   HDL 31.10 (L) 12/23/2017   LDLDIRECT 118 06/21/2011   LDLCALC 128 (H) 12/23/2017   ALT 13 12/20/2016   AST 17 12/20/2016   NA 138 12/23/2017   K 4.4 12/23/2017   CL 105 12/23/2017   CREATININE 1.26 12/23/2017   BUN 23 12/23/2017   CO2 25 12/23/2017   TSH 1.90 07/25/2012   PSA 1.34 12/23/2017   INR 1.8 10/20/2015   HGBA1C 5.6 06/29/2015   MICROALBUR <0.7 12/25/2017     BNP  (last 3 results) No results for input(s): BNP in the last 8760 hours.  ProBNP (last 3 results) No results for input(s): PROBNP in the last 8760 hours.   Other Studies Reviewed Today:  Echo Study Conclusions 06/2016  - Left ventricle: The cavity size was normal. Wall thickness was   increased in a pattern of mild LVH. Systolic function was normal.   The estimated ejection fraction was in the range of 60% to 65%.   Basal septal akinesis. Doppler parameters are consistent with   abnormal left ventricular relaxation (  grade 1 diastolic   dysfunction). The E/e&' ratio is <8, suggesting normal LV filling   pressure. - Aortic valve: 25 mm Bioprosthetic AVR. No obstruction (AT <100   msec, DVI >0.25). Mean gradient (S): 12 mm Hg. Peak gradient (S):   23 mm Hg. - Aorta: s/p aortic root graft. Aortic root dimension: 34 mm (ED).   Ascending aortic diameter: 31 mm (S). - Mitral valve: Mildly thickened leaflets . There was mild   regurgitation. - Left atrium: The atrium was normal in size. - Atrial septum: No defect or patent foramen ovale was identified. - Tricuspid valve: There was mild regurgitation. - Pulmonary arteries: PA peak pressure: 27 mm Hg (S). - Inferior vena cava: The vessel was normal in size. The   respirophasic diameter changes were in the normal range (>= 50%),   consistent with normal central venous pressure.  Impressions:  - Compared to a prior study in 07/2015, the LVEF has improved to   60-65%. There is basal septal akinesis. The bioprosthetic aortic   valve is functioning normally.   Assessment/Plan:  1. Prior Bentall with AVR - doing well. Needs echo updated in December. He is doing SBE.   2. HTN - probably not at goal - he wants to monitor for a few weeks, try to cut salt back and continue to work on weight loss - goal is less than 135/85 consistently. We may need to add back the HCTZ - he was on this previously.   3. ED - Viagra refilled today  4. Renal  cell carcinoma   5. HLD - only on fish oil. He does not have a history of atherosclerosis based on prior angiography. No calcium is noted on prior CT scanning.   6. PAF - post op surgery - has not recurred.   Current medicines are reviewed with the patient today.  The patient does not have concerns regarding medicines other than what has been noted above.  The following changes have been made:  See above.  Labs/ tests ordered today include:    Orders Placed This Encounter  Procedures  . EKG 12-Lead  . ECHOCARDIOGRAM COMPLETE     Disposition:   FU with Dr. Tamala Julian in one year.   Patient is agreeable to this plan and will call if any problems develop in the interim.   SignedTruitt Merle, NP  12/31/2017 4:18 PM  [facility] [address] [place], [place]  [address] Phone: [phone] Fax: [phone]

## 2017-12-31 NOTE — Telephone Encounter (Signed)
Hey  Changed order.

## 2018-06-04 DIAGNOSIS — N183 Chronic kidney disease, stage 3 (moderate): Secondary | ICD-10-CM | POA: Diagnosis not present

## 2018-06-11 DIAGNOSIS — E559 Vitamin D deficiency, unspecified: Secondary | ICD-10-CM | POA: Diagnosis not present

## 2018-06-11 DIAGNOSIS — C641 Malignant neoplasm of right kidney, except renal pelvis: Secondary | ICD-10-CM | POA: Diagnosis not present

## 2018-06-11 DIAGNOSIS — N183 Chronic kidney disease, stage 3 (moderate): Secondary | ICD-10-CM | POA: Diagnosis not present

## 2018-06-11 DIAGNOSIS — Z6828 Body mass index (BMI) 28.0-28.9, adult: Secondary | ICD-10-CM | POA: Diagnosis not present

## 2018-06-11 DIAGNOSIS — I129 Hypertensive chronic kidney disease with stage 1 through stage 4 chronic kidney disease, or unspecified chronic kidney disease: Secondary | ICD-10-CM | POA: Diagnosis not present

## 2018-06-13 ENCOUNTER — Other Ambulatory Visit: Payer: Self-pay

## 2018-06-13 ENCOUNTER — Ambulatory Visit (HOSPITAL_COMMUNITY): Payer: Medicare Other | Attending: Cardiology

## 2018-06-13 ENCOUNTER — Other Ambulatory Visit (HOSPITAL_COMMUNITY): Payer: Medicare Other

## 2018-06-13 DIAGNOSIS — N183 Chronic kidney disease, stage 3 unspecified: Secondary | ICD-10-CM

## 2018-06-13 DIAGNOSIS — Z952 Presence of prosthetic heart valve: Secondary | ICD-10-CM

## 2018-06-13 DIAGNOSIS — I480 Paroxysmal atrial fibrillation: Secondary | ICD-10-CM

## 2018-06-13 DIAGNOSIS — I10 Essential (primary) hypertension: Secondary | ICD-10-CM | POA: Diagnosis not present

## 2018-06-18 ENCOUNTER — Other Ambulatory Visit: Payer: Self-pay | Admitting: *Deleted

## 2018-06-18 DIAGNOSIS — I480 Paroxysmal atrial fibrillation: Secondary | ICD-10-CM

## 2018-06-18 DIAGNOSIS — N183 Chronic kidney disease, stage 3 unspecified: Secondary | ICD-10-CM

## 2018-06-21 NOTE — Progress Notes (Unsigned)
Please call patient and let him know that Dr. Tamala Julian has reviewed. He feels pumping function is about 50%.   Will leave on current regimen and will follow this.   See back as planned. Needs salt restriction, monitor for fluid, etc.,   [name], RN, [facility] [address] [place], [place]  [address] [phone]

## 2018-06-23 ENCOUNTER — Telehealth: Payer: Self-pay

## 2018-06-23 NOTE — Telephone Encounter (Signed)
-----   Message from [name], NP sent at 06/21/2018  7:45 AM EST ----- Please let patient know Dr. Tamala Julian has reviewed as well. He feels EF is about 50%.  No change with current regimen.  See back as planned.

## 2018-06-23 NOTE — Telephone Encounter (Signed)
Notes recorded by Frederik Schmidt, RN on 06/23/2018 at 8:04 AM EST The patient has been notified of the result and verbalized understanding. All questions (if any) were answered. Frederik Schmidt, RN 06/23/2018 8:04 AM

## 2018-07-11 ENCOUNTER — Other Ambulatory Visit: Payer: Medicare Other

## 2018-08-08 DIAGNOSIS — H6123 Impacted cerumen, bilateral: Secondary | ICD-10-CM | POA: Diagnosis not present

## 2018-08-08 DIAGNOSIS — H9319 Tinnitus, unspecified ear: Secondary | ICD-10-CM | POA: Diagnosis not present

## 2018-08-19 ENCOUNTER — Encounter: Payer: Self-pay | Admitting: Interventional Cardiology

## 2018-09-04 ENCOUNTER — Ambulatory Visit: Payer: Medicare Other | Admitting: Interventional Cardiology

## 2018-10-21 ENCOUNTER — Telehealth: Payer: Self-pay

## 2018-10-21 ENCOUNTER — Telehealth: Payer: Medicare Other | Admitting: Interventional Cardiology

## 2018-10-21 NOTE — Progress Notes (Signed)
Virtual Visit via Video Note   This visit type was conducted due to national recommendations for restrictions regarding the COVID-19 Pandemic (e.g. social distancing) in an effort to limit this patient's exposure and mitigate transmission in our community.  Due to his co-morbid illnesses, this patient is at least at moderate risk for complications without adequate follow up.  This format is felt to be most appropriate for this patient at this time.  All issues noted in this document were discussed and addressed.  A limited physical exam was performed with this format.  Please refer to the patient's chart for his consent to telehealth for [facility].   Evaluation Performed:  Follow-up visit  Date:  10/22/2018   ID:  Aaron Cole, DOB Jan 17, 1951, MRN [id number]  Patient Location: Home Provider Location: Office  PCP:  [name], MD  Cardiologist:  No primary care provider on file.  Electrophysiologist:  None   Chief Complaint:  AVR  History of Present Illness:    Aaron Cole is a 68 y.o. male with aortic valve replacement and repair of ascending aortic aneurysm with a Bentall procedure January 2017. Postoperative atrial fibrillation has not recurred. History of nephrectomy for renal cell carcinoma with resultant elevated creatinine levels and "stage III, CKD" and essential hypertension..  He has no complaints.  He has gained some weight.  He says his on June is cooking too much and he is eating too much during the pandemic.  Not exercising on purpose.  Does walk up to a mile each day at work.  He denies chest pain, orthopnea, PND, lower extremity swelling.  The patient does not have symptoms concerning for COVID-19 infection (fever, chills, cough, or new shortness of breath).    Past Medical History:  Diagnosis Date  . CAD (coronary artery disease) 2013   per CT scan  - brings CD of CT, no report - will need [facility] records of CT scan report  . CKD (chronic kidney  disease) stage 3, GFR 30-59 ml/min ([place]) 2015   iatrogenic (decreased nephron mass) established with Dr. Alfonse Alpers [facility])  . Diverticulosis    by CT scan  . HTN (hypertension)   . Renal cell carcinoma ([place]) 2013   clear cell s/p R nephrectomy  . S/p nephrectomy 2013   [facility]  . Sinus headache    recurrent   Past Surgical History:  Procedure Laterality Date  . BENTALL PROCEDURE N/A 07/05/2015   Procedure: BENTALL PROCEDURE UTILIZING A 28 X 10 MM WOVEN DOUBLE VELOUR VASCULAR GRAFT AND A 28MM GELWEAVE WOVEN VASCULAR GRAFT. 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.;  Surgeon: [name], MD;  Location: [place] OR;  Service: Open Heart Surgery;  Laterality: N/A;  . COLONOSCOPY  11/2008   WNL records in chart [facility])  . NASAL SINUS SURGERY  1978   sinus surgery done at [facility]  . NEPHRECTOMY Right 12/2011   [facility], R for RCC  . rectal fistula repair  1980s  . TEE WITHOUT CARDIOVERSION N/A 07/05/2015   Procedure: TRANSESOPHAGEAL ECHOCARDIOGRAM (TEE);  Surgeon: [name], MD;  Location: [place];  Service: Open Heart Surgery;  Laterality: N/A;     Current Meds  Medication Sig  . aspirin 81 MG tablet Take 81 mg by mouth daily.  . fluticasone (FLONASE) 50 MCG/ACT nasal spray INHALE 2 SPRAYS INTO EACH NOSTRIL EVERY DAY.  . Multiple Vitamins-Minerals (MULTIVITAMIN ADULT PO) Take 1 tablet by mouth daily.   Marland Kitchen  Omega-3 Fatty Acids (FISH OIL) 1000 MG CAPS Take 1 capsule (1,000 mg total) by mouth daily.  . sildenafil (VIAGRA) 100 MG tablet Take 0.5-1 tablets (50-100 mg total) by mouth daily as needed for erectile dysfunction.  Marland Kitchen telmisartan (MICARDIS) 80 MG tablet TAKE ONE-HALF (1/2) OF A TABLET BY MOUTH TWICE DAILY.     Allergies:   Carvedilol   Social History   Tobacco Use  . Smoking status: Never Smoker  . Smokeless tobacco: Never Used  Substance Use Topics  . Alcohol use: Yes    Comment: Socially  . Drug use: No     Family Hx: The patient's family history  includes CAD in his maternal grandfather; Cancer in his paternal aunt, paternal aunt, and paternal grandmother; Colon polyps in his brother and father; Coronary artery disease in his maternal uncle; Crohn's disease in his father; Diabetes in his father; Heart disease in his father and mother; Hypertension in his mother. There is no history of Stroke.  ROS:   Please see the history of present illness.    No complaints.  No chills, fever, or change in appetite. All other systems reviewed and are negative.   Prior CV studies:   The following studies were reviewed today:  2D Doppler echocardiogram performed 06/13/2018: Study Conclusions  - Left ventricle: The cavity size was normal. There was mild focal   basal hypertrophy of the septum. Systolic function was mildly   reduced. The estimated ejection fraction was in the range of 45%   to 50%. Diffuse hypokinesis. Doppler parameters are consistent   with abnormal left ventricular relaxation (grade 1 diastolic   dysfunction). - Aortic valve: A bioprosthesis was present. Bentall procedure   using a composite 25 mm Edwards Magna-Ease pericardial valve and   28 mm Gelweave valsalva graft (07/2015) Peak velocity (S): 218   cm/s. Mean gradient (S): 10 mm Hg. - Mitral valve: Moderately calcified annulus. There was mild   regurgitation. - Tricuspid valve: There was mild regurgitation.   Labs/Other Tests and Data Reviewed:    EKG:  No ECG reviewed.  Recent Labs: 12/23/2017: BUN 23; Creatinine, Ser 1.26; Hemoglobin 15.0; Platelets 168.0; Potassium 4.4; Sodium 138   Recent Lipid Panel Lab Results  Component Value Date/Time   CHOL 193 12/23/2017 08:13 AM   TRIG 166.0 (H) 12/23/2017 08:13 AM   TRIG 131 06/21/2011   HDL 31.10 (L) 12/23/2017 08:13 AM   CHOLHDL 6 12/23/2017 08:13 AM   LDLCALC 128 (H) 12/23/2017 08:13 AM   LDLDIRECT 118 06/21/2011    Wt Readings from Last 3 Encounters:  12/31/17 210 lb 12.8 oz (95.6 kg)  12/30/17 209 lb  (94.8 kg)  12/27/16 217 lb (98.4 kg)     Objective:    Vital Signs:  BP (!) 138/98   Pulse 68   Ht 5\' 11"  (1.803 m)   BMI 29.40 kg/m    VITAL SIGNS:  reviewed  ASSESSMENT & PLAN:    1. S/P aortic valve replacement   2. Paroxysmal atrial fibrillation (HCC)   3. Essential hypertension   4. CKD (chronic kidney disease) stage 3, GFR 30-59 ml/min (HCC)   5. Renal cell carcinoma of right kidney (HCC)   6. Other hyperlipidemia   7. Chronic combined systolic and diastolic heart failure ([place])   8. Educated About Covid-19 Virus Infection    PLAN:  1. Valve function was normal by relatively recent echo. 2. No clinical recurrence. 3. Blood pressure is elevated.  He has been intolerant of  beta-blockers in the past.  He has a single functioning kidney.  Rather than adding diuretic therapy, will add amlodipine 2.5 mg/day.  He is to monitor blood pressure twice per week with a goal of BP less than 130/80 mmHg.  May need to further uptitrate and may also need to consider adding low-dose diuretic therapy.  He has tried carvedilol and metoprolol in the past but could not tolerate either.  This may need to be revisited. 4. Not reevaluated 5. Stable without recurrence 6. No recent laboratory 7. We will repeat an echocardiogram at the end of 2020 to reassess LV systolic function.  As to avoid excessive salt, keeping daily intake less than 2.5 g/day.  Moderate aerobic activity with at least 150 minutes/week to help modulate blood pressure.  Control weight to ideal which will also help blood pressure.  Plan 2D Doppler echocardiogram in 6 to 8 months prior to the next office visit to reassess LVEF which was slightly lower than prior studies when evaluated in December 2019.  COVID-19 Education: The signs and symptoms of COVID-19 were discussed with the patient and how to seek care for testing (follow up with PCP or arrange E-visit).  The importance of social distancing was discussed today.  Time:    Today, I have spent 15 minutes with the patient with telehealth technology discussing the above problems.     Medication Adjustments/Labs and Tests Ordered: Current medicines are reviewed at length with the patient today.  Concerns regarding medicines are outlined above.   Tests Ordered: Orders Placed This Encounter  Procedures  . ECHOCARDIOGRAM COMPLETE    Medication Changes: Meds ordered this encounter  Medications  . amLODipine (NORVASC) 2.5 MG tablet    Sig: Take 1 tablet (2.5 mg total) by mouth daily.    Dispense:  90 tablet    Refill:  3    Disposition:  Follow up in 8 month(s)  Signed, Sinclair Grooms, MD  10/22/2018 1:11 PM    [facility]

## 2018-10-21 NOTE — Telephone Encounter (Signed)
YOUR CARDIOLOGY TEAM HAS ARRANGED FOR AN E-VISIT FOR YOUR APPOINTMENT - PLEASE REVIEW IMPORTANT INFORMATION BELOW SEVERAL DAYS PRIOR TO YOUR APPOINTMENT  Due to the recent COVID-19 pandemic, we are transitioning in-person office visits to tele-medicine visits in an effort to decrease unnecessary exposure to our patients, their families, and staff. These visits are billed to your insurance just like a normal visit is. We also encourage you to sign up for MyChart if you have not already done so. You will need a smartphone if possible. For patients that do not have this, we can still complete the visit using a regular telephone but do prefer a smartphone to enable video when possible. You may have a family member that lives with you that can help. If possible, we also ask that you have a blood pressure cuff and scale at home to measure your blood pressure, heart rate and weight prior to your scheduled appointment. Patients with clinical needs that need an in-person evaluation and testing will still be able to come to the office if absolutely necessary. If you have any questions, feel free to call our office.     YOUR PROVIDER WILL BE USING THE FOLLOWING PLATFORM TO COMPLETE YOUR VISIT: Yes  . IF USING MYCHART - How to Download the MyChart App to Your SmartPhone   - If Apple, go to App Store and type in MyChart in the search bar and download the app. If Android, ask patient to go to Google Play Store and type in MyChart in the search bar and download the app. The app is free but as with any other app downloads, your phone may require you to verify saved payment information or Apple/Android password.  - You will need to then log into the app with your MyChart username and password, and select [facility] as your healthcare provider to link the account.  - When it is time for your visit, go to the MyChart app, find appointments, and click Begin Video Visit. Be sure to Select Allow for your device to access  the Microphone and Camera for your visit. You will then be connected, and your provider will be with you shortly.  **If you have any issues connecting or need assistance, please contact MyChart service desk [phone] ([phone])**  **If using a computer, in order to ensure the best quality for your visit, you will need to use either of the following Internet Browsers: Google Chrome or Microsoft Edge**  . IF USING DOXIMITY or [email or web address].ME - The staff will give you instructions on receiving your link to join the meeting the day of your visit.      2-3 DAYS BEFORE YOUR APPOINTMENT  You will receive a telephone call from one of our [facility] team members - your caller ID may say "Unknown caller." If this is a video visit, we will walk you through how to get the video launched on your phone. We will remind you check your blood pressure, heart rate and weight prior to your scheduled appointment. If you have an Apple Watch or Kardia, please upload any pertinent ECG strips the day before or morning of your appointment to MyChart. Our staff will also make sure you have reviewed the consent and agree to move forward with your scheduled tele-health visit.     THE DAY OF YOUR APPOINTMENT  Approximately 15 minutes prior to your scheduled appointment, you will receive a telephone call from one of [facility] team - your caller ID may say "Unknown caller."    Our staff will confirm medications, vital signs for the day and any symptoms you may be experiencing. Please have this information available prior to the time of visit start. It may also be helpful for you to have a pad of paper and pen handy for any instructions given during your visit. They will also walk you through joining the smartphone meeting if this is a video visit.    CONSENT FOR TELE-HEALTH VISIT - PLEASE REVIEW  I hereby voluntarily request, consent and authorize [facility] and its employed or contracted physicians, physician assistants,  nurse practitioners or other licensed health care professionals (the Practitioner), to provide me with telemedicine health care services (the "Services") as deemed necessary by the treating Practitioner. I acknowledge and consent to receive the Services by the Practitioner via telemedicine. I understand that the telemedicine visit will involve communicating with the Practitioner through live audiovisual communication technology and the disclosure of certain medical information by electronic transmission. I acknowledge that I have been given the opportunity to request an in-person assessment or other available alternative prior to the telemedicine visit and am voluntarily participating in the telemedicine visit.  I understand that I have the right to withhold or withdraw my consent to the use of telemedicine in the course of my care at any time, without affecting my right to future care or treatment, and that the Practitioner or I may terminate the telemedicine visit at any time. I understand that I have the right to inspect all information obtained and/or recorded in the course of the telemedicine visit and may receive copies of available information for a reasonable fee.  I understand that some of the potential risks of receiving the Services via telemedicine include:  . Delay or interruption in medical evaluation due to technological equipment failure or disruption; . Information transmitted may not be sufficient (e.g. poor resolution of images) to allow for appropriate medical decision making by the Practitioner; and/or  . In rare instances, security protocols could fail, causing a breach of personal health information.  Furthermore, I acknowledge that it is my responsibility to provide information about my medical history, conditions and care that is complete and accurate to the best of my ability. I acknowledge that Practitioner's advice, recommendations, and/or decision may be based on factors not  within their control, such as incomplete or inaccurate data provided by me or distortions of diagnostic images or specimens that may result from electronic transmissions. I understand that the practice of medicine is not an exact science and that Practitioner makes no warranties or guarantees regarding treatment outcomes. I acknowledge that I will receive a copy of this consent concurrently upon execution via email to the email address I last provided but may also request a printed copy by calling the office of [facility].    I understand that my insurance will be billed for this visit.   I have read or had this consent read to me. . I understand the contents of this consent, which adequately explains the benefits and risks of the Services being provided via telemedicine.  . I have been provided ample opportunity to ask questions regarding this consent and the Services and have had my questions answered to my satisfaction. . I give my informed consent for the services to be provided through the use of telemedicine in my medical care  By participating in this telemedicine visit I agree to the above.  

## 2018-10-22 ENCOUNTER — Telehealth (INDEPENDENT_AMBULATORY_CARE_PROVIDER_SITE_OTHER): Payer: Medicare Other | Admitting: Interventional Cardiology

## 2018-10-22 ENCOUNTER — Other Ambulatory Visit: Payer: Self-pay

## 2018-10-22 ENCOUNTER — Encounter: Payer: Self-pay | Admitting: Interventional Cardiology

## 2018-10-22 VITALS — BP 138/98 | HR 68 | Ht 71.0 in

## 2018-10-22 DIAGNOSIS — I480 Paroxysmal atrial fibrillation: Secondary | ICD-10-CM | POA: Diagnosis not present

## 2018-10-22 DIAGNOSIS — E7849 Other hyperlipidemia: Secondary | ICD-10-CM

## 2018-10-22 DIAGNOSIS — Z952 Presence of prosthetic heart valve: Secondary | ICD-10-CM | POA: Diagnosis not present

## 2018-10-22 DIAGNOSIS — N183 Chronic kidney disease, stage 3 unspecified: Secondary | ICD-10-CM

## 2018-10-22 DIAGNOSIS — C641 Malignant neoplasm of right kidney, except renal pelvis: Secondary | ICD-10-CM

## 2018-10-22 DIAGNOSIS — I5042 Chronic combined systolic (congestive) and diastolic (congestive) heart failure: Secondary | ICD-10-CM

## 2018-10-22 DIAGNOSIS — Z7189 Other specified counseling: Secondary | ICD-10-CM

## 2018-10-22 DIAGNOSIS — I10 Essential (primary) hypertension: Secondary | ICD-10-CM

## 2018-10-22 MED ORDER — AMLODIPINE BESYLATE 2.5 MG PO TABS: 2.5000 mg | ORAL_TABLET | Freq: Every day | ORAL | 3 refills | Status: DC

## 2018-10-22 NOTE — Patient Instructions (Signed)
Medication Instructions:  1) START Amlodipine 2.5mg  once daily  If you need a refill on your cardiac medications before your next appointment, please call your pharmacy.   Lab work: None If you have labs (blood work) drawn today and your tests are completely normal, you will receive your results only by: Marland Kitchen MyChart Message (if you have MyChart) OR . A paper copy in the mail If you have any lab test that is abnormal or we need to change your treatment, we will call you to review the results.  Testing/Procedures: None  Follow-Up: At [facility], you and your health needs are our priority.  As part of our continuing mission to provide you with exceptional heart care, we have created designated Provider Care Teams.  These Care Teams include your primary Cardiologist (physician) and Advanced Practice Providers (APPs -  Physician Assistants and Nurse Practitioners) who all work together to provide you with the care you need, when you need it. You will need a follow up appointment in 7-8 months.  Please call our office 2 months in advance to schedule this appointment.  You may see Dr. Tamala Julian or one of the following Advanced Practice Providers on your designated Care Team:   Truitt Merle, NP Cecilie Kicks, NP . Kathyrn Drown, NP  Any Other Special Instructions Will Be Listed Below (If Applicable).

## 2018-10-24 ENCOUNTER — Ambulatory Visit: Payer: Medicare Other | Admitting: Interventional Cardiology

## 2019-01-01 ENCOUNTER — Ambulatory Visit: Payer: Medicare Other

## 2019-01-05 ENCOUNTER — Encounter: Payer: Medicare Other | Admitting: Family Medicine

## 2019-01-07 DIAGNOSIS — H25093 Other age-related incipient cataract, bilateral: Secondary | ICD-10-CM | POA: Diagnosis not present

## 2019-01-08 ENCOUNTER — Encounter: Payer: Self-pay | Admitting: Family Medicine

## 2019-01-08 ENCOUNTER — Telehealth: Payer: Self-pay | Admitting: Family Medicine

## 2019-01-08 ENCOUNTER — Ambulatory Visit (INDEPENDENT_AMBULATORY_CARE_PROVIDER_SITE_OTHER): Payer: Medicare Other | Admitting: Family Medicine

## 2019-01-08 ENCOUNTER — Other Ambulatory Visit: Payer: Self-pay

## 2019-01-08 ENCOUNTER — Encounter

## 2019-01-08 VITALS — BP 146/96 | HR 77 | Temp 98.2°F | Ht 70.0 in | Wt 206.4 lb

## 2019-01-08 DIAGNOSIS — Z952 Presence of prosthetic heart valve: Secondary | ICD-10-CM | POA: Diagnosis not present

## 2019-01-08 DIAGNOSIS — Z7189 Other specified counseling: Secondary | ICD-10-CM | POA: Insufficient documentation

## 2019-01-08 DIAGNOSIS — E7849 Other hyperlipidemia: Secondary | ICD-10-CM

## 2019-01-08 DIAGNOSIS — I480 Paroxysmal atrial fibrillation: Secondary | ICD-10-CM

## 2019-01-08 DIAGNOSIS — Z125 Encounter for screening for malignant neoplasm of prostate: Secondary | ICD-10-CM | POA: Diagnosis not present

## 2019-01-08 DIAGNOSIS — Z8679 Personal history of other diseases of the circulatory system: Secondary | ICD-10-CM

## 2019-01-08 DIAGNOSIS — Z85528 Personal history of other malignant neoplasm of kidney: Secondary | ICD-10-CM | POA: Diagnosis not present

## 2019-01-08 DIAGNOSIS — Z0000 Encounter for general adult medical examination without abnormal findings: Secondary | ICD-10-CM | POA: Diagnosis not present

## 2019-01-08 DIAGNOSIS — I10 Essential (primary) hypertension: Secondary | ICD-10-CM

## 2019-01-08 DIAGNOSIS — Z9889 Other specified postprocedural states: Secondary | ICD-10-CM

## 2019-01-08 DIAGNOSIS — N183 Chronic kidney disease, stage 3 unspecified: Secondary | ICD-10-CM

## 2019-01-08 DIAGNOSIS — Z1211 Encounter for screening for malignant neoplasm of colon: Secondary | ICD-10-CM

## 2019-01-08 DIAGNOSIS — Z23 Encounter for immunization: Secondary | ICD-10-CM | POA: Diagnosis not present

## 2019-01-08 LAB — COMPREHENSIVE METABOLIC PANEL
ALT: 307 U/L — ABNORMAL HIGH (ref 0–53)
AST: 210 U/L — ABNORMAL HIGH (ref 0–37)
Albumin: 4.1 g/dL (ref 3.5–5.2)
Alkaline Phosphatase: 72 U/L (ref 39–117)
BUN: 22 mg/dL (ref 6–23)
CO2: 24 mEq/L (ref 19–32)
Calcium: 9 mg/dL (ref 8.4–10.5)
Chloride: 104 mEq/L (ref 96–112)
Creatinine, Ser: 1.17 mg/dL (ref 0.40–1.50)
GFR: 62.3 mL/min (ref >60.00)
Glucose, Bld: 96 mg/dL (ref 70–99)
Potassium: 4.7 mEq/L (ref 3.5–5.1)
Sodium: 135 mEq/L (ref 135–145)
Total Bilirubin: 0.8 mg/dL (ref 0.2–1.2)
Total Protein: 7.9 g/dL (ref 6.0–8.3)

## 2019-01-08 LAB — LIPID PANEL
Cholesterol: 176 mg/dL (ref 0–200)
HDL: 24.8 mg/dL — ABNORMAL LOW (ref >39.00)
LDL Cholesterol: 118 mg/dL — ABNORMAL HIGH (ref 0–99)
NonHDL: 150.88
Total CHOL/HDL Ratio: 7
Triglycerides: 164 mg/dL — ABNORMAL HIGH (ref 0.0–149.0)
VLDL: 32.8 mg/dL (ref 0.0–40.0)

## 2019-01-08 LAB — CBC WITH DIFFERENTIAL/PLATELET
Basophils Absolute: 0 10*3/uL (ref 0.0–0.1)
Basophils Relative: 0.4 % (ref 0.0–3.0)
Eosinophils Absolute: 0.1 10*3/uL (ref 0.0–0.7)
Eosinophils Relative: 0.9 % (ref 0.0–5.0)
HCT: 40.8 % (ref 39.0–52.0)
Hemoglobin: 13.7 g/dL (ref 13.0–17.0)
Lymphocytes Relative: 32.8 % (ref 12.0–46.0)
Lymphs Abs: 2 10*3/uL (ref 0.7–4.0)
MCHC: 33.7 g/dL (ref 30.0–36.0)
MCV: 94.5 fl (ref 78.0–100.0)
Monocytes Absolute: 0.7 10*3/uL (ref 0.1–1.0)
Monocytes Relative: 10.9 % (ref 3.0–12.0)
Neutro Abs: 3.3 10*3/uL (ref 1.4–7.7)
Neutrophils Relative %: 55 % (ref 43.0–77.0)
Platelets: 154 10*3/uL (ref 150.0–400.0)
RBC: 4.31 Mil/uL (ref 4.22–5.81)
RDW: 13.6 % (ref 11.5–15.5)
WBC: 6.1 10*3/uL (ref 4.0–10.5)

## 2019-01-08 LAB — PSA, MEDICARE: PSA: 1.24 ng/ml (ref 0.10–4.00)

## 2019-01-08 LAB — VITAMIN D 25 HYDROXY (VIT D DEFICIENCY, FRACTURES): VITD: 40.9 ng/mL (ref 30.00–100.00)

## 2019-01-08 LAB — TSH: TSH: 1.88 u[IU]/mL (ref 0.35–4.50)

## 2019-01-08 NOTE — Assessment & Plan Note (Signed)
Update labs. Regularly sees renal in h/o RCC s/p nephrectomy.

## 2019-01-08 NOTE — Assessment & Plan Note (Signed)

## 2019-01-08 NOTE — Patient Instructions (Addendum)
We will refer you back to Dr Kennieth Francois for repeat colonoscopy.  Pneumovax 23 today (final pneumonia shot).  If interested, check with pharmacy about new 2 shot shingles series (shingrix).  Bring me a copy of your advanced directives to update chart.  Blood pressure is staying elevated - I will send note to Dr Tamala Julian.   Health Maintenance After Age 66 After age 48, you are at a higher risk for certain long-term diseases and infections as well as injuries from falls. Falls are a major cause of broken bones and head injuries in people who are older than age 86. Getting regular preventive care can help to keep you healthy and well. Preventive care includes getting regular testing and making lifestyle changes as recommended by your health care provider. Talk with your health care provider about:  Which screenings and tests you should have. A screening is a test that checks for a disease when you have no symptoms.  A diet and exercise plan that is right for you. What should I know about screenings and tests to prevent falls? Screening and testing are the best ways to find a health problem early. Early diagnosis and treatment give you the best chance of managing medical conditions that are common after age 61. Certain conditions and lifestyle choices may make you more likely to have a fall. Your health care provider may recommend:  Regular vision checks. Poor vision and conditions such as cataracts can make you more likely to have a fall. If you wear glasses, make sure to get your prescription updated if your vision changes.  Medicine review. Work with your health care provider to regularly review all of the medicines you are taking, including over-the-counter medicines. Ask your health care provider about any side effects that may make you more likely to have a fall. Tell your health care provider if any medicines that you take make you feel dizzy or sleepy.  Osteoporosis screening. Osteoporosis is a  condition that causes the bones to get weaker. This can make the bones weak and cause them to break more easily.  Blood pressure screening. Blood pressure changes and medicines to control blood pressure can make you feel dizzy.  Strength and balance checks. Your health care provider may recommend certain tests to check your strength and balance while standing, walking, or changing positions.  Foot health exam. Foot pain and numbness, as well as not wearing proper footwear, can make you more likely to have a fall.  Depression screening. You may be more likely to have a fall if you have a fear of falling, feel emotionally low, or feel unable to do activities that you used to do.  Alcohol use screening. Using too much alcohol can affect your balance and may make you more likely to have a fall. What actions can I take to lower my risk of falls? General instructions  Talk with your health care provider about your risks for falling. Tell your health care provider if: ? You fall. Be sure to tell your health care provider about all falls, even ones that seem minor. ? You feel dizzy, sleepy, or off-balance.  Take over-the-counter and prescription medicines only as told by your health care provider. These include any supplements.  Eat a healthy diet and maintain a healthy weight. A healthy diet includes low-fat dairy products, low-fat (lean) meats, and fiber from whole grains, beans, and lots of fruits and vegetables. Home safety  Remove any tripping hazards, such as rugs, cords, and clutter.  Install safety equipment such as grab bars in bathrooms and safety rails on stairs.  Keep rooms and walkways well-lit. Activity   Follow a regular exercise program to stay fit. This will help you maintain your balance. Ask your health care provider what types of exercise are appropriate for you.  If you need a cane or walker, use it as recommended by your health care provider.  Wear supportive shoes  that have nonskid soles. Lifestyle  Do not drink alcohol if your health care provider tells you not to drink.  If you drink alcohol, limit how much you have: ? 0-1 drink a day for women. ? 0-2 drinks a day for men.  Be aware of how much alcohol is in your drink. In the [place] one drink equals one typical bottle of beer (12 oz), one-half glass of wine (5 oz), or one shot of hard liquor (1 oz).  Do not use any products that contain nicotine or tobacco, such as cigarettes and e-cigarettes. If you need help quitting, ask your health care provider. Summary  Having a healthy lifestyle and getting preventive care can help to protect your health and wellness after age 57.  Screening and testing are the best way to find a health problem early and help you avoid having a fall. Early diagnosis and treatment give you the best chance for managing medical conditions that are more common for people who are older than age 105.  Falls are a major cause of broken bones and head injuries in people who are older than age 66. Take precautions to prevent a fall at home.  Work with your health care provider to learn what changes you can make to improve your health and wellness and to prevent falls. This information is not intended to replace advice given to you by your health care provider. Make sure you discuss any questions you have with your health care provider. Document Released: 05/01/2017 Document Revised: 10/09/2018 Document Reviewed: 05/01/2017 Elsevier Patient Education  2020 [employer].

## 2019-01-08 NOTE — Assessment & Plan Note (Signed)
Sounds regular today. Only on aspirin 81mg  daily.

## 2019-01-08 NOTE — Progress Notes (Signed)
This visit was conducted in person.  BP (!) 146/96 (BP Location: Right Arm, Cuff Size: Normal)   Pulse 77   Temp 98.2 F (36.8 C) (Tympanic)   Ht 5\' 10"  (1.778 m)   Wt 206 lb 7 oz (93.6 kg)   SpO2 97%   BMI 29.62 kg/m    CC: AMW Subjective:    Patient ID: Aaron Cole, male    DOB: Nov 29, 1950, 68 y.o.   MRN: [id number]  HPI: Aaron Cole is a 68 y.o. male presenting on 01/08/2019 for Medicare Wellness   Did not see Katha Cabal this year.  Hearing Screening   125Hz  250Hz  500Hz  1000Hz  2000Hz  3000Hz  4000Hz  6000Hz  8000Hz   Right ear:   25 25 25   0    Left ear:   20 20 20   40    Vision Screening Comments: Last eye exam, 01/07/19 Passes fall and depression screens.   Had Bentall procedure 07/2015 - ascending aortic aneurysm repair with AVR ([place]).  S/p R nephrectomy for renal cell cancer (2013). Sees Tawni Levy NP at [facility] nephrology.  Amlodipine 2.5mg  daily added 10/2018. Goal BP <130/80. Has intermittently been checking BP at home - latest check 134/92.   Preventative: COLONOSCOPY 11/2008 WNL records in chart Allen Norris).  Prostate cancer screening -discussed, would like PSA, declines DRE today Lung cancer screening - not eligible AAA screen - not eligible. Fluyearly Tetanus - 08/2012 prevnar - 12/2017, pneumovax today shingrix - discussed, pt will look into local pharmacy. Advanced directive: has set up at home. HCPOA is aunt June Reece. Asked to bring copy for chart. Seat belt use discussed.  Sunscreen use discussed.No changing moles on skin.  Non smoker Alcohol - rarely on weekends Dentist - Q6 mo  Eye exam - yearly Bowel - no constipation Bladder - no incontinence  Caffeine: occasional  Lives alone, 1 cat  Occupation: farming business  Edu: BS  Activity:planning on restarting stationary bicycle Diet: good water, fruits/vegetables daily, steak 1x/wk, fish 2x/wk     Relevant past medical, surgical, family and social history reviewed and updated as indicated.  Interim medical history since our last visit reviewed. Allergies and medications reviewed and updated. Outpatient Medications Prior to Visit  Medication Sig Dispense Refill  . amLODipine (NORVASC) 2.5 MG tablet Take 1 tablet (2.5 mg total) by mouth daily. 90 tablet 3  . aspirin 81 MG tablet Take 81 mg by mouth daily.    . fluticasone (FLONASE) 50 MCG/ACT nasal spray INHALE 2 SPRAYS INTO EACH NOSTRIL EVERY DAY. 16 g 3  . Multiple Vitamins-Minerals (MULTIVITAMIN ADULT PO) Take 1 tablet by mouth daily.     . Omega-3 Fatty Acids (FISH OIL) 1000 MG CAPS Take 1 capsule (1,000 mg total) by mouth daily.  0  . sildenafil (VIAGRA) 100 MG tablet Take 0.5-1 tablets (50-100 mg total) by mouth daily as needed for erectile dysfunction. 10 tablet 6  . telmisartan (MICARDIS) 80 MG tablet TAKE ONE-HALF (1/2) OF A TABLET BY MOUTH TWICE DAILY. 90 tablet 3   No facility-administered medications prior to visit.      Per HPI unless specifically indicated in ROS section below Review of Systems Objective:    BP (!) 146/96 (BP Location: Right Arm, Cuff Size: Normal)   Pulse 77   Temp 98.2 F (36.8 C) (Tympanic)   Ht 5\' 10"  (1.778 m)   Wt 206 lb 7 oz (93.6 kg)   SpO2 97%   BMI 29.62 kg/m   Wt Readings from Last 3 Encounters:  01/08/19 206 lb 7 oz (93.6 kg)  12/31/17 210 lb 12.8 oz (95.6 kg)  12/30/17 209 lb (94.8 kg)    Physical Exam Vitals signs and nursing note reviewed.  Constitutional:      General: He is not in acute distress.    Appearance: He is well-developed.  HENT:     Head: Normocephalic and atraumatic.     Right Ear: Hearing, tympanic membrane, ear canal and external ear normal.     Left Ear: Hearing, tympanic membrane, ear canal and external ear normal.     Nose: Nose normal.     Mouth/Throat:     Pharynx: Uvula midline. No oropharyngeal exudate or posterior oropharyngeal erythema.  Eyes:     General: No scleral icterus.    Conjunctiva/sclera: Conjunctivae normal.     Pupils: Pupils  are equal, round, and reactive to light.  Neck:     Musculoskeletal: Normal range of motion and neck supple.  Cardiovascular:     Rate and Rhythm: Normal rate and regular rhythm.     Pulses: Normal pulses.          Radial pulses are 2+ on the right side and 2+ on the left side.     Heart sounds: Murmur (soft mechanical systolic) present.  Pulmonary:     Effort: Pulmonary effort is normal. No respiratory distress.     Breath sounds: Normal breath sounds. No wheezing, rhonchi or rales.  Abdominal:     General: Bowel sounds are normal. There is no distension.     Palpations: Abdomen is soft. There is no mass.     Tenderness: There is no abdominal tenderness. There is no guarding or rebound.  Musculoskeletal: Normal range of motion.  Lymphadenopathy:     Cervical: No cervical adenopathy.  Skin:    General: Skin is warm and dry.     Findings: No rash.  Neurological:     General: No focal deficit present.     Mental Status: He is alert and oriented to person, place, and time.     Comments:  CN grossly intact, station and gait intact Recall 2/3, 2/3 wit hcue Calculation 4/5 serial 7s  Psychiatric:        Mood and Affect: Mood normal.        Behavior: Behavior normal.        Thought Content: Thought content normal.        Judgment: Judgment normal.       Results for orders placed or performed in visit on 12/23/17  VITAMIN D 25 Hydroxy (Vit-D Deficiency, Fractures)  Result Value Ref Range   VITD 41.97 30.00 - 100.00 ng/mL  Renal function panel  Result Value Ref Range   Sodium 138 135 - 145 mEq/L   Potassium 4.4 3.5 - 5.1 mEq/L   Chloride 105 96 - 112 mEq/L   CO2 25 19 - 32 mEq/L   Calcium 9.4 8.4 - 10.5 mg/dL   Albumin 4.0 3.5 - 5.2 g/dL   BUN 23 6 - 23 mg/dL   Creatinine, Ser 1.26 0.40 - 1.50 mg/dL   Glucose, Bld 110 (H) 70 - 99 mg/dL   Phosphorus 3.3 2.3 - 4.6 mg/dL   GFR 60.98 >60.00 mL/min  CBC with Differential/Platelet  Result Value Ref Range   WBC 5.6 4.0 - 10.5  K/uL   RBC 4.82 4.22 - 5.81 Mil/uL   Hemoglobin 15.0 13.0 - 17.0 g/dL   HCT 43.7 39.0 - 52.0 %   MCV 90.8 78.0 -  100.0 fl   MCHC 34.2 30.0 - 36.0 g/dL   RDW 14.3 11.5 - 15.5 %   Platelets 168.0 150.0 - 400.0 K/uL   Neutrophils Relative % 46.8 43.0 - 77.0 %   Lymphocytes Relative 39.6 12.0 - 46.0 %   Monocytes Relative 11.7 3.0 - 12.0 %   Eosinophils Relative 1.2 0.0 - 5.0 %   Basophils Relative 0.7 0.0 - 3.0 %   Neutro Abs 2.6 1.4 - 7.7 K/uL   Lymphs Abs 2.2 0.7 - 4.0 K/uL   Monocytes Absolute 0.7 0.1 - 1.0 K/uL   Eosinophils Absolute 0.1 0.0 - 0.7 K/uL   Basophils Absolute 0.0 0.0 - 0.1 K/uL  PSA  Result Value Ref Range   PSA 1.34 0.10 - 4.00 ng/mL  Lipid panel  Result Value Ref Range   Cholesterol 193 0 - 200 mg/dL   Triglycerides 166.0 (H) 0.0 - 149.0 mg/dL   HDL 31.10 (L) >39.00 mg/dL   VLDL 33.2 0.0 - 40.0 mg/dL   LDL Cholesterol 128 (H) 0 - 99 mg/dL   Total CHOL/HDL Ratio 6    NonHDL 161.56   Microalbumin / creatinine urine ratio  Result Value Ref Range   Microalb, Ur <0.7 0.0 - 1.9 mg/dL   Creatinine,U 114.4 mg/dL   Microalb Creat Ratio 0.6 0.0 - 30.0 mg/g   Assessment & Plan:   Problem List Items Addressed This Visit    S/P ascending aortic aneurysm repair   S/P aortic valve replacement   Medicare annual wellness visit, initial - Primary    I have personally reviewed the Medicare Annual Wellness questionnaire and have noted 1. The patient's medical and social history 2. Their use of alcohol, tobacco or illicit drugs 3. Their current medications and supplements 4. The patient's functional ability including ADL's, fall risks, home safety risks and hearing or visual impairment. Cognitive function has been assessed and addressed as indicated.  5. Diet and physical activity 6. Evidence for depression or mood disorders The patients weight, height, BMI have been recorded in the chart. I have made referrals, counseling and provided education to the patient based on  review of the above and I have provided the pt with a written personalized care plan for preventive services. Provider list updated.. See scanned questionairre as needed for further documentation. Reviewed preventative protocols and updated unless pt declined.       HTN (hypertension)    Chronic, elevated despite amlodipine. Will send recent BP to cardiology for titration through their office per pt request. Anticipate amlodipine will be increased. He will start checking BP more regularly at home as well.       HLD (hyperlipidemia)    Chronic, update labs off statin. The 10-year ASCVD risk score Mikey Bussing DC Brooke Bonito., et al., 2013) is: 24.5%   Values used to calculate the score:     Age: 37 years     Sex: Male     Is Non-Hispanic African American: No     Diabetic: No     Tobacco smoker: No     Systolic Blood Pressure: 073 mmHg     Is BP treated: Yes     HDL Cholesterol: 31.1 mg/dL     Total Cholesterol: 193 mg/dL       Relevant Orders   Lipid panel   Comprehensive metabolic panel   TSH   History of renal cell carcinoma   CKD (chronic kidney disease) stage 3, GFR 30-59 ml/min (HCC)    Update labs. Regularly sees  renal in h/o RCC s/p nephrectomy.       Relevant Orders   Comprehensive metabolic panel   TSH   CBC with Differential/Platelet   VITAMIN D 25 Hydroxy (Vit-D Deficiency, Fractures)   Atrial fibrillation ([place]) [I48.91]    Sounds regular today. Only on aspirin 81mg  daily.      Advanced care planning/counseling discussion    Advanced directive: has set up at home. HCPOA is aunt June Reece. Asked to bring copy for chart.       Other Visit Diagnoses    Special screening for malignant neoplasms, colon       Relevant Orders   Ambulatory referral to Gastroenterology   Special screening for malignant neoplasm of prostate       Relevant Orders   PSA, Medicare   Need for 23-polyvalent pneumococcal polysaccharide vaccine       Relevant Orders   Pneumococcal polysaccharide  vaccine 23-valent greater than or equal to 66yo subcutaneous/IM (Completed)       No orders of the defined types were placed in this encounter.  Orders Placed This Encounter  Procedures  . Pneumococcal polysaccharide vaccine 23-valent greater than or equal to 66yo subcutaneous/IM  . Lipid panel  . Comprehensive metabolic panel  . TSH  . PSA, Medicare  . CBC with Differential/Platelet  . VITAMIN D 25 Hydroxy (Vit-D Deficiency, Fractures)  . Ambulatory referral to Gastroenterology    Referral Priority:   Routine    Referral Type:   Consultation    Referral Reason:   Specialty Services Required    Number of Visits Requested:   1    Follow up plan: Return in about 1 year (around 01/08/2020) for medicare wellness visit.  [name], MD

## 2019-01-08 NOTE — Assessment & Plan Note (Signed)
Chronic, elevated despite amlodipine. Will send recent BP to cardiology for titration through their office per pt request. Anticipate amlodipine will be increased. He will start checking BP more regularly at home as well.

## 2019-01-08 NOTE — Assessment & Plan Note (Signed)
Advanced directive: has set up at home. HCPOA is aunt Aaron Cole. Asked to bring copy for chart.

## 2019-01-08 NOTE — Telephone Encounter (Signed)
I saw Aaron Cole today for medicare wellness visit. He and I wanted to let you know about his elevated BP in office - 140/86, on recheck 146/96. He is compliant with amlodipine 2.5mg  daily and telmisartan 80mg  1/2 tab BID.  He has not been regularly checking BP routinely at home, but will start checking blood pressures daily, and did want antihypertensive titration, if necessary, done through your office.  Thanks.

## 2019-01-08 NOTE — Assessment & Plan Note (Signed)
Chronic, update labs off statin. The 10-year ASCVD risk score Mikey Bussing DC Brooke Bonito., et al., 2013) is: 24.5%   Values used to calculate the score:     Age: 68 years     Sex: Male     Is Non-Hispanic African American: No     Diabetic: No     Tobacco smoker: No     Systolic Blood Pressure: 209 mmHg     Is BP treated: Yes     HDL Cholesterol: 31.1 mg/dL     Total Cholesterol: 193 mg/dL

## 2019-01-10 ENCOUNTER — Other Ambulatory Visit: Payer: Self-pay | Admitting: Family Medicine

## 2019-01-10 DIAGNOSIS — K7460 Unspecified cirrhosis of liver: Secondary | ICD-10-CM

## 2019-01-10 DIAGNOSIS — R7401 Elevation of levels of liver transaminase levels: Secondary | ICD-10-CM

## 2019-01-10 DIAGNOSIS — Z85528 Personal history of other malignant neoplasm of kidney: Secondary | ICD-10-CM

## 2019-01-10 HISTORY — DX: Unspecified cirrhosis of liver: K74.60

## 2019-01-19 ENCOUNTER — Ambulatory Visit
Admission: RE | Admit: 2019-01-19 | Discharge: 2019-01-19 | Disposition: A | Discharge status: Home / Self Care | Payer: Medicare Other | Source: Ambulatory Visit | Attending: Family Medicine | Admitting: Family Medicine

## 2019-01-19 ENCOUNTER — Other Ambulatory Visit: Payer: Self-pay

## 2019-01-19 DIAGNOSIS — Z85528 Personal history of other malignant neoplasm of kidney: Secondary | ICD-10-CM

## 2019-01-19 DIAGNOSIS — R740 Nonspecific elevation of levels of transaminase and lactic acid dehydrogenase [LDH]: Secondary | ICD-10-CM | POA: Diagnosis not present

## 2019-01-19 DIAGNOSIS — K8020 Calculus of gallbladder without cholecystitis without obstruction: Secondary | ICD-10-CM | POA: Diagnosis not present

## 2019-01-19 DIAGNOSIS — R7401 Elevation of levels of liver transaminase levels: Secondary | ICD-10-CM

## 2019-01-21 ENCOUNTER — Telehealth: Payer: Self-pay

## 2019-01-21 ENCOUNTER — Other Ambulatory Visit: Payer: Self-pay | Admitting: Family Medicine

## 2019-01-21 ENCOUNTER — Ambulatory Visit: Payer: Medicare Other

## 2019-01-21 DIAGNOSIS — R7401 Elevation of levels of liver transaminase levels: Secondary | ICD-10-CM

## 2019-01-21 NOTE — Telephone Encounter (Signed)
Notes recorded by [name], MD on 01/21/2019 at 8:15 AM EDT  plz notify us returned ok, just showing small gallstones, let us know if any RUQ abd pain develops.  I would like for him to return for repeat labwork to recheck liver function in the next week at his convenience. Labs ordered.   Pt scheduled for 7/29 at 745am  Terry in lab explained to patient the lab processes.  Nothing further needed.

## 2019-01-21 NOTE — Telephone Encounter (Signed)
Pt left v/m requesting cb with results from [place] abd done on 01/19/19.

## 2019-01-28 ENCOUNTER — Other Ambulatory Visit: Payer: Self-pay

## 2019-01-28 ENCOUNTER — Other Ambulatory Visit (INDEPENDENT_AMBULATORY_CARE_PROVIDER_SITE_OTHER): Payer: Medicare Other

## 2019-01-28 DIAGNOSIS — R740 Nonspecific elevation of levels of transaminase and lactic acid dehydrogenase [LDH]: Secondary | ICD-10-CM

## 2019-01-28 DIAGNOSIS — R7401 Elevation of levels of liver transaminase levels: Secondary | ICD-10-CM

## 2019-01-28 LAB — HEPATIC FUNCTION PANEL
ALT: 410 U/L — ABNORMAL HIGH (ref 0–53)
AST: 267 U/L — ABNORMAL HIGH (ref 0–37)
Albumin: 3.8 g/dL (ref 3.5–5.2)
Alkaline Phosphatase: 73 U/L (ref 39–117)
Bilirubin, Direct: 0.2 mg/dL (ref 0.0–0.3)
Total Bilirubin: 0.9 mg/dL (ref 0.2–1.2)
Total Protein: 7.8 g/dL (ref 6.0–8.3)

## 2019-01-28 LAB — FERRITIN: Ferritin: 172.2 ng/mL (ref 22.0–322.0)

## 2019-01-29 ENCOUNTER — Other Ambulatory Visit: Payer: Self-pay | Admitting: Family Medicine

## 2019-01-29 DIAGNOSIS — B1920 Unspecified viral hepatitis C without hepatic coma: Secondary | ICD-10-CM

## 2019-01-29 DIAGNOSIS — R7401 Elevation of levels of liver transaminase levels: Secondary | ICD-10-CM

## 2019-02-05 LAB — HEPATITIS PANEL, ACUTE
Hep A IgM: NONREACTIVE
Hep B C IgM: NONREACTIVE
Hepatitis B Surface Ag: NONREACTIVE
Hepatitis C Ab: REACTIVE — AB
SIGNAL TO CUT-OFF: 33 — ABNORMAL HIGH (ref <1.00)

## 2019-02-05 LAB — HCV RNA,QUANTITATIVE REAL TIME PCR
HCV Quantitative Log: 6.44 Log IU/mL — ABNORMAL HIGH
HCV RNA, PCR, QN: 2750000 IU/mL — ABNORMAL HIGH

## 2019-02-10 ENCOUNTER — Telehealth: Payer: Self-pay

## 2019-02-10 ENCOUNTER — Other Ambulatory Visit: Payer: Self-pay

## 2019-02-10 DIAGNOSIS — Z1211 Encounter for screening for malignant neoplasm of colon: Secondary | ICD-10-CM

## 2019-02-10 DIAGNOSIS — Z8371 Family history of colonic polyps: Secondary | ICD-10-CM

## 2019-02-10 NOTE — Telephone Encounter (Signed)
Gastroenterology Pre-Procedure Review  Request Date: 02/24/19 Requesting Physician: Dr. Allen Norris  PATIENT REVIEW QUESTIONS: The patient responded to the following health history questions as indicated:    1. Are you having any GI issues? no 2. Do you have a personal history of Polyps? no 3. Do you have a family history of Colon Cancer or Polyps? yes (Brother Polyps) 4. Diabetes Mellitus? no 5. Joint replacements in the past 12 months?no 6. Major health problems in the past 3 months?no 7. Any artificial heart valves, MVP, or defibrillator?no    MEDICATIONS & ALLERGIES:    Patient reports the following regarding taking any anticoagulation/antiplatelet therapy:   Plavix, Coumadin, Eliquis, Xarelto, Lovenox, Pradaxa, Brilinta, or Effient? no Aspirin? yes (81 mg daily)  Patient confirms/reports the following medications:  Current Outpatient Medications  Medication Sig Dispense Refill  . amLODipine (NORVASC) 2.5 MG tablet Take 1 tablet (2.5 mg total) by mouth daily. 90 tablet 3  . aspirin 81 MG tablet Take 81 mg by mouth daily.    . fluticasone (FLONASE) 50 MCG/ACT nasal spray INHALE 2 SPRAYS INTO EACH NOSTRIL EVERY DAY. 16 g 3  . Multiple Vitamins-Minerals (MULTIVITAMIN ADULT PO) Take 1 tablet by mouth daily.     . Omega-3 Fatty Acids (FISH OIL) 1000 MG CAPS Take 1 capsule (1,000 mg total) by mouth daily.  0  . sildenafil (VIAGRA) 100 MG tablet Take 0.5-1 tablets (50-100 mg total) by mouth daily as needed for erectile dysfunction. 10 tablet 6  . telmisartan (MICARDIS) 80 MG tablet TAKE ONE-HALF (1/2) OF A TABLET BY MOUTH TWICE DAILY. 90 tablet 3   No current facility-administered medications for this visit.     Patient confirms/reports the following allergies:  Allergies  Allergen Reactions  . Carvedilol Other (See Comments)    Blurred vision, drowsiness    No orders of the defined types were placed in this encounter.   AUTHORIZATION INFORMATION Primary Insurance: 1D#: Group  #:  Secondary Insurance: 1D#: Group #:  SCHEDULE INFORMATION: Date: 02/24/19 Time: Location:[facility]

## 2019-02-11 ENCOUNTER — Other Ambulatory Visit: Payer: Self-pay | Admitting: Nurse Practitioner

## 2019-02-16 ENCOUNTER — Ambulatory Visit (INDEPENDENT_AMBULATORY_CARE_PROVIDER_SITE_OTHER): Payer: Medicare Other | Admitting: Gastroenterology

## 2019-02-16 ENCOUNTER — Other Ambulatory Visit: Payer: Self-pay

## 2019-02-16 VITALS — BP 141/94 | HR 63 | Temp 98.1°F | Ht 70.0 in | Wt 206.0 lb

## 2019-02-16 DIAGNOSIS — R945 Abnormal results of liver function studies: Secondary | ICD-10-CM | POA: Diagnosis not present

## 2019-02-16 DIAGNOSIS — B1920 Unspecified viral hepatitis C without hepatic coma: Secondary | ICD-10-CM | POA: Diagnosis not present

## 2019-02-16 DIAGNOSIS — R7989 Other specified abnormal findings of blood chemistry: Secondary | ICD-10-CM

## 2019-02-16 NOTE — Progress Notes (Signed)
[name] MD, MRCP(U.K) [address]  [place]  [place], [place] [address]  Main: [phone]  Fax: [phone]   Gastroenterology Consultation  Referring Provider:     [name], MD Primary Care Physician:  [name], MD Primary Gastroenterologist:  Dr. [name]  Reason for Consultation:     Hepatitis C        HPI:   Aaron Cole is a 68 y.o. y/o male referred for consultation & management  by Dr. [name], MD.    Recent CMP performed on 01/08/2019 demonstrated elevation of the AST and ALT at 210 and 307 respectively.  Albumin of 4.1.  And normal total bilirubin and creatinine.  Sh was normal.  Hemoglobin 13.7 g with a platelet count 254.  Hepatic function panel repeated 2 weeks back showed that the AST and ALT are further raised at 267 and 410 respectively.  Written normal at 172 acute hepatitis panel was negative for acute hepatitis a and B.  Hepatitis C viral antibody was positive.  Viral load of 2.75 million  01/19/2019 right upper quadrant ultrasound showed cholelithiasis.  Status post right nephrectomy.  Denies any prior blood transfusions, tattoos, [employer], illegal drug use, excess alcohol use.  He did admit that he uses certain sex toys and he is pretty sure there was some exchange of blood during the usage.  Never been treated for hepatitis C.  Only recently found that that he had hepatitis C.  He does recall that he was checked a year prior and was negative for hepatitis C.  Past Medical History:  Diagnosis Date  . CAD (coronary artery disease) 2013   per CT scan  - brings CD of CT, no report - will need [facility] records of CT scan report  . CKD (chronic kidney disease) stage 3, GFR 30-59 ml/min ([place]) 2015   iatrogenic (decreased nephron mass) established with Dr. Alfonse Alpers [facility])  . Diverticulosis    by CT scan  . HTN (hypertension)   . Renal cell carcinoma ([place]) 2013   clear cell s/p R nephrectomy  . S/p nephrectomy 2013   [facility]   . Sinus headache    recurrent    Past Surgical History:  Procedure Laterality Date  . BENTALL PROCEDURE N/A 07/05/2015   Procedure: BENTALL PROCEDURE UTILIZING A 28 X 10 MM WOVEN DOUBLE VELOUR VASCULAR GRAFT AND A 28MM GELWEAVE WOVEN VASCULAR GRAFT. 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.;  Surgeon: [name], MD;  Location: [place] [facility];  Service: Open Heart Surgery;  Laterality: N/A;  . COLONOSCOPY  11/2008   [facility]NL records in chart [facility])  . NASAL SINUS SURGERY  1978   sinus surgery done at [facility]  . NEPHRECTOMY Right 12/2011   [facility], R for RCC  . rectal fistula repair  1980s  . TEE WITHOUT CARDIOVERSION N/A 07/05/2015   Procedure: TRANSESOPHAGEAL ECHOCARDIOGRAM (TEE);  Surgeon: [name], MD;  Location: [place];  Service: Open Heart Surgery;  Laterality: N/A;    Prior to Admission medications   Medication Sig Start Date End Date Taking? Authorizing Provider  amLODipine (NORVASC) 2.5 MG tablet Take 1 tablet (2.5 mg total) by mouth daily. 10/22/18   [name], MD  aspirin 81 MG tablet Take 81 mg by mouth daily.    [provider]  fluticasone [name]) 50 MCG/ACT nasal spray INHALE 2 SPRAYS INTO EACH NOSTRIL EVERY DAY. 06/27/16   [name], MD  Multiple Vitamins-Minerals (  MULTIVITAMIN ADULT PO) Take 1 tablet by mouth daily.     [provider]  Omega-3 Fatty Acids (FISH OIL) 1000 MG CAPS Take 1 capsule (1,000 mg total) by mouth daily. 10/25/15   [name], MD  sildenafil (VIAGRA) 100 MG tablet Take 0.5-1 tablets (50-100 mg total) by mouth daily as needed for erectile dysfunction. 12/31/17   [name], NP  telmisartan (MICARDIS) 80 MG tablet TAKE ONE-HALF (1/2) OF A TABLET BY MOUTH TWICE DAILY. 02/11/19   [name], NP    Family History  Problem Relation Age of Onset  . Cancer Paternal Grandmother        throat  . Heart disease Mother   . Hypertension Mother   . Heart disease Father   . Diabetes Father    . Colon polyps Father   . Crohn's disease Father   . Coronary artery disease Maternal Uncle        MI  . Cancer Paternal Aunt        breast   . Cancer Paternal Aunt        brain  . Colon polyps Brother   . CAD Maternal Grandfather   . Stroke Neg Hx      Social History   Tobacco Use  . Smoking status: Never Smoker  . Smokeless tobacco: Never Used  Substance Use Topics  . Alcohol use: Yes    Comment: Socially  . Drug use: No    Allergies as of 02/16/2019 - Review Complete 01/08/2019  Allergen Reaction Noted  . Carvedilol Other (See Comments) 12/19/2016    Review of Systems:    All systems reviewed and negative except where noted in HPI.   Physical Exam:  There were no vitals taken for this visit. No LMP for male patient. Psych:  Alert and cooperative. Normal mood and affect. General:   Alert,  Well-developed, well-nourished, pleasant and cooperative in NAD Head:  Normocephalic and atraumatic. Eyes:  Sclera clear, no icterus.   Conjunctiva pink. Ears:  Normal auditory acuity. Nose:  No deformity, discharge, or lesions. Mouth:  No deformity or lesions,oropharynx pink & moist. Neck:  Supple; no masses or thyromegaly. Lungs:  Respirations even and unlabored.  Clear throughout to auscultation.   No wheezes, crackles, or rhonchi. No acute distress. Heart:  Regular rate and rhythm; no murmurs, clicks, rubs, or gallops. Abdomen:  Normal bowel sounds.  No bruits.  Soft, non-tender and non-distended without masses, hepatosplenomegaly or hernias noted.  No guarding or rebound tenderness.    Msk:  Symmetrical without gross deformities. Good, equal movement & strength bilaterally. Pulses:  Normal pulses noted. Extremities:  No clubbing or edema.  No cyanosis. Neurologic:  Alert and oriented x3;  grossly normal neurologically. Skin:  Intact without significant lesions or rashes. No jaundice. Lymph Nodes:  No significant cervical adenopathy. Psych:  Alert and cooperative. Normal  mood and affect.  Imaging Studies: US Abdomen Complete  Result Date: 01/19/2019 CLINICAL DATA:  Transaminitis. Previous right nephrectomy for renal cell carcinoma. EXAM: ABDOMEN ULTRASOUND COMPLETE COMPARISON:  CT on 12/06/2011 FINDINGS: Gallbladder: A few tiny gallstones measuring up to 5 mm are noted. Small amount of gallbladder sludge also seen. No evidence of gallbladder dilatation or wall thickening seen. No sonographic Murphy sign noted by sonographer. Common bile duct: Diameter: 4 mm, within normal limits. Liver: No focal lesion identified. Within normal limits in parenchymal echogenicity. Portal vein is patent on color Doppler imaging with normal direction of blood flow towards the  liver. IVC: No abnormality visualized. Pancreas: Visualized portion unremarkable. Spleen: Size and appearance within normal limits. Right Kidney: Surgically absent. No mass identified within the area of the nephrectomy bed. Left Kidney: Length: 12.5 cm. Echogenicity within normal limits. No mass or hydronephrosis visualized. Abdominal aorta: No aneurysm visualized. Atherosclerotic plaque noted. Other findings: None. IMPRESSION: Cholelithiasis. No sonographic signs of cholecystitis or biliary ductal dilatation. Previous right nephrectomy. No left renal or other abdominal masses identified. Electronically Signed   By: Marlaine Hind M.D.   On: 01/19/2019 08:43    Assessment and Plan:   Aaron Cole is a 68 y.o. y/o male has been referred for positive hepatitis C viral load and antibody.  This was recently checked due to raised transaminases on routine labs.  I will evaluate further by ruling out autoimmune liver disease, genetic disorders of the liver, hepatitis B coinfection.  Will determine his immunization status for hepatitis a and B and recommend appropriately.  We will rule out cirrhosis of the liver with a liver elastography and based on the above results can determine treatment for hepatitis C. I did discuss that  hepatitis C is transmitted with exchange of blood and sex drive could transmit the virus if there is breakage of the mucous membrane and exchange of blood.  Advised not to exchange these items keeping them exclusive to the individual and sanitizing them if used.  Follow up in 6 weeks.  Dr [name] MD,MRCP(U.K)

## 2019-02-16 NOTE — Patient Instructions (Signed)
Hepatitis C Hepatitis C is a liver infection that is caused by a virus. Many people have no symptoms or only mild symptoms. Hepatitis C is contagious. This means that it can spread from person to person. You can get this disease through:  Blood.  Birth.  Body fluids, like breast milk, tears, semen, vaginal fluids, and spit (saliva).  Blood or organ donations done in the [place] before 1992. Follow these instructions at home: Medicines  Take over-the-counter and prescription medicines only as told by your doctor.  Take your antiviral medicine as told by your doctor. Do not stop taking the antiviral medicine even if you start to feel better.  Do not take any new medicines unless your doctor says that this is okay. This includes over-the-counter medicines and birth control pills. Activity  Rest when you feel tired.  Do not have sex until your doctor says this is okay.  Ask your doctor when you may go back to school or work. Eating and drinking   Eat a balanced diet. Eat plenty of: ? Fruits and vegetables. ? Whole grains. ? Low-fat (lean) meats or non-meat proteins, such as beans or tofu.  Drink enough fluids to keep your pee (urine) clear or pale yellow.  Do not drink alcohol. How is this prevented?  Wash your hands often with soap and water. If you do not have soap and water, use hand sanitizer.  Do not share needles or syringes.  Practice safe sex and use condoms.  Do not handle blood or body fluids without gloves or other protection.  Avoid getting tattoos or piercings in places that are not clean. General instructions  Do not share toothbrushes, nail clippers, or razors.  Wash your hands often with soap and water. If you do not have soap and water, use hand sanitizer.  Cover any cuts or open sores on your skin.  Keep all follow-up visits as told by your doctor. This is important. You may need follow-up visits every 6-12 months. Contact a doctor if:   You have a fever.  You have belly (abdominal) pain.  Your pee (urine) is dark.  Your poop (bowel movement) is the color of clay.  You have joint pain. Get help right away if:  You feel more and more tired (fatigued).  You feel more and more weak.  You do not feel like eating.  You cannot eat or drink without throwing up (vomiting).  Your skin or the whites of your eyes turn yellow (jaundice) or turn more yellow than they were before.  You bruise or bleed easily. Summary  Hepatitis C is a liver infection that is caused by a virus.  The virus can be spread through blood and body fluids.  Do not take any new medicines unless your doctor says that this is okay.  Wash your hands often with soap and water. This helps prevent infection. This information is not intended to replace advice given to you by your health care provider. Make sure you discuss any questions you have with your health care provider. Document Released: 05/31/2008 Document Revised: 05/31/2017 Document Reviewed: 07/24/2016 Elsevier Patient Education  2020 [employer].

## 2019-02-20 ENCOUNTER — Ambulatory Visit
Admission: RE | Admit: 2019-02-20 | Discharge: 2019-02-20 | Disposition: A | Discharge status: Home / Self Care | Payer: Medicare Other | Source: Ambulatory Visit | Attending: Gastroenterology | Admitting: Gastroenterology

## 2019-02-20 ENCOUNTER — Other Ambulatory Visit: Payer: Self-pay

## 2019-02-20 ENCOUNTER — Other Ambulatory Visit
Admission: RE | Admit: 2019-02-20 | Discharge: 2019-02-20 | Disposition: A | Discharge status: Home / Self Care | Payer: Medicare Other | Source: Ambulatory Visit | Attending: Gastroenterology | Admitting: Gastroenterology

## 2019-02-20 DIAGNOSIS — Z20828 Contact with and (suspected) exposure to other viral communicable diseases: Secondary | ICD-10-CM | POA: Insufficient documentation

## 2019-02-20 DIAGNOSIS — Z01812 Encounter for preprocedural laboratory examination: Secondary | ICD-10-CM | POA: Diagnosis not present

## 2019-02-20 DIAGNOSIS — R945 Abnormal results of liver function studies: Secondary | ICD-10-CM | POA: Diagnosis not present

## 2019-02-20 DIAGNOSIS — B1920 Unspecified viral hepatitis C without hepatic coma: Secondary | ICD-10-CM | POA: Diagnosis not present

## 2019-02-20 DIAGNOSIS — R7989 Other specified abnormal findings of blood chemistry: Secondary | ICD-10-CM

## 2019-02-20 LAB — SARS CORONAVIRUS 2 (TAT 6-24 HRS): SARS Coronavirus 2: NEGATIVE

## 2019-02-21 LAB — ALPHA-1-ANTITRYPSIN: A-1 Antitrypsin: 146 mg/dL (ref 101–187)

## 2019-02-21 LAB — IMMUNOGLOBULINS A/E/G/M, SERUM
IgA/Immunoglobulin A, Serum: 401 mg/dL (ref 61–437)
IgE (Immunoglobulin E), Serum: 20 IU/mL (ref 6–495)
IgG (Immunoglobin G), Serum: 2589 mg/dL — ABNORMAL HIGH (ref 603–1613)
IgM (Immunoglobulin M), Srm: 171 mg/dL (ref 20–172)

## 2019-02-21 LAB — MITOCHONDRIAL/SMOOTH MUSCLE AB PNL
Mitochondrial Ab: <20 Units (ref 0.0–20.0)
Smooth Muscle Ab: 16 Units (ref 0–19)

## 2019-02-21 LAB — IRON,TIBC AND FERRITIN PANEL
Ferritin: 221 ng/mL (ref 30–400)
Iron Saturation: 26 % (ref 15–55)
Iron: 83 ug/dL (ref 38–169)
Total Iron Binding Capacity: 321 ug/dL (ref 250–450)
UIBC: 238 ug/dL (ref 111–343)

## 2019-02-21 LAB — HEPATITIS A ANTIBODY, TOTAL: hep A Total Ab: POSITIVE — AB

## 2019-02-21 LAB — CELIAC DISEASE AB SCREEN W/RFX
Antigliadin Abs, IgA: 6 units (ref 0–19)
Transglutaminase IgA: <2 U/mL (ref 0–3)

## 2019-02-21 LAB — HEPATITIS B CORE ANTIBODY, TOTAL: Hep B Core Total Ab: POSITIVE — AB

## 2019-02-21 LAB — HEPATITIS B SURFACE ANTIBODY,QUALITATIVE: Hep B Surface Ab, Qual: NONREACTIVE

## 2019-02-21 LAB — HEPATITIS C GENOTYPE

## 2019-02-21 LAB — CK: Total CK: 71 U/L (ref 41–331)

## 2019-02-21 LAB — CERULOPLASMIN: Ceruloplasmin: 26.1 mg/dL (ref 16.0–31.0)

## 2019-02-21 LAB — ANA: Anti Nuclear Antibody (ANA): NEGATIVE

## 2019-02-21 LAB — ANTI-MICROSOMAL ANTIBODY LIVER / KIDNEY: LKM1 Ab: 2.7 Units (ref 0.0–20.0)

## 2019-02-21 LAB — HEPATITIS B E ANTIGEN: Hep B E Ag: NEGATIVE

## 2019-02-21 LAB — HEPATITIS B E ANTIBODY: Hep B E Ab: NEGATIVE

## 2019-02-22 ENCOUNTER — Encounter: Payer: Self-pay | Admitting: Gastroenterology

## 2019-02-23 ENCOUNTER — Telehealth: Payer: Self-pay | Admitting: Gastroenterology

## 2019-02-23 ENCOUNTER — Encounter: Payer: Self-pay | Admitting: *Deleted

## 2019-02-23 NOTE — Telephone Encounter (Signed)
Pt left vm to speak to a nurse

## 2019-02-24 ENCOUNTER — Ambulatory Visit
Admission: RE | Admit: 2019-02-24 | Discharge: 2019-02-24 | Disposition: A | Discharge status: Home / Self Care | Payer: Medicare Other | Attending: Gastroenterology | Admitting: Gastroenterology

## 2019-02-24 ENCOUNTER — Ambulatory Visit: Payer: Medicare Other | Admitting: Registered Nurse

## 2019-02-24 ENCOUNTER — Other Ambulatory Visit: Payer: Self-pay

## 2019-02-24 ENCOUNTER — Encounter: Payer: Self-pay | Admitting: Anesthesiology

## 2019-02-24 ENCOUNTER — Encounter
Admission: RE | Disposition: A | Discharge status: Home / Self Care | Payer: Self-pay | Source: Home / Self Care | Attending: Gastroenterology

## 2019-02-24 DIAGNOSIS — I129 Hypertensive chronic kidney disease with stage 1 through stage 4 chronic kidney disease, or unspecified chronic kidney disease: Secondary | ICD-10-CM | POA: Insufficient documentation

## 2019-02-24 DIAGNOSIS — I2510 Atherosclerotic heart disease of native coronary artery without angina pectoris: Secondary | ICD-10-CM | POA: Insufficient documentation

## 2019-02-24 DIAGNOSIS — D122 Benign neoplasm of ascending colon: Secondary | ICD-10-CM | POA: Insufficient documentation

## 2019-02-24 DIAGNOSIS — Z83719 Family history of colon polyps, unspecified: Secondary | ICD-10-CM

## 2019-02-24 DIAGNOSIS — Z905 Acquired absence of kidney: Secondary | ICD-10-CM | POA: Insufficient documentation

## 2019-02-24 DIAGNOSIS — Z79899 Other long term (current) drug therapy: Secondary | ICD-10-CM | POA: Diagnosis not present

## 2019-02-24 DIAGNOSIS — N183 Chronic kidney disease, stage 3 (moderate): Secondary | ICD-10-CM | POA: Diagnosis not present

## 2019-02-24 DIAGNOSIS — Z7982 Long term (current) use of aspirin: Secondary | ICD-10-CM | POA: Insufficient documentation

## 2019-02-24 DIAGNOSIS — K635 Polyp of colon: Secondary | ICD-10-CM | POA: Diagnosis not present

## 2019-02-24 DIAGNOSIS — Z1211 Encounter for screening for malignant neoplasm of colon: Secondary | ICD-10-CM

## 2019-02-24 DIAGNOSIS — Z8371 Family history of colonic polyps: Secondary | ICD-10-CM

## 2019-02-24 DIAGNOSIS — K5730 Diverticulosis of large intestine without perforation or abscess without bleeding: Secondary | ICD-10-CM | POA: Diagnosis not present

## 2019-02-24 DIAGNOSIS — K640 First degree hemorrhoids: Secondary | ICD-10-CM | POA: Diagnosis not present

## 2019-02-24 DIAGNOSIS — Z85528 Personal history of other malignant neoplasm of kidney: Secondary | ICD-10-CM | POA: Insufficient documentation

## 2019-02-24 DIAGNOSIS — D126 Benign neoplasm of colon, unspecified: Secondary | ICD-10-CM | POA: Diagnosis not present

## 2019-02-24 DIAGNOSIS — D125 Benign neoplasm of sigmoid colon: Secondary | ICD-10-CM

## 2019-02-24 HISTORY — PX: COLONOSCOPY WITH PROPOFOL: SHX5780

## 2019-02-24 SURGERY — COLONOSCOPY WITH PROPOFOL
Anesthesia: General

## 2019-02-24 MED ORDER — PROPOFOL 500 MG/50ML IV EMUL
INTRAVENOUS | Status: AC
  Filled 2019-02-24: qty 50

## 2019-02-24 MED ORDER — PROPOFOL 500 MG/50ML IV EMUL
INTRAVENOUS | Status: DC | PRN
  Administered 2019-02-24: 150 ug/kg/min via INTRAVENOUS

## 2019-02-24 MED ORDER — LIDOCAINE HCL (CARDIAC) PF 100 MG/5ML IV SOSY
PREFILLED_SYRINGE | INTRAVENOUS | Status: DC | PRN
  Administered 2019-02-24: 40 mg via INTRAVENOUS

## 2019-02-24 MED ORDER — PROPOFOL 10 MG/ML IV BOLUS
INTRAVENOUS | Status: DC | PRN
  Administered 2019-02-24: 110 mg via INTRAVENOUS

## 2019-02-24 MED ORDER — SODIUM CHLORIDE 0.9 % IV SOLN
INTRAVENOUS | Status: DC
  Administered 2019-02-24: 1000 mL via INTRAVENOUS

## 2019-02-24 MED ORDER — EPHEDRINE SULFATE 50 MG/ML IJ SOLN
INTRAMUSCULAR | Status: AC
  Filled 2019-02-24: qty 1

## 2019-02-24 NOTE — Anesthesia Procedure Notes (Signed)
Date/Time: 02/24/2019 9:39 AM Performed by: Doreen Salvage, CRNA Pre-anesthesia Checklist: Patient identified, Emergency Drugs available, Suction available and Patient being monitored Patient Re-evaluated:Patient Re-evaluated prior to induction Oxygen Delivery Method: Nasal cannula Induction Type: IV induction Dental Injury: Teeth and Oropharynx as per pre-operative assessment  Comments: Nasal cannula with etCO2 monitoring

## 2019-02-24 NOTE — Op Note (Signed)
[facility] Patient Name: Aaron Cole Procedure Date: 02/24/2019 9:38 AM MRN: [id number] Account #: [id number] Date of Birth: 1950/07/24 Admit Type: Outpatient Age: 68 Room: [facility] ENDO ROOM 4 Gender: Male Note Status: Finalized Procedure:            Colonoscopy Indications:          Screening for colorectal malignant neoplasm Providers:            Lucilla Lame MD, MD Referring MD:         [name] (Referring MD) Medicines:            Propofol per Anesthesia Complications:        No immediate complications. Procedure:            Pre-Anesthesia Assessment:                       - Prior to the procedure, a History and Physical was                        performed, and patient medications and allergies were                        reviewed. The patient's tolerance of previous                        anesthesia was also reviewed. The risks and benefits of                        the procedure and the sedation options and risks were                        discussed with the patient. All questions were                        answered, and informed consent was obtained. Prior                        Anticoagulants: The patient has taken no previous                        anticoagulant or antiplatelet agents. ASA Grade                        Assessment: II - A patient with mild systemic disease.                        After reviewing the risks and benefits, the patient was                        deemed in satisfactory condition to undergo the                        procedure.                       After obtaining informed consent, the colonoscope was                        passed under direct vision. Throughout the procedure,  the patient's blood pressure, pulse, and oxygen                        saturations were monitored continuously. The                        Colonoscope was introduced through the anus and      advanced to the the cecum, identified by appendiceal                        orifice and ileocecal valve. The colonoscopy was                        performed without difficulty. The patient tolerated the                        procedure well. The quality of the bowel preparation                        was excellent. Findings:      The perianal and digital rectal examinations were normal.      A 3 mm polyp was found in the ascending colon. The polyp was sessile.       The polyp was removed with a cold biopsy forceps. Resection and       retrieval were complete.      A 5 mm polyp was found in the sigmoid colon. The polyp was sessile. The       polyp was removed with a cold biopsy forceps. Resection and retrieval       were complete.      Multiple medium-mouthed diverticula were found in the sigmoid colon.      Non-bleeding internal hemorrhoids were found during retroflexion. The       hemorrhoids were Grade I (internal hemorrhoids that do not prolapse). Impression:           - One 3 mm polyp in the ascending colon, removed with a                        cold biopsy forceps. Resected and retrieved.                       - One 5 mm polyp in the sigmoid colon, removed with a                        cold biopsy forceps. Resected and retrieved.                       - Diverticulosis in the sigmoid colon.                       - Non-bleeding internal hemorrhoids. Recommendation:       - Discharge patient to home.                       - Resume previous diet.                       - Continue present medications.                       - Await pathology results.                       -  Repeat colonoscopy in 5 years if polyp adenoma and 10                        years if hyperplastic Procedure Code(s):    --- Professional ---                       [id number], Colonoscopy, flexible; with biopsy, single or                        multiple Diagnosis Code(s):    --- Professional ---                        Z12.11, Encounter for screening for malignant neoplasm                        of colon                       K63.5, Polyp of colon CPT copyright 2019 American Medical Association. All rights reserved. The codes documented in this report are preliminary and upon coder review may  be revised to meet current compliance requirements. Lucilla Lame MD, MD 02/24/2019 9:54:01 AM This report has been signed electronically. Number of Addenda: 0 Note Initiated On: 02/24/2019 9:38 AM Scope Withdrawal Time: 0 hours 6 minutes 0 seconds  Total Procedure Duration: 0 hours 10 minutes 15 seconds  Estimated Blood Loss: Estimated blood loss: none.      [facility]

## 2019-02-24 NOTE — Anesthesia Postprocedure Evaluation (Signed)
Anesthesia Post Note  Patient: Aaron Cole  Procedure(s) Performed: COLONOSCOPY WITH PROPOFOL (N/A )  Patient location during evaluation: Endoscopy Anesthesia Type: General Level of consciousness: awake and alert Pain management: pain level controlled Vital Signs Assessment: post-procedure vital signs reviewed and stable Respiratory status: spontaneous breathing, nonlabored ventilation, respiratory function stable and patient connected to nasal cannula oxygen Cardiovascular status: blood pressure returned to baseline and stable Postop Assessment: no apparent nausea or vomiting Anesthetic complications: no     Last Vitals:  Vitals:   02/24/19 1010 02/24/19 1020  BP: 132/82 131/77  Pulse: 65 (!) 58  Resp: 14 16  Temp:    SpO2: 96% 99%    Last Pain:  Vitals:   02/24/19 0950  TempSrc: Tympanic                 Martha Clan

## 2019-02-24 NOTE — Transfer of Care (Signed)
Immediate Anesthesia Transfer of Care Note  Patient: Aaron Cole  Procedure(s) Performed: Procedure(s): COLONOSCOPY WITH PROPOFOL (N/A)  Patient Location: PACU and Endoscopy Unit  Anesthesia Type:General  Level of Consciousness: sedated  Airway & Oxygen Therapy: Patient Spontanous Breathing and Patient connected to nasal cannula oxygen  Post-op Assessment: Report given to RN and Post -op Vital signs reviewed and stable  Post vital signs: Reviewed and stable  Last Vitals:  Vitals:   02/24/19 0950 02/24/19 0957  BP: (!) 92/49 (!) 92/49  Pulse: 61 66  Resp: (!) 31 (!) 28  Temp: (!) 36.1 C   SpO2: 0000000 0000000    Complications: No apparent anesthesia complications

## 2019-02-24 NOTE — Anesthesia Preprocedure Evaluation (Signed)
Anesthesia Evaluation  Patient identified by MRN, date of birth, ID band Patient awake    Reviewed: Allergy & Precautions, H&P , NPO status , Patient's Chart, lab work & pertinent test results, reviewed documented beta blocker date and time   History of Anesthesia Complications Negative for: history of anesthetic complications  Airway Mallampati: III  TM Distance: >3 FB Neck ROM: full    Dental  (+) Dental Advidsory Given, Teeth Intact Permanent bridge x2:   Pulmonary neg pulmonary ROS,    Pulmonary exam normal        Cardiovascular Exercise Tolerance: Good hypertension, (-) angina+ CAD  (-) Past MI, (-) Cardiac Stents and (-) CABG Normal cardiovascular exam(-) dysrhythmias + Valvular Problems/Murmurs (s/p AVR and beginning portion of aorta)      Neuro/Psych negative neurological ROS  negative psych ROS   GI/Hepatic negative GI ROS, Neg liver ROS,   Endo/Other  negative endocrine ROS  Renal/GU CRFRenal disease  negative genitourinary   Musculoskeletal   Abdominal   Peds  Hematology negative hematology ROS (+)   Anesthesia Other Findings Past Medical History: 2013: CAD (coronary artery disease)     Comment:  per CT scan  - brings CD of CT, no report - will need               [facility] records of [facility] scan report 2015: CKD (chronic kidney disease) stage 3, GFR 30-59 ml/min (HCC)     Comment:  iatrogenic (decreased nephron mass) established with Dr.              Alfonse [facility]) No date: Diverticulosis     Comment:  by CT scan No date: HTN (hypertension) 2013: Renal cell carcinoma ([place])     Comment:  clear cell s/p R nephrectomy 2013: S/p nephrectomy     Comment:  [facility] No date: Sinus headache     Comment:  recurrent   Reproductive/Obstetrics negative OB ROS                             Anesthesia Physical Anesthesia Plan  ASA: II  Anesthesia Plan: General   Post-op Pain Management:     Induction: Intravenous  PONV Risk Score and Plan: 2 and Propofol infusion and TIVA  Airway Management Planned: Natural Airway and Nasal Cannula  Additional Equipment:   Intra-op Plan:   Post-operative Plan:   Informed Consent: I have reviewed the patients History and Physical, chart, labs and discussed the procedure including the risks, benefits and alternatives for the proposed anesthesia with the patient or authorized representative who has indicated his/her understanding and acceptance.     Dental Advisory Given  Plan Discussed with: Anesthesiologist, CRNA and Surgeon  Anesthesia Plan Comments:         Anesthesia Quick Evaluation

## 2019-02-24 NOTE — H&P (Signed)
Lucilla Lame, MD [facility] [address] [place], [place] [address] Phone: [phone] Fax : [phone]  Primary Care Physician:  [name], MD Primary Gastroenterologist:  Dr. Allen Norris  Pre-Procedure History & Physical: HPI:  Aaron Cole is a 68 y.o. male is here for a screening colonoscopy.   Past Medical History:  Diagnosis Date  . CAD (coronary artery disease) 2013   per CT scan  - brings CD of CT, no report - will need [facility] records of CT scan report  . CKD (chronic kidney disease) stage 3, GFR 30-59 ml/min ([place]) 2015   iatrogenic (decreased nephron mass) established with Dr. Alfonse [facility])  . Diverticulosis    by CT scan  . HTN (hypertension)   . Renal cell carcinoma ([place]) 2013   clear cell s/p R nephrectomy  . S/p nephrectomy 2013   [facility]  . Sinus headache    recurrent    Past Surgical History:  Procedure Laterality Date  . BENTALL PROCEDURE N/A 07/05/2015   Procedure: BENTALL PROCEDURE UTILIZING A 28 X 10 MM WOVEN DOUBLE VELOUR VASCULAR GRAFT AND A 28MM GELWEAVE WOVEN VASCULAR GRAFT. 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 COLONOSCOPY  11/2008   [facility] records in chart [facility])  . CORONARY ANGIOPLASTY    . NASAL SINUS SURGERY  1978   sinus surgery done at [facility]  . NEPHRECTOMY Right 12/2011   [facility], R for RCC  . rectal fistula repair  1980s  . TEE WITHOUT CARDIOVERSION N/A 07/05/2015   Procedure: TRANSESOPHAGEAL ECHOCARDIOGRAM (TEE);  Surgeon: [name], MD;  Location: [facility];  Service: Open Heart Surgery;  Laterality: N/A;    Prior to Admission medications   Medication Sig Start Date End Date Taking? Authorizing Provider  amLODipine (NORVASC) 2.5 MG tablet Take 1 tablet (2.5 mg total) by mouth daily. 10/22/18  Yes [name], MD  aspirin 81 MG tablet Take 81 mg by mouth daily.   Yes [provider]   fluticasone (FLONASE) 50 MCG/ACT nasal spray INHALE 2 SPRAYS INTO EACH NOSTRIL EVERY DAY. 06/27/16  Yes [name], MD  Multiple Vitamins-Minerals (MULTIVITAMIN ADULT PO) Take 1 tablet by mouth daily.    Yes [provider]  Omega-3 Fatty Acids (FISH OIL) 1000 MG CAPS Take 1 capsule (1,000 mg total) by mouth daily. 10/25/15  Yes [name], MD  telmisartan (MICARDIS) 80 MG tablet TAKE ONE-HALF (1/2) OF A TABLET BY MOUTH TWICE DAILY. 02/11/19  Yes [name], NP  sildenafil (VIAGRA) 100 MG tablet Take 0.5-1 tablets (50-100 mg total) by mouth daily as needed for erectile dysfunction. 12/31/17   [name], NP    Allergies as of 02/10/2019 - Review Complete 01/08/2019  Allergen Reaction Noted  . Carvedilol Other (See Comments) 12/19/2016    Family History  Problem Relation Age of Onset  . Cancer Paternal Grandmother        throat  . Heart disease Mother   . Hypertension Mother   . Heart disease Father   . Diabetes Father   . Colon polyps Father   . Crohn's disease Father   . Coronary artery disease Maternal Uncle        MI  . Cancer Paternal Aunt        breast   . Cancer Paternal Aunt  brain  . Colon polyps Brother   . CAD Maternal Grandfather   . Stroke Neg Hx     Social History   Socioeconomic History  . Marital status: Single    Spouse name: Not on file  . Number of children: Not on file  . Years of education: Not on file  . Highest education level: Not on file  Occupational History  . Not on file  Social Needs  . Financial resource strain: Not on file  . Food insecurity    Worry: Not on file    Inability: Not on file  . Transportation needs    Medical: Not on file    Non-medical: Not on file  Tobacco Use  . Smoking status: Never Smoker  . Smokeless tobacco: Never Used  Substance and Sexual Activity  . Alcohol use: Yes    Comment: Socially  . Drug use: Never  . Sexual activity: Yes  Lifestyle  . Physical activity    Days  per week: Not on file    Minutes per session: Not on file  . Stress: Not on file  Relationships  . Social Herbalist on phone: Not on file    Gets together: Not on file    Attends religious service: Not on file    Active member of club or organization: Not on file    Attends meetings of clubs or organizations: Not on file    Relationship status: Not on file  . Intimate partner violence    Fear of current or ex partner: Not on file    Emotionally abused: Not on file    Physically abused: Not on file    Forced sexual activity: Not on file  Other Topics Concern  . Not on file  Social History Narrative   Caffeine: occasional   Lives alone, 1 cat   Occupation: farming business   Edu: BS   Activity: bikes 3-4x/wk about 1 hour   Diet: good water, fruits/vegetables daily, steak 1x/wk, fish 2x/wk    Review of Systems: See HPI, otherwise negative ROS  Physical Exam: BP (!) 154/104   Pulse 73   Temp (!) 96.9 F (36.1 C) (Tympanic)   Resp 18   Ht 5' 11.25" (1.81 m)   Wt 93.4 kg   SpO2 97%   BMI 28.53 kg/m  General:   Alert,  pleasant and cooperative in NAD Head:  Normocephalic and atraumatic. Neck:  Supple; no masses or thyromegaly. Lungs:  Clear throughout to auscultation.    Heart:  Regular rate and rhythm. Abdomen:  Soft, nontender and nondistended. Normal bowel sounds, without guarding, and without rebound.   Neurologic:  Alert and  oriented x4;  grossly normal neurologically.  Impression/Plan: Aaron Cole is now here to undergo a screening colonoscopy.  Risks, benefits, and alternatives regarding colonoscopy have been reviewed with the patient.  Questions have been answered.  All parties agreeable.

## 2019-02-24 NOTE — Anesthesia Post-op Follow-up Note (Signed)
Anesthesia QCDR form completed.        

## 2019-02-25 ENCOUNTER — Encounter: Payer: Self-pay | Admitting: Family Medicine

## 2019-02-25 LAB — SURGICAL PATHOLOGY

## 2019-02-26 ENCOUNTER — Encounter: Payer: Self-pay | Admitting: Gastroenterology

## 2019-03-02 ENCOUNTER — Encounter: Payer: Self-pay | Admitting: Family Medicine

## 2019-03-02 DIAGNOSIS — B1920 Unspecified viral hepatitis C without hepatic coma: Secondary | ICD-10-CM | POA: Insufficient documentation

## 2019-03-02 DIAGNOSIS — Z8619 Personal history of other infectious and parasitic diseases: Secondary | ICD-10-CM | POA: Insufficient documentation

## 2019-03-02 HISTORY — DX: Unspecified viral hepatitis C without hepatic coma: B19.20

## 2019-03-04 ENCOUNTER — Ambulatory Visit (INDEPENDENT_AMBULATORY_CARE_PROVIDER_SITE_OTHER): Payer: Medicare Other | Admitting: Gastroenterology

## 2019-03-04 ENCOUNTER — Other Ambulatory Visit: Payer: Self-pay

## 2019-03-04 VITALS — BP 100/68 | HR 65 | Temp 98.5°F | Ht 70.0 in | Wt 204.4 lb

## 2019-03-04 DIAGNOSIS — B1920 Unspecified viral hepatitis C without hepatic coma: Secondary | ICD-10-CM | POA: Diagnosis not present

## 2019-03-04 NOTE — Progress Notes (Signed)
[name] MD, MRCP(U.K) [address]  [place]  [place], [place] [address]  Main: [phone]  Fax: [phone]   Primary Care Physician: [name], MD  Primary Gastroenterologist:  Dr. [name]  Hepatitis C follow-up  HPI: Aaron Cole is a 68 y.o. male   Summary of history :  Initially referred and seen on 02/16/2019.Recent CMP performed on 01/08/2019 demonstrated elevation of the AST and ALT at 210 and 307 respectively.  Albumin of 4.1.  And normal total bilirubin and creatinine.  Sh was normal.  Hemoglobin 13.7 g with a platelet count 254.  Hepatic function panel repeated 2 weeks back showed that the AST and ALT are further raised at 267 and 410 respectively.  Written normal at 172 acute hepatitis panel was negative for acute hepatitis a and B.  Hepatitis C viral antibody was positive.  Viral load of 2.75 million  01/19/2019 right upper quadrant ultrasound showed cholelithiasis.  Status post right nephrectomy.  Denies any prior blood transfusions, tattoos, [employer], illegal drug use, excess alcohol use.  He did admit that he uses certain sex toys and he is pretty sure there was some exchange of blood during the usage.  Never been treated for hepatitis C.  Only recently found that that he had hepatitis C.  He does recall that he was checked a year prior and was negative for hepatitis C.   Interval history   02/16/2019-03/04/2019  02/20/2019: Ultrasound right upper quadrant with elastography: F2 and F3 fibrosis.  02/16/2019: Hepatitis C genotype 1a.  Hepatitis B surface antibody not reactive.  ANA, AMA, smooth muscle antibody, ceruloplasmin, iron studies, celiac serology, alpha-1 antitrypsin levels, L KM antibody, total CK either normal or negative.  Hepatitis A total antibody positive.  Hepatitis B core total antibody positive.  Hepatitis B E antibody negative.  Hepatitis B E antigen negative.  Gust the results of his FibroScan.  I advised him to limit  alcohol consumption after treatment for hepatitis C and during hepatitis C treatment to completely avoid all alcohol consumption.  Current Outpatient Medications  Medication Sig Dispense Refill   amLODipine (NORVASC) 2.5 MG tablet Take 1 tablet (2.5 mg total) by mouth daily. 90 tablet 3   aspirin 81 MG tablet Take 81 mg by mouth daily.     fluticasone (FLONASE) 50 MCG/ACT nasal spray INHALE 2 SPRAYS INTO EACH NOSTRIL EVERY DAY. 16 g 3   Multiple Vitamins-Minerals (MULTIVITAMIN ADULT PO) Take 1 tablet by mouth daily.      Omega-3 Fatty Acids (FISH OIL) 1000 MG CAPS Take 1 capsule (1,000 mg total) by mouth daily.  0   sildenafil (VIAGRA) 100 MG tablet Take 0.5-1 tablets (50-100 mg total) by mouth daily as needed for erectile dysfunction. 10 tablet 6   telmisartan (MICARDIS) 80 MG tablet TAKE ONE-HALF (1/2) OF A TABLET BY MOUTH TWICE DAILY. 90 tablet 3   No current facility-administered medications for this visit.     Allergies as of 03/04/2019 - Review Complete 02/24/2019  Allergen Reaction Noted   Carvedilol Other (See Comments) 12/19/2016    ROS:  General: Negative for anorexia, weight loss, fever, chills, fatigue, weakness. ENT: Negative for hoarseness, difficulty swallowing , nasal congestion. CV: Negative for chest pain, angina, palpitations, dyspnea on exertion, peripheral edema.  Respiratory: Negative for dyspnea at rest, dyspnea on exertion, cough, sputum, wheezing.  GI: See history of present illness. GU:  Negative for dysuria, hematuria, urinary incontinence, urinary frequency, nocturnal urination.  Endo: Negative for unusual  weight change.    Physical Examination:   There were no vitals taken for this visit.  General: Well-nourished, well-developed in no acute distress.  Eyes: No icterus. Conjunctivae pink. Mouth: Oropharyngeal mucosa moist and pink , no lesions erythema or exudate. Lungs: Clear to auscultation bilaterally. Non-labored. Heart: Regular rate and  rhythm, no murmurs rubs or gallops.  Abdomen: Bowel sounds are normal, nontender, nondistended, no hepatosplenomegaly or masses, no abdominal bruits or hernia , no rebound or guarding.   Extremities: No lower extremity edema. No clubbing or deformities. Neuro: Alert and oriented x 3.  Grossly intact. Skin: Warm and dry, no jaundice.   Psych: Alert and cooperative, normal mood and affect.   Imaging Studies: US Abdomen Ruq W/elastography  Result Date: 02/20/2019 CLINICAL DATA:  Hepatitis-C.  RIGHT nephrectomy EXAM: US ABDOMEN LIMITED - RIGHT UPPER QUADRANT ULTRASOUND HEPATIC ELASTOGRAPHY TECHNIQUE: Limited right upper quadrant abdominal ultrasound was performed. In addition, ultrasound elastography evaluation of the liver was performed. A region of interest was placed in the right lobe of the liver. Following application of a compressive sonographic pulse, shear waves were detected in the adjacent hepatic tissue and the shear wave velocity was calculated. Multiple assessments were performed at the selected site. Median shear wave velocity is correlated to a Metavir fibrosis score. COMPARISON:  Ultrasound 01/19/2019 FINDINGS: ULTRASOUND ABDOMEN LIMITED RIGHT UPPER QUADRANT Gallbladder: No gallstones or wall thickening visualized. No sonographic Murphy sign noted. Common bile duct: Diameter: Normal 2.8 mm Liver: No focal lesion identified. Mild nodular contour of the liver. Within normal limits in parenchymal echogenicity. Portal vein is patent on color Doppler imaging with normal direction of blood flow towards the liver. ULTRASOUND HEPATIC ELASTOGRAPHY Device: Siemens Helix VTQ Patient position: Supine Transducer 4V1 Number of measurements: 10 Hepatic segment:  8 Median velocity:   1.78 m/sec IQR: 0.16 IQR/Median velocity ratio: 0.09 Corresponding Metavir fibrosis score:  F2 + some F3 Risk of fibrosis: Moderate Limitations of exam: None Please note that abnormal shear wave velocities may also be identified  in clinical settings other than with hepatic fibrosis, such as: acute hepatitis, elevated right heart and central venous pressures including use of beta blockers, veno-occlusive disease (Budd-Chiari), infiltrative processes such as mastocytosis/amyloidosis/infiltrative tumor, extrahepatic cholestasis, in the post-prandial state, and liver transplantation. Correlation with patient history, laboratory data, and clinical condition recommended. IMPRESSION: ULTRASOUND ABDOMEN: Potential mild nodular contour of the liver. ULTRASOUND HEPATIC ELASTOGRAHY: Median hepatic shear wave velocity is calculated at 1.78 m/sec. Corresponding Metavir fibrosis score is F2 + some F3. Risk of fibrosis is Moderate. Follow-up: Additional testing appropriate Electronically Signed   By: Suzy Bouchard M.D.   On: 02/20/2019 09:55    Assessment and Plan:   Aaron Cole is a 67 y.o. y/o male here to follow-up for hepatitis C genotype 1a, treatment nave, noncirrhotic.  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  Plan 1.  Obtain process of prior authorization for hepatitis C treatment. 2.  In view of prior hepatitis B that he spontaneously cleared we would need to keep a close watch on his liver function tests while treating his hepatitis C.  There is a small possibility of reactivation of hepatitis B during the treatment of hepatitis C.  Hence I would recommend checking liver function tests initially every week and every 2 weeks for a few weeks and then every 4 weeks until we complete treatment and subsequently repeat liver function tests a few weeks after treatment with rechecking for SVR.  If there is any rise in liver function test then we need to check for hepatitis B viral load and E antigen status.   Dr [name]  MD,MRCP [facility]) Follow up in 6 weeks after starting treatment

## 2019-03-09 ENCOUNTER — Encounter: Payer: Self-pay | Admitting: Family Medicine

## 2019-03-09 DIAGNOSIS — Z8619 Personal history of other infectious and parasitic diseases: Secondary | ICD-10-CM | POA: Insufficient documentation

## 2019-03-27 ENCOUNTER — Other Ambulatory Visit: Payer: Self-pay

## 2019-03-27 ENCOUNTER — Encounter: Payer: Medicare Other | Admitting: Family Medicine

## 2019-03-27 DIAGNOSIS — B1920 Unspecified viral hepatitis C without hepatic coma: Secondary | ICD-10-CM

## 2019-04-01 ENCOUNTER — Telehealth: Payer: Self-pay | Admitting: Gastroenterology

## 2019-04-01 NOTE — Telephone Encounter (Signed)
Pt left vm he is supposed to have Labs drawn and has a question he needs a call ASAP

## 2019-04-01 NOTE — Telephone Encounter (Signed)
Spoke with pt regarding his question about the labs. Pt wanted to know if he's okay to have labs collected at his primary care office I explained that it's okay if they use [facility] they'll be able to access the order. If a different lab is used we would have to fax the order. Pt plans to contact his pcp office to find out what lab they use.

## 2019-04-02 ENCOUNTER — Telehealth: Payer: Self-pay

## 2019-04-02 ENCOUNTER — Telehealth: Payer: Self-pay | Admitting: Gastroenterology

## 2019-04-02 ENCOUNTER — Other Ambulatory Visit: Payer: Self-pay

## 2019-04-02 NOTE — Telephone Encounter (Signed)
Pt.notified

## 2019-04-02 NOTE — Telephone Encounter (Signed)
Ginger just needs LFT's to ensure not rising from re activation of hep B, ok for flu shot

## 2019-04-02 NOTE — Telephone Encounter (Signed)
Pt called to see if could get a lab test done at [facility] that is ordered by a non [facility] provider. Advised we can do lab testing at our site for [facility] providers only. Pt said that was OK and will go to [facility] drawing station. Nothing further needed.

## 2019-04-02 NOTE — Telephone Encounter (Signed)
Pt is calling he wants to make sure the rx Dr. Vicente Males prescripted is ok to get a flu shot  rx is  Called [id number] . He also wants to make sure his orders for [facility] are in

## 2019-04-02 NOTE — Telephone Encounter (Signed)
Dr. Vicente Males, see message below. What lab are you requesting for this pt? Is this the pt that had a previous exposure to Hep B and you wanted to keep a check on his liver enzymes?

## 2019-04-03 DIAGNOSIS — B1920 Unspecified viral hepatitis C without hepatic coma: Secondary | ICD-10-CM | POA: Diagnosis not present

## 2019-04-04 LAB — HEPATIC FUNCTION PANEL
ALT: 15 IU/L (ref 0–44)
AST: 22 IU/L (ref 0–40)
Albumin: 4.4 g/dL (ref 3.8–4.8)
Alkaline Phosphatase: 69 IU/L (ref 39–117)
Bilirubin Total: 0.6 mg/dL (ref 0.0–1.2)
Bilirubin, Direct: 0.19 mg/dL (ref 0.00–0.40)
Total Protein: 7.9 g/dL (ref 6.0–8.5)

## 2019-04-05 ENCOUNTER — Encounter: Payer: Self-pay | Admitting: Gastroenterology

## 2019-04-06 ENCOUNTER — Telehealth: Payer: Self-pay

## 2019-04-06 DIAGNOSIS — R945 Abnormal results of liver function studies: Secondary | ICD-10-CM

## 2019-04-06 DIAGNOSIS — R7989 Other specified abnormal findings of blood chemistry: Secondary | ICD-10-CM

## 2019-04-06 NOTE — Telephone Encounter (Signed)
Patient verbalized understanding. Will repeat labs in 4 weeks. Order labs for 4 weeks

## 2019-04-06 NOTE — Telephone Encounter (Signed)
-----   Message from [name], MD sent at 04/05/2019  9:27 AM EDT ----- LFT's completely normal - recheck in 4 weeks

## 2019-04-23 ENCOUNTER — Ambulatory Visit (INDEPENDENT_AMBULATORY_CARE_PROVIDER_SITE_OTHER): Payer: Medicare Other | Admitting: Gastroenterology

## 2019-04-23 ENCOUNTER — Encounter: Payer: Self-pay | Admitting: Gastroenterology

## 2019-04-23 ENCOUNTER — Other Ambulatory Visit: Payer: Self-pay

## 2019-04-23 VITALS — BP 147/97 | HR 64 | Temp 98.3°F | Ht 70.0 in | Wt 211.4 lb

## 2019-04-23 DIAGNOSIS — R945 Abnormal results of liver function studies: Secondary | ICD-10-CM

## 2019-04-23 DIAGNOSIS — R7989 Other specified abnormal findings of blood chemistry: Secondary | ICD-10-CM

## 2019-04-23 DIAGNOSIS — B1920 Unspecified viral hepatitis C without hepatic coma: Secondary | ICD-10-CM | POA: Diagnosis not present

## 2019-04-23 NOTE — Progress Notes (Signed)
Aaron Bellows MD, MRCP(U.K) [address]  [place]  [place],  [place] [address]  Main: [phone]  Fax: [phone]   Primary Care Physician: Aaron Bush, MD  Primary Gastroenterologist:  Dr. Jonathon Cole   Hepatitis C follow-up  HPI: Aaron Cole is a 68 y.o. male    Summary of history :  Initially referred and seen on 02/16/2019.Recent CMP performed on 01/08/2019 demonstrated elevation of the AST and ALT at 210 and 307 respectively. Albumin of 4.1. And normal total bilirubin and creatinine. Hepatitis C viral antibody was positive. Viral load of 2.75 million.  Hepatitis B core total antibody positive.  Viral hepatitis and autoimmune hepatitis panel otherwise negative.  Hepatitis A total antibody positive.  01/19/2019 right upper quadrant ultrasound showed cholelithiasis. Status post right nephrectomy.  Denies any prior blood transfusions, tattoos, [employer], illegal drug use, excess alcohol use. He did admit that he uses certain sex toys and he is pretty sure there was some exchange of blood during the usage. Never been treated for hepatitis C. Only recently found that that he had hepatitis C. He does recall that he was checked a year prior and was negative for hepatitis C.  02/20/2019: Ultrasound right upper quadrant with elastography: F2 and F3 fibrosis.   Interval history  03/04/2019-04/23/2019  04/03/2019: LFTs normal previously elevated  Has 6 more weeks to complete his hepatitis C treatment doing well no complaints    Current Outpatient Medications  Medication Sig Dispense Refill  . amLODipine (NORVASC) 2.5 MG tablet Take 1 tablet (2.5 mg total) by mouth daily. 90 tablet 3  . aspirin 81 MG tablet Take 81 mg by mouth daily.    . fluticasone (FLONASE) 50 MCG/ACT nasal spray INHALE 2 SPRAYS INTO EACH NOSTRIL EVERY DAY. 16 g 3  . Multiple Vitamins-Minerals (MULTIVITAMIN ADULT PO) Take 1 tablet by mouth daily.     . Omega-3 Fatty Acids (FISH  OIL) 1000 MG CAPS Take 1 capsule (1,000 mg total) by mouth daily.  0  . sildenafil (VIAGRA) 100 MG tablet Take 0.5-1 tablets (50-100 mg total) by mouth daily as needed for erectile dysfunction. 10 tablet 6  . telmisartan (MICARDIS) 80 MG tablet TAKE ONE-HALF (1/2) OF A TABLET BY MOUTH TWICE DAILY. 90 tablet 3   No current facility-administered medications for this visit.     Allergies as of 04/23/2019 - Review Complete 03/04/2019  Allergen Reaction Noted  . Carvedilol Other (See Comments) 12/19/2016    ROS:  General: Negative for anorexia, weight loss, fever, chills, fatigue, weakness. ENT: Negative for hoarseness, difficulty swallowing , nasal congestion. CV: Negative for chest pain, angina, palpitations, dyspnea on exertion, peripheral edema.  Respiratory: Negative for dyspnea at rest, dyspnea on exertion, cough, sputum, wheezing.  GI: See history of present illness. GU:  Negative for dysuria, hematuria, urinary incontinence, urinary frequency, nocturnal urination.  Endo: Negative for unusual weight change.    Physical Examination:   There were no vitals taken for this visit.  General: Well-nourished, well-developed in no acute distress.  Eyes: No icterus. Conjunctivae pink. Mouth: Oropharyngeal mucosa moist and pink , no lesions erythema or exudate. Lungs: Clear to auscultation bilaterally. Non-labored. Heart: Regular rate and rhythm, no murmurs rubs or gallops.  Abdomen: Bowel sounds are normal, nontender, nondistended, no hepatosplenomegaly or masses, no abdominal bruits or hernia , no rebound or guarding.   Extremities: No lower extremity edema. No clubbing or deformities. Neuro: Alert and oriented x 3.  Grossly intact. Skin: Warm and dry, no  jaundice.   Psych: Alert and cooperative, normal mood and affect.   Imaging Studies: No results found.  Assessment and Plan:   Aaron Cole is a 68 y.o. y/o male  here to follow-up for hepatitis C genotype 1a, treatment nave,  noncirrhotic.  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  LFTs checked on 04/03/2019 showed normal transaminases.  No evidence of hepatitis B reactivation at this point of time.  Plan 1.  Complete hepatitis C treatment 2.  Check hepatic function panel today 3.  Check for SVR in 3 months after completion of treatment   Dr Aaron Bellows  MD,MRCP [facility]) Follow up in 3-4 months

## 2019-04-24 ENCOUNTER — Encounter: Payer: Self-pay | Admitting: Gastroenterology

## 2019-04-24 LAB — HEPATIC FUNCTION PANEL
ALT: 14 IU/L (ref 0–44)
AST: 21 IU/L (ref 0–40)
Albumin: 4.4 g/dL (ref 3.8–4.8)
Alkaline Phosphatase: 68 IU/L (ref 39–117)
Bilirubin Total: 0.6 mg/dL (ref 0.0–1.2)
Bilirubin, Direct: 0.13 mg/dL (ref 0.00–0.40)
Total Protein: 7.6 g/dL (ref 6.0–8.5)

## 2019-04-30 ENCOUNTER — Telehealth: Payer: Self-pay

## 2019-04-30 ENCOUNTER — Other Ambulatory Visit (HOSPITAL_COMMUNITY): Payer: Medicare Other

## 2019-04-30 NOTE — Telephone Encounter (Signed)
Spoke with pt and informed him of lab results. 

## 2019-04-30 NOTE — Telephone Encounter (Signed)
Pt called and left a vm requesting clarification of his recent lab results. Returned pt call but was unable to contact, LVM to return call

## 2019-05-25 ENCOUNTER — Other Ambulatory Visit (HOSPITAL_COMMUNITY): Payer: Medicare Other

## 2019-06-04 DIAGNOSIS — Z23 Encounter for immunization: Secondary | ICD-10-CM | POA: Diagnosis not present

## 2019-06-09 DIAGNOSIS — N183 Chronic kidney disease, stage 3 unspecified: Secondary | ICD-10-CM | POA: Diagnosis not present

## 2019-06-17 ENCOUNTER — Ambulatory Visit (HOSPITAL_COMMUNITY)
Admission: RE | Admit: 2019-06-17 | Discharge: 2019-06-17 | Disposition: A | Discharge status: Home / Self Care | Payer: Medicare Other | Source: Ambulatory Visit | Attending: Interventional Cardiology | Admitting: Interventional Cardiology

## 2019-06-17 ENCOUNTER — Other Ambulatory Visit: Payer: Self-pay

## 2019-06-17 DIAGNOSIS — E785 Hyperlipidemia, unspecified: Secondary | ICD-10-CM | POA: Insufficient documentation

## 2019-06-17 DIAGNOSIS — Z952 Presence of prosthetic heart valve: Secondary | ICD-10-CM | POA: Insufficient documentation

## 2019-06-17 DIAGNOSIS — I359 Nonrheumatic aortic valve disorder, unspecified: Secondary | ICD-10-CM | POA: Diagnosis present

## 2019-06-17 DIAGNOSIS — I119 Hypertensive heart disease without heart failure: Secondary | ICD-10-CM | POA: Diagnosis not present

## 2019-06-17 DIAGNOSIS — I080 Rheumatic disorders of both mitral and aortic valves: Secondary | ICD-10-CM | POA: Insufficient documentation

## 2019-06-17 DIAGNOSIS — I4891 Unspecified atrial fibrillation: Secondary | ICD-10-CM | POA: Diagnosis not present

## 2019-06-17 NOTE — Progress Notes (Signed)
Echocardiogram 2D Echocardiogram has been performed.  Oneal Deputy Aaron Cole 06/17/2019, 3:27 PM

## 2019-07-20 ENCOUNTER — Other Ambulatory Visit: Payer: Self-pay

## 2019-07-20 ENCOUNTER — Encounter: Payer: Self-pay | Admitting: Family Medicine

## 2019-07-20 ENCOUNTER — Ambulatory Visit (INDEPENDENT_AMBULATORY_CARE_PROVIDER_SITE_OTHER): Payer: Medicare Other | Admitting: Family Medicine

## 2019-07-20 VITALS — BP 132/82 | HR 82 | Temp 97.8°F | Ht 70.0 in | Wt 220.2 lb

## 2019-07-20 DIAGNOSIS — Z905 Acquired absence of kidney: Secondary | ICD-10-CM | POA: Diagnosis not present

## 2019-07-20 DIAGNOSIS — Z85528 Personal history of other malignant neoplasm of kidney: Secondary | ICD-10-CM

## 2019-07-20 DIAGNOSIS — N433 Hydrocele, unspecified: Secondary | ICD-10-CM | POA: Diagnosis not present

## 2019-07-20 DIAGNOSIS — R1310 Dysphagia, unspecified: Secondary | ICD-10-CM

## 2019-07-20 MED ORDER — OMEPRAZOLE 40 MG PO CPDR: 40.0000 mg | DELAYED_RELEASE_CAPSULE | Freq: Every day | ORAL | 3 refills | Status: DC

## 2019-07-20 NOTE — Progress Notes (Addendum)
This visit was conducted in person.  BP 132/82 (BP Location: Left Arm, Patient Position: Sitting, Cuff Size: Large)   Pulse 82   Temp 97.8 F (36.6 C) (Temporal)   Ht 5\' 10"  (1.778 m)   Wt 220 lb 3 oz (99.9 kg)   SpO2 97%   BMI 31.59 kg/m    CC: check spot on scrotum Subjective:    Patient ID: Aaron Cole, male    DOB: [birth year], 69 y.o.   MRN: [id number]  HPI: Aaron Cole is a 69 y.o. male presenting on 07/20/2019 for Cyst (C/o growth on right testicle.  Was seen for previously.  Mass has increased in size and is painful. )   Seen 2014 for similar issue - at that time thought hydrocele - scrotal US 2014 showed large R hydrocele that improved over time.   This past fall again noted R scrotal swelling, now with second mass superior to initial hydroceles. No abd pain, n/v/d/constipation. Some GERD managed with PRN tums - wakes him up in the middle of the night 2-3 times a week.   Notes recent dysphagia over the past year - sometimes has to stop eating to allow food to go down. Only to solids. No early satiety, unexpected weight loss, odynophagia, fevers/chills, night sweats.   Regular heavy straining at work - 50 lb bags (fertilizer and seed). He is a Psychologist, sport and exercise.      Relevant past medical, surgical, family and social history reviewed and updated as indicated. Interim medical history since our last visit reviewed. Allergies and medications reviewed and updated. Outpatient Medications Prior to Visit  Medication Sig Dispense Refill  . amLODipine (NORVASC) 2.5 MG tablet Take 1 tablet (2.5 mg total) by mouth daily. 90 tablet 3  . aspirin 81 MG tablet Take 81 mg by mouth daily.    . Cholecalciferol 25 MCG (1000 UT) capsule Take 100 Units by mouth daily.    . fluticasone (FLONASE) 50 MCG/ACT nasal spray INHALE 2 SPRAYS INTO EACH NOSTRIL EVERY DAY. 16 g 3  . Multiple Vitamins-Minerals (MULTIVITAMIN ADULT PO) Take 1 tablet by mouth daily.     . Omega-3 Fatty Acids (FISH OIL) 1000  MG CAPS Take 1 capsule (1,000 mg total) by mouth daily.  0  . sildenafil (VIAGRA) 100 MG tablet Take 0.5-1 tablets (50-100 mg total) by mouth daily as needed for erectile dysfunction. 10 tablet 6  . telmisartan (MICARDIS) 80 MG tablet TAKE ONE-HALF (1/2) OF A TABLET BY MOUTH TWICE DAILY. 90 tablet 3   No facility-administered medications prior to visit.     Per HPI unless specifically indicated in ROS section below Review of Systems Objective:    BP 132/82 (BP Location: Left Arm, Patient Position: Sitting, Cuff Size: Large)   Pulse 82   Temp 97.8 F (36.6 C) (Temporal)   Ht 5\' 10"  (1.778 m)   Wt 220 lb 3 oz (99.9 kg)   SpO2 97%   BMI 31.59 kg/m   Wt Readings from Last 3 Encounters:  07/20/19 220 lb 3 oz (99.9 kg)  04/23/19 211 lb 6.4 oz (95.9 kg)  03/04/19 204 lb 6.4 oz (92.7 kg)    Physical Exam Vitals and nursing note reviewed.  Constitutional:      Appearance: Normal appearance. He is not ill-appearing.  Abdominal:     General: Abdomen is flat. Bowel sounds are normal. There is no distension.     Palpations: Abdomen is soft. There is no mass.     Tenderness:  There is no abdominal tenderness. There is no right CVA tenderness, left CVA tenderness, guarding or rebound. Negative signs include Murphy's sign.     Hernia: No hernia is present. There is no hernia in the left inguinal area or right inguinal area.  Genitourinary:    Penis: Normal.      Testes:        Right: Mass, tenderness, swelling and testicular hydrocele present.        Left: Mass, tenderness, swelling or testicular hydrocele not present.     Comments: Huge R cystic mass encompassing all of R scrotum with extension into inguinal region, uncomfortable to palpation Lymphadenopathy:     Lower Body: No right inguinal adenopathy. No left inguinal adenopathy.  Neurological:     Mental Status: He is alert.       Lab Results  Component Value Date   CREATININE 1.17 01/08/2019   BUN 22 01/08/2019   NA 135  01/08/2019   K 4.7 01/08/2019   CL 104 01/08/2019   CO2 24 01/08/2019    Assessment & Plan:  This visit occurred during the SARS-CoV-2 public health emergency.  Safety protocols were in place, including screening questions prior to the visit, additional usage of staff PPE, and extensive cleaning of exam room while observing appropriate contact time as indicated for disinfecting solutions.   Problem List Items Addressed This Visit    S/p nephrectomy   Right hydrocele - Primary    H/o this back 2014, saw urology 2015, had actually improved. Now over the last 4 months notes enlarging, uncomfortable, tender. Exam with markedly enlarged R cystic mass in scrotum most consistent with large hydrocele, not consistent with hernia. Will update scrotal US and refer back to urology for definitive treatment. Pt agrees with plan.       Relevant Orders   Ambulatory referral to Urology   US Scrotum   US SCROTUM W/DOPPLER   History of renal cell carcinoma   Dysphagia    Endorses worsening solid food dysphagia over the past year, associated with intermittent GERD flares, without red flags - will start omeprazole 40mg  daily x 3 wks then PRN and refer to Dr Allen Norris for further evaluation. Pt agrees with plan.       Relevant Orders   Ambulatory referral to Gastroenterology       Meds ordered this encounter  Medications  . omeprazole (PRILOSEC) 40 MG capsule    Sig: Take 1 capsule (40 mg total) by mouth daily.    Dispense:  30 capsule    Refill:  3   Orders Placed This Encounter  Procedures  . US Scrotum    NO DOPPLER PER PROVIDER    Standing Status:   Future    Standing Expiration Date:   09/16/2020    Order Specific Question:   Reason for Exam (SYMPTOM  OR DIAGNOSIS REQUIRED)    Answer:   enlarging R testicular mass    Order Specific Question:   Preferred imaging location?    Answer:   [facility]  . US SCROTUM W/DOPPLER    Standing Status:   Future    Standing Expiration Date:    09/19/2020    Order Specific Question:   Reason for Exam (SYMPTOM  OR DIAGNOSIS REQUIRED)    Answer:   R scrotal mass    Order Specific Question:   Preferred imaging location?    Answer:   [facility]  . Ambulatory referral to Urology    Referral Priority:  Routine    Referral Type:   Consultation    Referral Reason:   Specialty Services Required    Requested Specialty:   Urology    Number of Visits Requested:   1  . Ambulatory referral to Gastroenterology    Referral Priority:   Routine    Referral Type:   Consultation    Referral Reason:   Specialty Services Required    Number of Visits Requested:   1    Patient Instructions  For trouble swallowing - start omeprazole 40mg  daily for 2-3 weeks then as needed. We will also refer you back to GI.  For scrotal swelling - we will order scrotal ultrasound and refer you to urology.    Follow up plan: Return if symptoms worsen or fail to improve.  [name], MD

## 2019-07-20 NOTE — Patient Instructions (Addendum)
For trouble swallowing - start omeprazole 40mg  daily for 2-3 weeks then as needed. We will also refer you back to GI.  For scrotal swelling - we will order scrotal ultrasound and refer you to urology.

## 2019-07-21 DIAGNOSIS — R1310 Dysphagia, unspecified: Secondary | ICD-10-CM | POA: Insufficient documentation

## 2019-07-21 NOTE — Assessment & Plan Note (Addendum)
H/o this back 2014, saw urology 2015, had actually improved. Now over the last 4 months notes enlarging, uncomfortable, tender. Exam with markedly enlarged R cystic mass in scrotum most consistent with large hydrocele, not consistent with hernia. Will update scrotal US and refer back to urology for definitive treatment. Pt agrees with plan.

## 2019-07-21 NOTE — Assessment & Plan Note (Addendum)
Endorses worsening solid food dysphagia over the past year, associated with intermittent GERD flares, without red flags - will start omeprazole 40mg  daily x 3 wks then PRN and refer to Dr Allen Norris for further evaluation. Pt agrees with plan.

## 2019-07-23 NOTE — Addendum Note (Signed)
Addended by: [name] on: 07/23/2019 08:54 AM   Modules accepted: Orders

## 2019-07-24 ENCOUNTER — Ambulatory Visit: Payer: Medicare Other

## 2019-07-29 ENCOUNTER — Other Ambulatory Visit: Payer: Self-pay

## 2019-07-29 ENCOUNTER — Ambulatory Visit
Admission: RE | Admit: 2019-07-29 | Discharge: 2019-07-29 | Disposition: A | Discharge status: Home / Self Care | Payer: Medicare Other | Source: Ambulatory Visit | Attending: Family Medicine | Admitting: Family Medicine

## 2019-07-29 DIAGNOSIS — N433 Hydrocele, unspecified: Secondary | ICD-10-CM | POA: Diagnosis not present

## 2019-08-19 ENCOUNTER — Ambulatory Visit: Payer: Self-pay | Admitting: Urology

## 2019-08-26 ENCOUNTER — Encounter: Payer: Self-pay | Admitting: Urology

## 2019-08-26 ENCOUNTER — Other Ambulatory Visit: Payer: Self-pay

## 2019-08-26 ENCOUNTER — Ambulatory Visit (INDEPENDENT_AMBULATORY_CARE_PROVIDER_SITE_OTHER): Payer: Medicare Other | Admitting: Urology

## 2019-08-26 VITALS — BP 150/92 | HR 73 | Ht 71.0 in | Wt 219.9 lb

## 2019-08-26 DIAGNOSIS — N432 Other hydrocele: Secondary | ICD-10-CM | POA: Diagnosis not present

## 2019-08-26 NOTE — Progress Notes (Signed)
08/26/19 3:09 PM   Aaron Cole 1950/09/27 [id number]  CC: Right hydrocele  HPI: I saw Aaron Cole in urology clinic today for evaluation of a large right hydrocele.  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  He reports a long 5+ year history of a large right-sided hydrocele.  This has enlarged over the last few months and become increasingly bulky and bothersome.  He denies any difficulty with urination.  A scrotal ultrasound performed on 07/30/2019 showed the large right hydrocele measuring 13 cm in size similar to prior ultrasound from 2014.  There is also a 3 cm septated left epididymal cyst, but no testicular masses or torsion.  He denies any complaints or problems from the left-sided epididymal cyst, and is only interested in having the right-sided hydrocele treated.   PMH: Past Medical History:  Diagnosis Date  . CAD (coronary artery disease) 2013   per CT scan  - brings CD of CT, no report - will need [facility] records of CT scan report  . CKD (chronic kidney disease) stage 3, GFR 30-59 ml/min 2015   iatrogenic (decreased nephron mass) established with Dr[facility])  . Diverticulosis    by CT scan  . Hepatitis C virus infection without hepatic coma 03/02/2019  . History of hepatitis B virus infection    spontaneously cleared  . HTN (hypertension)   . Renal cell carcinoma ([place]) 2013   clear cell s/p R nephrectomy  . S/p nephrectomy 2013   [facility]  . Sinus headache    recurrent    Surgical History: Past Surgical History:  Procedure Laterality Date  . BENTALL PROCEDURE N/A 07/05/2015   Procedure: BENTALL PROCEDURE UTILIZING A 28 X 10 MM WOVEN DOUBLE VELOUR VASCULAR GRAFT AND A 28MM GELWEAVE WOVEN VASCULAR GRAFT. AORTIC VALVE REPLACEMENT WITH 25MM AORTIC MAGNA EASE  PERICARDIAL AORTIC VALVE.;  Surgeon: [name], MD;  Location: [facility] OR;  Service: Open Heart Surgery;  Laterality: N/A;  . COLONOSCOPY  11/2008   WNL records in chart [facility])  . COLONOSCOPY WITH PROPOFOL N/A 02/24/2019   TA, HP, rpt 5 yrs ([facility])  . CORONARY ANGIOPLASTY    . NASAL SINUS SURGERY  1978   sinus surgery done at [facility]  . NEPHRECTOMY Right 12/2011   [facility], R for RCC  . rectal fistula repair  1980s  . TEE WITHOUT CARDIOVERSION N/A 07/05/2015   Procedure: TRANSESOPHAGEAL ECHOCARDIOGRAM (TEE);  Surgeon: [name], MD;  Location: [facility];  Service: Open Heart Surgery;  Laterality: N/A;   Family History: Family History  Problem Relation Age of Onset  . Cancer Paternal Grandmother        throat  . Heart disease Mother   . Hypertension Mother   . Heart disease Father   . Diabetes Father   . Colon polyps Father   . Crohn's disease Father   . Coronary artery disease Maternal Uncle        MI  . Cancer Paternal Aunt        breast   . Cancer Paternal Aunt        brain  . Colon polyps Brother   . CAD Maternal Grandfather   . Stroke Neg Hx     Social History:  reports that he has never smoked. He  has never used smokeless tobacco. He reports current alcohol use. He reports that he does not use drugs.  Physical Exam: BP (!) 150/92 (BP Location: Left Arm, Patient Position: Sitting, Cuff Size: Large)   Pulse 73   Ht 5\' 11"  (1.803 m)   Wt 219 lb 14.4 oz (99.7 kg)   BMI 30.67 kg/m    Constitutional:  Alert and oriented, No acute distress. Cardiovascular: No clubbing, cyanosis, or edema. Respiratory: Normal respiratory effort, no increased work of breathing. GI: Abdomen is soft, nontender, nondistended, no abdominal masses GU: Large right-sided hydrocele, 10+ centimeters, left testicle palpable and no masses, 3 cm left epididymal cyst. Lymph: No cervical or inguinal lymphadenopathy. Skin: No rashes, bruises or suspicious lesions. Neurologic: Grossly intact, no focal  deficits, moving all 4 extremities. Psychiatric: Normal mood and affect.  Laboratory Data: Reviewed  Pertinent Imaging: Reviewed see HPI  Assessment & Plan:   In summary, he is a 69 year old male with a very large right-sided hydrocele.  We discussed treatment options including observation, aspiration in clinic, or definitive management with hydrocelectomy in the operating room.  We discussed the risks and benefits of these options at length.  I recommended surgery with hydrocelectomy as the most definitive option, and we discussed the risks of bleeding, infection, recurrence, possible need for temporary drain, and bruising/swelling for 3 to 4 weeks postop.  We also discussed the need to avoid any heavy lifting or strenuous activity for 10 to 14 days after surgery to decrease the risk of bleeding.  Will consult with cardiology to stop baby aspirin prior to surgery if possible.  Schedule right hydrocelectomy  I spent 45 total minutes on the day of the encounter including pre-visit review of the medical record, face-to-face time with the patient, and post visit ordering of labs/imaging/tests.  Nickolas Madrid, MD 08/26/2019  [facility] [address], [place], [place] [address] [phone]

## 2019-08-26 NOTE — Patient Instructions (Signed)
Hydrocele, Adult  A hydrocele is a collection of fluid in the loose pouch of skin that holds the testicles (scrotum). This may happen because:  The amount of fluid produced in the scrotum is not absorbed by the rest of the body.  Fluid from the abdomen fills the scrotum. Normally, the testicles develop in the abdomen then move (drop) into to the scrotum before birth. The tube that the testicles travel through usually closes after the testicles drop. If the tube does not close, fluid from the abdomen can fill the scrotum. This is less common in adults.  What are the causes?  The cause of a hydrocele in adults is usually not known. However, it may be caused by:  An injury to the scrotum.  An infection (epididymitis).  Decreased blood flow to the scrotum.  Twisting of a testicle (testicular torsion).  A birth defect.  A tumor or cancer of the testicle.  What are the signs or symptoms?  A hydrocele feels like a water-filled balloon. It may also feel heavy. Other symptoms include:  Swelling of the scrotum. The swelling may decrease when you lie down. You may also notice more swelling at night than in the morning.  Swelling of the groin.  Mild discomfort in the scrotum.  Pain. This can develop if the hydrocele was caused by infection or twisting. The larger the hydrocele, the more likely you are to have pain.  How is this diagnosed?  This condition may be diagnosed based on:  Physical exam.  Medical history.  You may also have other tests, including:  Imaging tests, such as ultrasound.  Blood or urine tests.  How is this treated?  Most hydroceles go away on their own. If you have no discomfort or pain, your health care provider may suggest close monitoring of your condition (called watch and wait or watchful waiting) until the condition goes away or symptoms develop. If treatment is needed, it may include:  Treating an underlying condition. This may include using an antibiotic medicine to treat an infection.  Surgery to  stop fluid from collecting in the scrotum.  Surgery to drain the fluid. Options include:  Needle aspiration. A needle is used to drain fluid. However, the fluid buildup will come back quickly.  Hydrocelectomy. For this procedure, an incision is made in the scrotum to remove the fluid sac.  Follow these instructions at home:  Watch the hydrocele for any changes.  Take over-the-counter and prescription medicines only as told by your health care provider.  If you were prescribed an antibiotic medicine, use it as told by your health care provider. Do not stop taking the antibiotic even if you start to feel better.  Keep all follow-up visits as told by your health care provider. This is important.  Contact a health care provider if:  You notice any changes in the hydrocele.  The swelling in your scrotum or groin gets worse.  The hydrocele becomes red, firm, painful, or tender to the touch.  You have a fever.  Get help right away if you:  Develop a lot of pain, or your pain becomes worse.  Summary  A hydrocele is a collection of fluid in the loose pouch of skin that holds the testicles (scrotum).  Hydroceles can cause swelling, discomfort, and sometimes pain.  In adults, the cause of a hydrocele usually is not known. However, it is sometimes caused by an infection or a rotation and twisting of the scrotum.  Treatment is usually not   may be given to ease the pain. This information is not intended to replace advice given to you by your health care provider. Make sure you discuss any questions you have with your health care provider. Document Revised: 06/29/2017 Document Reviewed: 06/29/2017 Elsevier Patient Education  2020 Elsevier Inc.    Hydrocelectomy, Adult  A hydrocelectomy is a surgical procedure to remove a collection of fluid (hydrocele) from the scrotum,  which is the pouch that holds the testicles. You may need to have this procedure if a hydrocele is causing painful swelling in your scrotum. Tell a health care provider about:  Any allergies you have.  All medicines you are taking, including vitamins, herbs, eye drops, creams, and over-the-counter medicines.  Any problems you or family members have had with anesthetic medicines.  Any blood disorders you have.  Any surgeries you have had.  Any medical conditions you have. What are the risks? Generally, this is a safe procedure. However, problems may occur, including:  Bleeding into the scrotum (scrotal hematoma).  Damage to nearby structures or organs, including to the testicle or the tube that carries sperm out of the testicle (vas deferens).  Infection.  Allergic reactions to medicines. What happens before the procedure? Staying hydrated Follow instructions from your health care provider about hydration, which may include:  Up to 2 hours before the procedure - you may continue to drink clear liquids, such as water, clear fruit juice, black coffee, and plain tea. Eating and drinking restrictions Follow instructions from your health care provider about eating and drinking, which may include:  8 hours before the procedure - stop eating heavy meals or foods, such as meat, fried foods, or fatty foods.  6 hours before the procedure - stop eating light meals or foods, such as toast or cereal.  6 hours before the procedure - stop drinking milk or drinks that contain milk.  2 hours before the procedure - stop drinking clear liquids. Medicines Ask your health care provider about:  Changing or stopping your regular medicines. This is especially important if you are taking diabetes medicines or blood thinners.  Taking medicines such as aspirin and ibuprofen. These medicines can thin your blood. Do not take these medicines unless your health care provider tells you to take  them.  Taking over-the-counter medicines, vitamins, herbs, and supplements. General instructions  Do not use any products that contain nicotine or tobacco for at least 4 weeks before the procedure. These products include cigarettes, e-cigarettes, and chewing tobacco. If you need help quitting, ask your health care provider.  Plan to have someone take you home from the hospital or clinic.  Plan to have a responsible adult care for you for at least 24 hours after you leave the hospital or clinic. This is important.  Ask your health care provider: ? How your surgery site will be marked. ? What steps will be taken to help prevent infection. These may include:  Removing hair at the surgery site.  Washing skin with a germ-killing soap.  Taking antibiotic medicine. What happens during the procedure?  An IV will be inserted into one of your veins.  You will be given one or more of the following: ? A medicine to make you relax (sedative). ? A medicine to make you fall asleep (general anesthetic).  A small incision will be made through the skin of your scrotum.  Your testicle and the hydrocele will be located, and the hydrocele sac will be opened with an incision.  The   fluid will be drained from the hydrocele. Part of the hydrocele sac may be removed.  The hydrocele will be closed with stitches that dissolve (absorbable sutures). This prevents fluid from building up again.  If your hydrocele is large, you may have a thin, rubber drain placed to allow fluid to drain after the procedure.  The incision in your scrotum will be closed with absorbable sutures, skin glue, or adhesives.  A bandage (dressing) will be placed over the incision. The dressing may be held in place with an athletic support strap (scrotal support). The procedure may vary among health care providers and hospitals. What happens after the procedure?   Your blood pressure, heart rate, breathing rate, and blood oxygen  level will be monitored until you leave the hospital or clinic.  You will be given pain medicine as needed.  Your IV will be removed, and your insertion site will be checked for bleeding.  Do not drive for 24 hours if you were given a sedative during your procedure.  You may need to wear a scrotal support. This holds the dressing in place and supports your scrotum. Summary  A hydrocelectomy is a surgical procedure to remove a collection of fluid (hydrocele) from the scrotum, which is the pouch that holds the testicles. You may need to have this procedure if a hydrocele is causing painful swelling in your scrotum.  During the procedure, the hydrocele will be drained and then closed with stitches that dissolve (absorbable sutures). This prevents fluid from building up again.  If your hydrocele is large, you may have a thin, rubber drain placed to allow fluid to drain after the procedure.  You may need to wear a scrotal support after your procedure. This holds the dressing in place and supports your scrotum. This information is not intended to replace advice given to you by your health care provider. Make sure you discuss any questions you have with your health care provider. Document Revised: 11/11/2018 Document Reviewed: 11/11/2018 Elsevier Patient Education  2020 Elsevier Inc.  

## 2019-08-27 ENCOUNTER — Telehealth: Payer: Self-pay | Admitting: *Deleted

## 2019-08-27 NOTE — Telephone Encounter (Signed)
Follow up  [place] from [facility] called and wanted to provide what type of anesthesia will use for the pt's upcoming surgery.   Anesthesia type : General

## 2019-08-27 NOTE — Telephone Encounter (Signed)
Left voice mail to call back 

## 2019-08-27 NOTE — Telephone Encounter (Signed)
   [facility] Pre-operative Risk Assessment    Request for surgical clearance:  1. What type of surgery is being performed? RIGHT HYDROCELECTOMY   2. When is this surgery scheduled? 09/04/19 OR 09/18/19   3. What type of clearance is required (medical clearance vs. Pharmacy clearance to hold med vs. Both)? MEDICAL  4. Are there any medications that need to be held prior to surgery and how long? ASA  X 7 DAYS PRIOR   5. Practice name and name of physician performing surgery? [facility]; DR. Aaron Edelman SNINSKY   6. What is your office phone number [phone]    7.   What is your office fax number [phone]  8.   Anesthesia type (None, local, MAC, general) ? LEFT MESSAGE TO CALL BACK WITH TYPE OF ANESTHESIA TO BE USED    Aaron Cole 08/27/2019, 3:50 PM  _________________________________________________________________   (provider comments below)

## 2019-08-31 ENCOUNTER — Other Ambulatory Visit: Payer: Self-pay

## 2019-08-31 ENCOUNTER — Ambulatory Visit (INDEPENDENT_AMBULATORY_CARE_PROVIDER_SITE_OTHER): Payer: Medicare Other | Admitting: Gastroenterology

## 2019-08-31 VITALS — BP 146/93 | HR 62 | Temp 97.9°F | Ht 70.0 in | Wt 222.0 lb

## 2019-08-31 DIAGNOSIS — B1920 Unspecified viral hepatitis C without hepatic coma: Secondary | ICD-10-CM

## 2019-08-31 NOTE — Telephone Encounter (Signed)
Left message for the patient to call back and speak to the on-call preop APP of the day 

## 2019-08-31 NOTE — Progress Notes (Signed)
[name] MD, MRCP(U.K) [address]  [place]  [place], [place] [address]  Main: [phone]  Fax: [phone]   Primary Care Physician: [name], MD  Primary Gastroenterologist:  Dr. [name]    Hepatitis C follow-up  HPI: Aaron Cole is a 69 y.o. male    Summary of history :  Initially referred and seen on 02/16/2019.CMP  01/08/2019: AST and ALT at 210 and 307 respectively. Albumin of 4.1. Hepatitis C viral antibody was positive. Viral load of 2.75 million.  Hepatitis B core total antibody positive.  Viral hepatitis and autoimmune hepatitis panel otherwise negative.  Hepatitis A total antibody positive.  01/19/2019 right upper quadrant ultrasound showed cholelithiasis. Status post right nephrectomy. Only recently found that that he had hepatitis C. He does recall that he was checked a year prior and was negative for hepatitis C.  02/20/2019: Ultrasound right upper quadrant with elastography: F2 and F3 fibrosis. 04/03/2019: LFTs normal previously elevated  Interval history10/22/2020-08/31/2019  Completed hepatitis C treatment in early December 2020. 04/23/2019: Hepatic function panel: Normal. No complaints  Current Outpatient Medications  Medication Sig Dispense Refill  . amLODipine (NORVASC) 2.5 MG tablet Take 1 tablet (2.5 mg total) by mouth daily. 90 tablet 3  . aspirin 81 MG tablet Take 81 mg by mouth daily.    . Cholecalciferol 25 MCG (1000 UT) capsule Take 100 Units by mouth daily.    . fluticasone (FLONASE) 50 MCG/ACT nasal spray INHALE 2 SPRAYS INTO EACH NOSTRIL EVERY DAY. 16 g 3  . Multiple Vitamins-Minerals (MULTIVITAMIN ADULT PO) Take 1 tablet by mouth daily.     . Omega-3 Fatty Acids (FISH OIL) 1000 MG CAPS Take 1 capsule (1,000 mg total) by mouth daily.  0  . omeprazole (PRILOSEC) 40 MG capsule Take 1 capsule (40 mg total) by mouth daily. 30 capsule 3  . sildenafil (VIAGRA) 100 MG tablet Take 0.5-1 tablets (50-100 mg total) by  mouth daily as needed for erectile dysfunction. 10 tablet 6  . telmisartan (MICARDIS) 80 MG tablet TAKE ONE-HALF (1/2) OF A TABLET BY MOUTH TWICE DAILY. 90 tablet 3   No current facility-administered medications for this visit.    Allergies as of 08/31/2019 - Review Complete 08/26/2019  Allergen Reaction Noted  . Carvedilol Other (See Comments) 12/19/2016    ROS:  General: Negative for anorexia, weight loss, fever, chills, fatigue, weakness. ENT: Negative for hoarseness, difficulty swallowing , nasal congestion. CV: Negative for chest pain, angina, palpitations, dyspnea on exertion, peripheral edema.  Respiratory: Negative for dyspnea at rest, dyspnea on exertion, cough, sputum, wheezing.  GI: See history of present illness. GU:  Negative for dysuria, hematuria, urinary incontinence, urinary frequency, nocturnal urination.  Endo: Negative for unusual weight change.    Physical Examination:   There were no vitals taken for this visit.  General: Well-nourished, well-developed in no acute distress.  Eyes: No icterus. Conjunctivae pink. Psych: Alert and cooperative, normal mood and affect.   Imaging Studies: No results found.  Assessment and Plan:   Aaron Cole is a 69 y.o. y/o male here to follow-up forhepatitis C genotype 1a, treatment nave, noncirrhotic. 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  LFTs checked on 04/03/2019 showed normal transaminases.  No evidence of hepatitis B reactivation at this point of time.  Plan 1.  Check for SVR in 3 months after completion of treatment.  Repeat LFTs 2.  Repeat hepatitis C viral load in 4 months from  now  Dr [name]  MD,MRCP [facility]) Follow up in 5 months telephone appointment

## 2019-09-01 LAB — HCV RNA QUANT RFLX ULTRA OR GENOTYP: HCV Quant Baseline: NOT DETECTED IU/mL

## 2019-09-01 LAB — CBC
Hematocrit: 39.8 % (ref 37.5–51.0)
Hemoglobin: 14.1 g/dL (ref 13.0–17.7)
MCH: 30.8 pg (ref 26.6–33.0)
MCHC: 35.4 g/dL (ref 31.5–35.7)
MCV: 87 fL (ref 79–97)
Platelets: 182 10*3/uL (ref 150–450)
RBC: 4.58 x10E6/uL (ref 4.14–5.80)
RDW: 13.2 % (ref 11.6–15.4)
WBC: 8.1 10*3/uL (ref 3.4–10.8)

## 2019-09-01 LAB — HEPATIC FUNCTION PANEL
ALT: 12 IU/L (ref 0–44)
AST: 17 IU/L (ref 0–40)
Albumin: 4.3 g/dL (ref 3.8–4.8)
Alkaline Phosphatase: 71 IU/L (ref 39–117)
Bilirubin Total: 0.4 mg/dL (ref 0.0–1.2)
Bilirubin, Direct: 0.13 mg/dL (ref 0.00–0.40)
Total Protein: 7.1 g/dL (ref 6.0–8.5)

## 2019-09-01 NOTE — Telephone Encounter (Signed)
   Primary Cardiologist: Sinclair Grooms, MD  Chart reviewed as part of pre-operative protocol coverage. Patient was contacted 09/01/2019 in reference to pre-operative risk assessment for pending surgery as outlined below.  Aaron Cole was last seen on 10/22/2018 by Dr. Tamala Julian via a telemedicine visit.  Since that day, Aaron Cole has done well from a cardiac standpoint. 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 He has no chest pain or SOB complaints. He can easily complete 4 METs without anginal complaints. He had an echocardiogram 06/2019 which was stable. LHC in 2016 showed normal coronary arteries  Therefore, based on ACC/AHA guidelines, the patient would be at acceptable risk for the planned procedure without further cardiovascular testing.   He can hold aspirin 7 days and should restart when cleared to do so by his surgeon.  I will route this recommendation to the requesting party via Epic fax function and remove from pre-op pool.  Please call with questions.  Abigail Butts, PA-C 09/01/2019, 11:17 AM

## 2019-09-09 ENCOUNTER — Other Ambulatory Visit: Payer: Self-pay

## 2019-09-09 ENCOUNTER — Telehealth: Payer: Self-pay

## 2019-09-09 DIAGNOSIS — R7989 Other specified abnormal findings of blood chemistry: Secondary | ICD-10-CM

## 2019-09-09 DIAGNOSIS — R945 Abnormal results of liver function studies: Secondary | ICD-10-CM

## 2019-09-09 DIAGNOSIS — B1920 Unspecified viral hepatitis C without hepatic coma: Secondary | ICD-10-CM

## 2019-09-09 NOTE — Telephone Encounter (Signed)
Spoke with pt and informed him of lab results and Dr. Georgeann Oppenheim recommendation. Pt understands and agrees.

## 2019-09-09 NOTE — Telephone Encounter (Signed)
-----   Message from [name], MD sent at 09/08/2019 12:21 PM EST ----- Sherald Hess   Inform the LFTs and hepatitis C viral load are normal and no hepatitis C detected that means he is very highly likely cured.  I would suggest that we check it one last time in 4 months for LFTs and hepatitis C viral load .  Dr [name] MD,MRCP [facility]) Gastroenterology/Hepatology Pager: [phone]

## 2019-09-12 NOTE — Progress Notes (Deleted)
Cardiology Office Note:    Date:  09/12/2019   ID:  Aaron Cole, DOB [birth year], MRN [id number]  PCP:  [name], MD  Cardiologist:  Sinclair Grooms, MD   Referring MD: [name], MD   No chief complaint on file.   History of Present Illness:    Aaron Cole is a 69 y.o. male with a hx of aortic valve replacement and repair of ascending aortic aneurysmwith a Bentall procedure January 2017. Postoperative atrial fibrillationhas not recurred. History of nephrectomy for renal cell carcinoma with resultant elevated creatinine levels and "stage III, CKD" and essential hypertension.Marland Kitchen  Past Medical History:  Diagnosis Date  . CAD (coronary artery disease) 2013   per CT scan  - brings CD of CT, no report - will need [facility] records of CT scan report  . CKD (chronic kidney disease) stage 3, GFR 30-59 ml/min 2015   iatrogenic (decreased nephron mass) established with Dr. Alfonse Alpers [facility])  . Diverticulosis    by CT scan  . Hepatitis C virus infection without hepatic coma 03/02/2019  . History of hepatitis B virus infection    spontaneously cleared  . HTN (hypertension)   . Renal cell carcinoma ([place]) 2013   clear cell s/p R nephrectomy  . S/p nephrectomy 2013   [facility]  . Sinus headache    recurrent    Past Surgical History:  Procedure Laterality Date  . BENTALL PROCEDURE N/A 07/05/2015   Procedure: BENTALL PROCEDURE UTILIZING A 28 X 10 MM WOVEN DOUBLE VELOUR VASCULAR GRAFT AND A 28MM GELWEAVE WOVEN VASCULAR GRAFT. 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.;  Surgeon: [name], MD;  Location: [place] OR;  Service: Open Heart Surgery;  Laterality: N/A;  . COLONOSCOPY  11/2008   [facility]NL records in chart [facility])  . COLONOSCOPY WITH PROPOFOL N/A 02/24/2019   TA, HP, rpt 5 yrs ([facility])  . CORONARY ANGIOPLASTY    . NASAL SINUS SURGERY  1978   sinus surgery done at [facility]  . NEPHRECTOMY Right 12/2011   [facility], R for RCC  . rectal fistula  repair  1980s  . TEE WITHOUT CARDIOVERSION N/A 07/05/2015   Procedure: TRANSESOPHAGEAL ECHOCARDIOGRAM (TEE);  Surgeon: [name], MD;  Location: [place];  Service: Open Heart Surgery;  Laterality: N/A;    Current Medications: No outpatient medications have been marked as taking for the 09/14/19 encounter (Appointment) with [name], MD.     Allergies:   Carvedilol   Social History   Socioeconomic History  . Marital status: Single    Spouse name: Not on file  . Number of children: Not on file  . Years of education: Not on file  . Highest education level: Not on file  Occupational History  . Not on file  Tobacco Use  . Smoking status: Never Smoker  . Smokeless tobacco: Never Used  Substance and Sexual Activity  . Alcohol use: Yes    Comment: Socially  . Drug use: Never  . Sexual activity: Yes  Other Topics Concern  . Not on file  Social History Narrative   Caffeine: occasional   Lives alone, 1 cat   Occupation: farming business   Edu: BS   Activity: bikes 3-4x/wk about 1 hour   Diet: good water, fruits/vegetables daily, steak 1x/wk, fish 2x/wk   Social Determinants of Health   Financial Resource Strain:   . Difficulty of Paying Living Expenses:   Food Insecurity:   .  Worried About Charity fundraiser in the Last Year:   . Arboriculturist in the Last Year:   Transportation Needs:   . Film/video editor (Medical):   Marland Kitchen Lack of Transportation (Non-Medical):   Physical Activity:   . Days of Exercise per Week:   . Minutes of Exercise per Session:   Stress:   . Feeling of Stress :   Social Connections:   . Frequency of Communication with Friends and Family:   . Frequency of Social Gatherings with Friends and Family:   . Attends Religious Services:   . Active Member of Clubs or Organizations:   . Attends Archivist Meetings:   Marland Kitchen Marital Status:      Family History: The patient's family history includes CAD in his maternal grandfather; Cancer  in his paternal aunt, paternal aunt, and paternal grandmother; Colon polyps in his brother and father; Coronary artery disease in his maternal uncle; Crohn's disease in his father; Diabetes in his father; Heart disease in his father and mother; Hypertension in his mother. There is no history of Stroke.  ROS:   Please see the history of present illness.    *** All other systems reviewed and are negative.  EKGs/Labs/Other Studies Reviewed:    The following studies were reviewed today: ***  EKG:  EKG ***  Recent Labs: 01/08/2019: BUN 22; Creatinine, Ser 1.17; Potassium 4.7; Sodium 135; TSH 1.88 08/31/2019: ALT 12; Hemoglobin 14.1; Platelets 182  Recent Lipid Panel    Component Value Date/Time   CHOL 176 01/08/2019 1221   TRIG 164.0 (H) 01/08/2019 1221   TRIG 131 06/21/2011 0000   HDL 24.80 (L) 01/08/2019 1221   CHOLHDL 7 01/08/2019 1221   VLDL 32.8 01/08/2019 1221   LDLCALC 118 (H) 01/08/2019 1221   LDLDIRECT 118 06/21/2011 0000    Physical Exam:    VS:  There were no vitals taken for this visit.    Wt Readings from Last 3 Encounters:  08/31/19 222 lb (100.7 kg)  08/26/19 219 lb 14.4 oz (99.7 kg)  07/20/19 220 lb 3 oz (99.9 kg)     GEN: ***. No acute distress HEENT: Normal NECK: No JVD. LYMPHATICS: No lymphadenopathy CARDIAC: *** RRR without murmur, gallop, or edema. VASCULAR: *** Normal Pulses. No bruits. RESPIRATORY:  Clear to auscultation without rales, wheezing or rhonchi  ABDOMEN: Soft, non-tender, non-distended, No pulsatile mass, MUSCULOSKELETAL: No deformity  SKIN: Warm and dry NEUROLOGIC:  Alert and oriented x 3 PSYCHIATRIC:  Normal affect   ASSESSMENT:    1. S/P aortic valve replacement   2. Paroxysmal atrial fibrillation (HCC)   3. Essential hypertension   4. Stage 3 chronic kidney disease, unspecified whether stage 3a or 3b CKD   5. Other hyperlipidemia   6. Chronic combined systolic and diastolic heart failure ([place])   7. Educated about COVID-19 virus  infection    PLAN:    In order of problems listed above:  1. ***   Medication Adjustments/Labs and Tests Ordered: Current medicines are reviewed at length with the patient today.  Concerns regarding medicines are outlined above.  No orders of the defined types were placed in this encounter.  No orders of the defined types were placed in this encounter.   There are no Patient Instructions on file for this visit.   Signed, Sinclair Grooms, MD  09/12/2019 10:20 PM    [place]

## 2019-09-14 ENCOUNTER — Ambulatory Visit: Payer: Medicare Other | Admitting: Interventional Cardiology

## 2019-09-29 ENCOUNTER — Telehealth: Payer: Self-pay | Admitting: Urology

## 2019-09-29 NOTE — Telephone Encounter (Signed)
Spoke to Aaron Cole today to discuss surgery he told me he does want to have  the surgery but he could not give me a date at this time.Pt. states he will call the office when he is ready to schedule.

## 2019-11-01 NOTE — Progress Notes (Signed)
Cardiology Office Note:    Date:  11/02/2019   ID:  Aaron Cole, DOB 07/28/1950, MRN [id number]  PCP:  [name], MD  Cardiologist:  Sinclair Grooms, MD   Referring MD: [name], MD   Chief Complaint  Patient presents with  . Cardiac Valve Problem    Aortic valve replacement  . Hypertension    History of Present Illness:    Aaron Cole is a 69 y.o. male with a hx of  aortic valve replacement and repair of ascending aortic aneurysmwith a Bentall procedure January 2017. Postoperative atrial fibrillationhas not recurred. History of nephrectomy for renal cell carcinoma with resultant elevated creatinine levels and "stage III, CKD" and essential hypertension.Marland Kitchen  He feels well.  No complaints.  He denies orthopnea and PND.  No neurological complaints.  Denies dyspnea on exertion, angina, and peripheral edema.  No limitations in physical activity.  Past Medical History:  Diagnosis Date  . CAD (coronary artery disease) 2013   per CT scan  - brings CD of CT, no report - will need [facility] records of CT scan report  . CKD (chronic kidney disease) stage 3, GFR 30-59 ml/min 2015   iatrogenic (decreased nephron mass) established with [facility]fonse [facility])  . Diverticulosis    by CT scan  . Hepatitis C virus infection without hepatic coma 03/02/2019  . History of hepatitis B virus infection    spontaneously cleared  . HTN (hypertension)   . Renal cell carcinoma ([place]) 2013   clear cell s/p R nephrectomy  . S/p nephrectomy 2013   [facility]  . Sinus headache    recurrent    Past Surgical History:  Procedure Laterality Date  . BENTALL PROCEDURE N/A 07/05/2015   Procedure: BENTALL PROCEDURE UTILIZING A 28 X 10 MM WOVEN DOUBLE VELOUR VASCULAR GRAFT AND A 28MM GELWEAVE WOVEN VASCULAR GRAFT. 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.;  Surgeon: [name], MD;  Location: [place] OR;  Service: Open Heart Surgery;  Laterality: N/A;  . COLONOSCOPY   11/2008   WNL records in chart [facility])  . COLONOSCOPY WITH PROPOFOL N/A 02/24/2019   TA, HP, rpt 5 yrs ([facility])  . CORONARY ANGIOPLASTY    . NASAL SINUS SURGERY  1978   [facility] at [facility]  . NEPHRECTOMY Right 12/2011   [facility], R for RCC  . rectal fistula repair  1980s  . TEE WITHOUT CARDIOVERSION N/A 07/05/2015   Procedure: TRANSESOPHAGEAL ECHOCARDIOGRAM (TEE);  Surgeon: [name], MD;  Location: [place];  Service: Open Heart Surgery;  Laterality: N/A;    Current Medications: Current Meds  Medication Sig  . aspirin 81 MG tablet Take 81 mg by mouth daily.  . Cholecalciferol 25 MCG (1000 UT) capsule Take 100 Units by mouth daily.  . fluticasone (FLONASE) 50 MCG/ACT nasal spray INHALE 2 SPRAYS INTO EACH NOSTRIL EVERY DAY.  . Multiple Vitamins-Minerals (MULTIVITAMIN ADULT PO) Take 1 tablet by mouth daily.   . Omega-3 Fatty Acids (FISH OIL) 1000 MG CAPS Take 1 capsule (1,000 mg total) by mouth daily.  Marland Kitchen omeprazole (PRILOSEC) 40 MG capsule Take 1 capsule (40 mg total) by mouth daily.  . sildenafil (VIAGRA) 100 MG tablet Take 0.5-1 tablets (50-100 mg total) by mouth daily as needed for erectile dysfunction.  Marland Kitchen telmisartan (MICARDIS) 80 MG tablet TAKE ONE-HALF (1/2) OF A TABLET BY MOUTH TWICE DAILY.  . [DISCONTINUED] amLODipine (NORVASC) 2.5 MG tablet Take 1 tablet (2.5 mg  total) by mouth daily.     Allergies:   Carvedilol   Social History   Socioeconomic History  . Marital status: Single    Spouse name: Not on file  . Number of children: Not on file  . Years of education: Not on file  . Highest education level: Not on file  Occupational History  . Not on file  Tobacco Use  . Smoking status: Never Smoker  . Smokeless tobacco: Never Used  Substance and Sexual Activity  . Alcohol use: Yes    Comment: Socially  . Drug use: Never  . Sexual activity: Yes  Other Topics Concern  . Not on file  Social History Narrative   Caffeine: occasional   Lives alone, 1 cat   Occupation:  farming business   Edu: BS   Activity: bikes 3-4x/wk about 1 hour   Diet: good water, fruits/vegetables daily, steak 1x/wk, fish 2x/wk   Social Determinants of Health   Financial Resource Strain:   . Difficulty of Paying Living Expenses:   Food Insecurity:   . Worried About Charity fundraiser in the Last Year:   . Arboriculturist in the Last Year:   Transportation Needs:   . Film/video editor (Medical):   Marland Kitchen Lack of Transportation (Non-Medical):   Physical Activity:   . Days of Exercise per Week:   . Minutes of Exercise per Session:   Stress:   . Feeling of Stress :   Social Connections:   . Frequency of Communication with Friends and Family:   . Frequency of Social Gatherings with Friends and Family:   . Attends Religious Services:   . Active Member of Clubs or Organizations:   . Attends Archivist Meetings:   Marland Kitchen Marital Status:      Family History: The patient's family history includes CAD in his maternal grandfather; Cancer in his paternal aunt, paternal aunt, and paternal grandmother; Colon polyps in his brother and father; Coronary artery disease in his maternal uncle; Crohn's disease in his father; Diabetes in his father; Heart disease in his father and mother; Hypertension in his mother. There is no history of Stroke.  ROS:   Please see the history of present illness.    No complaints.  Does not monitor blood pressure at home.  All other systems reviewed and are negative.  EKGs/Labs/Other Studies Reviewed:    The following studies were reviewed today:  2D Doppler echocardiogram December 2020: 1. Left ventricular ejection fraction, by visual estimation, is 50 to  55%. The left ventricle has normal function. There is no left ventricular  hypertrophy.  2. Left ventricular diastolic parameters are consistent with Grade I  diastolic dysfunction (impaired relaxation).  3. The left ventricle has no regional wall motion abnormalities.  4. Global right  ventricle has normal systolic function.The right  ventricular size is normal. No increase in right ventricular wall  thickness.  5. Left atrial size was moderately dilated.  6. Right atrial size was normal.  7. Moderate to severe mitral annular calcification.  8. The mitral valve is normal in structure. Mild mitral valve  regurgitation. No evidence of mitral stenosis.  9. The tricuspid valve is normal in structure. Tricuspid valve  regurgitation is trivial.  10. Aortic valve regurgitation is not visualized. No evidence of aortic  valve sclerosis or stenosis.  11. The pulmonic valve was normal in structure. Pulmonic valve  regurgitation is trivial.  12. Normal pulmonary artery systolic pressure.  13. 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  With QRS duration 146 ms.  Recent Labs: 01/08/2019: BUN 22; Creatinine, Ser 1.17; Potassium 4.7; Sodium 135; TSH 1.88 08/31/2019: ALT 12; Hemoglobin 14.1; Platelets 182  Recent Lipid Panel    Component Value Date/Time   CHOL 176 01/08/2019 1221   TRIG 164.0 (H) 01/08/2019 1221   TRIG 131 06/21/2011 0000   HDL 24.80 (L) 01/08/2019 1221   CHOLHDL 7 01/08/2019 1221   VLDL 32.8 01/08/2019 1221   LDLCALC 118 (H) 01/08/2019 1221   LDLDIRECT 118 06/21/2011 0000    Physical Exam:    VS:  BP (!) 144/94   Pulse 65   Ht 5\' 11"  (1.803 m)   Wt 220 lb 3.2 oz (99.9 kg)   BMI 30.71 kg/m     Wt Readings from Last 3 Encounters:  11/02/19 220 lb 3.2 oz (99.9 kg)  08/31/19 222 lb (100.7 kg)  08/26/19 219 lb 14.4 oz (99.7 kg)     GEN: Moderate obesity. No acute distress HEENT: Normal NECK: No JVD. LYMPHATICS: No lymphadenopathy CARDIAC: 2/6 systolic murmur without diastolic aortic murmur.  RRR without without apical systolic murmur, gallop, or edema. VASCULAR:   Normal Pulses. No bruits. RESPIRATORY:  Clear to auscultation without rales, wheezing or rhonchi  ABDOMEN: Soft, non-tender, non-distended, No pulsatile mass, MUSCULOSKELETAL: No deformity  SKIN: Warm and dry NEUROLOGIC:  Alert and oriented x 3 PSYCHIATRIC:  Normal affect   ASSESSMENT:    1. S/P aortic valve replacement   2. Paroxysmal atrial fibrillation (HCC)   3. Essential hypertension   4. Stage 3 chronic kidney disease, unspecified whether stage 3a or 3b CKD   5. Chronic combined systolic and diastolic heart failure ([place])   6. Educated about COVID-19 virus infection    PLAN:    In order of problems listed above:  1. Valve is functioning normally.  Auscultation demonstrates a soft systolic right upper sternal murmur.  No diastolic murmur.   2. No recurrence of atrial fibrillation.   3. Blood pressure is elevated.  At check-in 144/94 mmHg and subsequently about the same by my measurement.  Increase amlodipine to 5 mg/day.  Monitor blood pressure independently at home 3 to 4 hours after morning medications.  Target 130/80 mmHg.  He will be responsible for letting us know if the blood pressure is consistently above the set target. 4. He is status post nephrectomy.  Creatinine is in the range of CKD stage III.  He is followed by nephrology and [facility]. 5. LVEF on the current echo is back in the low normal range of 55%.  Some of the change in LV qualitative assessment could be interventricular conduction delay with dyssynchrony.  This will need to be kept in mind.  Overall education and awareness concerning primary/secondary risk prevention was discussed in detail: LDL less than 70, hemoglobin A1c less than 7, blood pressure target less than 130/80 mmHg, >150 minutes of moderate aerobic activity per week, avoidance of smoking, weight control (via diet and exercise), and continued surveillance/management of/for obstructive sleep apnea.     Medication Adjustments/Labs and Tests  Ordered: Current medicines are reviewed at length with the patient today.  Concerns regarding medicines are outlined above.  Orders Placed This Encounter  Procedures  . EKG 12-Lead   Meds ordered this encounter  Medications  .  amLODipine (NORVASC) 5 MG tablet    Sig: Take 1 tablet (5 mg total) by mouth daily.    Dispense:  90 tablet    Refill:  3    Dose change    Patient Instructions  Medication Instructions:  1) INCREASE Amlodipine to 5mg  once daily.  Monitor your blood pressure 3-5 times per month.  Make sure it is at least 2-3 hours after medication.  Let us know if your blood pressure is consistently over 130/80.  *If you need a refill on your cardiac medications before your next appointment, please call your pharmacy*   Lab Work: None If you have labs (blood work) drawn today and your tests are completely normal, you will receive your results only by: Marland Kitchen MyChart Message (if you have MyChart) OR . A paper copy in the mail If you have any lab test that is abnormal or we need to change your treatment, we will call you to review the results.   Testing/Procedures: None   Follow-Up: At [facility], you and your health needs are our priority.  As part of our continuing mission to provide you with exceptional heart care, we have created designated Provider Care Teams.  These Care Teams include your primary Cardiologist (physician) and Advanced Practice Providers (APPs -  Physician Assistants and Nurse Practitioners) who all work together to provide you with the care you need, when you need it.  We recommend signing up for the patient portal called "MyChart".  Sign up information is provided on this After Visit Summary.  MyChart is used to connect with patients for Virtual Visits (Telemedicine).  Patients are able to view lab/test results, encounter notes, upcoming appointments, etc.  Non-urgent messages can be sent to your provider as well.   To learn more about what you can do  with MyChart, go to [email or web address].    Your next appointment:   12 month(s)  The format for your next appointment:   In Person  Provider:   You may see Sinclair Grooms, MD or one of the following Advanced Practice Providers on your designated Care Team:    Truitt Merle, NP  Cecilie Kicks, NP  Kathyrn Drown, NP    Other Instructions      Signed, Sinclair Grooms, MD  11/02/2019 4:45 PM    [place]

## 2019-11-02 ENCOUNTER — Other Ambulatory Visit: Payer: Self-pay

## 2019-11-02 ENCOUNTER — Encounter: Payer: Self-pay | Admitting: Interventional Cardiology

## 2019-11-02 ENCOUNTER — Ambulatory Visit (INDEPENDENT_AMBULATORY_CARE_PROVIDER_SITE_OTHER): Payer: Medicare Other | Admitting: Interventional Cardiology

## 2019-11-02 VITALS — BP 144/94 | HR 65 | Ht 71.0 in | Wt 220.2 lb

## 2019-11-02 DIAGNOSIS — N183 Chronic kidney disease, stage 3 unspecified: Secondary | ICD-10-CM

## 2019-11-02 DIAGNOSIS — Z7189 Other specified counseling: Secondary | ICD-10-CM

## 2019-11-02 DIAGNOSIS — Z952 Presence of prosthetic heart valve: Secondary | ICD-10-CM | POA: Diagnosis not present

## 2019-11-02 DIAGNOSIS — I5042 Chronic combined systolic (congestive) and diastolic (congestive) heart failure: Secondary | ICD-10-CM | POA: Diagnosis not present

## 2019-11-02 DIAGNOSIS — I10 Essential (primary) hypertension: Secondary | ICD-10-CM

## 2019-11-02 DIAGNOSIS — I480 Paroxysmal atrial fibrillation: Secondary | ICD-10-CM | POA: Diagnosis not present

## 2019-11-02 MED ORDER — AMLODIPINE BESYLATE 5 MG PO TABS: 5.0000 mg | ORAL_TABLET | Freq: Every day | ORAL | 3 refills | Status: DC

## 2019-11-02 NOTE — Patient Instructions (Signed)
Medication Instructions:  1) INCREASE Amlodipine to 5mg  once daily.  Monitor your blood pressure 3-5 times per month.  Make sure it is at least 2-3 hours after medication.  Let us know if your blood pressure is consistently over 130/80.  *If you need a refill on your cardiac medications before your next appointment, please call your pharmacy*   Lab Work: None If you have labs (blood work) drawn today and your tests are completely normal, you will receive your results only by: Marland Kitchen MyChart Message (if you have MyChart) OR . A paper copy in the mail If you have any lab test that is abnormal or we need to change your treatment, we will call you to review the results.   Testing/Procedures: None   Follow-Up: At [facility], you and your health needs are our priority.  As part of our continuing mission to provide you with exceptional heart care, we have created designated Provider Care Teams.  These Care Teams include your primary Cardiologist (physician) and Advanced Practice Providers (APPs -  Physician Assistants and Nurse Practitioners) who all work together to provide you with the care you need, when you need it.  We recommend signing up for the patient portal called "MyChart".  Sign up information is provided on this After Visit Summary.  MyChart is used to connect with patients for Virtual Visits (Telemedicine).  Patients are able to view lab/test results, encounter notes, upcoming appointments, etc.  Non-urgent messages can be sent to your provider as well.   To learn more about what you can do with MyChart, go to [email or web address].    Your next appointment:   12 month(s)  The format for your next appointment:   In Person  Provider:   You may see Sinclair Grooms, MD or one of the following Advanced Practice Providers on your designated Care Team:    Truitt Merle, NP  Cecilie Kicks, NP  Kathyrn Drown, NP    Other Instructions

## 2019-12-11 ENCOUNTER — Other Ambulatory Visit: Payer: Self-pay | Admitting: Interventional Cardiology

## 2020-01-11 ENCOUNTER — Ambulatory Visit: Payer: Medicare Other

## 2020-01-13 ENCOUNTER — Encounter: Payer: Medicare Other | Admitting: Family Medicine

## 2020-01-15 ENCOUNTER — Other Ambulatory Visit: Payer: Self-pay

## 2020-01-15 ENCOUNTER — Other Ambulatory Visit (INDEPENDENT_AMBULATORY_CARE_PROVIDER_SITE_OTHER): Payer: Medicare Other

## 2020-01-15 ENCOUNTER — Other Ambulatory Visit: Payer: Self-pay | Admitting: Family Medicine

## 2020-01-15 DIAGNOSIS — N183 Chronic kidney disease, stage 3 unspecified: Secondary | ICD-10-CM | POA: Diagnosis not present

## 2020-01-15 DIAGNOSIS — E7849 Other hyperlipidemia: Secondary | ICD-10-CM

## 2020-01-15 DIAGNOSIS — Z125 Encounter for screening for malignant neoplasm of prostate: Secondary | ICD-10-CM | POA: Diagnosis not present

## 2020-01-15 LAB — CBC WITH DIFFERENTIAL/PLATELET
Basophils Absolute: 0 10*3/uL (ref 0.0–0.1)
Basophils Relative: 0.4 % (ref 0.0–3.0)
Eosinophils Absolute: 0.2 10*3/uL (ref 0.0–0.7)
Eosinophils Relative: 2.3 % (ref 0.0–5.0)
HCT: 43 % (ref 39.0–52.0)
Hemoglobin: 14.8 g/dL (ref 13.0–17.0)
Lymphocytes Relative: 41.6 % (ref 12.0–46.0)
Lymphs Abs: 2.8 10*3/uL (ref 0.7–4.0)
MCHC: 34.4 g/dL (ref 30.0–36.0)
MCV: 86.8 fl (ref 78.0–100.0)
Monocytes Absolute: 0.5 10*3/uL (ref 0.1–1.0)
Monocytes Relative: 7.7 % (ref 3.0–12.0)
Neutro Abs: 3.2 10*3/uL (ref 1.4–7.7)
Neutrophils Relative %: 48 % (ref 43.0–77.0)
Platelets: 162 10*3/uL (ref 150.0–400.0)
RBC: 4.96 Mil/uL (ref 4.22–5.81)
RDW: 13.6 % (ref 11.5–15.5)
WBC: 6.7 10*3/uL (ref 4.0–10.5)

## 2020-01-15 LAB — COMPREHENSIVE METABOLIC PANEL
ALT: 11 U/L (ref 0–53)
AST: 17 U/L (ref 0–37)
Albumin: 4.3 g/dL (ref 3.5–5.2)
Alkaline Phosphatase: 68 U/L (ref 39–117)
BUN: 30 mg/dL — ABNORMAL HIGH (ref 6–23)
CO2: 25 mEq/L (ref 19–32)
Calcium: 9.5 mg/dL (ref 8.4–10.5)
Chloride: 106 mEq/L (ref 96–112)
Creatinine, Ser: 1.34 mg/dL (ref 0.40–1.50)
GFR: 53.11 mL/min — ABNORMAL LOW (ref >60.00)
Glucose, Bld: 102 mg/dL — ABNORMAL HIGH (ref 70–99)
Potassium: 4.1 mEq/L (ref 3.5–5.1)
Sodium: 138 mEq/L (ref 135–145)
Total Bilirubin: 0.9 mg/dL (ref 0.2–1.2)
Total Protein: 7.2 g/dL (ref 6.0–8.3)

## 2020-01-15 LAB — LIPID PANEL
Cholesterol: 184 mg/dL (ref 0–200)
HDL: 26.8 mg/dL — ABNORMAL LOW (ref >39.00)
LDL Cholesterol: 123 mg/dL — ABNORMAL HIGH (ref 0–99)
NonHDL: 157.49
Total CHOL/HDL Ratio: 7
Triglycerides: 170 mg/dL — ABNORMAL HIGH (ref 0.0–149.0)
VLDL: 34 mg/dL (ref 0.0–40.0)

## 2020-01-15 LAB — PSA, MEDICARE: PSA: 1.24 ng/ml (ref 0.10–4.00)

## 2020-01-15 LAB — VITAMIN D 25 HYDROXY (VIT D DEFICIENCY, FRACTURES): VITD: 33 ng/mL (ref 30.00–100.00)

## 2020-01-18 NOTE — Telephone Encounter (Signed)
Patient would like a call to schedule surgery with Dr Diamantina Providence at [phone].

## 2020-01-19 ENCOUNTER — Telehealth: Payer: Self-pay | Admitting: *Deleted

## 2020-01-19 NOTE — Telephone Encounter (Signed)
   [facility] Pre-operative Risk Assessment    HEARTCARE STAFF: - Please ensure there is not already an duplicate clearance open for this procedure. - Under Visit Info/Reason for Call, type in Other and utilize the format Clearance MM/DD/YY or Clearance TBD. Do not use dashes or single digits. - If request is for dental extraction, please clarify the # of teeth to be extracted.  Request for surgical clearance: SEE CLEARANCE NOTES FROM 08/27/19.   1. What type of surgery is being performed? RIGHT HYDROCELECTOMY   2. When is this surgery scheduled? 02/05/20   3. What type of clearance is required (medical clearance vs. Pharmacy clearance to hold med vs. Both)? MEDICAL  4. Are there any medications that need to be held prior to surgery and how long? ASA x 7 DAYS PRIOR TO SURGERY   5. Practice name and name of physician performing surgery? [facility]; DR. Aaron Edelman SNINSKY   6. What is the office phone number? [phone]   7.   What is the office fax number? [phone] ATTN: AMY  8.   Anesthesia type (None, local, MAC, general) ? GENERAL   Aaron Cole 01/19/2020, 12:11 PM  _________________________________________________________________   (provider comments below)

## 2020-01-19 NOTE — Telephone Encounter (Signed)
   Primary Cardiologist: Sinclair Grooms, MD  Chart reviewed and patient contacted today by phone as part of pre-operative protocol coverage. Given past medical history and time since last visit, based on ACC/AHA guidelines, Aaron Cole would be at acceptable risk for the planned procedure without further cardiovascular testing.  Ok to hold aspirin 5-7 days pre op if needed.    I will route this recommendation to the requesting party via Epic fax function and remove from pre-op pool.  Please call with questions.  Kerin Ransom, PA-C 01/19/2020, 2:09 PM

## 2020-01-20 ENCOUNTER — Other Ambulatory Visit: Payer: Medicare Other

## 2020-01-22 ENCOUNTER — Other Ambulatory Visit: Payer: Self-pay

## 2020-01-22 ENCOUNTER — Other Ambulatory Visit: Payer: Medicare Other

## 2020-01-22 ENCOUNTER — Ambulatory Visit (INDEPENDENT_AMBULATORY_CARE_PROVIDER_SITE_OTHER): Payer: Medicare Other

## 2020-01-22 DIAGNOSIS — Z0000 Encounter for general adult medical examination without abnormal findings: Secondary | ICD-10-CM | POA: Diagnosis not present

## 2020-01-22 NOTE — Patient Instructions (Signed)
Aaron Cole , Thank you for taking time to come for your Medicare Wellness Visit. I appreciate your ongoing commitment to your health goals. Please review the following plan we discussed and let me know if I can assist you in the future.   Screening recommendations/referrals: Colonoscopy: Up to date, completed 02/24/2019, due 01/2024 Recommended yearly ophthalmology/optometry visit for glaucoma screening and checkup Recommended yearly dental visit for hygiene and checkup  Vaccinations: Influenza vaccine: Up to date, completed 06/04/2019, due 01/2020 Pneumococcal vaccine: Completed series Tdap vaccine: Up to date, completed 08/19/2012, due 08/2022 Shingles vaccine: due, check with insurance regarding coverage   Covid-19: Completed series  Advanced directives: copy in chart  Conditions/risks identified: hypertension, hyperlipidemia  Next appointment: Follow up in one year for your annual wellness visit.   Preventive Care 67 Years and Older, Male Preventive care refers to lifestyle choices and visits with your health care provider that can promote health and wellness. What does preventive care include?  A yearly physical exam. This is also called an annual well check.  Dental exams once or twice a year.  Routine eye exams. Ask your health care provider how often you should have your eyes checked.  Personal lifestyle choices, including:  Daily care of your teeth and gums.  Regular physical activity.  Eating a healthy diet.  Avoiding tobacco and drug use.  Limiting alcohol use.  Practicing safe sex.  Taking low doses of aspirin every day.  Taking vitamin and mineral supplements as recommended by your health care provider. What happens during an annual well check? The services and screenings done by your health care provider during your annual well check will depend on your age, overall health, lifestyle risk factors, and family history of disease. Counseling  Your health care  provider may ask you questions about your:  Alcohol use.  Tobacco use.  Drug use.  Emotional well-being.  Home and relationship well-being.  Sexual activity.  Eating habits.  History of falls.  Memory and ability to understand (cognition).  Work and work Statistician. Screening  You may have the following tests or measurements:  Height, weight, and BMI.  Blood pressure.  Lipid and cholesterol levels. These may be checked every 5 years, or more frequently if you are over 82 years old.  Skin check.  Lung cancer screening. You may have this screening every year starting at age 49 if you have a 30-pack-year history of smoking and currently smoke or have quit within the past 15 years.  Fecal occult blood test (FOBT) of the stool. You may have this test every year starting at age 64.  Flexible sigmoidoscopy or colonoscopy. You may have a sigmoidoscopy every 5 years or a colonoscopy every 10 years starting at age 54.  Prostate cancer screening. Recommendations will vary depending on your family history and other risks.  Hepatitis C blood test.  Hepatitis B blood test.  Sexually transmitted disease (STD) testing.  Diabetes screening. This is done by checking your blood sugar (glucose) after you have not eaten for a while (fasting). You may have this done every 1-3 years.  Abdominal aortic aneurysm (AAA) screening. You may need this if you are a current or former smoker.  Osteoporosis. You may be screened starting at age 39 if you are at high risk. Talk with your health care provider about your test results, treatment options, and if necessary, the need for more tests. Vaccines  Your health care provider may recommend certain vaccines, such as:  Influenza vaccine. This  is recommended every year.  Tetanus, diphtheria, and acellular pertussis (Tdap, Td) vaccine. You may need a Td booster every 10 years.  Zoster vaccine. You may need this after age 70.  Pneumococcal  13-valent conjugate (PCV13) vaccine. One dose is recommended after age 1.  Pneumococcal polysaccharide (PPSV23) vaccine. One dose is recommended after age 37. Talk to your health care provider about which screenings and vaccines you need and how often you need them. This information is not intended to replace advice given to you by your health care provider. Make sure you discuss any questions you have with your health care provider. Document Released: 07/15/2015 Document Revised: 03/07/2016 Document Reviewed: 04/19/2015 Elsevier Interactive Patient Education  2017 [place] Prevention in the Home Falls can cause injuries. They can happen to people of all ages. There are many things you can do to make your home safe and to help prevent falls. What can I do on the outside of my home?  Regularly fix the edges of walkways and driveways and fix any cracks.  Remove anything that might make you trip as you walk through a door, such as a raised step or threshold.  Trim any bushes or trees on the path to your home.  Use bright outdoor lighting.  Clear any walking paths of anything that might make someone trip, such as rocks or tools.  Regularly check to see if handrails are loose or broken. Make sure that both sides of any steps have handrails.  Any raised decks and porches should have guardrails on the edges.  Have any leaves, snow, or ice cleared regularly.  Use sand or salt on walking paths during winter.  Clean up any spills in your garage right away. This includes oil or grease spills. What can I do in the bathroom?  Use night lights.  Install grab bars by the toilet and in the tub and shower. Do not use towel bars as grab bars.  Use non-skid mats or decals in the tub or shower.  If you need to sit down in the shower, use a plastic, non-slip stool.  Keep the floor dry. Clean up any water that spills on the floor as soon as it happens.  Remove soap buildup in the  tub or shower regularly.  Attach bath mats securely with double-sided non-slip rug tape.  Do not have throw rugs and other things on the floor that can make you trip. What can I do in the bedroom?  Use night lights.  Make sure that you have a light by your bed that is easy to reach.  Do not use any sheets or blankets that are too big for your bed. They should not hang down onto the floor.  Have a firm chair that has side arms. You can use this for support while you get dressed.  Do not have throw rugs and other things on the floor that can make you trip. What can I do in the kitchen?  Clean up any spills right away.  Avoid walking on wet floors.  Keep items that you use a lot in easy-to-reach places.  If you need to reach something above you, use a strong step stool that has a grab bar.  Keep electrical cords out of the way.  Do not use floor polish or wax that makes floors slippery. If you must use wax, use non-skid floor wax.  Do not have throw rugs and other things on the floor that can make you  trip. What can I do with my stairs?  Do not leave any items on the stairs.  Make sure that there are handrails on both sides of the stairs and use them. Fix handrails that are broken or loose. Make sure that handrails are as long as the stairways.  Check any carpeting to make sure that it is firmly attached to the stairs. Fix any carpet that is loose or worn.  Avoid having throw rugs at the top or bottom of the stairs. If you do have throw rugs, attach them to the floor with carpet tape.  Make sure that you have a light switch at the top of the stairs and the bottom of the stairs. If you do not have them, ask someone to add them for you. What else can I do to help prevent falls?  Wear shoes that:  Do not have high heels.  Have rubber bottoms.  Are comfortable and fit you well.  Are closed at the toe. Do not wear sandals.  If you use a stepladder:  Make sure that it  is fully opened. Do not climb a closed stepladder.  Make sure that both sides of the stepladder are locked into place.  Ask someone to hold it for you, if possible.  Clearly mark and make sure that you can see:  Any grab bars or handrails.  First and last steps.  Where the edge of each step is.  Use tools that help you move around (mobility aids) if they are needed. These include:  Canes.  Walkers.  Scooters.  Crutches.  Turn on the lights when you go into a dark area. Replace any light bulbs as soon as they burn out.  Set up your furniture so you have a clear path. Avoid moving your furniture around.  If any of your floors are uneven, fix them.  If there are any pets around you, be aware of where they are.  Review your medicines with your doctor. Some medicines can make you feel dizzy. This can increase your chance of falling. Ask your doctor what other things that you can do to help prevent falls. This information is not intended to replace advice given to you by your health care provider. Make sure you discuss any questions you have with your health care provider. Document Released: 04/14/2009 Document Revised: 11/24/2015 Document Reviewed: 07/23/2014 Elsevier Interactive Patient Education  2017 [employer].

## 2020-01-22 NOTE — Progress Notes (Signed)
PCP notes:  Health Maintenance: No gaps noted   Abnormal Screenings: none   Patient concerns: none   Nurse concerns: none   Next PCP appt.: 01/29/2020 @ 3:30 pm

## 2020-01-22 NOTE — Progress Notes (Signed)
Subjective:   Aaron Cole is a 69 y.o. male who presents for Medicare Annual/Subsequent preventive examination.  Review of Systems: N/A      I connected with the patient today by telephone and verified that I am speaking with the correct person using two identifiers. Location patient: home Location nurse: work Persons participating in the virtual visit: patient, Marine scientist.   I discussed the limitations, risks, security and privacy concerns of performing an evaluation and management service by telephone and the availability of in person appointments. I also discussed with the patient that there may be a patient responsible charge related to this service. The patient expressed understanding and verbally consented to this telephonic visit.    Interactive audio and video telecommunications were attempted between this nurse and patient, however failed, due to patient having technical difficulties OR patient did not have access to video capability.  We continued and completed visit with audio only.     Cardiac Risk Factors include: advanced age (>24men, >25 women);male gender;hypertension;dyslipidemia     Objective:    Today's Vitals   There is no height or weight on file to calculate BMI.  Advanced Directives 01/22/2020 02/24/2019 07/06/2015 07/05/2015 06/29/2015 06/23/2015  Does Patient Have a Medical Advance Directive? Yes Yes Yes - Yes Yes  Type of Advance Directive [place];Living will - [place];Living will - [place];Living will Living will;Healthcare Power of Attorney  Does patient want to make changes to medical advance directive? - - - - - No - Patient declined  Copy of [place] in Chart? Yes - validated most recent copy scanned in chart (See row information) - No - copy requested Yes No - copy requested No - copy requested    Current Medications (verified) Outpatient Encounter Medications as of  01/22/2020  Medication Sig  . amLODipine (NORVASC) 5 MG tablet Take 1 tablet (5 mg total) by mouth daily. (Patient taking differently: Take 5 mg by mouth at bedtime. )  . aspirin EC 81 MG tablet Take 81 mg by mouth every evening. Swallow whole.  . cholecalciferol (VITAMIN D) 25 MCG (1000 UNIT) tablet Take 1,000 Units by mouth daily.  . fluticasone (FLONASE) 50 MCG/ACT nasal spray INHALE 2 SPRAYS INTO EACH NOSTRIL EVERY DAY. (Patient taking differently: Place 2 sprays into both nostrils daily as needed (allergies.). )  . Multiple Vitamin (MULTIVITAMIN WITH MINERALS) TABS tablet Take 1 tablet by mouth daily.  . Omega-3 Fatty Acids (FISH OIL) 1000 MG CAPS Take 1 capsule (1,000 mg total) by mouth daily.  Marland Kitchen omeprazole (PRILOSEC) 40 MG capsule Take 1 capsule (40 mg total) by mouth daily.  Marland Kitchen telmisartan (MICARDIS) 80 MG tablet TAKE ONE-HALF (1/2) OF A TABLET BY MOUTH TWICE DAILY. (Patient taking differently: Take 80 mg by mouth at bedtime. )   No facility-administered encounter medications on file as of 01/22/2020.    Allergies (verified) Carvedilol   History: Past Medical History:  Diagnosis Date  . CAD (coronary artery disease) 2013   per CT scan  - brings CD of CT, no report - will need [facility] records of CT scan report  . CKD (chronic kidney disease) stage 3, GFR 30-59 ml/min 2015   iatrogenic (decreased nephron mass) established with Dr. Alfonse Alpers [facility])  . Diverticulosis    by CT scan  . Hepatitis C virus infection without hepatic coma 03/02/2019  . History of hepatitis B virus infection    spontaneously cleared  . HTN (hypertension)   .  Renal cell carcinoma ([place]) 2013   clear cell s/p R nephrectomy  . S/p nephrectomy 2013   [facility]  . Sinus headache    recurrent   Past Surgical History:  Procedure Laterality Date  . BENTALL PROCEDURE N/A 07/05/2015   Procedure: BENTALL PROCEDURE UTILIZING A 28 X 10 MM WOVEN DOUBLE VELOUR VASCULAR GRAFT AND A 28MM GELWEAVE WOVEN VASCULAR GRAFT. 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 COLONOSCOPY  11/2008   [facility] records in chart [facility])  . COLONOSCOPY WITH PROPOFOL N/A 02/24/2019   TA, HP, rpt 5 yrs ([place])  . CORONARY ANGIOPLASTY    . NASAL SINUS SURGERY  1978   sinus [facility]  . NEPHRECTOMY Right 12/2011   [facility], R for RCC  . rectal fistula repair  1980s  . TEE WITHOUT CARDIOVERSION N/A 07/05/2015   Procedure: TRANSESOPHAGEAL ECHOCARDIOGRAM (TEE);  Surgeon: [name], MD;  Location: [place];  Service: Open Heart Surgery;  Laterality: N/A;   Family History  Problem Relation Age of Onset  . Cancer Paternal Grandmother        throat  . Heart disease Mother   . Hypertension Mother   . Heart disease Father   . Diabetes Father   . Colon polyps Father   . Crohn's disease Father   . Coronary artery disease Maternal Uncle        MI  . Cancer Paternal Aunt        breast   . Cancer Paternal Aunt        brain  . Colon polyps Brother   . CAD Maternal Grandfather   . Stroke Neg Hx    Social History   Socioeconomic History  . Marital status: Single    Spouse name: Not on file  . Number of children: Not on file  . Years of education: Not on file  . Highest education level: Not on file  Occupational History  . Not on file  Tobacco Use  . Smoking status: Never Smoker  . Smokeless tobacco: Never Used  Vaping Use  . Vaping Use: Never used  Substance and Sexual Activity  . Alcohol use: Not Currently  . Drug use: Never  . Sexual activity: Yes  Other Topics Concern  . Not on file  Social History Narrative   Caffeine: occasional   Lives alone, 1 cat   Occupation: farming business   Edu: BS   Activity: bikes 3-4x/wk about 1 hour   Diet: good water, fruits/vegetables daily, steak 1x/wk, fish 2x/wk   Social Determinants of Health   Financial Resource Strain: Low Risk   . Difficulty of  Paying Living Expenses: Not hard at all  Food Insecurity: No Food Insecurity  . Worried About Charity fundraiser in the Last Year: Never true  . Ran Out of Food in the Last Year: Never true  Transportation Needs: No Transportation Needs  . Lack of Transportation (Medical): No  . Lack of Transportation (Non-Medical): No  Physical Activity: Insufficiently Active  . Days of Exercise per Week: 4 days  . Minutes of Exercise per Session: 20 min  Stress: No Stress Concern Present  . Feeling of Stress : Not at all  Social Connections:   . Frequency of Communication with Friends and Family:   . Frequency of Social Gatherings with Friends and Family:   .  Attends Religious Services:   . Active Member of Clubs or Organizations:   . Attends Archivist Meetings:   Marland Kitchen Marital Status:     Tobacco Counseling Counseling given: Not Answered   Clinical Intake:  Pre-visit preparation completed: Yes  Pain : No/denies pain     Nutritional Risks: None Diabetes: No  How often do you need to have someone help you when you read instructions, pamphlets, or other written materials from your doctor or pharmacy?: 1 - Never What is the last grade level you completed in school?: college  Diabetic: No Nutrition Risk Assessment:  Has the patient had any N/V/D within the last 2 months?  No  Does the patient have any non-healing wounds?  No  Has the patient had any unintentional weight loss or weight gain?  No   Diabetes:  Is the patient diabetic?  No  If diabetic, was a CBG obtained today?  No  Did the patient bring in their glucometer from home?  No  How often do you monitor your CBG's? N/A.   Financial Strains and Diabetes Management:  Are you having any financial strains with the device, your supplies or your medication? N/A.  Does the patient want to be seen by Chronic Care Management for management of their diabetes?  N/A Would the patient like to be referred to a Nutritionist or  for Diabetic Management?  N/A    Interpreter Needed?: No  Information entered by :: CJohnson, LPN   Activities of Daily Living In your present state of health, do you have any difficulty performing the following activities: 01/22/2020  Hearing? N  Vision? N  Difficulty concentrating or making decisions? N  Walking or climbing stairs? N  Dressing or bathing? N  Doing errands, shopping? N  Preparing Food and eating ? N  Using the Toilet? N  In the past six months, have you accidently leaked urine? N  Do you have problems with loss of bowel control? N  Managing your Medications? N  Managing your Finances? N  Housekeeping or managing your Housekeeping? N  Some recent data might be hidden    Patient Care Team: [name], MD as PCP - General (Family Medicine) [name], MD as PCP - Cardiology (Cardiology)  Indicate any recent Medical Services you may have received from other than [facility] providers in the past year (date may be approximate).     Assessment:   This is a routine wellness examination for [place].  Hearing/Vision screen  Hearing Screening   125Hz  250Hz  500Hz  1000Hz  2000Hz  3000Hz  4000Hz  6000Hz  8000Hz   Right ear:           Left ear:           Vision Screening Comments: Patient gets annual eye exams   Dietary issues and exercise activities discussed: Current Exercise Habits: Home exercise routine, Type of exercise: walking, Time (Minutes): 15, Frequency (Times/Week): 4, Weekly Exercise (Minutes/Week): 60, Intensity: Moderate, Exercise limited by: None identified  Goals    . Patient Stated     01/22/2020,  I will continue to walk 4 days a week for 15 minutes.      Depression Screen PHQ 2/9 Scores 01/22/2020 01/08/2019 12/30/2017 12/27/2016  PHQ - 2 Score 0 0 0 0  PHQ- 9 Score 0 - - -    Fall Risk Fall Risk  01/22/2020 01/08/2019 12/30/2017 12/27/2016  Falls in the past year? 0 0 No No  Number falls in past yr: 0 - - -  Injury with Fall? 0 - - -  Risk for  fall due to : Medication side effect - - -  Follow up Falls evaluation completed;Falls prevention discussed - - -    Any stairs in or around the home? Yes  If so, are there any without handrails? No  Home free of loose throw rugs in walkways, pet beds, electrical cords, etc? Yes  Adequate lighting in your home to reduce risk of falls? Yes   ASSISTIVE DEVICES UTILIZED TO PREVENT FALLS:  Life alert? No  Use of a cane, walker or w/c? No  Grab bars in the bathroom? No  Shower chair or bench in shower? No  Elevated toilet seat or a handicapped toilet? No   TIMED UP AND GO:  Was the test performed? N/A, telephonic visit .   Cognitive Function: MMSE - Mini Mental State Exam 01/22/2020  Orientation to time 5  Orientation to Place 5  Registration 3  Attention/ Calculation 5  Recall 3  Language- repeat 1       Mini Cog  Mini-Cog screen was completed. Maximum score is 22. A value of 0 denotes this part of the MMSE was not completed or the patient failed this part of the Mini-Cog screening.  Immunizations Immunization History  Administered Date(s) Administered  . Influenza, High Dose Seasonal PF 06/04/2019  . Influenza,inj,Quad PF,6+ Mos 05/05/2015, 06/19/2016  . PFIZER SARS-COV-2 Vaccination 08/31/2019, 09/21/2019  . Pneumococcal Conjugate-13 12/30/2017  . Pneumococcal Polysaccharide-23 01/08/2019  . Tdap 08/19/2012    TDAP status: Up to date Flu Vaccine status: Up to date Pneumococcal vaccine status: Up to date Covid-19 vaccine status: Completed vaccines  Qualifies for Shingles Vaccine? Yes   Zostavax completed No   Shingrix Completed?: No.    Education has been provided regarding the importance of this vaccine. Patient has been advised to call insurance company to determine out of pocket expense if they have not yet received this vaccine. Advised may also receive vaccine at local pharmacy or Health Dept. Verbalized acceptance and understanding.  Screening Tests Health  Maintenance  Topic Date Due  . DTAP VACCINES (1) 10/24/1950  . INFLUENZA VACCINE  01/31/2020  . DTaP/Tdap/Td (2 - Td or Tdap) 08/19/2022  . TETANUS/TDAP  08/19/2022  . COLONOSCOPY  02/24/2024  . COVID-19 Vaccine  Completed  . Hepatitis C Screening  Completed  . PNA vac Low Risk Adult  Completed    Health Maintenance  Health Maintenance Due  Topic Date Due  . DTAP VACCINES (1) 10/24/1950    Colorectal cancer screening: Completed 02/24/2019. Repeat every 5 years  Lung Cancer Screening: (Low Dose CT Chest recommended if Age 64-80 years, 30 pack-year currently smoking OR have quit w/in 15 years.) does not qualify.     Additional Screening:  Hepatitis C Screening: does qualify; Completed 09/09/2019  Vision Screening: Recommended annual ophthalmology exams for early detection of glaucoma and other disorders of the eye. Is the patient up to date with their annual eye exam?  Yes  Who is the provider or what is the name of the office in which the patient attends annual eye exams? Dr. Horald Pollen If pt is not established with a provider, would they like to be referred to a provider to establish care? No .   Dental Screening: Recommended annual dental exams for proper oral hygiene  Community Resource Referral / Chronic Care Management: CRR required this visit?  No   CCM required this visit?  No      Plan:  I have personally reviewed and noted the following in the patient's chart:   . Medical and social history . Use of alcohol, tobacco or illicit drugs  . Current medications and supplements . Functional ability and status . Nutritional status . Physical activity . Advanced directives . List of other physicians . Hospitalizations, surgeries, and ER visits in previous 12 months . Vitals . Screenings to include cognitive, depression, and falls . Referrals and appointments  In addition, I have reviewed and discussed with patient certain preventive protocols, quality  metrics, and best practice recommendations. A written personalized care plan for preventive services as well as general preventive health recommendations were provided to patient.   Due to this being a telephonic visit, the after visit summary with patients personalized plan was offered to patient via mail or my-chart. Patient preferred to pick up at office at next visit.   Andrez Grime, LPN   [id number]

## 2020-01-26 ENCOUNTER — Other Ambulatory Visit: Payer: Self-pay | Admitting: Urology

## 2020-01-26 ENCOUNTER — Other Ambulatory Visit: Payer: Self-pay | Admitting: *Deleted

## 2020-01-26 DIAGNOSIS — N432 Other hydrocele: Secondary | ICD-10-CM

## 2020-01-26 DIAGNOSIS — Z85528 Personal history of other malignant neoplasm of kidney: Secondary | ICD-10-CM

## 2020-01-27 ENCOUNTER — Encounter
Admission: RE | Admit: 2020-01-27 | Discharge: 2020-01-27 | Disposition: A | Discharge status: Home / Self Care | Payer: Medicare Other | Source: Ambulatory Visit | Attending: Urology | Admitting: Urology

## 2020-01-27 ENCOUNTER — Telehealth: Payer: Self-pay

## 2020-01-27 ENCOUNTER — Other Ambulatory Visit: Payer: Medicare Other

## 2020-01-27 ENCOUNTER — Other Ambulatory Visit: Payer: Self-pay

## 2020-01-27 DIAGNOSIS — R945 Abnormal results of liver function studies: Secondary | ICD-10-CM

## 2020-01-27 DIAGNOSIS — R7989 Other specified abnormal findings of blood chemistry: Secondary | ICD-10-CM

## 2020-01-27 DIAGNOSIS — B1920 Unspecified viral hepatitis C without hepatic coma: Secondary | ICD-10-CM

## 2020-01-27 NOTE — Telephone Encounter (Signed)
Patient verbalized understanding. He states he will have the lab work done on 02/19/2020. Released lab work

## 2020-01-27 NOTE — Telephone Encounter (Signed)
-----   Message from Rushie Chestnut, [place] sent at 09/09/2019  2:59 PM EST ----- Regarding: Pt is due for repeat labs Pt is due for repeat LFT and Hep C viral load lab test this month. (release orders)

## 2020-01-27 NOTE — Patient Instructions (Addendum)
Your procedure is scheduled on: Friday 02/05/20.  Report to DAY SURGERY DEPARTMENT LOCATED ON 2ND FLOOR MEDICAL MALL ENTRANCE. To find out your arrival time please call [phone] between 1PM - 3PM on Thursday 02/04/20.   Remember: Instructions that are not followed completely may result in serious medical risk, up to and including death, or upon the discretion of your surgeon and anesthesiologist your surgery may need to be rescheduled.     __X__ 1. Do not eat food after midnight the night before your procedure.                 No gum chewing or hard candies. You may drink clear liquids up to 2 hours                 before you are scheduled to arrive for your surgery- DO NOT drink clear                 liquids within 2 hours of the start of your surgery.                 Clear Liquids include:  water, apple juice without pulp, clear carbohydrate                 drink such as Clearfast or Gatorade, Black Coffee or Tea (Do not add                 milk or creamer to coffee or tea).   __X__2.  On the morning of surgery brush your teeth with toothpaste and water, you may rinse your mouth with mouthwash if you wish.  Do not swallow any toothpaste or mouthwash.     __X__ 3.  No Alcohol for 24 hours before or after surgery.   __X__ 4.  Do Not Smoke or use e-cigarettes For 24 Hours Prior to Your Surgery.                 Do not use any chewable tobacco products for at least 6 hours prior to                 surgery.   __X__5.  Notify your doctor if there is any change in your medical condition      (cold, fever, infections).       Do NOT wear jewelry, make-up, hairpins, clips or nail polish. Do NOT wear lotions, powders, or perfumes.  Do NOT shave 48 hours prior to surgery. Men may shave face and neck. Do NOT bring valuables to the hospital.      [facility] is not responsible for any belongings or valuables.   Contacts, dentures/partials or body piercings may not be worn into  surgery. Bring a case for your contacts, glasses or hearing aids, a denture cup will be supplied. Leave your suitcase in the car. After surgery it may be brought to your room.   For patients admitted to the hospital, discharge time is determined by your treatment team.    Patients discharged the day of surgery will not be allowed to drive home.     __X__ Take these medicines the morning of surgery with A SIP OF WATER:     1. omeprazole (PRILOSEC) 40 MG capsule    __X__ Stop Blood Thinners: Aspirin. Your last dose is today 01/27/20.  __X__ Stop Anti-inflammatories 7 days before surgery such as Advil, Ibuprofen, Motrin, BC or Goodies Powder, Naprosyn, Naproxen, Aleve, Aspirin, Meloxicam. May take Tylenol if needed for  pain or discomfort.   __X__Do not start taking any new herbal supplements or vitamins prior to your procedure.  __X__ Stop the following herbal supplements or vitamins:   Omega-3 Fatty Acids (FISH OIL) 1000 MG CAPS your last dose is today 01/27/20.   Wear comfortable clothing (specific to your surgery type) to the hospital.  Plan for stool softeners for home use; pain medications have a tendency to cause constipation. You can also help prevent constipation by eating foods high in fiber such as fruits and vegetables and drinking plenty of fluids as your diet allows.  After surgery, you can prevent lung complications by doing breathing exercises.Take deep breaths and cough every 1-2 hours. Your doctor may order a device called an Incentive Spirometer to help you take deep breaths.  Please call the [place] Department at [phone] if you have any questions about these instructions

## 2020-01-29 ENCOUNTER — Encounter: Payer: Self-pay | Admitting: Family Medicine

## 2020-01-29 ENCOUNTER — Other Ambulatory Visit: Payer: Self-pay

## 2020-01-29 ENCOUNTER — Telehealth: Payer: Self-pay | Admitting: Family Medicine

## 2020-01-29 ENCOUNTER — Ambulatory Visit (INDEPENDENT_AMBULATORY_CARE_PROVIDER_SITE_OTHER): Payer: Medicare Other | Admitting: Family Medicine

## 2020-01-29 VITALS — BP 114/88 | HR 81 | Temp 97.0°F | Ht 70.8 in | Wt 219.4 lb

## 2020-01-29 DIAGNOSIS — B1920 Unspecified viral hepatitis C without hepatic coma: Secondary | ICD-10-CM

## 2020-01-29 DIAGNOSIS — Z9889 Other specified postprocedural states: Secondary | ICD-10-CM | POA: Diagnosis not present

## 2020-01-29 DIAGNOSIS — R1310 Dysphagia, unspecified: Secondary | ICD-10-CM

## 2020-01-29 DIAGNOSIS — Z85528 Personal history of other malignant neoplasm of kidney: Secondary | ICD-10-CM

## 2020-01-29 DIAGNOSIS — N433 Hydrocele, unspecified: Secondary | ICD-10-CM

## 2020-01-29 DIAGNOSIS — N1831 Chronic kidney disease, stage 3a: Secondary | ICD-10-CM

## 2020-01-29 DIAGNOSIS — Z905 Acquired absence of kidney: Secondary | ICD-10-CM | POA: Diagnosis not present

## 2020-01-29 DIAGNOSIS — Z8679 Personal history of other diseases of the circulatory system: Secondary | ICD-10-CM | POA: Diagnosis not present

## 2020-01-29 DIAGNOSIS — Z7189 Other specified counseling: Secondary | ICD-10-CM | POA: Diagnosis not present

## 2020-01-29 DIAGNOSIS — I10 Essential (primary) hypertension: Secondary | ICD-10-CM

## 2020-01-29 DIAGNOSIS — E782 Mixed hyperlipidemia: Secondary | ICD-10-CM

## 2020-01-29 DIAGNOSIS — Z952 Presence of prosthetic heart valve: Secondary | ICD-10-CM | POA: Diagnosis not present

## 2020-01-29 NOTE — Assessment & Plan Note (Signed)
Completed treatment. Upcoming f/u planned with hepatology.

## 2020-01-29 NOTE — Telephone Encounter (Signed)
Will route to scheduler [place] to schedule

## 2020-01-29 NOTE — Assessment & Plan Note (Signed)
Appreciate [facility] uro care.

## 2020-01-29 NOTE — Assessment & Plan Note (Addendum)
Advanced directive: reviewed through [employer]. HCPOA is aunt June Reece then William Reece. Grants discretion to HCPOA for life support decision if terminal or incurable condition.  

## 2020-01-29 NOTE — Telephone Encounter (Signed)
plz call - I would like him to schedule 6 mo lab visit only (and not wait 12 months for next set of labs). I ordered labwork.

## 2020-01-29 NOTE — Progress Notes (Signed)
This visit was conducted in person.  BP (!) 114/88 (BP Location: Right Arm, Cuff Size: Large)   Pulse 81   Temp (!) 97 F (36.1 C) (Temporal)   Ht 5' 10.8" (1.798 m)   Wt (!) 219 lb 7 oz (99.5 kg)   SpO2 97%   BMI 30.78 kg/m   BP Readings from Last 3 Encounters:  01/29/20 (!) 114/88  11/02/19 (!) 144/94  08/31/19 (!) 146/93    CC: AMW f/u visit  Subjective:    Patient ID: Aaron Cole, male    DOB: 27-Aug-1950, 69 y.o.   MRN: [id number]  HPI: Aaron Cole is a 69 y.o. male presenting on 01/29/2020 for Medicare Wellness (part 2)   Saw health advisor last week for medicare wellness visit. Note reviewed.    No exam data present    Clinical Support from 01/22/2020 in [place] [facility] Total Score 0      Fall Risk  01/22/2020 01/08/2019 12/30/2017 12/27/2016  Falls in the past year? 0 0 No No  Number falls in past yr: 0 - - -  Injury with Fall? 0 - - -  Risk for fall due to : Medication side effect - - -  Follow up Falls evaluation completed;Falls prevention discussed - - -    HTN - currently on amlodipine 5mg  and telmisartan 80mg  daily - taking at night time because when he took in the morning it would knock him out. Home BP reading 110/84. He does stay well hydrated. No hypotensive   Had [place] procedure 07/2015 - ascending aortic aneurysm repair with AVR ([facility]).  S/p R nephrectomy for renal cell cancer (2013). Sees Tawni Levy NP at [facility] nephrology.  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 H/o spontaneously cleared prior Hep B infection.   Stopped PPI - significant improvement when he stopped meat.   Preventative: COLONOSCOPY WITH PROPOFOL 02/24/2019 - TA, HP, rpt 5 yrs ([facility]) Prostate cancer screening -discussed, would like PSA, declines DRE today Lung cancer screening - not eligible AAA screen - not eligible. Fluyearly Tetanus - 08/2012 prevnar - 12/2017, pneumovax  12/2018 COVID vaccine - completed [place] series 08/2019 Shingrix - discussed, pt will look into local pharmacy. Advanced directive: reviewed through [employer]. HCPOA is aunt June Reece then Audley Hose. Grants discretion to [facility] for life support decision if terminal or incurable condition.  Seat belt use discussed.  Sunscreen use discussed.No changing moles on skin. Non smoker Alcohol - stopped 2020 Dentist - Q6 mo  Eye exam - yearly Bowel - no constipation Bladder - no incontinence  Caffeine: occasional  Lives alone, 1 cat  Occupation: owns Production assistant, [employer] Edu: BS  Activity:planning on restarting stationary bicycle Diet: good water, fruits/vegetables daily, fish 2x/wk     Relevant past medical, surgical, family and social history reviewed and updated as indicated. Interim medical history since our last visit reviewed. Allergies and medications reviewed and updated. Outpatient Medications Prior to Visit  Medication Sig Dispense Refill  . amLODipine (NORVASC) 5 MG tablet Take 1 tablet (5 mg total) by mouth daily. (Patient taking differently: Take 5 mg by mouth at bedtime. ) 90 tablet 3  . aspirin EC 81 MG tablet Take 81 mg by mouth every evening. Swallow whole.    . cholecalciferol (VITAMIN D) 25 MCG (1000 UNIT) tablet Take 1,000 Units by mouth daily.    . fluticasone (FLONASE) 50 MCG/ACT nasal spray INHALE 2  SPRAYS INTO EACH NOSTRIL EVERY DAY. (Patient taking differently: Place 2 sprays into both nostrils daily as needed (allergies.). ) 16 g 3  . Multiple Vitamin (MULTIVITAMIN WITH MINERALS) TABS tablet Take 1 tablet by mouth daily.    . Omega-3 Fatty Acids (FISH OIL) 1000 MG CAPS Take 1 capsule (1,000 mg total) by mouth daily.  0  . telmisartan (MICARDIS) 80 MG tablet TAKE ONE-HALF (1/2) OF A TABLET BY MOUTH TWICE DAILY. (Patient taking differently: 80 mg. TAKE ONE TABLET BY MOUTH DAILY.) 90 tablet 3  . omeprazole (PRILOSEC) 40 MG capsule Take 1 capsule (40 mg total) by  mouth daily. 30 capsule 3   No facility-administered medications prior to visit.     Per HPI unless specifically indicated in ROS section below Review of Systems Objective:  BP (!) 114/88 (BP Location: Right Arm, Cuff Size: Large)   Pulse 81   Temp (!) 97 F (36.1 C) (Temporal)   Ht 5' 10.8" (1.798 m)   Wt (!) 219 lb 7 oz (99.5 kg)   SpO2 97%   BMI 30.78 kg/m   Wt Readings from Last 3 Encounters:  01/29/20 (!) 219 lb 7 oz (99.5 kg)  01/27/20 (!) 219 lb (99.3 kg)  11/02/19 220 lb 3.2 oz (99.9 kg)      Physical Exam Vitals and nursing note reviewed.  Constitutional:      General: He is not in acute distress.    Appearance: Normal appearance. He is well-developed. He is not ill-appearing.  HENT:     Head: Normocephalic and atraumatic.     Right Ear: Hearing, tympanic membrane, ear canal and external ear normal.     Left Ear: Hearing, tympanic membrane, ear canal and external ear normal.     Mouth/Throat:     Pharynx: Uvula midline.  Eyes:     General: No scleral icterus.    Extraocular Movements: Extraocular movements intact.     Conjunctiva/sclera: Conjunctivae normal.     Pupils: Pupils are equal, round, and reactive to light.  Neck:     Thyroid: No thyroid mass, thyromegaly or thyroid tenderness.     Vascular: No carotid bruit.  Cardiovascular:     Rate and Rhythm: Normal rate and regular rhythm.     Pulses: Normal pulses.          Radial pulses are 2+ on the right side and 2+ on the left side.     Heart sounds: Murmur (3/6 systolic murmur best at USB) heard.   Pulmonary:     Effort: Pulmonary effort is normal. No respiratory distress.     Breath sounds: Normal breath sounds. No wheezing, rhonchi or rales.  Abdominal:     General: Abdomen is flat. Bowel sounds are normal. There is no distension.     Palpations: Abdomen is soft. There is no mass.     Tenderness: There is no abdominal tenderness. There is no guarding or rebound.     Hernia: No hernia is present.   Musculoskeletal:        General: Normal range of motion.     Cervical back: Normal range of motion and neck supple.     Right lower leg: No edema.     Left lower leg: No edema.  Lymphadenopathy:     Cervical: No cervical adenopathy.  Skin:    General: Skin is warm and dry.     Findings: No rash.  Neurological:     General: No focal deficit present.  Mental Status: He is alert and oriented to person, place, and time.     Comments: CN grossly intact, station and gait intact  Psychiatric:        Mood and Affect: Mood normal.        Behavior: Behavior normal.        Thought Content: Thought content normal.        Judgment: Judgment normal.       Results for orders placed or performed in visit on 01/15/20  PSA, Medicare  Result Value Ref Range   PSA 1.24 0.10 - 4.00 ng/ml  VITAMIN D 25 Hydroxy (Vit-D Deficiency, Fractures)  Result Value Ref Range   VITD 33.00 30.00 - 100.00 ng/mL  CBC with Differential/Platelet  Result Value Ref Range   WBC 6.7 4.0 - 10.5 K/uL   RBC 4.96 4.22 - 5.81 Mil/uL   Hemoglobin 14.8 13.0 - 17.0 g/dL   HCT 43.0 39 - 52 %   MCV 86.8 78.0 - 100.0 fl   MCHC 34.4 30.0 - 36.0 g/dL   RDW 13.6 11.5 - 15.5 %   Platelets 162.0 150 - 400 K/uL   Neutrophils Relative % 48.0 43 - 77 %   Lymphocytes Relative 41.6 12 - 46 %   Monocytes Relative 7.7 3 - 12 %   Eosinophils Relative 2.3 0 - 5 %   Basophils Relative 0.4 0 - 3 %   Neutro Abs 3.2 1.4 - 7.7 K/uL   Lymphs Abs 2.8 0.7 - 4.0 K/uL   Monocytes Absolute 0.5 0 - 1 K/uL   Eosinophils Absolute 0.2 0 - 0 K/uL   Basophils Absolute 0.0 0 - 0 K/uL  Comprehensive metabolic panel  Result Value Ref Range   Sodium 138 135 - 145 mEq/L   Potassium 4.1 3.5 - 5.1 mEq/L   Chloride 106 96 - 112 mEq/L   CO2 25 19 - 32 mEq/L   Glucose, Bld 102 (H) 70 - 99 mg/dL   BUN 30 (H) 6 - 23 mg/dL   Creatinine, Ser 1.34 0.40 - 1.50 mg/dL   Total Bilirubin 0.9 0.2 - 1.2 mg/dL   Alkaline Phosphatase 68 39 - 117 U/L   AST 17 0  - 37 U/L   ALT 11 0 - 53 U/L   Total Protein 7.2 6.0 - 8.3 g/dL   Albumin 4.3 3.5 - 5.2 g/dL   GFR 53.11 (L) >60.00 mL/min   Calcium 9.5 8.4 - 10.5 mg/dL  Lipid panel  Result Value Ref Range   Cholesterol 184 0 - 200 mg/dL   Triglycerides 170.0 (H) 0 - 149 mg/dL   HDL 26.80 (L) >39.00 mg/dL   VLDL 34.0 0.0 - 40.0 mg/dL   LDL Cholesterol 123 (H) 0 - 99 mg/dL   Total CHOL/HDL Ratio 7    NonHDL 157.49    Assessment & Plan:  This visit occurred during the SARS-CoV-2 public health emergency.  Safety protocols were in place, including screening questions prior to the visit, additional usage of staff PPE, and extensive cleaning of exam room while observing appropriate contact time as indicated for disinfecting solutions.   Problem List Items Addressed This Visit    S/p nephrectomy   S/P ascending aortic aneurysm repair   S/P aortic valve replacement   Right hydrocele    Upcoming hydrocelectomy 01/2020      HTN (hypertension)    Chronic, initially low when arrived but rpt BP check was adequate (with elevated diastolic to 88). No med changes besides  recommend splitting meds (am and pm). Encouraged good hydration status.       HLD (hyperlipidemia)    Chronic, not on statin. Will consider adding given elevated ASCVD risk. The 10-year ASCVD risk score Mikey Bussing DC Brooke Bonito., et al., 2013) is: 18.3%   Values used to calculate the score:     Age: 45 years     Sex: Male     Is Non-Hispanic African American: No     Diabetic: No     Tobacco smoker: No     Systolic Blood Pressure: 700 mmHg     Is BP treated: Yes     HDL Cholesterol: 26.8 mg/dL     Total Cholesterol: 184 mg/dL       History of renal cell carcinoma    Appreciate [facility] uro care.       Hepatitis C virus infection without hepatic coma    Completed treatment. Upcoming f/u planned with hepatology.       Dysphagia    This actually resolved since he's backed off red meats. Now off PPI.       CKD (chronic kidney disease) stage 3,  GFR 30-59 ml/min    Deterioration noted. Reviewed importance of good hydration status - rec increase water especially during summer months. Want to avoid hypotension as well. rec labwork in 6 months.       Relevant Orders   Renal function panel   Microalbumin / creatinine urine ratio   Advanced care planning/counseling discussion - Primary    Advanced directive: reviewed through [employer]. HCPOA is aunt June Reece then Audley Hose. Grants discretion to [facility] for life support decision if terminal or incurable condition.           No orders of the defined types were placed in this encounter.  Orders Placed This Encounter  Procedures  . Renal function panel    Standing Status:   Future    Standing Expiration Date:   01/28/2021  . Microalbumin / creatinine urine ratio    Standing Status:   Future    Standing Expiration Date:   01/28/2021    Patient instructions: If interested, check with pharmacy about new 2 shot shingles series (shingrix).  Split bp medicines - one in am and one at night. Continue monitoring blood pressures at home, let us or cardiology know if too low (<100/60) or too high (>130/80).  Return as needed or in 1 year for next physical.   Follow up plan: Return in about 1 year (around 01/28/2021) for medicare wellness visit, follow up visit.  [name], MD

## 2020-01-29 NOTE — Assessment & Plan Note (Signed)
Chronic, not on statin. Will consider adding given elevated ASCVD risk. The 10-year ASCVD risk score Aaron Cole Aaron Cole., et al., 2013) is: 18.3%   Values used to calculate the score:     Age: 69 years     Sex: Male     Is Non-Hispanic African American: No     Diabetic: No     Tobacco smoker: No     Systolic Blood Pressure: 940 mmHg     Is BP treated: Yes     HDL Cholesterol: 26.8 mg/dL     Total Cholesterol: 184 mg/dL

## 2020-01-29 NOTE — Assessment & Plan Note (Signed)
Upcoming hydrocelectomy 01/2020

## 2020-01-29 NOTE — Assessment & Plan Note (Signed)
Chronic, initially low when arrived but rpt BP check was adequate (with elevated diastolic to 88). No med changes besides recommend splitting meds (am and pm). Encouraged good hydration status.

## 2020-01-29 NOTE — Assessment & Plan Note (Signed)
This actually resolved since he's backed off red meats. Now off PPI.

## 2020-01-29 NOTE — Patient Instructions (Addendum)
If interested, check with pharmacy about new 2 shot shingles series (shingrix).  Split bp medicines - one in am and one at night. Continue monitoring blood pressures at home, let us or cardiology know if too low (<100/60) or too high (>130/80).  Return as needed or in 1 year for next physical.   Health Maintenance After Age 67 After age 50, you are at a higher risk for certain long-term diseases and infections as well as injuries from falls. Falls are a major cause of broken bones and head injuries in people who are older than age 60. Getting regular preventive care can help to keep you healthy and well. Preventive care includes getting regular testing and making lifestyle changes as recommended by your health care provider. Talk with your health care provider about:  Which screenings and tests you should have. A screening is a test that checks for a disease when you have no symptoms.  A diet and exercise plan that is right for you. What should I know about screenings and tests to prevent falls? Screening and testing are the best ways to find a health problem early. Early diagnosis and treatment give you the best chance of managing medical conditions that are common after age 56. Certain conditions and lifestyle choices may make you more likely to have a fall. Your health care provider may recommend:  Regular vision checks. Poor vision and conditions such as cataracts can make you more likely to have a fall. If you wear glasses, make sure to get your prescription updated if your vision changes.  Medicine review. Work with your health care provider to regularly review all of the medicines you are taking, including over-the-counter medicines. Ask your health care provider about any side effects that may make you more likely to have a fall. Tell your health care provider if any medicines that you take make you feel dizzy or sleepy.  Osteoporosis screening. Osteoporosis is a condition that causes the  bones to get weaker. This can make the bones weak and cause them to break more easily.  Blood pressure screening. Blood pressure changes and medicines to control blood pressure can make you feel dizzy.  Strength and balance checks. Your health care provider may recommend certain tests to check your strength and balance while standing, walking, or changing positions.  Foot health exam. Foot pain and numbness, as well as not wearing proper footwear, can make you more likely to have a fall.  Depression screening. You may be more likely to have a fall if you have a fear of falling, feel emotionally low, or feel unable to do activities that you used to do.  Alcohol use screening. Using too much alcohol can affect your balance and may make you more likely to have a fall. What actions can I take to lower my risk of falls? General instructions  Talk with your health care provider about your risks for falling. Tell your health care provider if: ? You fall. Be sure to tell your health care provider about all falls, even ones that seem minor. ? You feel dizzy, sleepy, or off-balance.  Take over-the-counter and prescription medicines only as told by your health care provider. These include any supplements.  Eat a healthy diet and maintain a healthy weight. A healthy diet includes low-fat dairy products, low-fat (lean) meats, and fiber from whole grains, beans, and lots of fruits and vegetables. Home safety  Remove any tripping hazards, such as rugs, cords, and clutter.  Install safety  equipment such as grab bars in bathrooms and safety rails on stairs.  Keep rooms and walkways well-lit. Activity   Follow a regular exercise program to stay fit. This will help you maintain your balance. Ask your health care provider what types of exercise are appropriate for you.  If you need a cane or walker, use it as recommended by your health care provider.  Wear supportive shoes that have nonskid  soles. Lifestyle  Do not drink alcohol if your health care provider tells you not to drink.  If you drink alcohol, limit how much you have: ? 0-1 drink a day for women. ? 0-2 drinks a day for men.  Be aware of how much alcohol is in your drink. In the [place] one drink equals one typical bottle of beer (12 oz), one-half glass of wine (5 oz), or one shot of hard liquor (1 oz).  Do not use any products that contain nicotine or tobacco, such as cigarettes and e-cigarettes. If you need help quitting, ask your health care provider. Summary  Having a healthy lifestyle and getting preventive care can help to protect your health and wellness after age 15.  Screening and testing are the best way to find a health problem early and help you avoid having a fall. Early diagnosis and treatment give you the best chance for managing medical conditions that are more common for people who are older than age 24.  Falls are a major cause of broken bones and head injuries in people who are older than age 81. Take precautions to prevent a fall at home.  Work with your health care provider to learn what changes you can make to improve your health and wellness and to prevent falls. This information is not intended to replace advice given to you by your health care provider. Make sure you discuss any questions you have with your health care provider. Document Revised: 10/09/2018 Document Reviewed: 05/01/2017 Elsevier Patient Education  2020 [employer].

## 2020-01-29 NOTE — Assessment & Plan Note (Addendum)
Deterioration noted. Reviewed importance of good hydration status - rec increase water especially during summer months. Want to avoid hypotension as well. rec labwork in 6 months.

## 2020-02-01 NOTE — Telephone Encounter (Signed)
I called patient and got him scheduled for 6 mo f/u labs.

## 2020-02-03 ENCOUNTER — Other Ambulatory Visit: Payer: Self-pay

## 2020-02-03 ENCOUNTER — Other Ambulatory Visit
Admission: RE | Admit: 2020-02-03 | Discharge: 2020-02-03 | Disposition: A | Discharge status: Home / Self Care | Payer: Medicare Other | Source: Ambulatory Visit | Attending: Urology | Admitting: Urology

## 2020-02-03 DIAGNOSIS — Z01812 Encounter for preprocedural laboratory examination: Secondary | ICD-10-CM | POA: Insufficient documentation

## 2020-02-03 DIAGNOSIS — Z20822 Contact with and (suspected) exposure to covid-19: Secondary | ICD-10-CM | POA: Diagnosis not present

## 2020-02-03 LAB — SARS CORONAVIRUS 2 (TAT 6-24 HRS): SARS Coronavirus 2: NEGATIVE

## 2020-02-05 ENCOUNTER — Other Ambulatory Visit: Payer: Self-pay

## 2020-02-05 ENCOUNTER — Encounter: Payer: Self-pay | Admitting: Urology

## 2020-02-05 ENCOUNTER — Ambulatory Visit: Payer: Medicare Other | Admitting: Certified Registered Nurse Anesthetist

## 2020-02-05 ENCOUNTER — Encounter
Admission: RE | Disposition: A | Discharge status: Home / Self Care | Payer: Self-pay | Source: Home / Self Care | Attending: Urology

## 2020-02-05 ENCOUNTER — Ambulatory Visit
Admission: RE | Admit: 2020-02-05 | Discharge: 2020-02-05 | Disposition: A | Discharge status: Home / Self Care | Payer: Medicare Other | Attending: Urology | Admitting: Urology

## 2020-02-05 DIAGNOSIS — N183 Chronic kidney disease, stage 3 unspecified: Secondary | ICD-10-CM | POA: Insufficient documentation

## 2020-02-05 DIAGNOSIS — Z9861 Coronary angioplasty status: Secondary | ICD-10-CM | POA: Diagnosis not present

## 2020-02-05 DIAGNOSIS — Z952 Presence of prosthetic heart valve: Secondary | ICD-10-CM | POA: Diagnosis not present

## 2020-02-05 DIAGNOSIS — I2510 Atherosclerotic heart disease of native coronary artery without angina pectoris: Secondary | ICD-10-CM | POA: Insufficient documentation

## 2020-02-05 DIAGNOSIS — I129 Hypertensive chronic kidney disease with stage 1 through stage 4 chronic kidney disease, or unspecified chronic kidney disease: Secondary | ICD-10-CM | POA: Diagnosis not present

## 2020-02-05 DIAGNOSIS — E785 Hyperlipidemia, unspecified: Secondary | ICD-10-CM | POA: Diagnosis not present

## 2020-02-05 DIAGNOSIS — Z85528 Personal history of other malignant neoplasm of kidney: Secondary | ICD-10-CM | POA: Diagnosis not present

## 2020-02-05 DIAGNOSIS — N430 Encysted hydrocele: Secondary | ICD-10-CM | POA: Diagnosis not present

## 2020-02-05 DIAGNOSIS — Z905 Acquired absence of kidney: Secondary | ICD-10-CM | POA: Diagnosis not present

## 2020-02-05 DIAGNOSIS — N433 Hydrocele, unspecified: Secondary | ICD-10-CM | POA: Insufficient documentation

## 2020-02-05 DIAGNOSIS — N432 Other hydrocele: Secondary | ICD-10-CM

## 2020-02-05 HISTORY — PX: HYDROCELE EXCISION: SHX482

## 2020-02-05 SURGERY — HYDROCELECTOMY
Anesthesia: General | Laterality: Right

## 2020-02-05 MED ORDER — FENTANYL CITRATE (PF) 100 MCG/2ML IJ SOLN
INTRAMUSCULAR | Status: AC
  Administered 2020-02-05: 25 ug via INTRAVENOUS
  Filled 2020-02-05: qty 2

## 2020-02-05 MED ORDER — SUGAMMADEX SODIUM 500 MG/5ML IV SOLN
INTRAVENOUS | Status: DC | PRN
  Administered 2020-02-05: 200 mg via INTRAVENOUS

## 2020-02-05 MED ORDER — LIDOCAINE HCL (PF) 1 % IJ SOLN
INTRAMUSCULAR | Status: AC
  Filled 2020-02-05: qty 30

## 2020-02-05 MED ORDER — ONDANSETRON HCL 4 MG/2ML IJ SOLN
INTRAMUSCULAR | Status: AC
  Filled 2020-02-05: qty 2

## 2020-02-05 MED ORDER — CHLORHEXIDINE GLUCONATE 0.12 % MT SOLN: 15.0000 mL | Freq: Once | OROMUCOSAL | Status: AC

## 2020-02-05 MED ORDER — BACITRACIN ZINC 500 UNIT/GM EX OINT
TOPICAL_OINTMENT | CUTANEOUS | Status: AC
  Filled 2020-02-05: qty 28.35

## 2020-02-05 MED ORDER — CHLORHEXIDINE GLUCONATE 0.12 % MT SOLN
OROMUCOSAL | Status: AC
  Administered 2020-02-05: 15 mL via OROMUCOSAL
  Filled 2020-02-05: qty 15

## 2020-02-05 MED ORDER — PHENYLEPHRINE HCL (PRESSORS) 10 MG/ML IV SOLN
INTRAVENOUS | Status: DC | PRN
  Administered 2020-02-05: 200 ug via INTRAVENOUS
  Administered 2020-02-05: 100 ug via INTRAVENOUS
  Administered 2020-02-05: 200 ug via INTRAVENOUS
  Administered 2020-02-05: 100 ug via INTRAVENOUS

## 2020-02-05 MED ORDER — ONDANSETRON HCL 4 MG/2ML IJ SOLN
INTRAMUSCULAR | Status: DC | PRN
  Administered 2020-02-05: 4 mg via INTRAVENOUS

## 2020-02-05 MED ORDER — PROPOFOL 10 MG/ML IV BOLUS
INTRAVENOUS | Status: AC
  Filled 2020-02-05: qty 20

## 2020-02-05 MED ORDER — ORAL CARE MOUTH RINSE: 15.0000 mL | Freq: Once | OROMUCOSAL | Status: AC

## 2020-02-05 MED ORDER — DEXAMETHASONE SODIUM PHOSPHATE 10 MG/ML IJ SOLN
INTRAMUSCULAR | Status: DC | PRN
  Administered 2020-02-05: 5 mg via INTRAVENOUS

## 2020-02-05 MED ORDER — ONDANSETRON HCL 4 MG/2ML IJ SOLN
4.0000 mg | Freq: Once | INTRAMUSCULAR | Status: AC | PRN
  Administered 2020-02-05: 4 mg via INTRAVENOUS

## 2020-02-05 MED ORDER — SODIUM CHLORIDE 0.9 % IV SOLN
INTRAVENOUS | Status: DC | PRN
  Administered 2020-02-05: 75 ug/min via INTRAVENOUS

## 2020-02-05 MED ORDER — FENTANYL CITRATE (PF) 100 MCG/2ML IJ SOLN
INTRAMUSCULAR | Status: AC
  Filled 2020-02-05: qty 2

## 2020-02-05 MED ORDER — HYDROCODONE-ACETAMINOPHEN 5-325 MG PO TABS: 1.0000 | ORAL_TABLET | ORAL | 0 refills | Status: AC | PRN

## 2020-02-05 MED ORDER — LACTATED RINGERS IV SOLN: INTRAVENOUS | Status: DC

## 2020-02-05 MED ORDER — CEFAZOLIN SODIUM-DEXTROSE 2-4 GM/100ML-% IV SOLN
2.0000 g | INTRAVENOUS | Status: AC
  Administered 2020-02-05: 2 g via INTRAVENOUS

## 2020-02-05 MED ORDER — CEFAZOLIN SODIUM-DEXTROSE 2-4 GM/100ML-% IV SOLN
INTRAVENOUS | Status: AC
  Filled 2020-02-05: qty 100

## 2020-02-05 MED ORDER — MIDAZOLAM HCL 2 MG/2ML IJ SOLN
INTRAMUSCULAR | Status: AC
  Filled 2020-02-05: qty 2

## 2020-02-05 MED ORDER — FENTANYL CITRATE (PF) 100 MCG/2ML IJ SOLN
25.0000 ug | INTRAMUSCULAR | Status: AC | PRN
  Administered 2020-02-05 (×6): 25 ug via INTRAVENOUS

## 2020-02-05 MED ORDER — ROCURONIUM BROMIDE 100 MG/10ML IV SOLN
INTRAVENOUS | Status: DC | PRN
  Administered 2020-02-05: 50 mg via INTRAVENOUS

## 2020-02-05 MED ORDER — PROPOFOL 10 MG/ML IV BOLUS
INTRAVENOUS | Status: DC | PRN
  Administered 2020-02-05 (×2): 100 mg via INTRAVENOUS

## 2020-02-05 MED ORDER — LIDOCAINE HCL 1 % IJ SOLN
INTRAMUSCULAR | Status: DC | PRN
  Administered 2020-02-05: 14 mL

## 2020-02-05 MED ORDER — BACITRACIN 500 UNIT/GM EX OINT
TOPICAL_OINTMENT | CUTANEOUS | Status: DC | PRN
  Administered 2020-02-05: 1 via TOPICAL

## 2020-02-05 MED ORDER — MIDAZOLAM HCL 2 MG/2ML IJ SOLN
INTRAMUSCULAR | Status: DC | PRN
  Administered 2020-02-05: 2 mg via INTRAVENOUS

## 2020-02-05 MED ORDER — FENTANYL CITRATE (PF) 100 MCG/2ML IJ SOLN
INTRAMUSCULAR | Status: DC | PRN
  Administered 2020-02-05 (×2): 50 ug via INTRAVENOUS

## 2020-02-05 SURGICAL SUPPLY — 35 items
APL PRP STRL LF DISP 70% ISPRP (MISCELLANEOUS) ×1
BLADE CLIPPER SURG (BLADE) ×2 IMPLANT
BLADE SURG 15 STRL LF DISP TIS (BLADE) ×1 IMPLANT
BLADE SURG 15 STRL SS (BLADE) ×2
BRIEF STRETCH MATERNITY 2XLG (MISCELLANEOUS) ×2 IMPLANT
CANISTER SUCT 1200ML W/VALVE (MISCELLANEOUS) ×2 IMPLANT
CHLORAPREP W/TINT 26 (MISCELLANEOUS) ×2 IMPLANT
COVER WAND RF STERILE (DRAPES) ×1 IMPLANT
DRAIN PENROSE 1/4X12 LTX STRL (WOUND CARE) IMPLANT
DRAPE LAPAROTOMY 77X122 PED (DRAPES) ×2 IMPLANT
DRSG GAUZE FLUFF 36X18 (GAUZE/BANDAGES/DRESSINGS) ×3 IMPLANT
DRSG TELFA 4X3 1S NADH ST (GAUZE/BANDAGES/DRESSINGS) ×3 IMPLANT
ELECT REM PT RETURN 9FT ADLT (ELECTROSURGICAL) ×2
ELECTRODE REM PT RTRN 9FT ADLT (ELECTROSURGICAL) ×1 IMPLANT
GAUZE SPONGE 4X4 12PLY STRL (GAUZE/BANDAGES/DRESSINGS) IMPLANT
GLOVE BIOGEL PI IND STRL 7.5 (GLOVE) ×1 IMPLANT
GLOVE BIOGEL PI INDICATOR 7.5 (GLOVE) ×1
GOWN STRL REUS W/ TWL LRG LVL3 (GOWN DISPOSABLE) ×1 IMPLANT
GOWN STRL REUS W/ TWL XL LVL3 (GOWN DISPOSABLE) ×1 IMPLANT
GOWN STRL REUS W/TWL LRG LVL3 (GOWN DISPOSABLE) ×2
GOWN STRL REUS W/TWL XL LVL3 (GOWN DISPOSABLE) ×2
KIT TURNOVER KIT A (KITS) ×2 IMPLANT
LABEL OR SOLS (LABEL) ×2 IMPLANT
NDL HYPO 25X1 1.5 SAFETY (NEEDLE) ×1 IMPLANT
NEEDLE HYPO 25X1 1.5 SAFETY (NEEDLE) ×2 IMPLANT
NS IRRIG 500ML POUR BTL (IV SOLUTION) ×2 IMPLANT
PACK BASIN MINOR (MISCELLANEOUS) ×2 IMPLANT
SUPPORETR ATHLETIC LG (MISCELLANEOUS) IMPLANT
SUPPORTER ATHLETIC LG (MISCELLANEOUS) ×2
SUT CHROMIC 3 0 PS 2 (SUTURE) ×2 IMPLANT
SUT ETHILON 3-0 FS-10 30 BLK (SUTURE)
SUT VIC AB 2-0 SH 27 (SUTURE) ×4
SUT VIC AB 2-0 SH 27XBRD (SUTURE) ×2 IMPLANT
SUTURE EHLN 3-0 FS-10 30 BLK (SUTURE) IMPLANT
SYR 10ML LL (SYRINGE) ×2 IMPLANT

## 2020-02-05 NOTE — Transfer of Care (Signed)
Immediate Anesthesia Transfer of Care Note  Patient: Aaron Cole  Procedure(s) Performed: HYDROCELECTOMY ADULT (Right )  Patient Location: PACU  Anesthesia Type:General  Level of Consciousness: awake, alert  and oriented  Airway & Oxygen Therapy: Patient Spontanous Breathing  Post-op Assessment: Report given to RN  Post vital signs: Reviewed and stable  Last Vitals:  Vitals Value Taken Time  BP 156/89 02/05/20 1330  Temp 36.2 C 02/05/20 1330  Pulse 65 02/05/20 1334  Resp 17 02/05/20 1334  SpO2 99 % 02/05/20 1334  Vitals shown include unvalidated device data.  Last Pain: There were no vitals filed for this visit.       Complications: No complications documented.

## 2020-02-05 NOTE — Anesthesia Preprocedure Evaluation (Signed)
Anesthesia Evaluation  Patient identified by MRN, date of birth, ID band Patient awake    Reviewed: Allergy & Precautions, H&P , NPO status , Patient's Chart, lab work & pertinent test results, reviewed documented beta blocker date and time   Airway Mallampati: II  TM Distance: >3 FB Neck ROM: full    Dental  (+) Teeth Intact   Pulmonary neg pulmonary ROS,    Pulmonary exam normal        Cardiovascular Exercise Tolerance: Good hypertension, On Medications + CAD  Normal cardiovascular exam Rate:Normal     Neuro/Psych  Headaches, negative psych ROS   GI/Hepatic negative GI ROS, (+) Hepatitis -  Endo/Other  negative endocrine ROS  Renal/GU negative Renal ROS  negative genitourinary   Musculoskeletal   Abdominal   Peds  Hematology negative hematology ROS (+)   Anesthesia Other Findings   Reproductive/Obstetrics negative OB ROS                             Anesthesia Physical Anesthesia Plan  ASA: III  Anesthesia Plan: General LMA   Post-op Pain Management:    Induction:   PONV Risk Score and Plan:   Airway Management Planned:   Additional Equipment:   Intra-op Plan:   Post-operative Plan:   Informed Consent: I have reviewed the patients History and Physical, chart, labs and discussed the procedure including the risks, benefits and alternatives for the proposed anesthesia with the patient or authorized representative who has indicated his/her understanding and acceptance.       Plan Discussed with: CRNA  Anesthesia Plan Comments:         Anesthesia Quick Evaluation

## 2020-02-05 NOTE — Op Note (Signed)
Date of procedure: 02/05/20  Preoperative diagnosis:  1. Right hydrocele  Postoperative diagnosis:  1. Same  Procedure: 1. Right hydrocelectomy  Surgeon: Nickolas Madrid, MD  Anesthesia: General  Complications: None  Intraoperative findings:  1.  Uncomplicated large right hydrocelectomy  EBL: Minimal  Specimens: None  Drains: None  Indication: Aaron Cole is a 69 y.o. patient with a large right hydrocele confirmed on scrotal ultrasound who presents for definitive management with hydrocelectomy today.  After reviewing the management options for treatment, they elected to proceed with the above surgical procedure(s). We have discussed the potential benefits and risks of the procedure, side effects of the proposed treatment, the likelihood of the patient achieving the goals of the procedure, and any potential problems that might occur during the procedure or recuperation. Informed consent has been obtained.  Description of procedure:  The patient was taken to the operating room and general anesthesia was induced. SCDs were placed for DVT prophylaxis. The patient was placed in the supine position, prepped and draped in the usual sterile fashion, and preoperative antibiotics(Ancef) were administered. A preoperative time-out was performed.   On exam, there is a very large 10 cm right-sided hydrocele that extended up towards the groin.  A 5 cm incision was made horizontally in the right hemiscrotum through dartos tissue, and the hydrocele sac was dissected free circumferentially.  The large hydrocele was then delivered through the incision and an incision made with drainage of 500 mL clear yellow fluid.  A portion of the hydrocele sac was excised.  The testicle appeared white and viable, though slightly atrophic.  A Jaboule repair was performed by inverting the hydrocele sac and closing it with a running 2-0 Vicryl suture.  Meticulous hemostasis was achieved with electrocautery in the  scrotum.  The wound was copiously irrigated.  A 2-0 Vicryl was used to close dartos in a running fashion, and the skin was closed with running chromic.  A dressing of bacitracin, fluffs, and jockstrap were applied.  Disposition: Stable to PACU  Plan: Okay to resume aspirin in 3 days Follow-up in clinic in 6 weeks for wound check  Nickolas Madrid, MD

## 2020-02-05 NOTE — Anesthesia Postprocedure Evaluation (Signed)
Anesthesia Post Note  Patient: Aaron Cole  Procedure(s) Performed: HYDROCELECTOMY ADULT (Right )  Patient location during evaluation: PACU Anesthesia Type: General Level of consciousness: awake and alert Pain management: pain level controlled Vital Signs Assessment: post-procedure vital signs reviewed and stable Respiratory status: spontaneous breathing, nonlabored ventilation, respiratory function stable and patient connected to nasal cannula oxygen Cardiovascular status: blood pressure returned to baseline and stable Postop Assessment: no apparent nausea or vomiting Anesthetic complications: no   No complications documented.   Last Vitals:  Vitals:   02/05/20 1345 02/05/20 1350  BP: (!) 160/94   Pulse: 65 64  Resp: 12 12  Temp:    SpO2: 98% 98%    Last Pain:  Vitals:   02/05/20 1350  PainSc: Castalia Ivo Moga

## 2020-02-05 NOTE — Discharge Instructions (Signed)

## 2020-02-05 NOTE — H&P (Signed)
02/05/20 11:31 AM   Aaron Cole March 16, 1951 [id number]  CC: Right hydrocele  HPI: Aaron Cole is a 69 year old male with a large right hydrocele and persistent bothersome symptoms and pain who is opted for hydrocelectomy for treatment.  He denies any urinary symptoms.  He denies any left-sided scrotal pain.  Ultrasound has confirmed right-sided large hydrocele.  PMH: Past Medical History:  Diagnosis Date   CAD (coronary artery disease) 2013   per CT scan  - brings CD of CT, no report - will need [facility] records of CT scan report   CKD (chronic kidney disease) stage 3, GFR 30-59 ml/min 2015   iatrogenic (decreased nephron mass) established with Dr. Alfonse Cole [facility])   Diverticulosis    by CT scan   Hepatitis C virus infection without hepatic coma 03/02/2019   History of hepatitis B virus infection    spontaneously cleared   HTN (hypertension)    Renal cell carcinoma ([place]) 2013   clear cell s/p R nephrectomy   S/p nephrectomy 2013   [facility]   Sinus headache    recurrent    Surgical History: Past Surgical History:  Procedure Laterality Date   BENTALL PROCEDURE N/A 07/05/2015   Procedure: BENTALL PROCEDURE UTILIZING A 28 X 10 MM WOVEN DOUBLE VELOUR VASCULAR GRAFT AND A 28MM GELWEAVE WOVEN VASCULAR GRAFT. 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.;  Surgeon: Aaron Pollack, MD;  Location: [place] OR;  Service: Open Heart Surgery;  Laterality: N/A;   COLONOSCOPY  11/2008   WNL records in chart [facility])   COLONOSCOPY WITH PROPOFOL N/A 02/24/2019   TA, [facility], rpt 5 yrs (Aaron Cole)   CORONARY ANGIOPLASTY     NASAL SINUS SURGERY  1978   sinus surgery done at [place] Right 12/2011   [facility], R for RCC   rectal fistula repair  1980s   TEE WITHOUT CARDIOVERSION N/A 07/05/2015   Procedure: TRANSESOPHAGEAL ECHOCARDIOGRAM (TEE);  Surgeon: Aaron Pollack, MD;  Location: [place];  Service: Open Heart Surgery;  Laterality: N/A;     Family History: Family History    Problem Relation Age of Onset   Cancer Paternal Grandmother        throat   Heart disease Mother    Hypertension Mother    Heart disease Father    Diabetes Father    Colon polyps Father    Crohn's disease Father    Coronary artery disease Maternal Uncle        MI   Cancer Paternal Aunt        breast    Cancer Paternal Aunt        brain   Colon polyps Brother    CAD Maternal Grandfather    Stroke Neg Hx     Social History:  reports that he has never smoked. He has never used smokeless tobacco. He reports previous alcohol use. He reports that he does not use drugs.  Physical Exam: Constitutional:  Alert and oriented, No acute distress. Cardiovascular: Regular rate and rhythm Respiratory: Clear to auscultation bilaterally GI: Abdomen is soft, nontender, nondistended, no abdominal masses GU: Large right hydrocele  Assessment & Plan:   69 year old male with a large right hydrocele here today for hydrocelectomy.  We discussed the risks and benefits of surgery including bleeding, infection, recurrence, bruising/swelling, possible need for additional procedures, and post-operative pain.  Right hydrocelectomy today  Aaron Madrid, MD 02/05/2020  [facility] [address]  [place], [place] [address] [phone]

## 2020-02-05 NOTE — Anesthesia Procedure Notes (Signed)
Procedure Name: Intubation Date/Time: 02/05/2020 12:21 PM Performed by: Louann Sjogren, CRNA Pre-anesthesia Checklist: Patient identified, Patient being monitored, Timeout performed, Emergency Drugs available and Suction available Patient Re-evaluated:Patient Re-evaluated prior to induction Oxygen Delivery Method: Circle system utilized Preoxygenation: Pre-oxygenation with 100% oxygen Induction Type: IV induction Ventilation: Mask ventilation without difficulty Laryngoscope Size: Mac and 3 Grade View: Grade I Tube type: Oral Tube size: 7.0 mm Number of attempts: 1 Airway Equipment and Method: Stylet Placement Confirmation: ETT inserted through vocal cords under direct vision,  positive ETCO2 and breath sounds checked- equal and bilateral Secured at: 21 cm Tube secured with: Tape Dental Injury: Teeth and Oropharynx as per pre-operative assessment

## 2020-02-06 ENCOUNTER — Encounter: Payer: Self-pay | Admitting: Urology

## 2020-02-22 ENCOUNTER — Telehealth: Payer: Self-pay | Admitting: Interventional Cardiology

## 2020-02-22 MED ORDER — AMLODIPINE BESYLATE 2.5 MG PO TABS
2.5000 mg | ORAL_TABLET | Freq: Every day | ORAL | 3 refills | Status: DC
Start: 2020-02-22 — End: 2021-02-06

## 2020-02-22 NOTE — Telephone Encounter (Signed)
Amlodipine increased to 5mg  in May.  Was taking in the morning but switched to night due to lightheadedness and dizziness.  Waking up in the mornings feeling groggy and can't work.  Hasn't checked BP recently at home due to dead batteries. BP at PCP on 02-07-2023 was 100/70.  Denies any other issues.  Pt would like to go back to 2.5mg  QD and monitor BP if Dr. Tamala Julian is agreeable?

## 2020-02-22 NOTE — Telephone Encounter (Signed)
Go back to Amlodipine 2.5 mg daily to see if improves.

## 2020-02-22 NOTE — Telephone Encounter (Signed)
    Pt c/o medication issue:  1. Name of Medication: amLODipine (NORVASC) 5 MG tablet  2. How are you currently taking this medication (dosage and times per day)?Take 1 tablet (5 mg total) by mouth daily.Patient taking differently: Take 5 mg by mouth at bedtime.   3. Are you having a reaction (difficulty breathing--STAT)?   4. What is your medication issue? Pt said he's having a reaction with this medications. He said he gets dizzy and lightheaded to the point he cant work.

## 2020-02-22 NOTE — Telephone Encounter (Signed)
Spoke with pt and made him aware of recommendations.  Pt verbalized understanding and was appreciative for call.  

## 2020-02-23 DIAGNOSIS — B1920 Unspecified viral hepatitis C without hepatic coma: Secondary | ICD-10-CM | POA: Diagnosis not present

## 2020-02-23 DIAGNOSIS — R945 Abnormal results of liver function studies: Secondary | ICD-10-CM | POA: Diagnosis not present

## 2020-02-25 ENCOUNTER — Encounter: Payer: Self-pay | Admitting: Gastroenterology

## 2020-02-25 LAB — HCV RNA QUANT RFLX ULTRA OR GENOTYP: HCV Quant Baseline: NOT DETECTED IU/mL

## 2020-02-25 LAB — HEPATIC FUNCTION PANEL
ALT: 10 IU/L (ref 0–44)
AST: 18 IU/L (ref 0–40)
Albumin: 4.3 g/dL (ref 3.8–4.8)
Alkaline Phosphatase: 94 IU/L (ref 48–121)
Bilirubin Total: 0.4 mg/dL (ref 0.0–1.2)
Bilirubin, Direct: 0.12 mg/dL (ref 0.00–0.40)
Total Protein: 7.6 g/dL (ref 6.0–8.5)

## 2020-03-01 ENCOUNTER — Ambulatory Visit (INDEPENDENT_AMBULATORY_CARE_PROVIDER_SITE_OTHER): Payer: Medicare Other | Admitting: Gastroenterology

## 2020-03-01 ENCOUNTER — Other Ambulatory Visit: Payer: Self-pay

## 2020-03-01 VITALS — BP 114/76 | HR 76 | Temp 98.3°F | Ht 71.0 in | Wt 216.0 lb

## 2020-03-01 DIAGNOSIS — B1920 Unspecified viral hepatitis C without hepatic coma: Secondary | ICD-10-CM | POA: Diagnosis not present

## 2020-03-01 NOTE — Progress Notes (Signed)
   [name] MD, MRCP(U.K) [address]  [place], [place] [address]  Main: [phone]  Fax: [phone]   Primary Care Physician: [name], MD  Primary Gastroenterologist:  Dr. [name]   Hepatitis C follow up  HPI: Aaron Cole is a 69 y.o. male   Summary of history :  Initially referred and seen on 02/16/2019.CMP  01/08/2019: AST and ALT at 210 and 307 respectively. Albumin of 4.1. Hepatitis C viral antibody was positive. Viral load of 2.75 million. Hepatitis B core total antibody positive. Viral hepatitis and autoimmune hepatitis panel otherwise negative. Hepatitis A total antibody positive.  01/19/2019 right upper quadrant ultrasound showed cholelithiasis. Status post right nephrectomy.   02/20/2019: Ultrasound right upper quadrant with elastography: F2 and F3 fibrosis. 04/03/2019: LFTs normal previously elevated Completed hepatitis C treatment in early December 2020. 04/23/2019: Hepatic function panel: Normal.   Interval history3/07/2019-03/01/2020  02/23/2020: Hepatitis C virus not detected hepatic function panel normal Doing well no complaints    Current Outpatient Medications  Medication Sig Dispense Refill  . amLODipine (NORVASC) 2.5 MG tablet Take 1 tablet (2.5 mg total) by mouth daily. 90 tablet 3  . aspirin EC 81 MG tablet Take 81 mg by mouth every evening. Swallow whole.    . cholecalciferol (VITAMIN D) 25 MCG (1000 UNIT) tablet Take 1,000 Units by mouth daily.    . fluticasone (FLONASE) 50 MCG/ACT nasal spray INHALE 2 SPRAYS INTO EACH NOSTRIL EVERY DAY. (Patient taking differently: Place 2 sprays into both nostrils daily as needed (allergies.). ) 16 g 3  . Multiple Vitamin (MULTIVITAMIN WITH MINERALS) TABS tablet Take 1 tablet by mouth daily.    . Omega-3 Fatty Acids (FISH OIL) 1000 MG CAPS Take 1 capsule (1,000 mg total) by mouth daily.  0  . telmisartan (MICARDIS) 80 MG tablet TAKE ONE-HALF (1/2) OF A TABLET BY MOUTH  TWICE DAILY. (Patient taking differently: 80 mg. TAKE ONE TABLET BY MOUTH DAILY.) 90 tablet 3   No current facility-administered medications for this visit.    Allergies as of 03/01/2020 - Review Complete 02/05/2020  Allergen Reaction Noted  . Carvedilol Other (See Comments) 12/19/2016    ROS:  General: Negative for anorexia, weight loss, fever, chills, fatigue, weakness. ENT: Negative for hoarseness, difficulty swallowing , nasal congestion. CV: Negative for chest pain, angina, palpitations, dyspnea on exertion, peripheral edema.  Respiratory: Negative for dyspnea at rest, dyspnea on exertion, cough, sputum, wheezing.  GI: See history of present illness. GU:  Negative for dysuria, hematuria, urinary incontinence, urinary frequency, nocturnal urination.  Endo: Negative for unusual weight change.    Physical Examination:   There were no vitals taken for this visit.  General: Well-nourished, well-developed in no acute distress.  Eyes: No icterus. Conjunctivae pink. Neuro: Alert and oriented x 3.  Grossly intact. Skin: Warm and dry, no jaundice.   Psych: Alert and cooperative, normal mood and affect.   Imaging Studies: No results found.  Assessment and Plan:   Aaron Cole is a 69 y.o. y/o male  here to follow-up forhepatitis C genotype 1a, treatment nave, noncirrhotic. 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  Status post treatment of hepatitis C and he has been cured no virus detected 8 months after treatment.  I will scan his liver once again to ensure he has no evidence of cirrhosis radiologically.    Dr [name]  MD,MRCP [facility]) Follow up as needed

## 2020-03-08 ENCOUNTER — Ambulatory Visit: Payer: Medicare Other

## 2020-03-15 ENCOUNTER — Ambulatory Visit
Admission: RE | Admit: 2020-03-15 | Discharge: 2020-03-15 | Disposition: A | Discharge status: Home / Self Care | Payer: Medicare Other | Source: Ambulatory Visit | Attending: Gastroenterology | Admitting: Gastroenterology

## 2020-03-15 ENCOUNTER — Other Ambulatory Visit: Payer: Self-pay

## 2020-03-15 DIAGNOSIS — B1920 Unspecified viral hepatitis C without hepatic coma: Secondary | ICD-10-CM | POA: Diagnosis not present

## 2020-03-15 DIAGNOSIS — K8020 Calculus of gallbladder without cholecystitis without obstruction: Secondary | ICD-10-CM | POA: Diagnosis not present

## 2020-03-18 ENCOUNTER — Ambulatory Visit: Payer: Medicare Other | Admitting: Urology

## 2020-03-21 ENCOUNTER — Telehealth: Payer: Self-pay

## 2020-03-21 ENCOUNTER — Encounter: Payer: Self-pay | Admitting: Gastroenterology

## 2020-03-21 NOTE — Telephone Encounter (Signed)
Called to discuss results. Unable to reach patient. Left patient a message to call back.

## 2020-03-21 NOTE — Telephone Encounter (Signed)
-----   Message from Kiran Anna, MD sent at 03/21/2020 12:17 PM EDT ----- Jadijah inform   [facility] shows liver cirrhosis- set up follow up visit in a few weeks to discuss next steps   

## 2020-03-22 ENCOUNTER — Encounter: Payer: Self-pay | Admitting: Family Medicine

## 2020-03-23 ENCOUNTER — Ambulatory Visit (INDEPENDENT_AMBULATORY_CARE_PROVIDER_SITE_OTHER): Payer: Medicare Other | Admitting: Urology

## 2020-03-23 ENCOUNTER — Encounter: Payer: Self-pay | Admitting: Urology

## 2020-03-23 ENCOUNTER — Other Ambulatory Visit: Payer: Self-pay

## 2020-03-23 VITALS — BP 126/81 | HR 80 | Ht 71.0 in | Wt 216.0 lb

## 2020-03-23 DIAGNOSIS — N432 Other hydrocele: Secondary | ICD-10-CM

## 2020-03-23 DIAGNOSIS — N529 Male erectile dysfunction, unspecified: Secondary | ICD-10-CM

## 2020-03-23 MED ORDER — SILDENAFIL CITRATE 20 MG PO TABS: ORAL_TABLET | ORAL | 6 refills | Status: DC

## 2020-03-23 NOTE — Patient Instructions (Signed)
Sildenafil tablets (Erectile Dysfunction) What is this medicine? SILDENAFIL (sil DEN a fil) is used to treat erection problems in men. This medicine may be used for other purposes; ask your health care provider or pharmacist if you have questions. COMMON BRAND NAME(S): Viagra What should I tell my health care provider before I take this medicine? They need to know if you have any of these conditions:  bleeding disorders  eye or vision problems, including a rare inherited eye disease called retinitis pigmentosa  anatomical deformation of the penis, Peyronie's disease, or history of priapism (painful and prolonged erection)  heart disease, angina, a history of heart attack, irregular heart beats, or other heart problems  high or low blood pressure  history of blood diseases, like sickle cell anemia or leukemia  history of stomach bleeding  kidney disease  liver disease  stroke  an unusual or allergic reaction to sildenafil, other medicines, foods, dyes, or preservatives  pregnant or trying to get pregnant  breast-feeding How should I use this medicine? Take this medicine by mouth with a glass of water. Follow the directions on the prescription label. The dose is usually taken 1 hour before sexual activity. You should not take the dose more than once per day. Do not take your medicine more often than directed. Talk to your pediatrician regarding the use of this medicine in children. This medicine is not used in children for this condition. Overdosage: If you think you have taken too much of this medicine contact a poison control center or emergency room at once. NOTE: This medicine is only for you. Do not share this medicine with others. What if I miss a dose? This does not apply. Do not take double or extra doses. What may interact with this medicine? Do not take this medicine with any of the following medications:  cisapride  nitrates like amyl nitrite, isosorbide  dinitrate, isosorbide mononitrate, nitroglycerin  riociguat This medicine may also interact with the following medications:  antiviral medicines for HIV or AIDS  bosentan  certain medicines for benign prostatic hyperplasia (BPH)  certain medicines for blood pressure  certain medicines for fungal infections like ketoconazole and itraconazole  cimetidine  erythromycin  rifampin This list may not describe all possible interactions. Give your health care provider a list of all the medicines, herbs, non-prescription drugs, or dietary supplements you use. Also tell them if you smoke, drink alcohol, or use illegal drugs. Some items may interact with your medicine. What should I watch for while using this medicine? If you notice any changes in your vision while taking this drug, call your doctor or health care professional as soon as possible. Stop using this medicine and call your health care provider right away if you have a loss of sight in one or both eyes. Contact your doctor or health care professional right away if you have an erection that lasts longer than 4 hours or if it becomes painful. This may be a sign of a serious problem and must be treated right away to prevent permanent damage. If you experience symptoms of nausea, dizziness, chest pain or arm pain upon initiation of sexual activity after taking this medicine, you should refrain from further activity and call your doctor or health care professional as soon as possible. Do not drink alcohol to excess (examples, 5 glasses of wine or 5 shots of whiskey) when taking this medicine. When taken in excess, alcohol can increase your chances of getting a headache or getting dizzy, increasing  your heart rate or lowering your blood pressure. Using this medicine does not protect you or your partner against HIV infection (the virus that causes AIDS) or other sexually transmitted diseases. What side effects may I notice from receiving this  medicine? Side effects that you should report to your doctor or health care professional as soon as possible:  allergic reactions like skin rash, itching or hives, swelling of the face, lips, or tongue  breathing problems  changes in hearing  changes in vision  chest pain  fast, irregular heartbeat  prolonged or painful erection  seizures Side effects that usually do not require medical attention (report to your doctor or health care professional if they continue or are bothersome):  back pain  dizziness  flushing  headache  indigestion  muscle aches  nausea  stuffy or runny nose This list may not describe all possible side effects. Call your doctor for medical advice about side effects. You may report side effects to FDA at 1-800-FDA-1088. Where should I keep my medicine? Keep out of reach of children. Store at room temperature between 15 and 30 degrees C (59 and 86 degrees F). Throw away any unused medicine after the expiration date. NOTE: This sheet is a summary. It may not cover all possible information. If you have questions about this medicine, talk to your doctor, pharmacist, or health care provider.  2020 Elsevier/Gold Standard (2015-06-01 12:00:25)  

## 2020-03-23 NOTE — Progress Notes (Signed)
° °  03/23/2020 4:00 PM   OLYVER HAWES 05-Mar-1951 [id number]  Reason for visit: Follow up right hydrocelectomy  HPI: I saw Mr. Aaron Cole today for 6-week follow-up after undergoing an uncomplicated right hydrocelectomy for a large 10 cm hydrocele on 02/05/2020.  He reports has been doing well since surgery and his incision has healed well.  He still having some swelling in the right scrotum, but denies any pain or drainage.  He continues to take sildenafil as needed for erectile dysfunction.  On exam, he has some swelling in the right scrotum consistent with postoperative healing after hydrocelectomy.  His incision is well-healed with no erythema or drainage.  I encouraged him to continue to wear snug fitting underwear, and that his swelling will continue to improve after surgery.  He had a very large hydrocele with ~572mL fluid, and we discussed the healing process sometimes takes 3 to 4 months before swelling is completely resolved.  Sildenafil refilled Follow-up as needed if persistent scrotal swelling  Billey Co, MD  [facility] [address] [place], [place] [address] [phone]

## 2020-04-07 ENCOUNTER — Other Ambulatory Visit: Payer: Self-pay | Admitting: Nurse Practitioner

## 2020-04-19 ENCOUNTER — Telehealth: Payer: Self-pay

## 2020-04-19 NOTE — Telephone Encounter (Signed)
Pt has been notified of results and Dr. Anna's recommendations. 

## 2020-04-19 NOTE — Telephone Encounter (Signed)
-----   Message from [name], MD sent at 03/21/2020 12:17 PM EDT ----- Sherald Hess inform   [facility] shows liver cirrhosis- set up follow up visit in a few weeks to discuss next steps

## 2020-05-16 ENCOUNTER — Ambulatory Visit (INDEPENDENT_AMBULATORY_CARE_PROVIDER_SITE_OTHER): Payer: Medicare Other | Admitting: Gastroenterology

## 2020-05-16 ENCOUNTER — Other Ambulatory Visit: Payer: Self-pay

## 2020-05-16 VITALS — BP 137/89 | HR 74 | Temp 98.3°F | Ht 71.0 in | Wt 221.0 lb

## 2020-05-16 DIAGNOSIS — K7460 Unspecified cirrhosis of liver: Secondary | ICD-10-CM

## 2020-05-16 NOTE — Progress Notes (Signed)
[name] MD, MRCP(U.K) [address]  [place]  [place], [place] [address]  Main: [phone]  Fax: [phone]   Primary Care Physician: [name], MD  Primary Gastroenterologist:  Dr. [name]   Liver cirrhosis follow-up HPI: Aaron Cole is a 69 y.o. male    Summary of history :  Initially referred and seen on 02/16/2019.CMP 01/08/2019:AST and ALT at 210 and 307 respectively. Albumin of 4.1. Hepatitis C viral antibody was positive. Viral load of 2.75 million. Hepatitis B core total antibody positive. Viral hepatitis and autoimmune hepatitis panel otherwise negative. Hepatitis A total antibody positive.  01/19/2019 right upper quadrant ultrasound showed cholelithiasis. Status post right nephrectomy.   02/20/2019: Ultrasound right upper quadrant with elastography: F2 and F3 fibrosis. 04/03/2019: LFTs normal previously elevated Completed hepatitis C treatment in early December 2020. 04/23/2019: Hepatic function panel: Normal. 02/23/2020: Hepatitis C virus not detected hepatic function panel normal  Interval history3/07/2019-03/01/2020   03/16/2020: Ultrasound right upper quadrant demonstrates findings suggestive of hepatic cirrhosis. No prior EGD to screen for varices.   Brought in here to discuss the results.  Doing well no complaints     Current Outpatient Medications  Medication Sig Dispense Refill   amLODipine (NORVASC) 2.5 MG tablet Take 1 tablet (2.5 mg total) by mouth daily. 90 tablet 3   aspirin EC 81 MG tablet Take 81 mg by mouth every evening. Swallow whole.     cholecalciferol (VITAMIN D) 25 MCG (1000 UNIT) tablet Take 1,000 Units by mouth daily.     fluticasone (FLONASE) 50 MCG/ACT nasal spray INHALE 2 SPRAYS INTO EACH NOSTRIL EVERY DAY. (Patient taking differently: Place 2 sprays into both nostrils daily as needed (allergies.). ) 16 g 3   Multiple Vitamin (MULTIVITAMIN WITH MINERALS) TABS tablet Take 1 tablet by  mouth daily.     Omega-3 Fatty Acids (FISH OIL) 1000 MG CAPS Take 1 capsule (1,000 mg total) by mouth daily.  0   sildenafil (REVATIO) 20 MG tablet Take 3-5 tablets as needed before sexual activity 30 tablet 6   telmisartan (MICARDIS) 80 MG tablet Take 0.5 tablets (40 mg total) by mouth 2 (two) times daily. TAKE ONE-HALF (1/2) OF A TABLET BY MOUTH TWICE DAILY. 90 tablet 3   No current facility-administered medications for this visit.    Allergies as of 05/16/2020 - Review Complete 03/23/2020  Allergen Reaction Noted   Carvedilol Other (See Comments) 12/19/2016    ROS:  General: Negative for anorexia, weight loss, fever, chills, fatigue, weakness. ENT: Negative for hoarseness, difficulty swallowing , nasal congestion. CV: Negative for chest pain, angina, palpitations, dyspnea on exertion, peripheral edema.  Respiratory: Negative for dyspnea at rest, dyspnea on exertion, cough, sputum, wheezing.  GI: See history of present illness. GU:  Negative for dysuria, hematuria, urinary incontinence, urinary frequency, nocturnal urination.  Endo: Negative for unusual weight change.    Physical Examination:   There were no vitals taken for this visit.  General: Well-nourished, well-developed in no acute distress.  Eyes: No icterus. Conjunctivae pink. Neuro: Alert and oriented x 3.  Grossly intact. Skin: Warm and dry, no jaundice.   Psych: Alert and cooperative, normal mood and affect.   Imaging Studies: No results found.  Assessment and Plan:   Aaron Cole is a 69 y.o. y/o male y/o male  here to follow-up forhepatitis C genotype 1a, treatment nave, noncirrhotic. 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  Status post treatment of hepatitis C and he  has been cured no virus detected 8 months after treatment.  I will scan his liver once again to ensure he has no evidence of cirrhosis radiologically.  Plan 1.  Right upper quadrant ultrasound to screen  for [facility] in March 2021, every 6 monthly 2.  EGD to screen for esophageal varices 3.  Check labs to calculate meld score, check hepatitis a and B serology.  Recommended booster shot for COVID-19.  Flu shot.  Suggest to obtain pneumococcal vaccine if not already had one  I have discussed alternative options, risks & benefits,  which include, but are not limited to, bleeding, infection, perforation,respiratory complication & drug reaction.  The patient agrees with this plan & written consent will be obtained.      Dr [name]  MD,MRCP [facility]) Follow up in 6 months

## 2020-05-17 ENCOUNTER — Telehealth: Payer: Self-pay

## 2020-05-17 LAB — COMPREHENSIVE METABOLIC PANEL
ALT: 14 IU/L (ref 0–44)
AST: 18 IU/L (ref 0–40)
Albumin/Globulin Ratio: 1.5 (ref 1.2–2.2)
Albumin: 4.2 g/dL (ref 3.8–4.8)
Alkaline Phosphatase: 76 IU/L (ref 44–121)
BUN/Creatinine Ratio: 16 (ref 10–24)
BUN: 21 mg/dL (ref 8–27)
Bilirubin Total: 0.4 mg/dL (ref 0.0–1.2)
CO2: 22 mmol/L (ref 20–29)
Calcium: 9.2 mg/dL (ref 8.6–10.2)
Chloride: 104 mmol/L (ref 96–106)
Creatinine, Ser: 1.35 mg/dL — ABNORMAL HIGH (ref 0.76–1.27)
GFR calc Af Amer: 62 mL/min/{1.73_m2} (ref >59)
GFR calc non Af Amer: 54 mL/min/{1.73_m2} — ABNORMAL LOW (ref >59)
Globulin, Total: 2.8 g/dL (ref 1.5–4.5)
Glucose: 91 mg/dL (ref 65–99)
Potassium: 4.7 mmol/L (ref 3.5–5.2)
Sodium: 139 mmol/L (ref 134–144)
Total Protein: 7 g/dL (ref 6.0–8.5)

## 2020-05-17 LAB — CBC WITH DIFFERENTIAL/PLATELET
Basophils Absolute: 0 10*3/uL (ref 0.0–0.2)
Basos: 1 %
EOS (ABSOLUTE): 0.1 10*3/uL (ref 0.0–0.4)
Eos: 1 %
Hematocrit: 40.6 % (ref 37.5–51.0)
Hemoglobin: 13.5 g/dL (ref 13.0–17.7)
Immature Grans (Abs): 0 10*3/uL (ref 0.0–0.1)
Immature Granulocytes: 0 %
Lymphocytes Absolute: 2.4 10*3/uL (ref 0.7–3.1)
Lymphs: 34 %
MCH: 29.1 pg (ref 26.6–33.0)
MCHC: 33.3 g/dL (ref 31.5–35.7)
MCV: 88 fL (ref 79–97)
Monocytes Absolute: 0.7 10*3/uL (ref 0.1–0.9)
Monocytes: 9 %
Neutrophils Absolute: 3.9 10*3/uL (ref 1.4–7.0)
Neutrophils: 55 %
Platelets: 183 10*3/uL (ref 150–450)
RBC: 4.64 x10E6/uL (ref 4.14–5.80)
RDW: 14.2 % (ref 11.6–15.4)
WBC: 7.1 10*3/uL (ref 3.4–10.8)

## 2020-05-17 LAB — HEPATITIS B SURFACE ANTIBODY,QUALITATIVE: Hep B Surface Ab, Qual: REACTIVE

## 2020-05-17 LAB — PROTIME-INR
INR: 1 (ref 0.9–1.2)
Prothrombin Time: 10.3 s (ref 9.1–12.0)

## 2020-05-17 LAB — HEPATITIS A ANTIBODY, TOTAL: hep A Total Ab: NEGATIVE

## 2020-05-17 NOTE — Telephone Encounter (Signed)
-----   Message from [name], MD sent at 05/17/2020  8:14 AM EST ----- Immune to hep B, needs booster for Hep A

## 2020-05-21 DIAGNOSIS — Z23 Encounter for immunization: Secondary | ICD-10-CM | POA: Diagnosis not present

## 2020-06-08 IMAGING — US US ABDOMEN LIMITED W/ ELASTOGRAPHY
1 series · 13 of 25 positions shown · non-contrast
Comparison: Ultrasound 01/19/2019

CLINICAL DATA: Hepatitis-C.  RIGHT nephrectomy

EXAM:
US ABDOMEN LIMITED - RIGHT UPPER QUADRANT
ULTRASOUND HEPATIC ELASTOGRAPHY
TECHNIQUE: Limited right upper quadrant abdominal ultrasound was performed. In
addition, ultrasound elastography evaluation of the liver was
performed. A region of interest was placed in the right lobe of the
liver. Following application of a compressive sonographic pulse,
shear waves were detected in the adjacent hepatic tissue and the
shear wave velocity was calculated. Multiple assessments were
performed at the selected site. Median shear wave velocity is
correlated to a Metavir fibrosis score.

[Series 1: us abdomen limited w/ elastography · 13 of 49 slices shown]
[im 1/49]
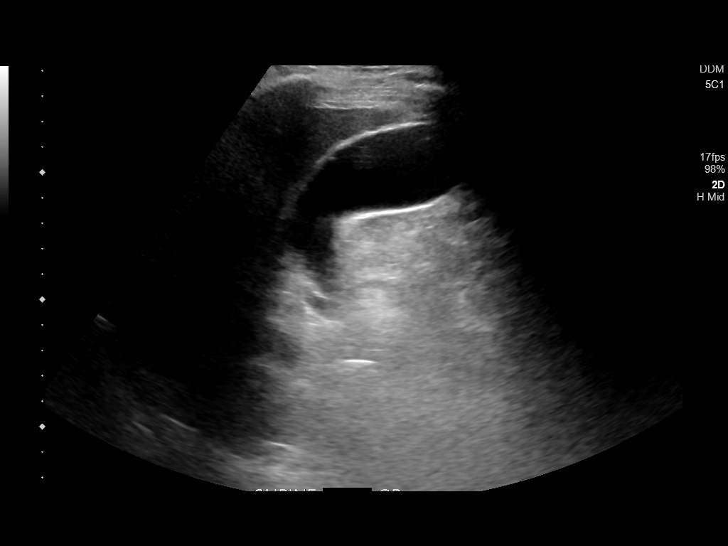
[im 5/49]
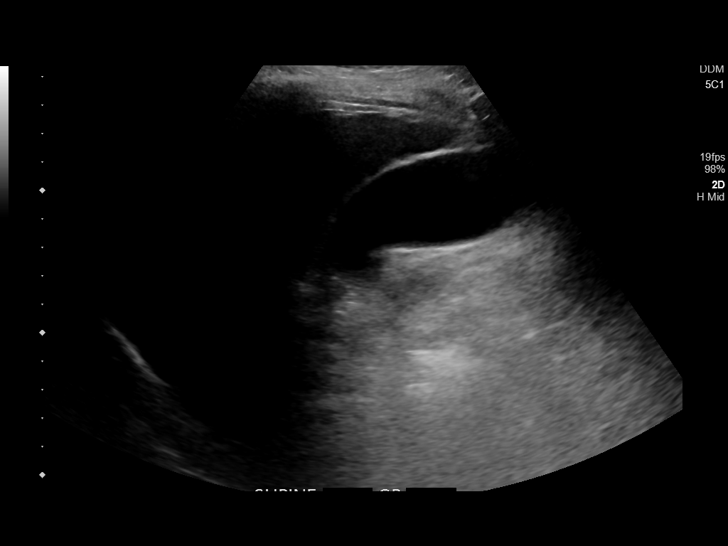
[im 9/49]
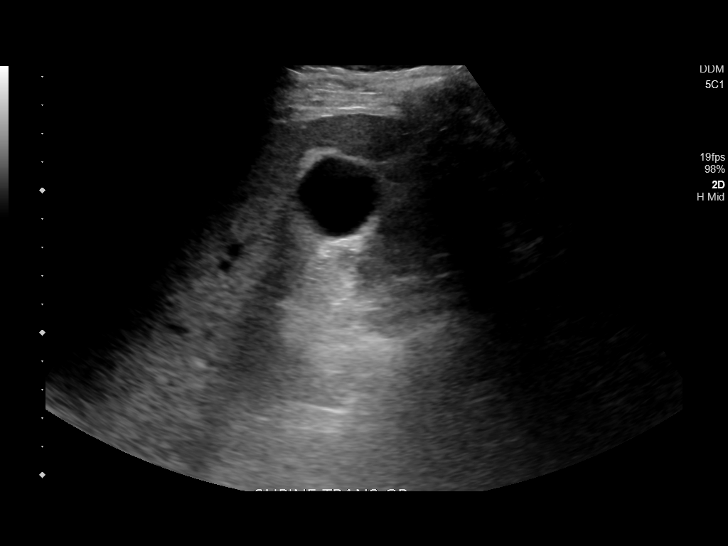
[im 13/49]
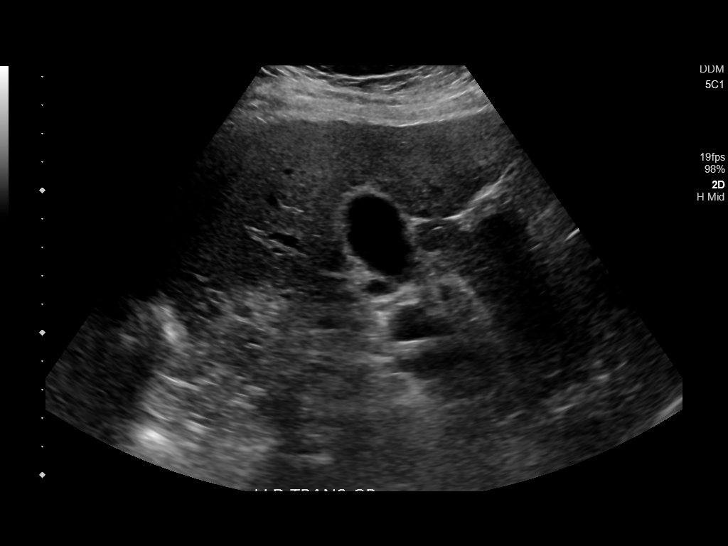
[im 17/49]
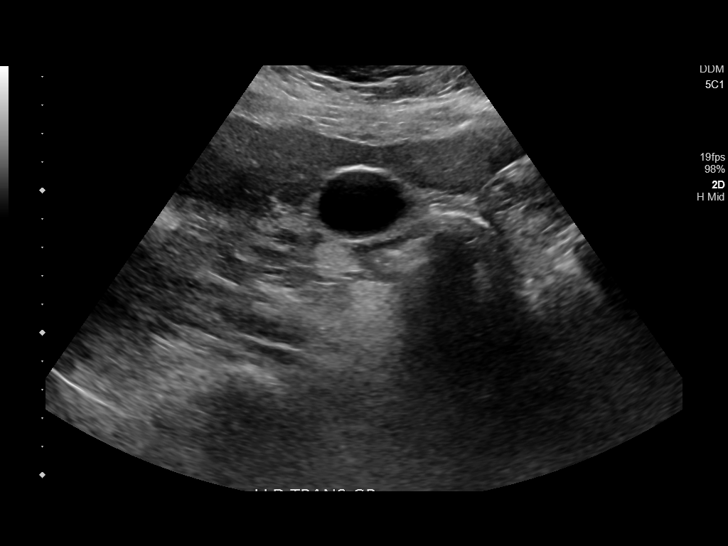
[im 21/49]
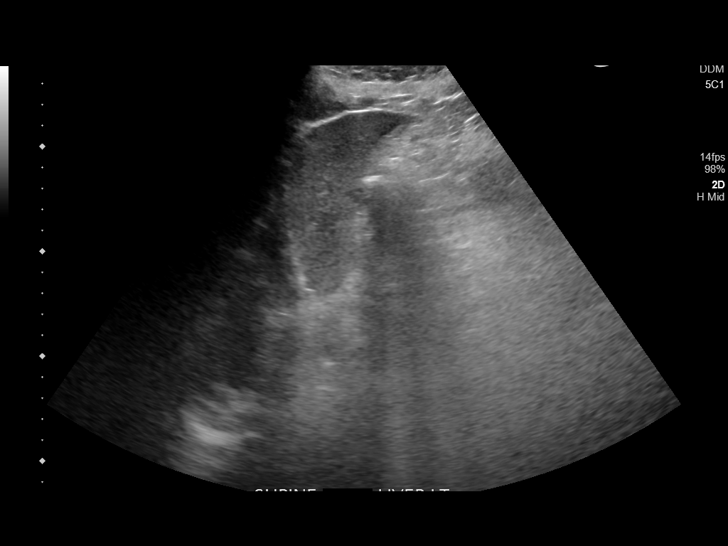
[im 25/49]
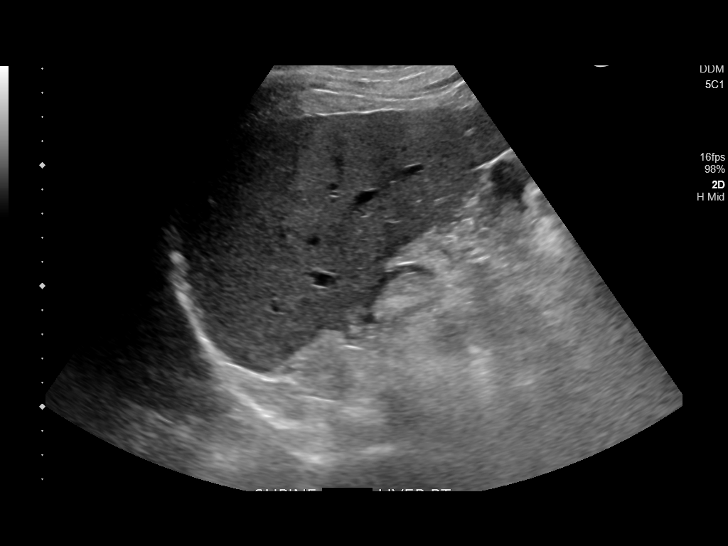
[im 29/49]
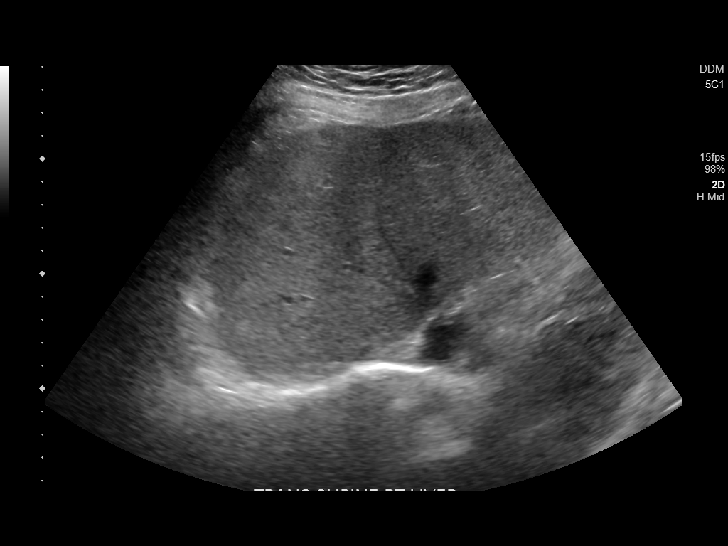
[im 33/49]
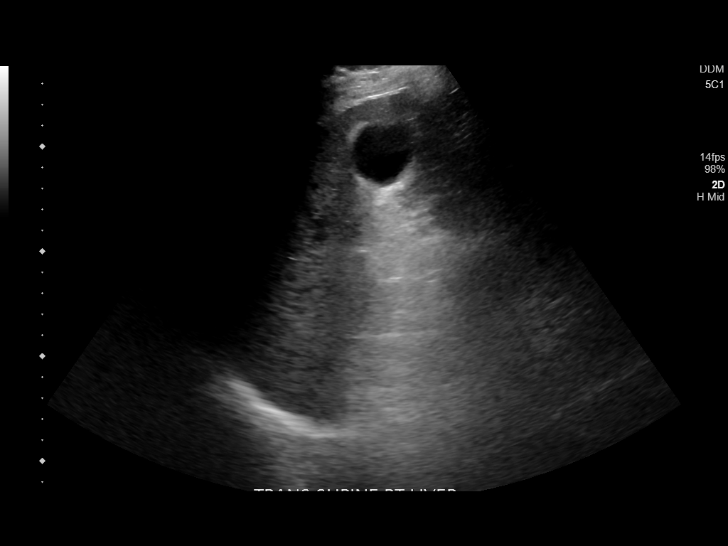
[im 37/49]
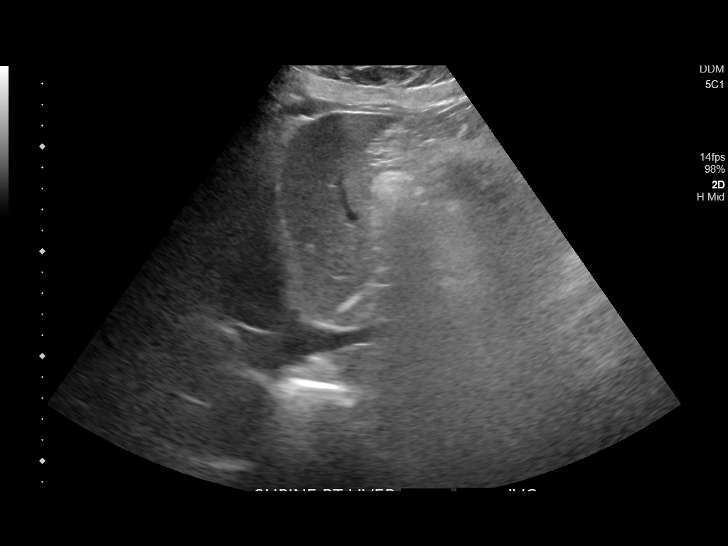
[im 41/49]
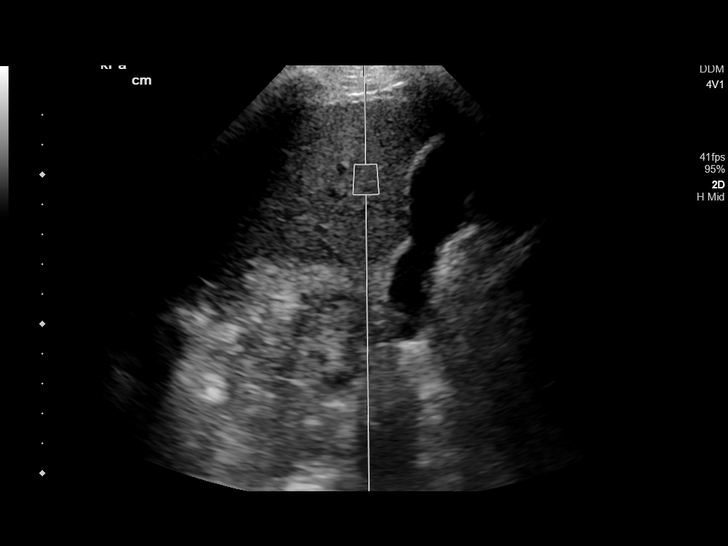
[im 45/49]
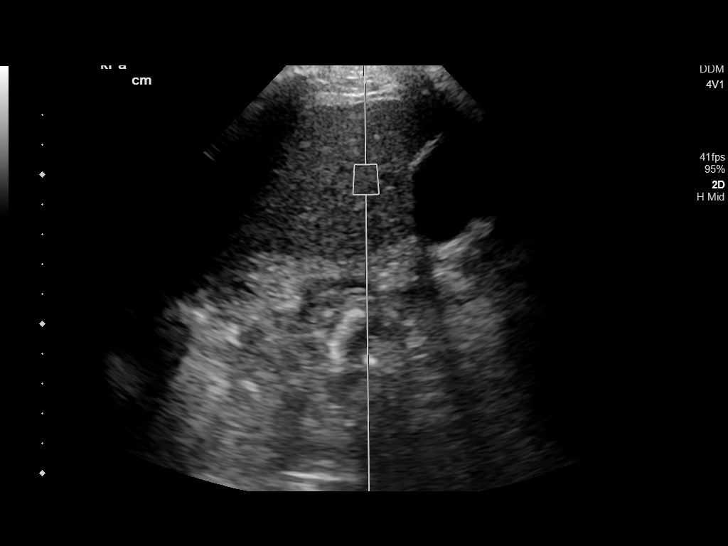
[im 49/49]
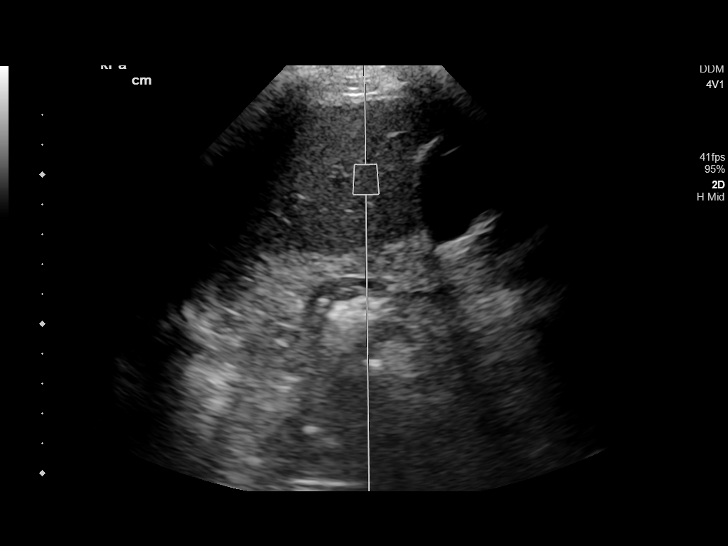

[13 of 25 positions shown; findings below may reference images not displayed]

FINDINGS: ULTRASOUND ABDOMEN LIMITED RIGHT UPPER QUADRANT

Gallbladder:

No gallstones or wall thickening visualized. No sonographic Murphy
sign noted.

Common bile duct:

Diameter: Normal 2.8 mm

Liver:

No focal lesion identified. Mild nodular contour of the liver.
Within normal limits in parenchymal echogenicity. Portal vein is
patent on color Doppler imaging with normal direction of blood flow
towards the liver.

ULTRASOUND HEPATIC ELASTOGRAPHY

Device: Siemens Helix VTQ

Patient position: Supine

Transducer 4V1

Number of measurements: 10

Hepatic segment:  8

Median velocity:   1.78 m/sec

IQR:

IQR/Median velocity ratio:

Corresponding Metavir fibrosis score:  F2 + some F3

Risk of fibrosis: Moderate

Limitations of exam: None

Please note that abnormal shear wave velocities may also be
identified in clinical settings other than with hepatic fibrosis,
such as: acute hepatitis, elevated right heart and central venous
pressures including use of beta blockers, Anarfo disease
(Amanda), infiltrative processes such as
mastocytosis/amyloidosis/infiltrative tumor, extrahepatic
cholestasis, in the post-prandial state, and liver transplantation.
Correlation with patient history, laboratory data, and clinical
condition recommended.
IMPRESSION: ULTRASOUND ABDOMEN: Potential mild nodular contour of the liver.

ULTRASOUND HEPATIC ELASTOGRAHY:

Median hepatic shear wave velocity is calculated at 1.78 m/sec.

Corresponding Metavir fibrosis score is F2 + some F3.

Risk of fibrosis is Moderate.

Follow-up: Additional testing appropriate

## 2020-06-10 DIAGNOSIS — N183 Chronic kidney disease, stage 3 unspecified: Secondary | ICD-10-CM | POA: Diagnosis not present

## 2020-06-10 DIAGNOSIS — I10 Essential (primary) hypertension: Secondary | ICD-10-CM | POA: Diagnosis not present

## 2020-07-11 ENCOUNTER — Telehealth: Payer: Self-pay | Admitting: Gastroenterology

## 2020-07-11 ENCOUNTER — Other Ambulatory Visit: Payer: Self-pay

## 2020-07-11 NOTE — Telephone Encounter (Signed)
atient needs to reschedule procedure, please call

## 2020-07-11 NOTE — Progress Notes (Signed)
Patient has been rescheduled for procedure. Updated instructions have been sent.

## 2020-07-11 NOTE — Telephone Encounter (Signed)
Patient has been rescheduled.

## 2020-07-12 ENCOUNTER — Other Ambulatory Visit: Payer: Medicare Other | Attending: Gastroenterology

## 2020-07-19 ENCOUNTER — Telehealth: Payer: Self-pay | Admitting: Gastroenterology

## 2020-07-19 ENCOUNTER — Other Ambulatory Visit: Payer: Self-pay

## 2020-07-19 ENCOUNTER — Other Ambulatory Visit: Payer: Medicare Other | Attending: Gastroenterology

## 2020-07-19 DIAGNOSIS — K7460 Unspecified cirrhosis of liver: Secondary | ICD-10-CM

## 2020-07-19 NOTE — Telephone Encounter (Signed)
Returned patients call to reschedule procedure. LVM to call office back.

## 2020-07-19 NOTE — Telephone Encounter (Signed)
Patient needs to reschedule procedure with Dr. Vicente Males from Thursday 1.20.22. Please call pt to resch. Pt had ov on 11.15.21.

## 2020-07-19 NOTE — Progress Notes (Signed)
Procedure has been rescheduled and updated instructions have been sent via my chart and mailed.

## 2020-07-21 ENCOUNTER — Ambulatory Visit: Admission: RE | Admit: 2020-07-21 | Payer: Medicare Other | Source: Home / Self Care | Admitting: Gastroenterology

## 2020-07-21 ENCOUNTER — Encounter: Admission: RE | Payer: Self-pay | Source: Home / Self Care

## 2020-07-21 SURGERY — ESOPHAGOGASTRODUODENOSCOPY (EGD) WITH PROPOFOL
Anesthesia: General

## 2020-07-25 DIAGNOSIS — H25093 Other age-related incipient cataract, bilateral: Secondary | ICD-10-CM | POA: Diagnosis not present

## 2020-07-25 DIAGNOSIS — H40013 Open angle with borderline findings, low risk, bilateral: Secondary | ICD-10-CM | POA: Diagnosis not present

## 2020-08-02 ENCOUNTER — Other Ambulatory Visit: Payer: Self-pay

## 2020-08-02 ENCOUNTER — Other Ambulatory Visit
Admission: RE | Admit: 2020-08-02 | Discharge: 2020-08-02 | Disposition: A | Discharge status: Home / Self Care | Payer: Medicare Other | Source: Ambulatory Visit | Attending: Gastroenterology | Admitting: Gastroenterology

## 2020-08-02 DIAGNOSIS — Z01812 Encounter for preprocedural laboratory examination: Secondary | ICD-10-CM | POA: Insufficient documentation

## 2020-08-02 DIAGNOSIS — Z20822 Contact with and (suspected) exposure to covid-19: Secondary | ICD-10-CM | POA: Insufficient documentation

## 2020-08-02 LAB — SARS CORONAVIRUS 2 (TAT 6-24 HRS): SARS Coronavirus 2: NEGATIVE

## 2020-08-03 ENCOUNTER — Other Ambulatory Visit (INDEPENDENT_AMBULATORY_CARE_PROVIDER_SITE_OTHER): Payer: Medicare Other

## 2020-08-03 DIAGNOSIS — N1831 Chronic kidney disease, stage 3a: Secondary | ICD-10-CM | POA: Diagnosis not present

## 2020-08-03 LAB — RENAL FUNCTION PANEL
Albumin: 4.1 g/dL (ref 3.5–5.2)
BUN: 23 mg/dL (ref 6–23)
CO2: 25 mEq/L (ref 19–32)
Calcium: 9.3 mg/dL (ref 8.4–10.5)
Chloride: 108 mEq/L (ref 96–112)
Creatinine, Ser: 1.1 mg/dL (ref 0.40–1.50)
GFR: 69.25 mL/min (ref >60.00)
Glucose, Bld: 97 mg/dL (ref 70–99)
Phosphorus: 2.6 mg/dL (ref 2.3–4.6)
Potassium: 4.1 mEq/L (ref 3.5–5.1)
Sodium: 139 mEq/L (ref 135–145)

## 2020-08-03 NOTE — Addendum Note (Signed)
Addended by: Ellamae Sia on: 08/03/2020 03:44 PM   Modules accepted: Orders

## 2020-08-04 ENCOUNTER — Encounter
Admission: RE | Disposition: A | Discharge status: Home / Self Care | Payer: Self-pay | Source: Home / Self Care | Attending: Gastroenterology

## 2020-08-04 ENCOUNTER — Ambulatory Visit
Admission: RE | Admit: 2020-08-04 | Discharge: 2020-08-04 | Disposition: A | Discharge status: Home / Self Care | Payer: Medicare Other | Attending: Gastroenterology | Admitting: Gastroenterology

## 2020-08-04 ENCOUNTER — Ambulatory Visit: Payer: Medicare Other | Admitting: Certified Registered"

## 2020-08-04 ENCOUNTER — Encounter: Payer: Self-pay | Admitting: Gastroenterology

## 2020-08-04 ENCOUNTER — Other Ambulatory Visit: Payer: Self-pay

## 2020-08-04 DIAGNOSIS — Z85528 Personal history of other malignant neoplasm of kidney: Secondary | ICD-10-CM | POA: Diagnosis not present

## 2020-08-04 DIAGNOSIS — Z79899 Other long term (current) drug therapy: Secondary | ICD-10-CM | POA: Insufficient documentation

## 2020-08-04 DIAGNOSIS — Z13810 Encounter for screening for upper gastrointestinal disorder: Secondary | ICD-10-CM | POA: Insufficient documentation

## 2020-08-04 DIAGNOSIS — K449 Diaphragmatic hernia without obstruction or gangrene: Secondary | ICD-10-CM | POA: Insufficient documentation

## 2020-08-04 DIAGNOSIS — K7460 Unspecified cirrhosis of liver: Secondary | ICD-10-CM

## 2020-08-04 DIAGNOSIS — Z888 Allergy status to other drugs, medicaments and biological substances status: Secondary | ICD-10-CM | POA: Diagnosis not present

## 2020-08-04 DIAGNOSIS — Z905 Acquired absence of kidney: Secondary | ICD-10-CM | POA: Insufficient documentation

## 2020-08-04 DIAGNOSIS — Z7982 Long term (current) use of aspirin: Secondary | ICD-10-CM | POA: Diagnosis not present

## 2020-08-04 HISTORY — PX: ESOPHAGOGASTRODUODENOSCOPY (EGD) WITH PROPOFOL: SHX5813

## 2020-08-04 LAB — MICROALBUMIN / CREATININE URINE RATIO
Creatinine,U: 224.3 mg/dL
Microalb Creat Ratio: 1 mg/g (ref 0.0–30.0)
Microalb, Ur: 2.3 mg/dL — ABNORMAL HIGH (ref 0.0–1.9)

## 2020-08-04 SURGERY — ESOPHAGOGASTRODUODENOSCOPY (EGD) WITH PROPOFOL
Anesthesia: General

## 2020-08-04 MED ORDER — PROPOFOL 10 MG/ML IV BOLUS
INTRAVENOUS | Status: DC | PRN
  Administered 2020-08-04: 30 mg via INTRAVENOUS
  Administered 2020-08-04: 70 mg via INTRAVENOUS

## 2020-08-04 MED ORDER — GLYCOPYRROLATE 0.2 MG/ML IJ SOLN
INTRAMUSCULAR | Status: DC | PRN
  Administered 2020-08-04: .2 mg via INTRAVENOUS

## 2020-08-04 MED ORDER — LIDOCAINE 2% (20 MG/ML) 5 ML SYRINGE
INTRAMUSCULAR | Status: DC | PRN
  Administered 2020-08-04: 25 mg via INTRAVENOUS

## 2020-08-04 MED ORDER — PROPOFOL 500 MG/50ML IV EMUL
INTRAVENOUS | Status: DC | PRN
  Administered 2020-08-04: 120 ug/kg/min via INTRAVENOUS

## 2020-08-04 MED ORDER — SODIUM CHLORIDE 0.9 % IV SOLN: INTRAVENOUS | Status: DC

## 2020-08-04 NOTE — H&P (Signed)
[name], MD [address] [place], [place], [place], [address] [address], [place], [place], [place], [address] Phone: [phone]  Fax: [phone]  Primary Care Physician:  [name], MD   Pre-Procedure History & Physical: HPI:  Aaron Cole is a 70 y.o. male is here for an endoscopy    Past Medical History:  Diagnosis Date  . CAD (coronary artery disease) 2013   per CT scan  - brings CD of CT, no report - will need [facility] records of CT scan report  . Cirrhosis of liver ([place]) 01/10/2019   By [place] 03/2020  . CKD (chronic kidney disease) stage 3, GFR 30-59 ml/min ([place]) 2015   iatrogenic (decreased nephron mass) established with Dr. Alfonse Alpers [facility])  . Diverticulosis    by CT scan  . Hepatitis C virus infection without hepatic coma 03/02/2019  . History of hepatitis B virus infection    spontaneously cleared  . HTN (hypertension)   . Renal cell carcinoma ([place]) 2013   clear cell s/p R nephrectomy  . S/p nephrectomy 2013   [facility]  . Sinus headache    recurrent    Past Surgical History:  Procedure Laterality Date  . BENTALL PROCEDURE N/A 07/05/2015   Procedure: BENTALL PROCEDURE UTILIZING A 28 X 10 MM WOVEN DOUBLE VELOUR VASCULAR GRAFT AND A 28MM GELWEAVE WOVEN VASCULAR GRAFT. 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 COLONOSCOPY  11/2008   WNL records in chart [facility])  . COLONOSCOPY WITH PROPOFOL N/A 02/24/2019   TA, HP, rpt 5 yrs ([place])  . CORONARY ANGIOPLASTY    . HYDROCELE EXCISION Right 02/05/2020   Procedure: HYDROCELECTOMY ADULT;  Surgeon: Billey Co, MD;  Location: [facility] ORS;  Service: Urology;  Laterality: Right;  . NASAL SINUS SURGERY  1978   [facility]  . NEPHRECTOMY Right 12/2011   [facility], R for RCC  . rectal fistula repair  1980s  . TEE WITHOUT CARDIOVERSION N/A 07/05/2015   Procedure:  TRANSESOPHAGEAL ECHOCARDIOGRAM (TEE);  Surgeon: [name], MD;  Location: [facility];  Service: Open Heart Surgery;  Laterality: N/A;    Prior to Admission medications   Medication Sig Start Date End Date Taking? Authorizing Provider  amLODipine (NORVASC) 2.5 MG tablet Take 1 tablet (2.5 mg total) by mouth daily. 02/22/20  Yes [name], MD  aspirin EC 81 MG tablet Take 81 mg by mouth every evening. Swallow whole.   Yes [provider]  cholecalciferol (VITAMIN D) 25 MCG (1000 UNIT) tablet Take 1,000 Units by mouth daily.   Yes [provider]  fluticasone (FLONASE) 50 MCG/ACT nasal spray INHALE 2 SPRAYS INTO EACH NOSTRIL EVERY DAY. Patient taking differently: Place 2 sprays into both nostrils daily as needed (allergies.). 06/27/16  Yes [name], MD  Multiple Vitamin (MULTIVITAMIN WITH MINERALS) TABS tablet Take 1 tablet by mouth daily.   Yes [provider]  Omega-3 Fatty Acids (FISH OIL) 1000 MG CAPS Take 1 capsule (1,000 mg total) by mouth daily. 10/25/15  Yes [name], MD  sildenafil (REVATIO) 20 MG tablet Take 3-5 tablets as needed before sexual activity 03/23/20  Yes Billey Co, MD  telmisartan (MICARDIS) 80 MG tablet Take 0.5 tablets (40 mg total) by mouth 2 (two) times daily. TAKE ONE-HALF (1/2) OF A TABLET BY MOUTH TWICE  DAILY. 04/07/20  Yes [name], NP    Allergies as of 07/19/2020 - Review Complete 05/16/2020  Allergen Reaction Noted  . Carvedilol Other (See Comments) 12/19/2016    Family History  Problem Relation Age of Onset  . Cancer Paternal Grandmother        throat  . Heart disease Mother   . Hypertension Mother   . Heart disease Father   . Diabetes Father   . Colon polyps Father   . Crohn's disease Father   . Coronary artery disease Maternal Uncle        MI  . Cancer Paternal Aunt        breast   . Cancer Paternal Aunt        brain  . Colon polyps Brother   . CAD Maternal Grandfather   . Stroke Neg Hx      Social History   Socioeconomic History  . Marital status: Single    Spouse name: Not on file  . Number of children: Not on file  . Years of education: Not on file  . Highest education level: Not on file  Occupational History  . Not on file  Tobacco Use  . Smoking status: Never Smoker  . Smokeless tobacco: Never Used  Vaping Use  . Vaping Use: Never used  Substance and Sexual Activity  . Alcohol use: Not Currently  . Drug use: Never  . Sexual activity: Yes  Other Topics Concern  . Not on file  Social History Narrative   Caffeine: occasional   Lives alone, 1 cat   Occupation: farming business   Edu: BS   Activity: bikes 3-4x/wk about 1 hour   Diet: good water, fruits/vegetables daily, steak 1x/wk, fish 2x/wk   Social Determinants of Health   Financial Resource Strain: Low Risk   . Difficulty of Paying Living Expenses: Not hard at all  Food Insecurity: No Food Insecurity  . Worried About Charity fundraiser in the Last Year: Never true  . Ran Out of Food in the Last Year: Never true  Transportation Needs: No Transportation Needs  . Lack of Transportation (Medical): No  . Lack of Transportation (Non-Medical): No  Physical Activity: Insufficiently Active  . Days of Exercise per Week: 4 days  . Minutes of Exercise per Session: 20 min  Stress: No Stress Concern Present  . Feeling of Stress : Not at all  Social Connections: Not on file  Intimate Partner Violence: Not At Risk  . Fear of Current or Ex-Partner: No  . Emotionally Abused: No  . Physically Abused: No  . Sexually Abused: No    Review of Systems: See HPI, otherwise negative ROS  Physical Exam: BP (!) 143/99   Pulse 72   Temp (!) 96.8 F (36 C) (Temporal)   Resp 18   Ht 5\' 11"  (1.803 m)   Wt 98.4 kg   SpO2 97%   BMI 30.27 kg/m  General:   Alert,  pleasant and cooperative in NAD Head:  Normocephalic and atraumatic. Neck:  Supple; no masses or thyromegaly. Lungs:  Clear throughout to  auscultation, normal respiratory effort.    Heart:  +S1, +S2, Regular rate and rhythm, No edema. Abdomen:  Soft, nontender and nondistended. Normal bowel sounds, without guarding, and without rebound.   Neurologic:  Alert and  oriented x4;  grossly normal neurologically.  Impression/Plan: Aaron Cole is here for an endoscopy  to be performed for  evaluation of esophageal varices  Risks, benefits, limitations, and alternatives regarding endoscopy have been reviewed with the patient.  Questions have been answered.  All parties agreeable.   [name], MD  08/04/2020, 7:47 AM

## 2020-08-04 NOTE — Transfer of Care (Signed)
Immediate Anesthesia Transfer of Care Note  Patient: Aaron Cole  Procedure(s) Performed: ESOPHAGOGASTRODUODENOSCOPY (EGD) WITH PROPOFOL (N/A )  Patient Location: Endoscopy Unit  Anesthesia Type:General  Level of Consciousness: sedated  Airway & Oxygen Therapy: Patient Spontanous Breathing  Post-op Assessment: Report given to RN and Post -op Vital signs reviewed and stable  Post vital signs: Reviewed  Last Vitals:  Vitals Value Taken Time  BP    Temp    Pulse    Resp    SpO2      Last Pain:  Vitals:   08/04/20 0702  TempSrc: Temporal  PainSc: 0-No pain         Complications: No complications documented.

## 2020-08-04 NOTE — Anesthesia Preprocedure Evaluation (Signed)
Anesthesia Evaluation  Patient identified by MRN, date of birth, ID band Patient awake    Reviewed: Allergy & Precautions, H&P , NPO status , Patient's Chart, lab work & pertinent test results, reviewed documented beta blocker date and time   History of Anesthesia Complications Negative for: history of anesthetic complications  Airway Mallampati: III  TM Distance: >3 FB Neck ROM: full    Dental  (+) Dental Advidsory Given, Teeth Intact Permanent bridge x2:   Pulmonary neg pulmonary ROS,    Pulmonary exam normal        Cardiovascular Exercise Tolerance: Good hypertension, (-) angina+ CAD  (-) Past MI, (-) Cardiac Stents and (-) CABG Normal cardiovascular exam(-) dysrhythmias + Valvular Problems/Murmurs (s/p AVR and beginning portion of aorta)      Neuro/Psych negative neurological ROS  negative psych ROS   GI/Hepatic negative GI ROS, (+) Hepatitis -, C  Endo/Other  negative endocrine ROS  Renal/GU CRFRenal disease  negative genitourinary   Musculoskeletal   Abdominal   Peds  Hematology negative hematology ROS (+)   Anesthesia Other Findings Past Medical History: 2013: CAD (coronary artery disease)     Comment:  per CT scan  - brings CD of CT, no report - will need               [facility] of [facility] scan report 2015: CKD (chronic kidney disease) stage 3, GFR 30-59 ml/min ([facility])     Comment:  iatrogenic (decreased nephron mass) established with Dr.              Alfonse Alpers [facility]) No date: Diverticulosis     Comment:  by CT scan No date: HTN (hypertension) 2013: Renal cell carcinoma ([place])     Comment:  clear cell s/p R nephrectomy 2013: S/p nephrectomy     Comment:  [facility] No date: Sinus headache     Comment:  recurrent   Reproductive/Obstetrics negative OB ROS                             Anesthesia Physical  Anesthesia Plan  ASA: II  Anesthesia Plan: General   Post-op Pain Management:     Induction: Intravenous  PONV Risk Score and Plan: 2 and Propofol infusion and TIVA  Airway Management Planned: Natural Airway and Nasal Cannula  Additional Equipment:   Intra-op Plan:   Post-operative Plan:   Informed Consent: I have reviewed the patients History and Physical, chart, labs and discussed the procedure including the risks, benefits and alternatives for the proposed anesthesia with the patient or authorized representative who has indicated his/her understanding and acceptance.     Dental Advisory Given  Plan Discussed with: Anesthesiologist, CRNA and Surgeon  Anesthesia Plan Comments:         Anesthesia Quick Evaluation

## 2020-08-04 NOTE — Anesthesia Postprocedure Evaluation (Signed)
Anesthesia Post Note  Patient: JEB SCHLOEMER  Procedure(s) Performed: ESOPHAGOGASTRODUODENOSCOPY (EGD) WITH PROPOFOL (N/A )  Patient location during evaluation: Endoscopy Anesthesia Type: General Level of consciousness: awake and alert Pain management: pain level controlled Vital Signs Assessment: post-procedure vital signs reviewed and stable Respiratory status: spontaneous breathing, nonlabored ventilation, respiratory function stable and patient connected to nasal cannula oxygen Cardiovascular status: blood pressure returned to baseline and stable Postop Assessment: no apparent nausea or vomiting Anesthetic complications: no   No complications documented.   Last Vitals:  Vitals:   08/04/20 0800 08/04/20 0830  BP: 102/69 (!) 145/95  Pulse: 67   Resp: 18   Temp: (!) 36.2 C   SpO2: 95%     Last Pain:  Vitals:   08/04/20 0830  TempSrc:   PainSc: 0-No pain                 Martha Clan

## 2020-08-04 NOTE — Op Note (Signed)
[facility] Gastroenterology Patient Name: Aaron Cole Procedure Date: 08/04/2020 7:21 AM MRN: [id number] Account #: [id number] Date of Birth: 02/17/1951 Admit Type: Outpatient Age: 70 Room: [facility] ENDO ROOM 3 Gender: Male Note Status: Finalized Procedure:             Upper GI endoscopy Indications:           Cirrhosis rule out esophageal varices Providers:             [name] MD, MD Referring MD:          [name] (Referring MD) Medicines:             Monitored Anesthesia Care Complications:         No immediate complications. Procedure:             Pre-Anesthesia Assessment:                        - Prior to the procedure, a History and Physical was                         performed, and patient medications, allergies and                         sensitivities were reviewed. The patient's tolerance                         of previous anesthesia was reviewed.                        - The risks and benefits of the procedure and the                         sedation options and risks were discussed with the                         patient. All questions were answered and informed                         consent was obtained.                        - ASA Grade Assessment: III - A patient with severe                         systemic disease.                        After obtaining informed consent, the endoscope was                         passed under direct vision. Throughout the procedure,                         the patient's blood pressure, pulse, and oxygen                         saturations were monitored continuously. The Endoscope                         was introduced through the mouth,  and advanced to the                         third part of duodenum. The upper GI endoscopy was                         accomplished with ease. The patient tolerated the                         procedure well. Findings:      The examined duodenum was normal.      A  medium-sized hiatal hernia was present.      The cardia and gastric fundus were normal on retroflexion.      The esophagus was normal. Impression:            - Normal examined duodenum.                        - Medium-sized hiatal hernia.                        - Normal esophagus.                        - No specimens collected. Recommendation:        - Discharge patient to home (with escort).                        - Resume previous diet.                        - Continue present medications.                        - Repeat upper endoscopy in 3 years for surveillance. Procedure Code(s):     --- Professional ---                        [phone], Esophagogastroduodenoscopy, flexible,                         transoral; diagnostic, including collection of                         specimen(s) by brushing or washing, when performed                         (separate procedure) Diagnosis Code(s):     --- Professional ---                        K44.9, Diaphragmatic hernia without obstruction or                         gangrene                        K74.60, Unspecified cirrhosis of liver CPT copyright 2019 American Medical Association. All rights reserved. The codes documented in this report are preliminary and upon coder review may  be revised to meet current compliance requirements. [name], MD [name] MD, MD 08/04/2020 7:57:32 AM This report has been signed electronically. Number of Addenda: 0 Note  Initiated On: 08/04/2020 7:21 AM Estimated Blood Loss:  Estimated blood loss: none.      [facility]

## 2020-08-05 ENCOUNTER — Encounter: Payer: Self-pay | Admitting: Gastroenterology

## 2020-09-06 ENCOUNTER — Other Ambulatory Visit: Payer: Self-pay

## 2020-09-06 DIAGNOSIS — K7460 Unspecified cirrhosis of liver: Secondary | ICD-10-CM

## 2020-09-15 ENCOUNTER — Ambulatory Visit
Admission: RE | Admit: 2020-09-15 | Discharge: 2020-09-15 | Disposition: A | Discharge status: Home / Self Care | Payer: Medicare Other | Source: Ambulatory Visit | Attending: Gastroenterology | Admitting: Gastroenterology

## 2020-09-15 ENCOUNTER — Other Ambulatory Visit: Payer: Self-pay

## 2020-09-15 DIAGNOSIS — K7460 Unspecified cirrhosis of liver: Secondary | ICD-10-CM

## 2020-09-15 DIAGNOSIS — K8020 Calculus of gallbladder without cholecystitis without obstruction: Secondary | ICD-10-CM | POA: Diagnosis not present

## 2020-09-19 ENCOUNTER — Encounter: Payer: Self-pay | Admitting: Gastroenterology

## 2020-09-19 ENCOUNTER — Telehealth: Payer: Self-pay | Admitting: Gastroenterology

## 2020-09-19 NOTE — Telephone Encounter (Signed)
Patient LVM aksing for call back to discuss [place] results done on 09/15/20.    Please call to advise

## 2020-09-20 NOTE — Telephone Encounter (Signed)
Stable features of liver cirrhosis seen no new changes.  Repeat ultrasound in 6 months

## 2020-09-21 NOTE — Telephone Encounter (Signed)
Pt has been notified of results and Dr. Anna's recommendations. 

## 2020-11-18 ENCOUNTER — Telehealth: Payer: Self-pay | Admitting: Interventional Cardiology

## 2020-11-18 NOTE — Telephone Encounter (Signed)
Aaron Cole is calling wanting to know the blood flow percentage on his last Echo performed at our office. Based on our records this was last performed in 2020. Please advise.

## 2020-11-18 NOTE — Telephone Encounter (Signed)
Spoke to patient, questions regarding his echocardiogram in 2020.  Patients wants the percentage of his blood flow, report showed 50-55% and Dr. Tamala Julian review stated "nothing worrisome" .    Patient scheduled to be seen in the office in August.  Patient appreciative of return call.

## 2021-02-03 NOTE — Progress Notes (Signed)
Cardiology Office Note:    Date:  02/06/2021   ID:  Aaron Cole, DOB 08-Nov-1950, MRN [id number]  PCP:  [name], MD  Cardiologist:  Sinclair Grooms, MD   Referring MD: [name], MD   Chief Complaint  Patient presents with   Follow-up    Aortic valve disease     History of Present Illness:    Aaron Cole is a 70 y.o. male with a hx of aortic valve replacement and repair of ascending aortic aneurysm with a Bentall procedure January 2017. Postoperative atrial fibrillation has not recurred. History of nephrectomy for renal cell carcinoma with resultant elevated creatinine levels and "stage III, CKD" and essential hypertension.  Gets dizzy and weak when he takes amlodipine.  He last used it 36 hours ago.  He takes it at night because it helps him to sleep.  He can go days without taking it.  When he takes that he does not feel well.  He denies chest pain, orthopnea, palpitations, syncope, edema, and angina.  Past Medical History:  Diagnosis Date   CAD (coronary artery disease) 2013   per CT scan  - brings CD of CT, no report - will need [facility] records of CT scan report   Cirrhosis of liver ([place]) 01/10/2019   By [place] 03/2020   CKD (chronic kidney disease) stage 3, GFR 30-59 ml/min ([place]) 2015   iatrogenic (decreased nephron mass) established with Dr[facility])   Diverticulosis    by CT scan   Hepatitis C virus infection without hepatic coma 03/02/2019   History of hepatitis B virus infection    spontaneously cleared   HTN (hypertension)    Renal cell carcinoma ([place]) 2013   clear cell s/p R nephrectomy   S/p nephrectomy 2013   [facility]   Sinus headache    recurrent    Past Surgical History:  Procedure Laterality Date   BENTALL PROCEDURE N/A 07/05/2015   Procedure: BENTALL PROCEDURE UTILIZING A 28 X 10 MM WOVEN DOUBLE VELOUR VASCULAR GRAFT AND A 28MM GELWEAVE WOVEN VASCULAR GRAFT. 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.;   Surgeon: [name], MD;  Location: [facility] OR;  Service: Open Heart Surgery;  Laterality: N/A;   COLONOSCOPY  11/2008   WNL records in chart [facility])   COLONOSCOPY WITH PROPOFOL N/A 02/24/2019   TA, HP, rpt 5 yrs ([facility])   CORONARY ANGIOPLASTY     ESOPHAGOGASTRODUODENOSCOPY (EGD) WITH PROPOFOL N/A 08/04/2020   Procedure: ESOPHAGOGASTRODUODENOSCOPY (EGD) WITH PROPOFOL;  Surgeon: [name], MD;  Location: [facility] ENDOSCOPY;  Service: Gastroenterology;  Laterality: N/A;   HYDROCELE EXCISION Right 02/05/2020   Procedure: HYDROCELECTOMY ADULT;  Surgeon: Billey Co, MD;  Location: [facility] ORS;  Service: Urology;  Laterality: Right;   NASAL SINUS SURGERY  1978   sinus surgery done at [place] Right 12/2011   [facility], R for RCC   rectal fistula repair  1980s   TEE WITHOUT CARDIOVERSION N/A 07/05/2015   Procedure: TRANSESOPHAGEAL ECHOCARDIOGRAM (TEE);  Surgeon: [name], MD;  Location: [facility];  Service: Open Heart Surgery;  Laterality: N/A;    Current Medications: Current Meds  Medication Sig   amLODipine (NORVASC) 2.5 MG tablet Take 1 tablet (2.5 mg total) by mouth daily.   aspirin EC 81 MG tablet Take 81 mg by mouth every evening. Swallow whole.   cholecalciferol (VITAMIN D) 25 MCG (1000 UNIT) tablet Take 1,000 Units by mouth daily.  fluticasone (FLONASE) 50 MCG/ACT nasal spray INHALE 2 SPRAYS INTO EACH NOSTRIL EVERY DAY.   Multiple Vitamin (MULTIVITAMIN WITH MINERALS) TABS tablet Take 1 tablet by mouth daily.   Omega-3 Fatty Acids (FISH OIL) 1000 MG CAPS Take 1 capsule (1,000 mg total) by mouth daily.   sildenafil (REVATIO) 20 MG tablet Take 3-5 tablets as needed before sexual activity   telmisartan (MICARDIS) 80 MG tablet Take 0.5 tablets (40 mg total) by mouth 2 (two) times daily. TAKE ONE-HALF (1/2) OF A TABLET BY MOUTH TWICE DAILY.     Allergies:   Carvedilol   Social History   Socioeconomic History   Marital status: Single    Spouse name: Not on file   Number of children:  Not on file   Years of education: Not on file   Highest education level: Not on file  Occupational History   Not on file  Tobacco Use   Smoking status: Never   Smokeless tobacco: Never  Vaping Use   Vaping Use: Never used  Substance and Sexual Activity   Alcohol use: Not Currently   Drug use: Never   Sexual activity: Yes  Other Topics Concern   Not on file  Social History Narrative   Caffeine: occasional   Lives alone, 1 cat   Occupation: farming business   Edu: BS   Activity: bikes 3-4x/wk about 1 hour   Diet: good water, fruits/vegetables daily, steak 1x/wk, fish 2x/wk   Social Determinants of Radio broadcast assistant Strain: Not on file  Food Insecurity: Not on file  Transportation Needs: Not on file  Physical Activity: Not on file  Stress: Not on file  Social Connections: Not on file     Family History: The patient's family history includes CAD in his maternal grandfather; Cancer in his paternal aunt, paternal aunt, and paternal grandmother; Colon polyps in his brother and father; Coronary artery disease in his maternal uncle; Crohn's disease in his father; Diabetes in his father; Heart disease in his father and mother; Hypertension in his mother. There is no history of Stroke.  ROS:   Please see the history of present illness.    Feels better now than he has in years.  Does heavy physical labor.  No restraints.  All other systems reviewed and are negative.  EKGs/Labs/Other Studies Reviewed:    The following studies were reviewed today: Has not had an echocardiogram since his Bentall procedure in 2017.  EKG:  EKG normal sinus rhythm, left bundle branch block, left axis deviation, sinus rhythm.  Compared to May 2021, no changes occurred.  Recent Labs: 05/16/2020: ALT 14; Hemoglobin 13.5; Platelets 183 08/03/2020: BUN 23; Creatinine, Ser 1.10; Potassium 4.1; Sodium 139  Recent Lipid Panel    Component Value Date/Time   CHOL 184 01/15/2020 0732   TRIG 170.0  (H) 01/15/2020 0732   TRIG 131 06/21/2011 0000   HDL 26.80 (L) 01/15/2020 0732   CHOLHDL 7 01/15/2020 0732   VLDL 34.0 01/15/2020 0732   LDLCALC 123 (H) 01/15/2020 0732   LDLDIRECT 118 06/21/2011 0000    Physical Exam:    VS:  BP 122/72   Pulse 62   Ht '5\' 11"'$  (1.803 m)   Wt 223 lb 6.4 oz (101.3 kg)   SpO2 98%   BMI 31.16 kg/m     Wt Readings from Last 3 Encounters:  02/06/21 223 lb 6.4 oz (101.3 kg)  08/04/20 217 lb (98.4 kg)  05/16/20 221 lb (100.2 kg)  GEN: Overweight. No acute distress HEENT: Normal NECK: No JVD. LYMPHATICS: No lymphadenopathy CARDIAC: 1/6 systolic murmur. RRR no gallop, or edema. VASCULAR:  Normal Pulses. No bruits. RESPIRATORY:  Clear to auscultation without rales, wheezing or rhonchi  ABDOMEN: Soft, non-tender, non-distended, No pulsatile mass, MUSCULOSKELETAL: No deformity  SKIN: Warm and dry NEUROLOGIC:  Alert and oriented x 3 PSYCHIATRIC:  Normal affect   ASSESSMENT:    1. S/P aortic valve replacement   2. Paroxysmal atrial fibrillation (HCC)   3. Essential hypertension   4. Stage 3 chronic kidney disease, unspecified whether stage 3a or 3b CKD ([place])   5. Chronic combined systolic and diastolic heart failure (HCC)    PLAN:    In order of problems listed above:  2D Doppler echocardiogram to follow-up. No complaints to suggest atrial fibrillation. Excellent blood pressure control on Micardis 40 mg/day.  States he feels lightheaded on amlodipine and does not take it regularly.  Discontinue amlodipine. Last creatinine level was 1.1 in February 2022. 2D Doppler echocardiogram will be done.  Cholesterol is elevated at.  He had no evidence of obstructive disease or plaque at heart catheterization 2017.  Should have lipids followed and managed. Overall education and awareness concerning primary/secondary risk prevention was discussed in detail: LDL less than 70, hemoglobin A1c less than 7, blood pressure target less than 130/80 mmHg, >150  minutes of moderate aerobic activity per week, avoidance of smoking, weight control (via diet and exercise), and continued surveillance/management of/for obstructive sleep apnea.    Medication Adjustments/Labs and Tests Ordered: Current medicines are reviewed at length with the patient today.  Concerns regarding medicines are outlined above.  Orders Placed This Encounter  Procedures   EKG 12-Lead   No orders of the defined types were placed in this encounter.   There are no Patient Instructions on file for this visit.   Signed, Sinclair Grooms, MD  02/06/2021 8:06 AM    [place]

## 2021-02-06 ENCOUNTER — Other Ambulatory Visit: Payer: Self-pay

## 2021-02-06 ENCOUNTER — Encounter: Payer: Self-pay | Admitting: Interventional Cardiology

## 2021-02-06 ENCOUNTER — Ambulatory Visit (INDEPENDENT_AMBULATORY_CARE_PROVIDER_SITE_OTHER): Payer: Medicare Other | Admitting: Interventional Cardiology

## 2021-02-06 VITALS — BP 122/72 | HR 62 | Ht 71.0 in | Wt 223.4 lb

## 2021-02-06 DIAGNOSIS — I10 Essential (primary) hypertension: Secondary | ICD-10-CM | POA: Diagnosis not present

## 2021-02-06 DIAGNOSIS — Z952 Presence of prosthetic heart valve: Secondary | ICD-10-CM

## 2021-02-06 DIAGNOSIS — N183 Chronic kidney disease, stage 3 unspecified: Secondary | ICD-10-CM

## 2021-02-06 DIAGNOSIS — I5042 Chronic combined systolic (congestive) and diastolic (congestive) heart failure: Secondary | ICD-10-CM

## 2021-02-06 DIAGNOSIS — I480 Paroxysmal atrial fibrillation: Secondary | ICD-10-CM

## 2021-02-06 NOTE — Patient Instructions (Addendum)
Medication Instructions:  1) DISCONTINUE Amlodipine  *If you need a refill on your cardiac medications before your next appointment, please call your pharmacy*   Lab Work: None If you have labs (blood work) drawn today and your tests are completely normal, you will receive your results only by: [place] (if you have MyChart) OR A paper copy in the mail If you have any lab test that is abnormal or we need to change your treatment, we will call you to review the results.   Testing/Procedures: Your physician has requested that you have an echocardiogram. Echocardiography is a painless test that uses sound waves to create images of your heart. It provides your doctor with information about the size and shape of your heart and how well your heart's chambers and valves are working. This procedure takes approximately one hour. There are no restrictions for this procedure.   Follow-Up: At [facility], you and your health needs are our priority.  As part of our continuing mission to provide you with exceptional heart care, we have created designated Provider Care Teams.  These Care Teams include your primary Cardiologist (physician) and Advanced Practice Providers (APPs -  Physician Assistants and Nurse Practitioners) who all work together to provide you with the care you need, when you need it.  We recommend signing up for the patient portal called "MyChart".  Sign up information is provided on this After Visit Summary.  MyChart is used to connect with patients for Virtual Visits (Telemedicine).  Patients are able to view lab/test results, encounter notes, upcoming appointments, etc.  Non-urgent messages can be sent to your provider as well.   To learn more about what you can do with MyChart, go to [email or web address].    Your next appointment:   1 year(s)  The format for your next appointment:   In Person  Provider:   You may see Sinclair Grooms, MD or one of the following  Advanced Practice Providers on your designated Care Team:   Cecilie Kicks, NP    Other Instructions

## 2021-02-22 ENCOUNTER — Ambulatory Visit (HOSPITAL_COMMUNITY): Payer: BLUE CROSS/BLUE SHIELD

## 2021-03-02 ENCOUNTER — Other Ambulatory Visit: Payer: Self-pay

## 2021-03-02 ENCOUNTER — Ambulatory Visit (HOSPITAL_COMMUNITY): Payer: Medicare Other | Attending: Cardiology

## 2021-03-02 DIAGNOSIS — Z952 Presence of prosthetic heart valve: Secondary | ICD-10-CM | POA: Insufficient documentation

## 2021-03-02 DIAGNOSIS — I480 Paroxysmal atrial fibrillation: Secondary | ICD-10-CM | POA: Insufficient documentation

## 2021-03-03 ENCOUNTER — Telehealth: Payer: Self-pay | Admitting: Interventional Cardiology

## 2021-03-03 LAB — ECHOCARDIOGRAM COMPLETE
Area-P 1/2: 2.01 cm2
S' Lateral: 3.85 cm

## 2021-03-03 NOTE — Telephone Encounter (Signed)
Spoke with pt and let him know that I am waiting on Dr. Tamala Julian to review echo and give official results.  Did discuss the pumping function of the heart and atrial dilation.  Advised I will be in touch once Dr. Tamala Julian reviews results.  Pt appreciative for call.

## 2021-03-03 NOTE — Telephone Encounter (Signed)
Patient calling for echo results. He says he say them on his mychart and would like someone to help explain them.

## 2021-05-12 ENCOUNTER — Other Ambulatory Visit: Payer: Self-pay | Admitting: *Deleted

## 2021-05-12 MED ORDER — TELMISARTAN 80 MG PO TABS: 40.0000 mg | ORAL_TABLET | Freq: Two times a day (BID) | ORAL | 3 refills | Status: DC

## 2021-05-24 DIAGNOSIS — Z20822 Contact with and (suspected) exposure to covid-19: Secondary | ICD-10-CM | POA: Diagnosis not present

## 2021-06-02 DIAGNOSIS — I10 Essential (primary) hypertension: Secondary | ICD-10-CM | POA: Diagnosis not present

## 2021-06-02 DIAGNOSIS — N183 Chronic kidney disease, stage 3 unspecified: Secondary | ICD-10-CM | POA: Diagnosis not present

## 2021-06-30 ENCOUNTER — Other Ambulatory Visit: Payer: Self-pay | Admitting: Interventional Cardiology

## 2021-07-19 DIAGNOSIS — N182 Chronic kidney disease, stage 2 (mild): Secondary | ICD-10-CM | POA: Diagnosis not present

## 2021-07-19 DIAGNOSIS — N200 Calculus of kidney: Secondary | ICD-10-CM | POA: Diagnosis not present

## 2021-07-19 DIAGNOSIS — I150 Renovascular hypertension: Secondary | ICD-10-CM | POA: Diagnosis not present

## 2021-07-19 DIAGNOSIS — C649 Malignant neoplasm of unspecified kidney, except renal pelvis: Secondary | ICD-10-CM | POA: Diagnosis not present

## 2021-07-19 DIAGNOSIS — N179 Acute kidney failure, unspecified: Secondary | ICD-10-CM | POA: Diagnosis not present

## 2021-07-19 DIAGNOSIS — N049 Nephrotic syndrome with unspecified morphologic changes: Secondary | ICD-10-CM | POA: Diagnosis not present

## 2021-07-26 DIAGNOSIS — H25093 Other age-related incipient cataract, bilateral: Secondary | ICD-10-CM | POA: Diagnosis not present

## 2022-03-22 ENCOUNTER — Other Ambulatory Visit: Payer: Self-pay | Admitting: Family Medicine

## 2022-03-22 DIAGNOSIS — K7460 Unspecified cirrhosis of liver: Secondary | ICD-10-CM

## 2022-03-22 DIAGNOSIS — E782 Mixed hyperlipidemia: Secondary | ICD-10-CM

## 2022-03-22 DIAGNOSIS — N1831 Chronic kidney disease, stage 3a: Secondary | ICD-10-CM

## 2022-03-28 ENCOUNTER — Other Ambulatory Visit (INDEPENDENT_AMBULATORY_CARE_PROVIDER_SITE_OTHER): Payer: Medicare Other

## 2022-03-28 DIAGNOSIS — N1831 Chronic kidney disease, stage 3a: Secondary | ICD-10-CM

## 2022-03-28 DIAGNOSIS — E782 Mixed hyperlipidemia: Secondary | ICD-10-CM | POA: Diagnosis not present

## 2022-03-28 DIAGNOSIS — K7460 Unspecified cirrhosis of liver: Secondary | ICD-10-CM | POA: Diagnosis not present

## 2022-03-28 LAB — CBC WITH DIFFERENTIAL/PLATELET
Basophils Absolute: 0 10*3/uL (ref 0.0–0.1)
Basophils Relative: 0.5 % (ref 0.0–3.0)
Eosinophils Absolute: 0.1 10*3/uL (ref 0.0–0.7)
Eosinophils Relative: 1.9 % (ref 0.0–5.0)
HCT: 43.7 % (ref 39.0–52.0)
Hemoglobin: 14.9 g/dL (ref 13.0–17.0)
Lymphocytes Relative: 34.6 % (ref 12.0–46.0)
Lymphs Abs: 2.5 10*3/uL (ref 0.7–4.0)
MCHC: 34.1 g/dL (ref 30.0–36.0)
MCV: 86.3 fl (ref 78.0–100.0)
Monocytes Absolute: 0.6 10*3/uL (ref 0.1–1.0)
Monocytes Relative: 8.2 % (ref 3.0–12.0)
Neutro Abs: 4 10*3/uL (ref 1.4–7.7)
Neutrophils Relative %: 54.8 % (ref 43.0–77.0)
Platelets: 157 10*3/uL (ref 150.0–400.0)
RBC: 5.06 Mil/uL (ref 4.22–5.81)
RDW: 13.6 % (ref 11.5–15.5)
WBC: 7.3 10*3/uL (ref 4.0–10.5)

## 2022-03-28 LAB — MICROALBUMIN / CREATININE URINE RATIO
Creatinine,U: 103.4 mg/dL
Microalb Creat Ratio: 1.3 mg/g (ref 0.0–30.0)
Microalb, Ur: 1.4 mg/dL (ref 0.0–1.9)

## 2022-03-28 LAB — COMPREHENSIVE METABOLIC PANEL
ALT: 12 U/L (ref 0–53)
AST: 18 U/L (ref 0–37)
Albumin: 4.2 g/dL (ref 3.5–5.2)
Alkaline Phosphatase: 59 U/L (ref 39–117)
BUN: 24 mg/dL — ABNORMAL HIGH (ref 6–23)
CO2: 24 mEq/L (ref 19–32)
Calcium: 9.2 mg/dL (ref 8.4–10.5)
Chloride: 107 mEq/L (ref 96–112)
Creatinine, Ser: 1.37 mg/dL (ref 0.40–1.50)
GFR: 52.6 mL/min — ABNORMAL LOW (ref >60.00)
Glucose, Bld: 102 mg/dL — ABNORMAL HIGH (ref 70–99)
Potassium: 4.1 mEq/L (ref 3.5–5.1)
Sodium: 139 mEq/L (ref 135–145)
Total Bilirubin: 0.9 mg/dL (ref 0.2–1.2)
Total Protein: 7.5 g/dL (ref 6.0–8.3)

## 2022-03-28 LAB — PROTIME-INR
INR: 1 ratio (ref 0.8–1.0)
Prothrombin Time: 10.7 s (ref 9.6–13.1)

## 2022-03-28 LAB — LIPID PANEL
Cholesterol: 175 mg/dL (ref 0–200)
HDL: 30.5 mg/dL — ABNORMAL LOW (ref >39.00)
LDL Cholesterol: 111 mg/dL — ABNORMAL HIGH (ref 0–99)
NonHDL: 144.26
Total CHOL/HDL Ratio: 6
Triglycerides: 168 mg/dL — ABNORMAL HIGH (ref 0.0–149.0)
VLDL: 33.6 mg/dL (ref 0.0–40.0)

## 2022-03-28 LAB — VITAMIN D 25 HYDROXY (VIT D DEFICIENCY, FRACTURES): VITD: 39.44 ng/mL (ref 30.00–100.00)

## 2022-03-29 LAB — PARATHYROID HORMONE, INTACT (NO CA): PTH: 57 pg/mL (ref 16–77)

## 2022-04-01 NOTE — Progress Notes (Addendum)
Cardiology Office Note:    Date:  04/02/2022   ID:  Aaron Cole, DOB 1950/07/13, MRN [id number]  PCP:  [name], MD  Cardiologist:  Sinclair Grooms, MD   Referring MD: [name], MD   Chief Complaint  Patient presents with   Cardiac Valve Problem   Hypertension   Follow-up    Premature ventricular contractions    History of Present Illness:    Aaron Cole is a 71 y.o. male with a hx of aortic valve replacement and repair of ascending aortic aneurysm with a Bentall procedure January 2017. Postoperative atrial fibrillation has not recurred. History of nephrectomy for renal cell carcinoma with resultant elevated creatinine levels and "stage III, CKD" and essential hypertension.  Aaron Cole is doing well.  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  He feels well.  No palpitations, syncope, shortness of breath, edema, orthopnea, other complaints.  At the last visit slightly more than a year ago, we discontinued amlodipine because he felt it was causing dizziness and lethargy.  He has been fine since the medication was discontinued.  He has not seen a physician in slightly more than a year.  Past Medical History:  Diagnosis Date   CAD (coronary artery disease) 2013   per CT scan  - brings CD of CT, no report - will need [facility] records of CT scan report   Cirrhosis of liver ([place]) 01/10/2019   By [place] 03/2020   CKD (chronic kidney disease) stage 3, GFR 30-59 ml/min ([place]) 2015   iatrogenic (decreased nephron mass) established with Dr. Alfonse Alpers [facility])   Diverticulosis    by CT scan   Hepatitis C virus infection without hepatic coma 03/02/2019   History of hepatitis B virus infection    spontaneously cleared   HTN (hypertension)    Renal cell carcinoma ([place]) 2013   clear cell s/p R nephrectomy   S/p nephrectomy 2013   [facility]   Sinus headache    recurrent    Past Surgical History:  Procedure Laterality Date   BENTALL PROCEDURE N/A 07/05/2015   Procedure:  BENTALL PROCEDURE UTILIZING A 28 X 10 MM WOVEN DOUBLE VELOUR VASCULAR GRAFT AND A 28MM GELWEAVE WOVEN VASCULAR GRAFT. 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.;  Surgeon: [name], MD;  Location: [facility];  Service: Open Heart Surgery;  Laterality: N/A;   COLONOSCOPY  11/2008   WNL records in chart [facility])   COLONOSCOPY WITH PROPOFOL N/A 02/24/2019   TA, HP, rpt 5 yrs ([facility])   CORONARY ANGIOPLASTY     ESOPHAGOGASTRODUODENOSCOPY (EGD) WITH PROPOFOL N/A 08/04/2020   Procedure: ESOPHAGOGASTRODUODENOSCOPY (EGD) WITH PROPOFOL;  Surgeon: [name], MD;  Location: [facility] ENDOSCOPY;  Service: Gastroenterology;  Laterality: N/A;   HYDROCELE EXCISION Right 02/05/2020   Procedure: HYDROCELECTOMY ADULT;  Surgeon: Billey Co, MD;  Location: [facility] ORS;  Service: Urology;  Laterality: Right;   NASAL SINUS SURGERY  1978   sinus surgery done at [place] Right 12/2011   [facility], R for RCC   rectal fistula repair  1980s   TEE WITHOUT CARDIOVERSION N/A 07/05/2015   Procedure: TRANSESOPHAGEAL ECHOCARDIOGRAM (TEE);  Surgeon: [name], MD;  Location: [facility];  Service: Open Heart Surgery;  Laterality: N/A;    Current Medications: Current Meds  Medication Sig   aspirin EC 81 MG tablet Take 81 mg by mouth every evening. Swallow whole.   cholecalciferol (VITAMIN D) 25  MCG (1000 UNIT) tablet Take 1,000 Units by mouth daily.   fluticasone (FLONASE) 50 MCG/ACT nasal spray INHALE 2 SPRAYS INTO EACH NOSTRIL EVERY DAY.   Multiple Vitamin (MULTIVITAMIN WITH MINERALS) TABS tablet Take 1 tablet by mouth daily.   Omega-3 Fatty Acids (FISH OIL) 1000 MG CAPS Take 1 capsule (1,000 mg total) by mouth daily.   sildenafil (REVATIO) 20 MG tablet Take 3-5 tablets as needed before sexual activity   telmisartan (MICARDIS) 80 MG tablet TAKE 1/2 TABLET BY MOUTH TWICE A DAY     Allergies:   Carvedilol   Social History   Socioeconomic History   Marital status: Single    Spouse  name: Not on file   Number of children: Not on file   Years of education: Not on file   Highest education level: Not on file  Occupational History   Not on file  Tobacco Use   Smoking status: Never   Smokeless tobacco: Never  Vaping Use   Vaping Use: Never used  Substance and Sexual Activity   Alcohol use: Not Currently   Drug use: Never   Sexual activity: Yes  Other Topics Concern   Not on file  Social History Narrative   Caffeine: occasional   Lives alone, 1 cat   Occupation: farming business   Edu: BS   Activity: bikes 3-4x/wk about 1 hour   Diet: good water, fruits/vegetables daily, steak 1x/wk, fish 2x/wk   Social Determinants of Health   Financial Resource Strain: Low Risk  (01/22/2020)   Overall Financial Resource Strain (CARDIA)    Difficulty of Paying Living Expenses: Not hard at all  Food Insecurity: No Food Insecurity (01/22/2020)   Hunger Vital Sign    Worried About Running Out of Food in the Last Year: Never true    Ran Out of Food in the Last Year: Never true  Transportation Needs: No Transportation Needs (01/22/2020)   PRAPARE - Hydrologist (Medical): No    Lack of Transportation (Non-Medical): No  Physical Activity: Insufficiently Active (01/22/2020)   Exercise Vital Sign    Days of Exercise per Week: 4 days    Minutes of Exercise per Session: 20 min  Stress: No Stress Concern Present (01/22/2020)   [place]    Feeling of Stress : Not at all  Social Connections: Not on file     Family History: The patient's family history includes CAD in his maternal grandfather; Cancer in his paternal aunt, paternal aunt, and paternal grandmother; Colon polyps in his brother and father; Coronary artery disease in his maternal uncle; Crohn's disease in his father; Diabetes in his father; Heart disease in his father and mother; Hypertension in his mother. There is no history of  Stroke.  ROS:   Please see the history of present illness.    His appetite good no chills.  All other systems reviewed and are negative.  EKGs/Labs/Other Studies Reviewed:    The following studies were reviewed today:  ECHOCARDIOGRAM 2022: IMPRESSIONS   1. Left ventricular endocardial border not optimally defined to evaluate  regional wall motion. Overall LV EF appears to be around 50%. Left  ventricular diastolic parameters are consistent with Grade I diastolic  dysfunction (impaired relaxation).   2. Right ventricular systolic function is normal. The right ventricular  size is moderately enlarged. There is normal pulmonary artery systolic  pressure.   3. Left atrial size was mildly dilated.   4.  Right atrial size was mildly dilated.   5. The mitral valve is normal in structure. Trivial mitral valve  regurgitation. No evidence of mitral stenosis. Moderate mitral annular  calcification.   6. The aortic valve has been repaired/replaced. There is a 25 mm Magna  pericardial valve present in the aortic position.      Aortic valve regurgitation is not visualized.   7. Aortic dilatation noted, root/ascending aorta has been  repaired/replaced. S/P Bentall procedure. There is mild dilatation of the  aortic root, measuring 40 mm.   8. The inferior vena cava is normal in size with greater than 50%  respiratory variability, suggesting right atrial pressure of 3 mmHg.   9. Recommend repeat limited study with definity to assess for wall motion  abnormalities and get accurate assessment of EF.    EKG:  EKG sinus rhythm, left anterior hemiblock, versus atypical left bundle branch block, PVC's, and prominent voltage consistent with LVH.  Recent Labs: 03/28/2022: ALT 12; BUN 24; Creatinine, Ser 1.37; Hemoglobin 14.9; Platelets 157.0; Potassium 4.1; Sodium 139  Recent Lipid Panel    Component Value Date/Time   CHOL 175 03/28/2022 0740   TRIG 168.0 (H) 03/28/2022 0740   TRIG 131 06/21/2011  0000   HDL 30.50 (L) 03/28/2022 0740   CHOLHDL 6 03/28/2022 0740   VLDL 33.6 03/28/2022 0740   LDLCALC 111 (H) 03/28/2022 0740   LDLDIRECT 118 06/21/2011 0000    Physical Exam:    VS:  BP (!) 160/102 (BP Location: Right Arm, Patient Position: Sitting, Cuff Size: Large)   Pulse 69   Ht '5\' 11"'$  (1.803 m)   Wt 223 lb 9.6 oz (101.4 kg)   SpO2 95%   BMI 31.19 kg/m     Wt Readings from Last 3 Encounters:  04/02/22 223 lb 9.6 oz (101.4 kg)  02/06/21 223 lb 6.4 oz (101.3 kg)  08/04/20 217 lb (98.4 kg)     GEN: Overweight. No acute distress HEENT: Normal NECK: No JVD. LYMPHATICS: No lymphadenopathy CARDIAC: Soft 1/6 right upper sternal systolic murmur and no diastolic murmur.  Irregular RR no gallop, or edema. VASCULAR:  Normal Pulses. No bruits. RESPIRATORY:  Clear to auscultation without rales, wheezing or rhonchi  ABDOMEN: Soft, non-tender, non-distended, No pulsatile mass, MUSCULOSKELETAL: No deformity  SKIN: Warm and dry NEUROLOGIC:  Alert and oriented x 3 PSYCHIATRIC:  Normal affect   ASSESSMENT:    1. S/P aortic valve replacement   2. Paroxysmal atrial fibrillation (HCC)   3. Essential hypertension   4. Stage 3 chronic kidney disease, unspecified whether stage 3a or 3b CKD ([place])   5. Chronic combined systolic and diastolic heart failure ([place])   6. PVC's (premature ventricular contractions)    PLAN:    In order of problems listed above:  No auscultatory evidence of valve dysfunction. No recurrence of atrial fibrillation although today he is having frequent PVCs. Blood pressure is elevated today at greater than 150/90 mmHg.  He will record blood pressures at home over the next 2 weeks and call us back with repeats.  We will likely need to add additional therapy.  May be adding ARNI and discontinuing Micardis.  An alternative would be consideration of low-dose diuretic.  We will see what his repeat blood pressure looks like.  The most recent echo suggested a diastolic  dysfunction and moderate right ventricular enlargement.  We could conceivably also consider an SGLT2 for both kidney protection and blood pressure lowering. The most recent creatinine was 1.37 with  an estimated GFR of 53 cc/min.  This is stage IIIa CKD. Frequent on exam and also noted on EKG.  3-day monitor to quantitate PVC burden.  Needs 1 year follow-up with assigned cardiologist.  Blood pressure control needs to be better and action will be taken based upon the data that he supplies [place] with.  3-day monitor may require that additional evaluation such as repeat echo.     Medication Adjustments/Labs and Tests Ordered: Current medicines are reviewed at length with the patient today.  Concerns regarding medicines are outlined above.  Orders Placed This Encounter  Procedures   LONG TERM MONITOR (3-14 DAYS)   EKG 12-Lead   No orders of the defined types were placed in this encounter.   Patient Instructions  Medication Instructions:  Your physician recommends that you continue on your current medications as directed. Please refer to the Current Medication list given to you today.  *If you need a refill on your cardiac medications before your next appointment, please call your pharmacy*  Lab Work: NONE  Testing/Procedures: Your physician has requested you wear a Zio heart monitor for 3 days. This will be mailed to your home with instructions on how to apply the monitor and how to return it when finished.  Follow-Up: At [facility] you and your health needs are our priority.  As part of our continuing mission to provide you with exceptional heart care, we have created designated Provider Care Teams.  These Care Teams include your primary Cardiologist (physician) and Advanced Practice Providers (APPs -  Physician Assistants and Nurse Practitioners) who all work together to provide you with the care you need, when you need it.  Your next appointment:   1 year(s)  The  format for your next appointment:   In Person  Provider:   Kate Sable, MD  Other Instructions Monitor your blood pressure at home, check blood pressure at least 2 hours after taking telmisartan (Micardis). Call our office at [phone] in about 1-2 weeks to report your blood pressure readings.  HOW TO TAKE YOUR BLOOD PRESSURE: Rest 5 minutes before taking your blood pressure.  Don't smoke or drink caffeinated beverages for at least 30 minutes before. Take your blood pressure before (not after) you eat. Sit comfortably with your back supported and both feet on the floor (don't cross your legs). Elevate your arm to heart level on a table or a desk. Use the proper sized cuff. It should fit smoothly and snugly around your bare upper arm. There should be enough room to slip a fingertip under the cuff. The bottom edge of the cuff should be 1 inch above the crease of the elbow. Please monitor your blood pressure and if your blood pressure consistently remains above 130 on the top or 80 on the bottom x3 please call our office at [phone] or send a MyChart message   Important Information About Sugar         Signed, Sinclair Grooms, MD  04/02/2022 5:17 PM    [place]

## 2022-04-02 ENCOUNTER — Encounter: Payer: Self-pay | Admitting: Interventional Cardiology

## 2022-04-02 ENCOUNTER — Ambulatory Visit: Payer: Medicare Other | Attending: Interventional Cardiology | Admitting: Interventional Cardiology

## 2022-04-02 VITALS — BP 155/100 | HR 69 | Ht 71.0 in | Wt 223.6 lb

## 2022-04-02 DIAGNOSIS — I5042 Chronic combined systolic (congestive) and diastolic (congestive) heart failure: Secondary | ICD-10-CM | POA: Diagnosis not present

## 2022-04-02 DIAGNOSIS — I10 Essential (primary) hypertension: Secondary | ICD-10-CM

## 2022-04-02 DIAGNOSIS — Z952 Presence of prosthetic heart valve: Secondary | ICD-10-CM | POA: Diagnosis not present

## 2022-04-02 DIAGNOSIS — I493 Ventricular premature depolarization: Secondary | ICD-10-CM | POA: Diagnosis not present

## 2022-04-02 DIAGNOSIS — I480 Paroxysmal atrial fibrillation: Secondary | ICD-10-CM

## 2022-04-02 DIAGNOSIS — N183 Chronic kidney disease, stage 3 unspecified: Secondary | ICD-10-CM | POA: Diagnosis not present

## 2022-04-02 NOTE — Patient Instructions (Signed)
Medication Instructions:  Your physician recommends that you continue on your current medications as directed. Please refer to the Current Medication list given to you today.  *If you need a refill on your cardiac medications before your next appointment, please call your pharmacy*  Lab Work: NONE  Testing/Procedures: Your physician has requested you wear a Zio heart monitor for 3 days. This will be mailed to your home with instructions on how to apply the monitor and how to return it when finished.  Follow-Up: At [facility], you and your health needs are our priority.  As part of our continuing mission to provide you with exceptional heart care, we have created designated Provider Care Teams.  These Care Teams include your primary Cardiologist (physician) and Advanced Practice Providers (APPs -  Physician Assistants and Nurse Practitioners) who all work together to provide you with the care you need, when you need it.  Your next appointment:   1 year(s)  The format for your next appointment:   In Person  Provider:   Kate Sable, MD  Other Instructions Monitor your blood pressure at home, check blood pressure at least 2 hours after taking telmisartan (Micardis). Call our office at [phone] in about 1-2 weeks to report your blood pressure readings.  HOW TO TAKE YOUR BLOOD PRESSURE: Rest 5 minutes before taking your blood pressure.  Don't smoke or drink caffeinated beverages for at least 30 minutes before. Take your blood pressure before (not after) you eat. Sit comfortably with your back supported and both feet on the floor (don't cross your legs). Elevate your arm to heart level on a table or a desk. Use the proper sized cuff. It should fit smoothly and snugly around your bare upper arm. There should be enough room to slip a fingertip under the cuff. The bottom edge of the cuff should be 1 inch above the crease of the elbow. Please monitor your blood pressure and  if your blood pressure consistently remains above 130 on the top or 80 on the bottom x3 please call our office at [phone] or send a MyChart message   Important Information About Sugar

## 2022-04-03 ENCOUNTER — Ambulatory Visit: Payer: BLUE CROSS/BLUE SHIELD | Attending: Interventional Cardiology

## 2022-04-03 DIAGNOSIS — I493 Ventricular premature depolarization: Secondary | ICD-10-CM

## 2022-04-03 NOTE — Progress Notes (Unsigned)
Enrolled for [employer] to mail a ZIO XT long term holter monitor to the patients address on file.  

## 2022-04-04 ENCOUNTER — Encounter: Payer: Self-pay | Admitting: Family Medicine

## 2022-04-04 ENCOUNTER — Ambulatory Visit (INDEPENDENT_AMBULATORY_CARE_PROVIDER_SITE_OTHER): Payer: Medicare Other | Admitting: Family Medicine

## 2022-04-04 VITALS — BP 124/82 | HR 66 | Temp 97.3°F | Ht 69.25 in | Wt 222.1 lb

## 2022-04-04 DIAGNOSIS — Z905 Acquired absence of kidney: Secondary | ICD-10-CM | POA: Diagnosis not present

## 2022-04-04 DIAGNOSIS — Z23 Encounter for immunization: Secondary | ICD-10-CM | POA: Diagnosis not present

## 2022-04-04 DIAGNOSIS — I10 Essential (primary) hypertension: Secondary | ICD-10-CM

## 2022-04-04 DIAGNOSIS — Z85528 Personal history of other malignant neoplasm of kidney: Secondary | ICD-10-CM | POA: Diagnosis not present

## 2022-04-04 DIAGNOSIS — K7460 Unspecified cirrhosis of liver: Secondary | ICD-10-CM | POA: Diagnosis not present

## 2022-04-04 DIAGNOSIS — Z7189 Other specified counseling: Secondary | ICD-10-CM | POA: Diagnosis not present

## 2022-04-04 DIAGNOSIS — Z0000 Encounter for general adult medical examination without abnormal findings: Secondary | ICD-10-CM

## 2022-04-04 DIAGNOSIS — N1831 Chronic kidney disease, stage 3a: Secondary | ICD-10-CM | POA: Diagnosis not present

## 2022-04-04 DIAGNOSIS — Z952 Presence of prosthetic heart valve: Secondary | ICD-10-CM

## 2022-04-04 DIAGNOSIS — Z9889 Other specified postprocedural states: Secondary | ICD-10-CM

## 2022-04-04 DIAGNOSIS — Z8679 Personal history of other diseases of the circulatory system: Secondary | ICD-10-CM

## 2022-04-04 DIAGNOSIS — Z125 Encounter for screening for malignant neoplasm of prostate: Secondary | ICD-10-CM | POA: Diagnosis not present

## 2022-04-04 DIAGNOSIS — B1920 Unspecified viral hepatitis C without hepatic coma: Secondary | ICD-10-CM

## 2022-04-04 DIAGNOSIS — E782 Mixed hyperlipidemia: Secondary | ICD-10-CM

## 2022-04-04 NOTE — Assessment & Plan Note (Signed)
Chronic, off statin. Reviewed diet choices to improve triglyceride and LDL levels.  The 10-year ASCVD risk score (Arnett DK, et al., 2019) is: 21.6%   Values used to calculate the score:     Age: 71 years     Sex: Male     Is Non-Hispanic African American: No     Diabetic: No     Tobacco smoker: No     Systolic Blood Pressure: 254 mmHg     Is BP treated: Yes     HDL Cholesterol: 30.5 mg/dL     Total Cholesterol: 175 mg/dL

## 2022-04-04 NOTE — Assessment & Plan Note (Signed)
Overdue for GI f/u - he will call and schedule. MELD 3.0: 10 at 03/28/2022  7:40 AM MELD-Na: 10 at 03/28/2022  7:40 AM Calculated from: Serum Creatinine: 1.37 mg/dL at 03/28/2022  7:40 AM Serum Sodium: 139 mEq/L (Using max of 137 mEq/L) at 03/28/2022  7:40 AM Total Bilirubin: 0.9 mg/dL (Using min of 1 mg/dL) at 03/28/2022  7:40 AM Serum Albumin: 4.2 g/dL (Using max of 3.5 g/dL) at 03/28/2022  7:40 AM INR(ratio): 1.0 ratio at 03/28/2022  7:40 AM Age at listing (hypothetical): 81 years Sex: Male at 03/28/2022  7:40 AM

## 2022-04-04 NOTE — Progress Notes (Signed)
Patient ID: Aaron Cole, male    DOB: Aug 19, 1950, 71 y.o.   MRN: [id number]  This visit was conducted in person.  BP 124/82   Pulse 66   Temp (!) 97.3 F (36.3 C) (Temporal)   Ht 5' 9.25" (1.759 m)   Wt 222 lb 2 oz (100.8 kg)   SpO2 96%   BMI 32.57 kg/m    CC: AMW  Subjective:   HPI: Aaron Cole is a 71 y.o. male presenting on 04/04/2022 for Medicare Wellness   Did not see health advisor this year.  Last seen 12/2019.   Hearing Screening   '500Hz'$  '1000Hz'$  '2000Hz'$  '4000Hz'$   Right ear 20 40 25 0  Left ear 20 40 20 40  Vision Screening - Comments:: Last eye exam, 08/2021.  [place] Office Visit from 04/04/2022 in [place] [facility] [place]  PHQ-2 Total Score 0          04/04/2022    3:50 PM 01/22/2020    3:37 PM 01/08/2019   11:41 AM 12/30/2017    8:39 AM 12/27/2016    4:07 PM  Fall Risk   Falls in the past year? 0 0 0 No No  Number falls in past yr:  0     Injury with Fall?  0     Risk for fall due to :  Medication side effect     Follow up  Falls evaluation completed;Falls prevention discussed      BP well controlled in office and at home.    Had [place] procedure 07/2015 - ascending aortic aneurysm repair with AVR ([place]). Sees cardiology Dr Tamala Julian regularly.   S/p R nephrectomy for renal cell cancer (2013). Sees Tawni Levy NP at [facility] nephrology - sees yearly in December.   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 H/o spontaneously cleared prior Hep B infection.   Last saw GI 2021 - due for f/u. Latest RUQ [place] 08/2020 reviewed.     Preventative: COLONOSCOPY WITH PROPOFOL 02/24/2019 - TA, HP, rpt 5 yrs ([place]) EGD 08/2020 - WNL, HH, rpt 3 yrs Vicente Males) Prostate cancer screening - discussed, yearly PSA  Lung cancer screening - not eligible AAA screen - not eligible. Flu yearly Tetanus - 08/2012 prevnar - 12/2017, pneumovax 12/2018 COVID vaccine - completed [place] series 08/2019 Shingrix - discussed.  Advanced directive: reviewed through  [employer]. HCPOA is aunt Aaron Cole then Aaron Cole. Grants discretion to [facility] for life support decision if terminal or incurable condition.  Seat belt use discussed.  Sunscreen use discussed. No changing moles on skin.  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 Interim medical history since our last visit reviewed. Allergies and medications reviewed and updated. Outpatient Medications Prior to Visit  Medication Sig Dispense Refill   aspirin EC 81 MG tablet Take 81 mg by mouth every evening. Swallow whole.     cholecalciferol (VITAMIN D) 25 MCG (1000 UNIT) tablet Take 1,000 Units by mouth daily.     fluticasone (FLONASE) 50 MCG/ACT nasal spray INHALE 2 SPRAYS INTO EACH NOSTRIL EVERY DAY. 16 g 3   Multiple Vitamin (  MULTIVITAMIN WITH MINERALS) TABS tablet Take 1 tablet by mouth daily.     Omega-3 Fatty Acids (FISH OIL) 1000 MG CAPS Take 1 capsule (1,000 mg total) by mouth daily.  0   sildenafil (REVATIO) 20 MG tablet Take 3-5 tablets as needed before sexual activity 30 tablet 6   telmisartan (MICARDIS) 80 MG tablet TAKE 1/2 TABLET BY MOUTH TWICE A DAY 90 tablet 2   No facility-administered medications prior to visit.     Per HPI unless specifically indicated in ROS section below Review of Systems  Objective:  BP 124/82   Pulse 66   Temp (!) 97.3 F (36.3 C) (Temporal)   Ht 5' 9.25" (1.759 m)   Wt 222 lb 2 oz (100.8 kg)   SpO2 96%   BMI 32.57 kg/m   Wt Readings from Last 3 Encounters:  04/04/22 222 lb 2 oz (100.8 kg)  04/02/22 223 lb 9.6 oz (101.4 kg)  02/06/21 223 lb 6.4 oz (101.3 kg)       Physical Exam Vitals and nursing note reviewed.  Constitutional:      General: He is not in acute distress.    Appearance: Normal appearance. He is well-developed. He is not ill-appearing.  HENT:     Head: Normocephalic and atraumatic.     Right Ear: Hearing, tympanic membrane, ear canal and external ear normal.     Left Ear: Hearing, tympanic membrane, ear canal and external ear normal.  Eyes:     General: No scleral icterus.    Extraocular Movements: Extraocular movements intact.     Conjunctiva/sclera: Conjunctivae normal.     Pupils: Pupils are equal, round, and reactive to light.  Neck:     Thyroid: No thyroid mass or thyromegaly.     Vascular: No carotid bruit.  Cardiovascular:     Rate and Rhythm: Normal rate and regular rhythm.     Pulses: Normal pulses.          Radial pulses are 2+ on the right side and 2+ on the left side.     Heart sounds: Normal heart sounds. No murmur heard. Pulmonary:     Effort: Pulmonary effort is normal. No respiratory distress.     Breath sounds: Normal breath sounds. No wheezing, rhonchi or rales.  Abdominal:     General: Bowel sounds are normal. There is no distension.     Palpations: Abdomen is soft. There is no mass.     Tenderness: There is no abdominal tenderness. There is no guarding or rebound.     Hernia: No hernia is present.  Musculoskeletal:        General: Normal range of motion.     Cervical back: Normal range of motion and neck supple.     Right lower leg: No edema.     Left lower leg: No edema.  Lymphadenopathy:     Cervical: No cervical adenopathy.  Skin:    General: Skin is warm and dry.     Findings: No rash.  Neurological:     General: No focal deficit present.     Mental Status: He is alert and oriented to person, place, and time.     Comments:  Recall 2/3, 3/3 with cue Calculation 5/5 DLROW  Psychiatric:        Mood and Affect: Mood normal.        Behavior: Behavior normal.        Thought Content: Thought  content normal.  Judgment: Judgment normal.       Results for orders placed or performed in visit on 03/28/22  Protime-INR  Result Value Ref Range   INR 1.0 0.8 - 1.0 ratio   Prothrombin Time 10.7 9.6 - 13.1 sec  Parathyroid hormone, intact (no Ca)  Result Value Ref Range   PTH 57 16 - 77 pg/mL  VITAMIN D 25 Hydroxy (Vit-D Deficiency, Fractures)  Result Value Ref Range   VITD 39.44 30.00 - 100.00 ng/mL  CBC with Differential/Platelet  Result Value Ref Range   WBC 7.3 4.0 - 10.5 K/uL   RBC 5.06 4.22 - 5.81 Mil/uL   Hemoglobin 14.9 13.0 - 17.0 g/dL   HCT 43.7 39.0 - 52.0 %   MCV 86.3 78.0 - 100.0 fl   MCHC 34.1 30.0 - 36.0 g/dL   RDW 13.6 11.5 - 15.5 %   Platelets 157.0 150.0 - 400.0 K/uL   Neutrophils Relative % 54.8 43.0 - 77.0 %   Lymphocytes Relative 34.6 12.0 - 46.0 %   Monocytes Relative 8.2 3.0 - 12.0 %   Eosinophils Relative 1.9 0.0 - 5.0 %   Basophils Relative 0.5 0.0 - 3.0 %   Neutro Abs 4.0 1.4 - 7.7 K/uL   Lymphs Abs 2.5 0.7 - 4.0 K/uL   Monocytes Absolute 0.6 0.1 - 1.0 K/uL   Eosinophils Absolute 0.1 0.0 - 0.7 K/uL   Basophils Absolute 0.0 0.0 - 0.1 K/uL  Microalbumin / creatinine urine ratio  Result Value Ref Range   Microalb, Ur 1.4 0.0 - 1.9 mg/dL   Creatinine,U 103.4 mg/dL   Microalb Creat Ratio 1.3 0.0 - 30.0 mg/g  Comprehensive metabolic panel  Result Value Ref Range   Sodium 139 135 - 145 mEq/L   Potassium 4.1 3.5 - 5.1 mEq/L   Chloride 107 96 - 112 mEq/L   CO2 24 19 - 32 mEq/L   Glucose, Bld 102 (H) 70 - 99 mg/dL   BUN 24 (H) 6 - 23 mg/dL   Creatinine, Ser 1.37 0.40 - 1.50 mg/dL   Total Bilirubin 0.9 0.2 - 1.2 mg/dL   Alkaline Phosphatase 59 39 - 117 U/L   AST 18 0 - 37 U/L   ALT 12 0 - 53 U/L   Total Protein 7.5 6.0 - 8.3 g/dL   Albumin 4.2 3.5 - 5.2 g/dL   GFR 52.60 (L) >60.00 mL/min   Calcium 9.2 8.4 - 10.5 mg/dL  Lipid panel  Result Value Ref Range   Cholesterol 175 0 - 200 mg/dL   Triglycerides 168.0 (H) 0.0 - 149.0 mg/dL    HDL 30.50 (L) >39.00 mg/dL   VLDL 33.6 0.0 - 40.0 mg/dL   LDL Cholesterol 111 (H) 0 - 99 mg/dL   Total CHOL/HDL Ratio 6    NonHDL 144.26    Lab Results  Component Value Date   PSA 1.24 01/15/2020   PSA 1.24 01/08/2019   PSA 1.34 12/23/2017   Assessment & Plan:   Problem List Items Addressed This Visit     Medicare annual wellness visit, subsequent - Primary (Chronic)    I have personally reviewed the Medicare Annual Wellness questionnaire and have noted 1. The patient's medical and social history 2. Their use of alcohol, tobacco or illicit drugs 3. Their current medications and supplements 4. The patient's functional ability including ADL's, fall risks, home safety risks and hearing or visual impairment. Cognitive function has been assessed and addressed as indicated.  5. Diet and physical activity 6. Evidence for  depression or mood disorders The patients weight, height, BMI have been recorded in the chart. I have made referrals, counseling and provided education to the patient based on review of the above and I have provided the pt with a written personalized care plan for preventive services. Provider list updated.. See scanned questionairre as needed for further documentation. Reviewed preventative protocols and updated unless pt declined.       Advanced care planning/counseling discussion (Chronic)    Advanced directive: reviewed through [employer]. HCPOA is aunt Aaron Cole then Aaron Cole. Grants discretion to [facility] for life support decision if terminal or incurable condition.       HTN (hypertension)    Chronic, stable. Continue current regimen of telmisartan '80mg'$  daily.       History of renal cell carcinoma    Regularly sees [facility] nephrology.       S/p nephrectomy   CKD (chronic kidney disease) stage 3, GFR 30-59 ml/min (HCC)    Chronic, stable period - anticipate improved with alcohol cessation.       HLD (hyperlipidemia)    Chronic, off statin. Reviewed diet  choices to improve triglyceride and LDL levels.  The 10-year ASCVD risk score (Arnett DK, et al., 2019) is: 21.6%   Values used to calculate the score:     Age: 58 years     Sex: Male     Is Non-Hispanic African American: No     Diabetic: No     Tobacco smoker: No     Systolic Blood Pressure: 601 mmHg     Is BP treated: Yes     HDL Cholesterol: 30.5 mg/dL     Total Cholesterol: 175 mg/dL       S/P ascending aortic aneurysm repair   S/P aortic valve replacement   Cirrhosis of liver without ascites ([place])    Overdue for GI f/u - he will call and schedule. MELD 3.0: 10 at 03/28/2022  7:40 AM MELD-Na: 10 at 03/28/2022  7:40 AM Calculated from: Serum Creatinine: 1.37 mg/dL at 03/28/2022  7:40 AM Serum Sodium: 139 mEq/L (Using max of 137 mEq/L) at 03/28/2022  7:40 AM Total Bilirubin: 0.9 mg/dL (Using min of 1 mg/dL) at 03/28/2022  7:40 AM Serum Albumin: 4.2 g/dL (Using max of 3.5 g/dL) at 03/28/2022  7:40 AM INR(ratio): 1.0 ratio at 03/28/2022  7:40 AM Age at listing (hypothetical): 59 years Sex: Male at 03/28/2022  7:40 AM       Hepatitis C virus infection without hepatic coma    Completed treatment for this 2020      Other Visit Diagnoses     Need for influenza vaccination       Relevant Orders   Flu Vaccine QUAD High Dose(Fluad) (Completed)   Special screening for malignant neoplasm of prostate       Relevant Orders   PSA, Medicare        No orders of the defined types were placed in this encounter.  Orders Placed This Encounter  Procedures   Flu Vaccine QUAD High Dose(Fluad)   PSA, Medicare    Standing Status:   Future    Standing Expiration Date:   04/05/2023    Patient instructions: Flu shot today  If interested, check with pharmacy about new 2 shot shingles series (shingrix).  Call Dr Vicente Males to schedule follow up visit.  Return in 1 year for next physical /wellness visit  Schedule lab visit for PSA at your convenience.   Follow up plan: Return in about  1 year  (around 04/05/2023) for annual exam, prior fasting for blood work, medicare wellness visit.  [name], MD

## 2022-04-04 NOTE — Assessment & Plan Note (Addendum)
Chronic, stable. Continue current regimen of telmisartan '80mg'$  daily.

## 2022-04-04 NOTE — Assessment & Plan Note (Signed)

## 2022-04-04 NOTE — Assessment & Plan Note (Signed)
Completed treatment for this 2020

## 2022-04-04 NOTE — Assessment & Plan Note (Signed)
Regularly sees [facility] nephrology.

## 2022-04-04 NOTE — Assessment & Plan Note (Signed)
Chronic, stable period - anticipate improved with alcohol cessation.

## 2022-04-04 NOTE — Assessment & Plan Note (Signed)
Advanced directive: reviewed through [employer]. HCPOA is aunt June Reece then Audley Hose. Grants discretion to [facility] for life support decision if terminal or incurable condition.

## 2022-04-04 NOTE — Patient Instructions (Addendum)
Flu shot today  If interested, check with pharmacy about new 2 shot shingles series (shingrix).  Call Dr Vicente Males to schedule follow up visit.  Return in 1 year for next physical /wellness visit  Schedule lab visit for PSA at your convenience.   Health Maintenance After Age 69 After age 32, you are at a higher risk for certain long-term diseases and infections as well as injuries from falls. Falls are a major cause of broken bones and head injuries in people who are older than age 53. Getting regular preventive care can help to keep you healthy and well. Preventive care includes getting regular testing and making lifestyle changes as recommended by your health care provider. Talk with your health care provider about: Which screenings and tests you should have. A screening is a test that checks for a disease when you have no symptoms. A diet and exercise plan that is right for you. What should I know about screenings and tests to prevent falls? Screening and testing are the best ways to find a health problem early. Early diagnosis and treatment give you the best chance of managing medical conditions that are common after age 5. Certain conditions and lifestyle choices may make you more likely to have a fall. Your health care provider may recommend: Regular vision checks. Poor vision and conditions such as cataracts can make you more likely to have a fall. If you wear glasses, make sure to get your prescription updated if your vision changes. Medicine review. Work with your health care provider to regularly review all of the medicines you are taking, including over-the-counter medicines. Ask your health care provider about any side effects that may make you more likely to have a fall. Tell your health care provider if any medicines that you take make you feel dizzy or sleepy. Strength and balance checks. Your health care provider may recommend certain tests to check your strength and balance while standing,  walking, or changing positions. Foot health exam. Foot pain and numbness, as well as not wearing proper footwear, can make you more likely to have a fall. Screenings, including: Osteoporosis screening. Osteoporosis is a condition that causes the bones to get weaker and break more easily. Blood pressure screening. Blood pressure changes and medicines to control blood pressure can make you feel dizzy. Depression screening. You may be more likely to have a fall if you have a fear of falling, feel depressed, or feel unable to do activities that you used to do. Alcohol use screening. Using too much alcohol can affect your balance and may make you more likely to have a fall. Follow these instructions at home: Lifestyle Do not drink alcohol if: Your health care provider tells you not to drink. If you drink alcohol: Limit how much you have to: 0-1 drink a day for women. 0-2 drinks a day for men. Know how much alcohol is in your drink. In the [place] one drink equals one 12 oz bottle of beer (355 mL), one 5 oz glass of wine (148 mL), or one 1 oz glass of hard liquor (44 mL). Do not use any products that contain nicotine or tobacco. These products include cigarettes, chewing tobacco, and vaping devices, such as e-cigarettes. If you need help quitting, ask your health care provider. Activity  Follow a regular exercise program to stay fit. This will help you maintain your balance. Ask your health care provider what types of exercise are appropriate for you. If you need a cane or walker,  use it as recommended by your health care provider. Wear supportive shoes that have nonskid soles. Safety  Remove any tripping hazards, such as rugs, cords, and clutter. Install safety equipment such as grab bars in bathrooms and safety rails on stairs. Keep rooms and walkways well-lit. General instructions Talk with your health care provider about your risks for falling. Tell your health care provider if: You fall.  Be sure to tell your health care provider about all falls, even ones that seem minor. You feel dizzy, tiredness (fatigue), or off-balance. Take over-the-counter and prescription medicines only as told by your health care provider. These include supplements. Eat a healthy diet and maintain a healthy weight. A healthy diet includes low-fat dairy products, low-fat (lean) meats, and fiber from whole grains, beans, and lots of fruits and vegetables. Stay current with your vaccines. Schedule regular health, dental, and eye exams. Summary Having a healthy lifestyle and getting preventive care can help to protect your health and wellness after age 67. Screening and testing are the best way to find a health problem early and help you avoid having a fall. Early diagnosis and treatment give you the best chance for managing medical conditions that are more common for people who are older than age 81. Falls are a major cause of broken bones and head injuries in people who are older than age 92. Take precautions to prevent a fall at home. Work with your health care provider to learn what changes you can make to improve your health and wellness and to prevent falls. This information is not intended to replace advice given to you by your health care provider. Make sure you discuss any questions you have with your health care provider. Document Revised: 11/07/2020 Document Reviewed: 11/07/2020 Elsevier Patient Education  [place].

## 2022-04-10 DIAGNOSIS — I493 Ventricular premature depolarization: Secondary | ICD-10-CM | POA: Diagnosis not present

## 2022-04-17 ENCOUNTER — Other Ambulatory Visit (INDEPENDENT_AMBULATORY_CARE_PROVIDER_SITE_OTHER): Payer: Medicare Other

## 2022-04-17 DIAGNOSIS — Z125 Encounter for screening for malignant neoplasm of prostate: Secondary | ICD-10-CM | POA: Diagnosis not present

## 2022-04-17 LAB — PSA, MEDICARE: PSA: 1.35 ng/mL (ref 0.10–4.00)

## 2022-04-21 DIAGNOSIS — I493 Ventricular premature depolarization: Secondary | ICD-10-CM | POA: Diagnosis not present

## 2022-04-26 ENCOUNTER — Telehealth: Payer: Self-pay

## 2022-04-26 DIAGNOSIS — K7460 Unspecified cirrhosis of liver: Secondary | ICD-10-CM

## 2022-04-26 NOTE — Telephone Encounter (Signed)
Yes please

## 2022-04-26 NOTE — Telephone Encounter (Signed)
Dr. Vicente Cole, patient called wanting to know if he needed to follow up with you since he hasn't done so in 2 years. I told him that he was to follow up every 6 months. He stated that he did not know and that we never told him. I told him that he now knew and therefore, I was going to ask you if it was okay for me to schedule him an Ultrasound and then a follow up appointment with you. Please advise.

## 2022-04-27 NOTE — Telephone Encounter (Signed)
Called patient but had to leave him a detailed message letting him know that Dr. Vicente Males was okay for me to order him an ultrasound and then follow up with him. Appointments were scheduled and patient is able to see them on MyChart and I am also mailing the appointment reminders.

## 2022-05-03 ENCOUNTER — Telehealth: Payer: Self-pay | Admitting: Interventional Cardiology

## 2022-05-03 DIAGNOSIS — I493 Ventricular premature depolarization: Secondary | ICD-10-CM

## 2022-05-03 MED ORDER — METOPROLOL SUCCINATE ER 25 MG PO TB24: 25.0000 mg | ORAL_TABLET | Freq: Every day | ORAL | 3 refills | Status: DC

## 2022-05-03 NOTE — Telephone Encounter (Signed)
Spoke with patient and discussed heart monitor results.  Per Dr. Tamala Julian: Let the patient know there is very high burden of PVC's which could weaken the heart. Start Toprol XL 25 mg daily, have him record blood pressures and report. He needs EP consult for high burden PVC's and low normal LVEF.   Metoprolol succinate (Toprol XL) '25mg'$  QD sent to [facility] in [place], [place].  Referral for EP consult placed, scheduler to call patient and setup appt.  Patient verbalized understanding of the above and expressed appreciation for call.

## 2022-05-03 NOTE — Telephone Encounter (Signed)
-----   Message from [name], MD sent at 04/29/2022  3:27 PM EDT ----- Let the patient know there is very high burden of PVC's which could weaken the heart. Start Toprol XL 25 mg daily, have him record blood pressures and report. He needs EP consult for high burden PVC's and low normal LVEF. A copy will be sent to Stacie Glaze, DO

## 2022-05-11 ENCOUNTER — Ambulatory Visit
Admission: RE | Admit: 2022-05-11 | Discharge: 2022-05-11 | Disposition: A | Discharge status: Home / Self Care | Payer: Medicare Other | Source: Ambulatory Visit | Attending: Gastroenterology | Admitting: Gastroenterology

## 2022-05-11 DIAGNOSIS — K8020 Calculus of gallbladder without cholecystitis without obstruction: Secondary | ICD-10-CM | POA: Diagnosis not present

## 2022-05-11 DIAGNOSIS — K7460 Unspecified cirrhosis of liver: Secondary | ICD-10-CM | POA: Insufficient documentation

## 2022-05-15 ENCOUNTER — Encounter: Payer: Self-pay | Admitting: Gastroenterology

## 2022-05-15 ENCOUNTER — Ambulatory Visit (INDEPENDENT_AMBULATORY_CARE_PROVIDER_SITE_OTHER): Payer: Medicare Other | Admitting: Gastroenterology

## 2022-05-15 VITALS — BP 140/78 | HR 69 | Temp 97.8°F | Wt 225.8 lb

## 2022-05-15 DIAGNOSIS — K7460 Unspecified cirrhosis of liver: Secondary | ICD-10-CM

## 2022-05-15 NOTE — Progress Notes (Unsigned)
[name] MD, MRCP(U.K) [address]  [place]  [place], [place] [address]  Main: [phone]  Fax: [phone]   Primary Care Physician: [name], MD  Primary Gastroenterologist:  Dr. [name]   Chief Complaint  Patient presents with   Cirrhosis of the liver    HPI: Aaron Cole is a 71 y.o. male  Summary of history :   Initially referred and seen in 2020 for chronic hepatitis C.  Hepatitis B core total antibody positive.  Viral hepatitis and autoimmune hepatitis panel otherwise negative.  Hepatitis A total antibody positive.  He was treated for chronic hepatitis C and attained cure confirmed in 2021.  Ultrasound in 2021 showed features suggestive of liver cirrhosis.   02/24/2019: Colonoscopy: 3 mm polyp in the ascending colon, 5 mm polyp in the sigmoid colon resected diverticulosis of the colon nonbleeding internal hemorrhoids, the polyps were tubular adenomas times 1 repeat colonoscopy in 2025    Interval history  03/01/2020-05/15/2022  Was not seen after the last visit it appears that our office may have forgotten to schedule his follow-up he is doing well otherwise does not drink any alcohol   08/04/2020: EGD: Medium sized hiatal hernia no esophageal varices 05/11/2022 right upper quadrant ultrasound cirrhosis no hepatic lesion 03/28/2022: Hemoglobin 14.9 platelet count of 157, INR 1.0, albumin 4.2 creatinine 1.37  MELD score of 10.      Current Outpatient Medications  Medication Sig Dispense Refill   aspirin EC 81 MG tablet Take 81 mg by mouth every evening. Swallow whole.     cholecalciferol (VITAMIN D) 25 MCG (1000 UNIT) tablet Take 1,000 Units by mouth daily.     fluticasone (FLONASE) 50 MCG/ACT nasal spray INHALE 2 SPRAYS INTO EACH NOSTRIL EVERY DAY. 16 g 3   metoprolol succinate (TOPROL XL) 25 MG 24 hr tablet Take 1 tablet (25 mg total) by mouth daily. 90 tablet 3   Multiple Vitamin (MULTIVITAMIN WITH MINERALS) TABS tablet Take 1 tablet by  mouth daily.     Omega-3 Fatty Acids (FISH OIL) 1000 MG CAPS Take 1 capsule (1,000 mg total) by mouth daily.  0   sildenafil (REVATIO) 20 MG tablet Take 3-5 tablets as needed before sexual activity 30 tablet 6   telmisartan (MICARDIS) 80 MG tablet TAKE 1/2 TABLET BY MOUTH TWICE A DAY 90 tablet 2   No current facility-administered medications for this visit.    Allergies as of 05/15/2022 - Review Complete 05/15/2022  Allergen Reaction Noted   Carvedilol Other (See Comments) 12/19/2016    ROS:  General: Negative for anorexia, weight loss, fever, chills, fatigue, weakness. ENT: Negative for hoarseness, difficulty swallowing , nasal congestion. CV: Negative for chest pain, angina, palpitations, dyspnea on exertion, peripheral edema.  Respiratory: Negative for dyspnea at rest, dyspnea on exertion, cough, sputum, wheezing.  GI: See history of present illness. GU:  Negative for dysuria, hematuria, urinary incontinence, urinary frequency, nocturnal urination.  Endo: Negative for unusual weight change.    Physical Examination:   BP (!) 140/78   Pulse 69   Temp 97.8 F (36.6 C) (Oral)   Wt 225 lb 12.8 oz (102.4 kg)   BMI 33.10 kg/m   General: Well-nourished, well-developed in no acute distress.  Eyes: No icterus. Conjunctivae pink. Neuro: Alert and oriented x 3.  Grossly intact. Skin: Warm and dry, no jaundice.   Psych: Alert and cooperative, normal mood and affect.   Imaging Studies: US ABDOMEN LIMITED RUQ (LIVER/GB)  Result Date: 05/11/2022  CLINICAL DATA:  Cirrhosis EXAM: ULTRASOUND ABDOMEN LIMITED RIGHT UPPER QUADRANT COMPARISON:  Ultrasound abdomen September 15, 2020 FINDINGS: Gallbladder: Cholelithiasis. No gallbladder wall thickening or pericholecystic fluid. Negative sonographic Murphy's sign. Common bile duct: Diameter: 4 mm Liver: Coarsened echogenicity and nodular contour. No focal lesion. Portal vein is patent on color Doppler imaging with normal direction of blood flow  towards the liver. Other: None. IMPRESSION: 1. Cholelithiasis without secondary signs of acute cholecystitis. 2. Cirrhosis. No focal hepatic lesion. Electronically Signed   By: Lovey Newcomer M.D.   On: 05/11/2022 13:59   LONG TERM MONITOR (3-14 DAYS)  Result Date: 04/29/2022   Basic rhythm is NSR with BBB/IVCD with average HR 75 bpm.   PVC burden 24% with episodes of ventricular bigeminy and trigeminy   One 4 beat nonsustained VT Patch Wear Time:  2 days and 22 hours (2023-10-10T23:38:18-0400 to 2023-10-13T22:32:08-398) Patient had a min HR of 53 bpm, max HR of 133 bpm, and avg HR of 75 bpm. Predominant underlying rhythm was Sinus Rhythm, Bundle Branch Block/IVCD was present, 1 run of Ventricular Tachycardia occurred lasting 4 beats with a max rate of 133 bpm (avg 125 bpm). Isolated SVEs were rare (<1.0%), SVE Couplets were rare (<1.0%), and SVE Triplets were rare (<1.0%). Isolated VEs were frequent (23.6%, [id number]), VE Couplets were rare (<1.0%, 396), and VE Triplets were rare (<1.0%, 37). Ventricular Bigeminy and Trigeminy were present.    Assessment and Plan:   Aaron Cole is a 71 y.o. y/o male   here to follow-up for hepatitis C genotype 1a, treatment nave, noncirrhotic.  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  Status post treatment of hepatitis C and he has been cured no virus detected 8 months after treatment.  Immune to hepatitis A.  MELD score of 10.  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  completed pneumococcal vaccine in 2020   Plan 1.  Right upper quadrant ultrasound to screen for [facility] in May 2024 2.  EGD to screen for esophageal varices in 2025 3.  Avoid NSAIDs, limit excess alcohol, lose weight exercise 4.  Recommend shingles vaccine    Dr [name]  MD,MRCP [facility]) Follow up in 6 months

## 2022-05-28 ENCOUNTER — Telehealth: Payer: Self-pay | Admitting: Interventional Cardiology

## 2022-05-28 NOTE — Telephone Encounter (Signed)
Called and spoke to patient. He was started on Toprol 25 mg QD due to increased PVC burden on Zio monitor. He states that he started the medicine and had severe diarrhea. He states that he stopped it for a few days and it resloved. He attempted to restart it again and had diarrhea again. He does not want to take the Toprol. He has an appointment with Dr. Quentin Ore on Wednesday. Instructed for patient to discuss treatment options for his PVCs at that time. He verbalized understanding and thanked me for the call.

## 2022-05-28 NOTE — Telephone Encounter (Signed)
Pt c/o medication issue:  1. Name of Medication: metoprolol succinate (TOPROL XL) 25 MG 24 hr tablet   2. How are you currently taking this medication (dosage and times per day)? Quit taking on 05/26/22  3. Are you having a reaction (difficulty breathing--STAT)? diarrhea  4. What is your medication issue? Patient states that ever since he started this medication he has had diarrhea. He has decided to stop taking it because he just can't handle the side effect. He wants to know if there is another medication he should try. He says that his BP has been good so far.

## 2022-05-29 NOTE — Progress Notes (Signed)
Electrophysiology Office Note:    Date:  05/30/2022   ID:  Aaron Cole, DOB 1951/06/04, MRN [id number]  PCP:  [name], MD  [facility] Cardiologist:  Sinclair Grooms, MD  [facility] Electrophysiologist:  Vickie Epley, MD   Referring MD: [name], MD   Chief Complaint: PVCs  History of Present Illness:    Aaron Cole is a 71 y.o. male who presents for an evaluation of PVCs at the request of Dr Tamala Julian. Their medical history includes hx Bentall procedure 07/2015 for ascending aortic aneurysm, nephrectomy for RCC, HTN, cirrhosis, hx of HCV and HBV.At his most recent appointment with Dr Tamala Julian on 04/02/2022, frequent PVCs were noted and a Zio patch was ordered which showed 24% PVCs. Today he tells me he is asymptomatic with the PVCs.       Past Medical History:  Diagnosis Date   CAD (coronary artery disease) 2013   per CT scan  - brings CD of CT, no report - will need [facility] records of CT scan report   Cirrhosis of liver ([place]) 01/10/2019   By [place] 03/2020   CKD (chronic kidney disease) stage 3, GFR 30-59 ml/min ([place]) 2015   iatrogenic (decreased nephron mass) established with Dr. Al[facility])   Diverticulosis    by CT scan   Hepatitis C virus infection without hepatic coma 03/02/2019   History of hepatitis B virus infection    spontaneously cleared   HTN (hypertension)    Renal cell carcinoma ([place]) 2013   clear cell s/p R nephrectomy   S/p nephrectomy 2013   [facility]   Sinus headache    recurrent    Past Surgical History:  Procedure Laterality Date   BENTALL PROCEDURE N/A 07/05/2015   Procedure: BENTALL PROCEDURE UTILIZING A 28 X 10 MM WOVEN DOUBLE VELOUR VASCULAR GRAFT AND A 28MM GELWEAVE WOVEN VASCULAR GRAFT. 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.;  Surgeon: [name], MD;  Location: [place] OR;  Service: Open Heart Surgery;  Laterality: N/A;   COLONOSCOPY  11/2008   WNL records in chart [facility])   COLONOSCOPY  WITH PROPOFOL N/A 02/24/2019   TA, HP, rpt 5 yrs ([place])   CORONARY ANGIOPLASTY     ESOPHAGOGASTRODUODENOSCOPY (EGD) WITH PROPOFOL N/A 08/04/2020   Procedure: ESOPHAGOGASTRODUODENOSCOPY (EGD) WITH PROPOFOL;  Surgeon: [name], MD;  [facility] ENDOSCOPY;  Service: Gastroenterology;  Laterality: N/A;   HYDROCELE EXCISION Right 02/05/2020   Procedure: HYDROCELECTOMY ADULT;  Surgeon: Billey Co, MD;  Location: [facility] ORS;  Service: Urology;  Laterality: Right;   NASAL SINUS SURGERY  1978   sinus surgery done at [place] Right 12/2011   [facility], R for RCC   rectal fistula repair  1980s   TEE WITHOUT CARDIOVERSION N/A 07/05/2015   Procedure: TRANSESOPHAGEAL ECHOCARDIOGRAM (TEE);  Surgeon: [name], MD;  Location: [facility];  Service: Open Heart Surgery;  Laterality: N/A;    Current Medications: Current Meds  Medication Sig   aspirin EC 81 MG tablet Take 81 mg by mouth every evening. Swallow whole.   cholecalciferol (VITAMIN D) 25 MCG (1000 UNIT) tablet Take 1,000 Units by mouth daily.   fluticasone (FLONASE) 50 MCG/ACT nasal spray INHALE 2 SPRAYS INTO EACH NOSTRIL EVERY DAY.   Multiple Vitamin (MULTIVITAMIN WITH MINERALS) TABS tablet Take 1 tablet by mouth daily.   Omega-3 Fatty Acids (FISH OIL) 1000 MG CAPS Take 1 capsule (1,000 mg total) by mouth  daily.   sildenafil (REVATIO) 20 MG tablet Take 3-5 tablets as needed before sexual activity   telmisartan (MICARDIS) 80 MG tablet TAKE 1/2 TABLET BY MOUTH TWICE A DAY     Allergies:   Carvedilol and Metoprolol   Social History   Socioeconomic History   Marital status: Single    Spouse name: Not on file   Number of children: Not on file   Years of education: Not on file   Highest education level: Not on file  Occupational History   Not on file  Tobacco Use   Smoking status: Never   Smokeless tobacco: Never  Vaping Use   Vaping Use: Never used  Substance and Sexual Activity   Alcohol use: Not Currently   Drug use: Never    Sexual activity: Yes  Other Topics Concern   Not on file  Social History Narrative   Caffeine: occasional   Lives alone, 1 cat   Occupation: farming business   Edu: BS   Activity: bikes 3-4x/wk about 1 hour   Diet: good water, fruits/vegetables daily, steak 1x/wk, fish 2x/wk   Social Determinants of Health   Financial Resource Strain: Low Risk  (01/22/2020)   Overall Financial Resource Strain (CARDIA)    Difficulty of Paying Living Expenses: Not hard at all  Food Insecurity: No Food Insecurity (01/22/2020)   Hunger Vital Sign    Worried About Running Out of Food in the Last Year: Never true    Ran Out of Food in the Last Year: Never true  Transportation Needs: No Transportation Needs (01/22/2020)   PRAPARE - Hydrologist (Medical): No    Lack of Transportation (Non-Medical): No  Physical Activity: Insufficiently Active (01/22/2020)   Exercise Vital Sign    Days of Exercise per Week: 4 days    Minutes of Exercise per Session: 20 min  Stress: No Stress Concern Present (01/22/2020)   [place]    Feeling of Stress : Not at all  Social Connections: Not on file     Family History: The patient's family history includes CAD in his maternal grandfather; Cancer in his paternal aunt, paternal aunt, and paternal grandmother; Colon polyps in his brother and father; Coronary artery disease in his maternal uncle; Crohn's disease in his father; Diabetes in his father; Heart disease in his father and mother; Hypertension in his mother. There is no history of Stroke.  ROS:   Please see the history of present illness.    All other systems reviewed and are negative.  EKGs/Labs/Other Studies Reviewed:    The following studies were reviewed today:  04/23/2022 Zio Patient had a min HR of 53 bpm, max HR of 133 bpm, and avg HR of 75 bpm. Predominant underlying rhythm was Sinus Rhythm, Bundle Branch  Block/IVCD was present, 1 run of Ventricular Tachycardia occurred lasting 4 beats with a max rate of 133 bpm (avg 125  bpm). Isolated SVEs were rare (<1.0%), SVE Couplets were rare (<1.0%), and SVE Triplets were rare (<1.0%). Isolated VEs were frequent (23.6%, [id number]), VE Couplets were rare (<1.0%, 396), and VE Triplets were rare (<1.0%, 37). Ventricular Bigeminy and  Trigeminy were present.  04/02/2022 ECG shows sinus with frequent PVCs. PVCs have a left superior axis and are positive in V2-V6.  EKG:  The ekg ordered today demonstrates sinus rhythm with frequent monomorphic PVCs.  PVCs have a superior axis and a precordial transition in V2.   Recent  Labs: 03/28/2022: ALT 12; BUN 24; Creatinine, Ser 1.37; Hemoglobin 14.9; Platelets 157.0; Potassium 4.1; Sodium 139  Recent Lipid Panel    Component Value Date/Time   CHOL 175 03/28/2022 0740   TRIG 168.0 (H) 03/28/2022 0740   TRIG 131 06/21/2011 0000   HDL 30.50 (L) 03/28/2022 0740   CHOLHDL 6 03/28/2022 0740   VLDL 33.6 03/28/2022 0740   LDLCALC 111 (H) 03/28/2022 0740   LDLDIRECT 118 06/21/2011 0000    Physical Exam:    VS:  BP (!) 142/92   Pulse 70   Ht 5' 9.25" (1.759 m)   Wt 224 lb (101.6 kg)   SpO2 98%   BMI 32.84 kg/m     Wt Readings from Last 3 Encounters:  05/30/22 224 lb (101.6 kg)  05/15/22 225 lb 12.8 oz (102.4 kg)  04/04/22 222 lb 2 oz (100.8 kg)     GEN:  Well nourished, well developed in no acute distress HEENT: Normal NECK: No JVD; No carotid bruits LYMPHATICS: No lymphadenopathy CARDIAC: RRR, no murmurs, rubs, gallops RESPIRATORY:  Clear to auscultation without rales, wheezing or rhonchi  ABDOMEN: Soft, non-tender, non-distended MUSCULOSKELETAL:  No edema; No deformity  SKIN: Warm and dry NEUROLOGIC:  Alert and oriented x 3 PSYCHIATRIC:  Normal affect       ASSESSMENT:    1. PVC's (premature ventricular contractions)   2. S/P aortic valve replacement   3. Paroxysmal atrial fibrillation (HCC)   4.  Chronic combined systolic and diastolic heart failure (HCC)    PLAN:    In order of problems listed above:  #PVC's High burden on recent monitor. Does not tolerate beta blockers Amio may not be the best option given imaging findings c/w cirrhosis. If EF normal on echo, plan to start cardizem to see if this helps the PVC burden.  #s/p AVR/Bentall procedure in 2017 Repeat echo  #pAF No AF on recent monitor  #hx HFrEF, now recovered EF NYHA I. Warm and dry on exam. Repeat echo as above.   Follow up 6 months or sooner based on echo results.   Medication Adjustments/Labs and Tests Ordered: Current medicines are reviewed at length with the patient today.  Concerns regarding medicines are outlined above.  Orders Placed This Encounter  Procedures   EKG 12-Lead   ECHOCARDIOGRAM COMPLETE   No orders of the defined types were placed in this encounter.    Signed, Hilton Cork. Quentin Ore, MD, [facility], [facility] 05/30/2022 9:29 AM    Electrophysiology [facility]

## 2022-05-30 ENCOUNTER — Ambulatory Visit: Payer: Medicare Other | Attending: Cardiology | Admitting: Cardiology

## 2022-05-30 ENCOUNTER — Encounter: Payer: Self-pay | Admitting: Cardiology

## 2022-05-30 VITALS — BP 142/92 | HR 70 | Ht 69.25 in | Wt 224.0 lb

## 2022-05-30 DIAGNOSIS — I480 Paroxysmal atrial fibrillation: Secondary | ICD-10-CM | POA: Diagnosis not present

## 2022-05-30 DIAGNOSIS — Z952 Presence of prosthetic heart valve: Secondary | ICD-10-CM | POA: Insufficient documentation

## 2022-05-30 DIAGNOSIS — I493 Ventricular premature depolarization: Secondary | ICD-10-CM | POA: Insufficient documentation

## 2022-05-30 DIAGNOSIS — I5042 Chronic combined systolic (congestive) and diastolic (congestive) heart failure: Secondary | ICD-10-CM | POA: Insufficient documentation

## 2022-05-30 NOTE — Patient Instructions (Signed)
Medication Instructions:  Your physician recommends that you continue on your current medications as directed. Please refer to the Current Medication list given to you today.  *If you need a refill on your cardiac medications before your next appointment, please call your pharmacy*  Testing/Procedures: Your physician has requested that you have an echocardiogram. Echocardiography is a painless test that uses sound waves to create images of your heart. It provides your doctor with information about the size and shape of your heart and how well your heart's chambers and valves are working. This procedure takes approximately one hour. There are no restrictions for this procedure. Please do NOT wear cologne, perfume, aftershave, or lotions (deodorant is allowed). Please arrive 15 minutes prior to your appointment time.  Follow-Up: At [facility], you and your health needs are our priority.  As part of our continuing mission to provide you with exceptional heart care, we have created designated Provider Care Teams.  These Care Teams include your primary Cardiologist (physician) and Advanced Practice Providers (APPs -  Physician Assistants and Nurse Practitioners) who all work together to provide you with the care you need, when you need it.  Your next appointment:   6 month(s)  The format for your next appointment:   In Person  Provider:   You may see Vickie Epley, MD or one of the following Advanced Practice Providers on your designated Care Team:   Tommye Standard, [place] Legrand Como "Jonni Sanger" Chalmers Cater, [place]     Important Information About Sugar

## 2022-07-20 ENCOUNTER — Ambulatory Visit: Payer: Medicare Other | Attending: Cardiology

## 2022-07-20 DIAGNOSIS — I493 Ventricular premature depolarization: Secondary | ICD-10-CM

## 2022-07-20 DIAGNOSIS — Z952 Presence of prosthetic heart valve: Secondary | ICD-10-CM | POA: Diagnosis not present

## 2022-07-20 LAB — ECHOCARDIOGRAM COMPLETE
AR max vel: 2.5 cm2
AV Area VTI: 2.05 cm2
AV Area mean vel: 1.78 cm2
AV Mean grad: 8 mmHg
AV Peak grad: 13.5 mmHg
Ao pk vel: 1.84 m/s
Area-P 1/2: 3.4 cm2
Calc EF: 45.5 %
S' Lateral: 3.9 cm
Single Plane A2C EF: 46 %
Single Plane A4C EF: 48.7 %

## 2022-07-31 DIAGNOSIS — H25093 Other age-related incipient cataract, bilateral: Secondary | ICD-10-CM | POA: Diagnosis not present

## 2022-08-29 ENCOUNTER — Encounter: Payer: Self-pay | Admitting: Cardiology

## 2022-08-29 ENCOUNTER — Ambulatory Visit: Payer: Medicare Other | Attending: Cardiology | Admitting: Cardiology

## 2022-08-29 VITALS — BP 142/80 | HR 77 | Ht 71.0 in | Wt 228.0 lb

## 2022-08-29 DIAGNOSIS — I493 Ventricular premature depolarization: Secondary | ICD-10-CM | POA: Diagnosis not present

## 2022-08-29 DIAGNOSIS — Z952 Presence of prosthetic heart valve: Secondary | ICD-10-CM | POA: Diagnosis not present

## 2022-08-29 DIAGNOSIS — I5042 Chronic combined systolic (congestive) and diastolic (congestive) heart failure: Secondary | ICD-10-CM | POA: Diagnosis not present

## 2022-08-29 MED ORDER — ENTRESTO 49-51 MG PO TABS: 1.0000 | ORAL_TABLET | Freq: Two times a day (BID) | ORAL | 3 refills | Status: DC

## 2022-08-29 MED ORDER — METOPROLOL SUCCINATE ER 25 MG PO TB24: 25.0000 mg | ORAL_TABLET | Freq: Every evening | ORAL | 3 refills | Status: DC

## 2022-08-29 NOTE — Patient Instructions (Addendum)
Medication Instructions:  Your physician has recommended you make the following change in your medication: 1) STOP taking telmisartan  2) START taking Entresto 49-51 mg twice daily  3) START taking Toprol XL (metoprolol succinate) 25 mg every evening  *If you need a refill on your cardiac medications before your next appointment, please call your pharmacy*  Follow-Up: At [facility], you and your health needs are our priority.  As part of our continuing mission to provide you with exceptional heart care, we have created designated Provider Care Teams.  These Care Teams include your primary Cardiologist (physician) and Advanced Practice Providers (APPs -  Physician Assistants and Nurse Practitioners) who all work together to provide you with the care you need, when you need it.  Your next appointment:   2 month(s)  Provider:   Lars Mage, MD   Please Honor Card patient is presenting for Carmie Kanner: [id number]; Juanna [id number]; [id number]: [facility]; [id number]

## 2022-08-29 NOTE — Progress Notes (Signed)
  Electrophysiology Office Follow up Visit Note:    Date:  08/29/2022   ID:  Aaron Cole, DOB 1951/06/13, MRN [id number]  PCP:  [name], MD  [facility] Cardiologist:  Sinclair Grooms, MD (Inactive)  [facility] Electrophysiologist:  Vickie Epley, MD    Interval History:    Aaron Cole is a 72 y.o. male who presents for a follow up visit.   Last seen May 30, 2022 for frequent PVCs.  The patient has a history of aortic valve replacement and Bentall in 2017.  At the last appointment an echo was ordered which was going to be used to guide medical therapy for his PVCs.  He has a documented allergy to carvedilol/metoprolol with diarrhea as the side effect.  He thinks the diarrhea may have actually been caused by dietary factors rather than the medication and wishes to retry it.     Past medical, surgical, social and family history were reviewed.  ROS:   Please see the history of present illness.    All other systems reviewed and are negative.  EKGs/Labs/Other Studies Reviewed:    The following studies were reviewed today:  July 20, 2022 echo shows an ejection fraction 40 to 45% RV function is normal Mild MR Magna pericardial valve present in the aortic position without stenosis     Physical Exam:    VS:  BP (!) 142/80   Pulse 77   Ht 5' 11"$  (1.803 m)   Wt 228 lb (103.4 kg)   SpO2 97%   BMI 31.80 kg/m     Wt Readings from Last 3 Encounters:  08/29/22 228 lb (103.4 kg)  05/30/22 224 lb (101.6 kg)  05/15/22 225 lb 12.8 oz (102.4 kg)     GEN:  Well nourished, well developed in no acute distress CARDIAC: RRR, no murmurs, rubs, gallops RESPIRATORY:  Clear to auscultation without rales, wheezing or rhonchi       ASSESSMENT:    1. PVC's (premature ventricular contractions)   2. Chronic combined systolic and diastolic heart failure ([place])   3. S/P aortic valve replacement    PLAN:    In order of problems listed  above:   #Frequent PVCs Associated with a reduced ejection fraction.  Burden on monitor was greater than 20%.  Cirrhosis makes amiodarone not a good option.  Structural heart disease prevents class Ic use.  We discussed retrying a beta-blocker and he is interested in doing this first which I think is a good idea.  Will start metoprolol succinate 25 mg by mouth once daily at night.  If he tolerates the drug then I would recommend we uptitrate this to see if we can suppress his PVC.  If he does not tolerate the metoprolol and has recurrent diarrhea, would stop the drug and start mexiletine 150 mg by mouth twice daily.  Not a good candidate for invasive EP procedures to target PVCs given presence of a prosthetic aortic valve and probable location of PVC.  #Chronic systolic heart failure Ejection fraction 40% on recent echo. Stop telmisartan and start Entresto. Start metoprolol as above   Follow-up with APP in 8 weeks.   Signed, Lars Mage, MD, [facility], [facility] 08/29/2022 1:47 PM    Electrophysiology [facility]

## 2022-09-24 DIAGNOSIS — I10 Essential (primary) hypertension: Secondary | ICD-10-CM | POA: Diagnosis not present

## 2022-09-24 DIAGNOSIS — N183 Chronic kidney disease, stage 3 unspecified: Secondary | ICD-10-CM | POA: Diagnosis not present

## 2022-09-26 DIAGNOSIS — N183 Chronic kidney disease, stage 3 unspecified: Secondary | ICD-10-CM | POA: Diagnosis not present

## 2022-09-26 DIAGNOSIS — C649 Malignant neoplasm of unspecified kidney, except renal pelvis: Secondary | ICD-10-CM | POA: Diagnosis not present

## 2022-09-26 DIAGNOSIS — I10 Essential (primary) hypertension: Secondary | ICD-10-CM | POA: Diagnosis not present

## 2022-09-27 ENCOUNTER — Telehealth: Payer: Self-pay | Admitting: Cardiology

## 2022-09-27 NOTE — Telephone Encounter (Signed)
Patient called and said that Dr. Quentin Ore had prescribed him 2 new medications and they make him very weak and dizzy...would like to know if there are any other medication. Patient would like a call back

## 2022-09-27 NOTE — Telephone Encounter (Signed)
Spoke with the patient who states that he has been having some dizzy spells and feeling weak lately. He states that his blood pressure and heart rate have been running low. He went to his nephrologist yesterday and BP was 115.58 and HR was 57. States that nephrologist was concerned. Advised to hold his metoprolol for now and I will let Dr. Quentin Ore know and call back with further recommendations. Advised to monitor HR and BP and home, stay hydrated and change positions slowly. Patient verbalized understanding.

## 2022-10-01 MED ORDER — METOPROLOL SUCCINATE ER 25 MG PO TB24: 12.5000 mg | ORAL_TABLET | Freq: Two times a day (BID) | ORAL | 3 refills | Status: DC

## 2022-10-01 NOTE — Telephone Encounter (Signed)
Spoke with the patient and advised him on recommendations from Dr. Quentin Ore. Patient thinks that he is still not going to be able to tolerate it. He will give it a try and call us back and let us know.

## 2022-10-31 ENCOUNTER — Ambulatory Visit: Payer: Medicare Other | Attending: Cardiology | Admitting: Cardiology

## 2022-10-31 ENCOUNTER — Encounter: Payer: Self-pay | Admitting: Cardiology

## 2022-10-31 VITALS — BP 122/84 | HR 72 | Ht 71.0 in | Wt 227.0 lb

## 2022-10-31 DIAGNOSIS — I5042 Chronic combined systolic (congestive) and diastolic (congestive) heart failure: Secondary | ICD-10-CM | POA: Insufficient documentation

## 2022-10-31 DIAGNOSIS — Z952 Presence of prosthetic heart valve: Secondary | ICD-10-CM | POA: Diagnosis not present

## 2022-10-31 DIAGNOSIS — I493 Ventricular premature depolarization: Secondary | ICD-10-CM | POA: Diagnosis not present

## 2022-10-31 NOTE — Progress Notes (Signed)
  Electrophysiology Office Follow up Visit Note:    Date:  10/31/2022   ID:  Aaron Cole, DOB 05/24/1951, MRN [id number]  PCP:  Eustaquio Boyden, MD  [facility] Cardiologist:  Lesleigh Noe, MD (Inactive)  [facility] Electrophysiologist:  Lanier Prude, MD    Interval History:    Aaron Cole is a 72 y.o. male who presents for a follow up visit.   I last saw the patient August 29, 2022 for a cardiomyopathy and very frequent PVCs.  I started him on Entresto and metoprolol.  He did not tolerate metoprolol in any dose variation.  He feels better and thinks he has had a significant improvement in his PVC burden.  No syncope or presyncope.       Past medical, surgical, social and family history were reviewed.  ROS:   Please see the history of present illness.    All other systems reviewed and are negative.  EKGs/Labs/Other Studies Reviewed:    The following studies were reviewed today:    Physical Exam:    VS:  BP 122/84   Pulse 72   Ht 5\' 11"  (1.803 m)   Wt 227 lb (103 kg)   SpO2 99%   BMI 31.66 kg/m     Wt Readings from Last 3 Encounters:  10/31/22 227 lb (103 kg)  08/29/22 228 lb (103.4 kg)  05/30/22 224 lb (101.6 kg)     GEN:  Well nourished, well developed in no acute distress CARDIAC: RRR, no murmurs, rubs, gallops RESPIRATORY:  Clear to auscultation without rales, wheezing or rhonchi       ASSESSMENT:    1. Chronic combined systolic and diastolic heart failure (HCC)   2. S/P aortic valve replacement   3. PVC's (premature ventricular contractions)    PLAN:    In order of problems listed above:  #Chronic combined systolic and diastolic heart failure NYHA class II.  Warm dry on exam.  Continue Entresto.  Does not tolerate beta-blocker.  #Frequent PVCs Seems to be improved on Entresto.  I will have him wear a monitor for 3 days in 90 days and then he can touch base with an APP after that visit.   Follow-up in 3 to 4 months  after he wears a 3-day ZIO monitor to reassess his PVC burden.       Signed, Steffanie Dunn, MD, [facility], [facility] 10/31/2022 9:49 PM    Electrophysiology [facility]

## 2022-10-31 NOTE — Patient Instructions (Signed)
Medication Instructions:  Your physician recommends that you continue on your current medications as directed. Please refer to the Current Medication list given to you today.  *If you need a refill on your cardiac medications before your next appointment, please call your pharmacy*   Testing/Procedures: Your physician has recommended that you wear an event monitor in 3 months prior to your follow up visit.    Follow-Up: At [facility], you and your health needs are our priority.  As part of our continuing mission to provide you with exceptional heart care, we have created designated Provider Care Teams.  These Care Teams include your primary Cardiologist (physician) and Advanced Practice Providers (APPs -  Physician Assistants and Nurse Practitioners) who all work together to provide you with the care you need, when you need it.  Your next appointment:   3 month(s)  Provider:  Sherie Don, NP  Other Instructions Aaron Cole- Long Term Monitor Instructions  Your physician has requested you wear a ZIO patch monitor for 3 days.  This is a single patch monitor. [employer] supplies one patch monitor per enrollment. Additional stickers are not available. Please do not apply patch if you will be having a Nuclear Stress Test,  Echocardiogram, Cardiac CT, MRI, or Chest Xray during the period you would be wearing the  monitor. The patch cannot be worn during these tests. You cannot remove and re-apply the  ZIO XT patch monitor.  Your ZIO patch monitor will be mailed 3 day USPS to your address on file. It may take 3-5 days  to receive your monitor after you have been enrolled.  Once you have received your monitor, please review the enclosed instructions. Your monitor  has already been registered assigning a specific monitor serial # to you.  Billing and Patient Assistance Program Information  We have supplied [employer] with any of your insurance information on file for billing  purposes. [employer] offers a sliding scale Patient Assistance Program for patients that do not have  insurance, or whose insurance does not completely cover the cost of the ZIO monitor.  You must apply for the Patient Assistance Program to qualify for this discounted rate.  To apply, please call [employer] at [phone], select option 4, select option 2, ask to apply for  Patient Assistance Program. Meredeth Ide will ask your household income, and how many people  are in your household. They will quote your out-of-pocket cost based on that information.  [employer] will also be able to set up a 24-month, interest-free payment plan if needed.  Applying the monitor   Shave hair from upper left chest.  Hold abrader disc by orange tab. Rub abrader in 40 strokes over the upper left chest as  indicated in your monitor instructions.  Clean area with 4 enclosed alcohol pads. Let dry.  Apply patch as indicated in monitor instructions. Patch will be placed under collarbone on left  side of chest with arrow pointing upward.  Rub patch adhesive wings for 2 minutes. Remove white label marked "1". Remove the white  label marked "2". Rub patch adhesive wings for 2 additional minutes.  While looking in a mirror, press and release button in center of patch. A small green light will  flash 3-4 times. This will be your only indicator that the monitor has been turned on.  Do not shower for the first 24 hours. You may shower after the first 24 hours.  Press the button if you feel a symptom. You will hear a  small click. Record Date, Time and  Symptom in the Patient Logbook.  When you are ready to remove the patch, follow instructions on the last 2 pages of Patient  Logbook. Stick patch monitor onto the last page of Patient Logbook.  Place Patient Logbook in the blue and white box. Use locking tab on box and tape box closed  securely. The blue and white box has prepaid postage on it. Please place it in the mailbox as  soon  as possible. Your physician should have your test results approximately 7 days after the  monitor has been mailed back to [facility].  Call [facility] Customer Care at [phone] if you have questions regarding  your ZIO XT patch monitor. Call them immediately if you see an orange light blinking on your  monitor.  If your monitor falls off in less than 4 days, contact our Monitor department at [phone].  If your monitor becomes loose or falls off after 4 days call [employer] at [phone] for  suggestions on securing your monitor

## 2022-11-16 ENCOUNTER — Ambulatory Visit
Admission: EM | Admit: 2022-11-16 | Discharge: 2022-11-16 | Disposition: A | Discharge status: Home / Self Care | Payer: Medicare Other | Attending: Emergency Medicine | Admitting: Emergency Medicine

## 2022-11-16 DIAGNOSIS — J0110 Acute frontal sinusitis, unspecified: Secondary | ICD-10-CM | POA: Diagnosis not present

## 2022-11-16 DIAGNOSIS — J209 Acute bronchitis, unspecified: Secondary | ICD-10-CM

## 2022-11-16 MED ORDER — AMOXICILLIN-POT CLAVULANATE 875-125 MG PO TABS: 1.0000 | ORAL_TABLET | Freq: Two times a day (BID) | ORAL | 0 refills | Status: DC

## 2022-11-16 NOTE — Discharge Instructions (Addendum)
Take the Augmentin as directed.  Follow up with your primary care provider on Monday.  Go to the emergency department if you have worsening symptoms

## 2022-11-16 NOTE — ED Triage Notes (Signed)
Patient to Urgent Care with complaints of Cough/ chest congestion/ fevers. Symptoms started Monday.   Cough was productive this morning but has been dry since. Taking nyquil/ throat lozenges.

## 2022-11-16 NOTE — ED Provider Notes (Signed)
Aaron Cole    CSN: [id number] Arrival date & time: 11/16/22  1756      History   Chief Complaint Chief Complaint  Patient presents with   Fever    HPI Aaron Cole is a 72 y.o. male.  Patient presents with 4-day history of congestion and cough.  He reports fever of 103 on the first day of his symptoms; no fever in the last 48 hours.  His cough was productive this morning but has been nonproductive since.  He denies chest pain, shortness of breath, or other symptoms.  His medical history includes hypertension, CAD, heart failure, renal cell carcinoma, status post nephrectomy, cirrhosis.  The history is provided by the patient and medical records.    Past Medical History:  Diagnosis Date   CAD (coronary artery disease) 2013   per CT scan  - brings CD of CT, no report - will need [facility] records of CT scan report   Cirrhosis of liver (HCC) 01/10/2019   By [place] 03/2020   CKD (chronic kidney disease) stage 3, GFR 30-59 ml/min (HCC) 2015   iatrogenic (decreased nephron mass) established with Dr. Arvin Collard [facility])   Diverticulosis    by CT scan   Hepatitis C virus infection without hepatic coma 03/02/2019   History of hepatitis B virus infection    spontaneously cleared   HTN (hypertension)    Renal cell carcinoma (HCC) 2013   clear cell s/p R nephrectomy   S/p nephrectomy 2013   [facility]   Sinus headache    recurrent    Patient Active Problem List   Diagnosis Date Noted   Dysphagia 07/21/2019   History of hepatitis B virus infection    Hepatitis C virus infection without hepatic coma 03/02/2019   Polyp of sigmoid colon    Cirrhosis of liver without ascites (HCC) 01/10/2019   Advanced care planning/counseling discussion 01/08/2019   S/P ascending aortic aneurysm repair 07/26/2015   S/P aortic valve replacement 07/26/2015   Atrial fibrillation (HCC) [I48.91] 07/18/2015   Encounter for therapeutic drug monitoring 07/18/2015   HLD (hyperlipidemia) 05/05/2015   CKD (chronic  kidney disease) stage 3, GFR 30-59 ml/min (HCC)    Right hydrocele 10/02/2012   Medicare annual wellness visit, subsequent 08/01/2012   Sinus headache    HTN (hypertension)    History of renal cell carcinoma    S/p nephrectomy     Past Surgical History:  Procedure Laterality Date   BENTALL PROCEDURE N/A 07/05/2015   Procedure: BENTALL PROCEDURE UTILIZING A 28 X 10 MM WOVEN DOUBLE VELOUR VASCULAR GRAFT AND A GELWEAVE WOVEN VASCULAR GRAFT. 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.;  Surgeon: Alleen Borne, MD;  [facility]: [facility] OR;  Service: Open Heart Surgery;  Laterality: N/A;   COLONOSCOPY  11/2008   WNL records in chart [facility])   COLONOSCOPY WITH PROPOFOL N/A 02/24/2019   TA, HP, rpt 5 yrs ([facility])   CORONARY ANGIOPLASTY     ESOPHAGOGASTRODUODENOSCOPY (EGD) WITH PROPOFOL N/A 08/04/2020   Procedure: ESOPHAGOGASTRODUODENOSCOPY (EGD) WITH PROPOFOL;  Surgeon: Wyline Mood, MD;  Location: [facility] ENDOSCOPY;  Service: Gastroenterology;  Laterality: N/A;   HYDROCELE EXCISION Right 02/05/2020   Procedure: HYDROCELECTOMY ADULT;  Surgeon: Sondra Come, MD;  Location: [facility] ORS;  Service: Urology;  Laterality: Right;   NASAL SINUS SURGERY  1978   [facility]   NEPHRECTOMY Right 12/2011   [facility], R for RCC   rectal fistula repair  1980s   TEE WITHOUT CARDIOVERSION N/A 07/05/2015   Procedure: TRANSESOPHAGEAL ECHOCARDIOGRAM (TEE);  Surgeon: Alleen Borne, MD;  Location: [facility] OR;  Service: Open Heart Surgery;  Laterality: N/A;       Home Medications    Prior to Admission medications   Medication Sig Start Date End Date Taking? Authorizing Provider  amoxicillin-clavulanate (AUGMENTIN) 875-125 MG tablet Take 1 tablet by mouth every 12 (twelve) hours. 11/16/22  Yes Mickie Bail, NP  aspirin EC 81 MG tablet Take 81 mg by mouth every evening. Swallow whole.    [provider]  cholecalciferol (VITAMIN D) 25 MCG (1000 UNIT) tablet Take 1,000 Units  by mouth daily.    [provider]  fluticasone (FLONASE) 50 MCG/ACT nasal spray INHALE 2 SPRAYS INTO EACH NOSTRIL EVERY DAY. 06/27/16   Eustaquio Boyden, MD  Multiple Vitamin (MULTIVITAMIN WITH MINERALS) TABS tablet Take 1 tablet by mouth daily.    [provider]  Omega-3 Fatty Acids (FISH OIL) 1000 MG CAPS Take 1 capsule (1,000 mg total) by mouth daily. 10/25/15   Lyn Records, MD  sacubitril-valsartan (ENTRESTO) 49-51 MG Take 1 tablet by mouth 2 (two) times daily. 08/29/22   Lanier Prude, MD    Family History Family History  Problem Relation Age of Onset   Cancer Paternal Grandmother        throat   Heart disease Mother    Hypertension Mother    Heart disease Father    Diabetes Father    Colon polyps Father    Crohn's disease Father    Coronary artery disease Maternal Uncle        MI   Cancer Paternal Aunt        breast    Cancer Paternal Aunt        brain   Colon polyps Brother    CAD Maternal Grandfather    Stroke Neg Hx     Social History Social History   Tobacco Use   Smoking status: Never   Smokeless tobacco: Never  Vaping Use   Vaping Use: Never used  Substance Use Topics   Alcohol use: Not Currently   Drug use: Never     Allergies   Carvedilol and Metoprolol   Review of Systems Review of Systems  Constitutional:  Positive for fever. Negative for chills.  HENT:  Positive for congestion. Negative for sore throat.   Respiratory:  Positive for cough. Negative for shortness of breath.   Cardiovascular:  Negative for chest pain and palpitations.     Physical Exam Triage Vital Signs ED Triage Vitals  Enc Vitals Group     BP 11/16/22 1900 (!) 153/71     Pulse Rate 11/16/22 1900 68     Resp 11/16/22 1900 18     Temp 11/16/22 1905 98.9 F (37.2 C)     Temp src --      SpO2 11/16/22 1900 99 %     Weight --      Height --      Head Circumference --      Peak Flow --      Pain Score 11/16/22 1858 0     Pain Loc --       Pain Edu? --      Excl. in GC? --    No data found.  Updated Vital Signs BP (!) 153/71   Pulse 68   Temp 98.9 F (37.2 C)   Resp 18   SpO2 99%  Visual Acuity Right Eye Distance:   Left Eye Distance:   Bilateral Distance:    Right Eye Near:   Left Eye Near:    Bilateral Near:     Physical Exam Vitals and nursing note reviewed.  Constitutional:      General: He is not in acute distress.    Appearance: He is well-developed. He is not ill-appearing.  HENT:     Right Ear: Tympanic membrane normal.     Left Ear: Tympanic membrane normal.     Nose: Rhinorrhea present.     Mouth/Throat:     Mouth: Mucous membranes are moist.     Pharynx: Oropharynx is clear.  Cardiovascular:     Rate and Rhythm: Normal rate and regular rhythm.     Heart sounds: Normal heart sounds.  Pulmonary:     Effort: Pulmonary effort is normal. No respiratory distress.     Breath sounds: Normal breath sounds.  Musculoskeletal:     Cervical back: Neck supple.  Skin:    General: Skin is warm and dry.  Neurological:     Mental Status: He is alert.  Psychiatric:        Mood and Affect: Mood normal.        Behavior: Behavior normal.      UC Treatments / Results  Labs (all labs ordered are listed, but only abnormal results are displayed) Labs Reviewed - No data to display  EKG   Radiology No results found.  Procedures Procedures (including critical care time)  Medications Ordered in UC Medications - No data to display  Initial Impression / Assessment and Plan / UC Course  I have reviewed the triage vital signs and the nursing notes.  Pertinent labs & imaging results that were available during my care of the patient were reviewed by me and considered in my medical decision making (see chart for details).   Acute bronchitis and sinusitis.  Afebrile.  Lungs are clear at this time.  O2 sat 99% on room air.  Treating with Augmentin.  Instructed patient to follow-up with his PCP on Monday.   ED precautions given.  Education provided on bronchitis and sinusitis.  Patient agrees to plan of care.   Final Clinical Impressions(s) / UC Diagnoses   Final diagnoses:  Acute bronchitis, unspecified organism  Acute non-recurrent frontal sinusitis     Discharge Instructions      Take the Augmentin as directed.  Follow up with your primary care provider on Monday.  Go to the emergency department if you have worsening symptoms      ED Prescriptions     Medication Sig Dispense Auth. Provider   amoxicillin-clavulanate (AUGMENTIN) 875-125 MG tablet Take 1 tablet by mouth every 12 (twelve) hours. 14 tablet Mickie Bail, NP      PDMP not reviewed this encounter.   Mickie Bail, NP 11/16/22 [phone]

## 2022-12-05 ENCOUNTER — Ambulatory Visit: Payer: Medicare Other | Admitting: Cardiology

## 2023-01-07 ENCOUNTER — Other Ambulatory Visit: Payer: Self-pay

## 2023-01-07 DIAGNOSIS — I480 Paroxysmal atrial fibrillation: Secondary | ICD-10-CM

## 2023-01-07 DIAGNOSIS — I493 Ventricular premature depolarization: Secondary | ICD-10-CM

## 2023-01-31 ENCOUNTER — Ambulatory Visit: Payer: BLUE CROSS/BLUE SHIELD | Attending: Cardiology

## 2023-01-31 ENCOUNTER — Telehealth: Payer: Self-pay

## 2023-01-31 ENCOUNTER — Ambulatory Visit: Payer: BLUE CROSS/BLUE SHIELD | Admitting: Cardiology

## 2023-01-31 ENCOUNTER — Other Ambulatory Visit: Payer: Self-pay

## 2023-01-31 DIAGNOSIS — I480 Paroxysmal atrial fibrillation: Secondary | ICD-10-CM

## 2023-01-31 NOTE — Progress Notes (Deleted)
Cardiology Office Note Date:  01/31/2023  Patient ID:  Aaron Cole, Aaron Cole October 26, 1950, MRN [id number] PCP:  Eustaquio Boyden, MD  Cardiologist:  Lesleigh Noe, MD (Inactive) Electrophysiologist: Lanier Prude, MD  ***refresh   Chief Complaint: ***  History of Present Illness: Aaron Cole is a 72 y.o. male with PMH notable for HCM, PVC, s/p AVR; seen today for Lanier Prude, MD for {VISITTYPE:28148}    Device Information: ***  AAD History: ***  Past Medical History:  Diagnosis Date   CAD (coronary artery disease) 2013   per CT scan  - brings CD of CT, no report - will need [facility] records of CT scan report   Cirrhosis of liver (HCC) 01/10/2019   By [place] 03/2020   CKD (chronic kidney disease) stage 3, GFR 30-59 ml/min (HCC) 2015   iatrogenic (decreased nephron mass) established with Dr. Arvin Collard [facility])   Diverticulosis    by CT scan   Hepatitis C virus infection without hepatic coma 03/02/2019   History of hepatitis B virus infection    spontaneously cleared   HTN (hypertension)    Renal cell carcinoma (HCC) 2013   clear cell s/p R nephrectomy   S/p nephrectomy 2013   [facility]   Sinus headache    recurrent    Past Surgical History:  Procedure Laterality Date   BENTALL PROCEDURE N/A 07/05/2015   Procedure: BENTALL PROCEDURE UTILIZING A 28 X 10 MM WOVEN DOUBLE VELOUR VASCULAR GRAFT AND A GELWEAVE WOVEN VASCULAR GRAFT. 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.;  Surgeon: Alleen Borne, MD;  Location: [facility] OR;  Service: Open Heart Surgery;  Laterality: N/A;   COLONOSCOPY  11/2008   WNL records in chart [facility])   COLONOSCOPY WITH PROPOFOL N/A 02/24/2019   TA, HP, rpt 5 yrs ([facility])   CORONARY ANGIOPLASTY     ESOPHAGOGASTRODUODENOSCOPY (EGD) WITH PROPOFOL N/A 08/04/2020   Procedure: ESOPHAGOGASTRODUODENOSCOPY (EGD) WITH PROPOFOL;  Surgeon: Wyline Mood, MD;  Location: [facility] ENDOSCOPY;  Service: Gastroenterology;  Laterality: N/A;    HYDROCELE EXCISION Right 02/05/2020   Procedure: HYDROCELECTOMY ADULT;  Surgeon: Sondra Come, MD;  Location: [facility] ORS;  Service: Urology;  Laterality: Right;   NASAL SINUS SURGERY  1978   [facility]   NEPHRECTOMY Right 12/2011   [facility], R for RCC   rectal fistula repair  1980s   TEE WITHOUT CARDIOVERSION N/A 07/05/2015   Procedure: TRANSESOPHAGEAL ECHOCARDIOGRAM (TEE);  Surgeon: Alleen Borne, MD;  Location: [facility] OR;  Service: Open Heart Surgery;  Laterality: N/A;    Current Outpatient Medications  Medication Instructions   amoxicillin-clavulanate (AUGMENTIN) 875-125 MG tablet 1 tablet, Oral, Every 12 hours   aspirin EC 81 mg, Oral, Every evening, Swallow whole.    cholecalciferol (VITAMIN D3) 1,000 Units, Oral, Daily   Fish Oil 1,000 mg, Oral, Daily   fluticasone (FLONASE) 50 MCG/ACT nasal spray INHALE 2 SPRAYS INTO EACH NOSTRIL EVERY DAY.   Multiple Vitamin (MULTIVITAMIN WITH MINERALS) TABS tablet 1 tablet, Oral, Daily   sacubitril-valsartan (ENTRESTO) 49-51 MG 1 tablet, Oral, 2 times daily    Social History:  The patient  reports that he has never smoked. He has never used smokeless tobacco. He reports that he does not currently use alcohol. He reports that he does not use drugs.   Family History:  *** include only if pertinent The patient's family history includes CAD in his maternal grandfather; Cancer in his paternal  aunt, paternal aunt, and paternal grandmother; Colon polyps in his brother and father; Coronary artery disease in his maternal uncle; Crohn's disease in his father; Diabetes in his father; Heart disease in his father and mother; Hypertension in his mother.***  ROS:  Please see the history of present illness. All other systems are reviewed and otherwise negative.   PHYSICAL EXAM: *** VS:  There were no vitals taken for this visit. BMI: There is no height or weight on file to calculate BMI.  GEN- The patient is well appearing, alert and oriented x 3  today.   Lungs- Clear to ausculation bilaterally, normal work of breathing.  Heart- {Blank single:19197::"Regular","Irregularly irregular"} rate and rhythm, no murmurs, rubs or gallops Extremities- {EDEMA LEVEL:28147::"No"} peripheral edema, warm, dry Skin-  *** device pocket well-healed, no tethering   Device interrogation done today and reviewed by myself:  Battery *** Lead thresholds, impedence, sensing stable *** *** episodes *** changes made today  EKG {ACTION; IS/IS MVH:[id number]} ordered. Personal review of EKG from {Blank single:19197::"today","***"} shows:  ***        Recent Labs: 03/28/2022: ALT 12; BUN 24; Creatinine, Ser 1.37; Hemoglobin 14.9; Platelets 157.0; Potassium 4.1; Sodium 139  03/28/2022: Cholesterol 175; HDL 30.50; LDL Cholesterol 111; Total CHOL/HDL Ratio 6; Triglycerides 168.0; VLDL 33.6   CrCl cannot be calculated (Patient's most recent lab result is older than the maximum 21 days allowed.).   Wt Readings from Last 3 Encounters:  10/31/22 227 lb (103 kg)  08/29/22 228 lb (103.4 kg)  05/30/22 224 lb (101.6 kg)     Additional studies reviewed include: Previous EP, cardiology notes.     ASSESSMENT AND PLAN:  #) ***   #) ***   {Are you ordering a CV Procedure (e.g. stress test, cath, DCCV, TEE, etc)?   Press F2        :[id number]}   Current medicines are reviewed at length with the patient today.   The patient {ACTIONS; HAS/DOES NOT HAVE:19233} concerns regarding his medicines.  The following changes were made today:  {NONE DEFAULTED:18576}  Labs/ tests ordered today include: *** No orders of the defined types were placed in this encounter.    Disposition: Follow up with {EPMDS:28135} or EP APP {EPFOLLOW UP:28173}   Signed, Sherie Don, NP  01/31/23  1:48 PM  Electrophysiology [facility]

## 2023-01-31 NOTE — Telephone Encounter (Signed)
Called patient, I reordered heart monitor - advised patient to call back if he does not receive it. Patient verbalized understanding.   Cancelled appointment for today, and rescheduled for 03/07/2023.  Patient thankful for call back.

## 2023-01-31 NOTE — Telephone Encounter (Signed)
-----   Message from [place] Riddle sent at 01/31/2023  1:53 PM EDT ----- Regarding: monitor This patient needs a 3d monitor sent to him, and then follow-up with me after the monitor (so maybe 1 month from today? -- per CL's last note. It looks like monitor was ordered, but he says it wasn't sent.   Thanks, [employer]

## 2023-02-05 ENCOUNTER — Ambulatory Visit (INDEPENDENT_AMBULATORY_CARE_PROVIDER_SITE_OTHER): Payer: Medicare Other | Admitting: Nurse Practitioner

## 2023-02-05 ENCOUNTER — Encounter: Payer: Self-pay | Admitting: Nurse Practitioner

## 2023-02-05 VITALS — BP 120/82 | HR 70 | Temp 98.0°F | Ht 71.0 in | Wt 227.8 lb

## 2023-02-05 DIAGNOSIS — R21 Rash and other nonspecific skin eruption: Secondary | ICD-10-CM | POA: Diagnosis not present

## 2023-02-05 MED ORDER — MUPIROCIN 2 % EX OINT: 1.0000 | TOPICAL_OINTMENT | Freq: Two times a day (BID) | CUTANEOUS | 0 refills | Status: DC

## 2023-02-05 MED ORDER — METHYLPREDNISOLONE ACETATE 40 MG/ML IJ SUSP
40.0000 mg | Freq: Once | INTRAMUSCULAR | Status: AC
  Administered 2023-02-05: 40 mg via INTRAMUSCULAR

## 2023-02-05 NOTE — Assessment & Plan Note (Signed)
See clinical photo Depo-Medrol 40 mg IM x 1 dose today.  Mupirocin 2% twice daily for a week.  Follow-up if no improvement patient has had a history of renal cell carcinoma and nephrectomy approximately 12 years ago and is followed by specialist once a year.  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

## 2023-02-05 NOTE — Patient Instructions (Signed)
Nice to see you today We gave you a dose of steroids and I have sent in a cream to use twice a day for about a week Follow up if no improvement

## 2023-02-05 NOTE — Progress Notes (Signed)
   Acute Office Visit  Subjective:     Patient ID: EBUBECHUKWU LETBETTER, male    DOB: Apr 06, 1951, 72 y.o.   MRN: [id number]  Chief Complaint  Patient presents with   injury on arm    Pt states started 3-4 weeks ago. Bleeding on right arm. Pt states it wont heal and will itch sometimes.      HPI Patient is in today for skin lesion with a history of HTN, Afib, cirrhosis, renal cell carcinoma.   Started 3-4 weeks ago. Treated for chiggers with clear finger nail polish. States that it did itch in the beginning. 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 States that it will not heal. It will scab and will not heal.  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  He does live alone so only person in the house with a rash.   Review of Systems  Constitutional:  Negative for chills and fever.  Respiratory:  Negative for shortness of breath.   Cardiovascular:  Negative for chest pain.  Skin:  Positive for rash. Negative for itching.  Neurological:  Negative for headaches.  Psychiatric/Behavioral:  Negative for hallucinations and suicidal ideas.         Objective:    BP 120/82   Pulse 70   Temp 98 F (36.7 C) (Temporal)   Ht 5\' 11"  (1.803 m)   Wt 227 lb 12.8 oz (103.3 kg)   SpO2 96%   BMI 31.77 kg/m    Physical Exam Vitals and nursing note reviewed.  Constitutional:      Appearance: Normal appearance.  Cardiovascular:     Rate and Rhythm: Normal rate and regular rhythm.     Heart sounds: Normal heart sounds.  Pulmonary:     Effort: Pulmonary effort is normal.     Breath sounds: Normal breath sounds.  Neurological:     Mental Status: He is alert.     No results found for any visits on 02/05/23.      Assessment & Plan:   Problem List Items Addressed This Visit       Musculoskeletal and Integument   Rash - Primary    See clinical photo Depo-Medrol 40 mg IM x 1 dose today.  Mupirocin 2% twice daily for a week.   Follow-up if no improvement patient has had a history of renal cell carcinoma and nephrectomy approximately 12 years ago and is followed by specialist once a year.  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    Dispense:  22 g    Refill:  0    Order Specific Question:   Supervising Provider    Answer:   TOWER, MARNE A [[id number]    Return if symptoms worsen or fail to improve.  Audria Nine, NP

## 2023-02-06 DIAGNOSIS — I480 Paroxysmal atrial fibrillation: Secondary | ICD-10-CM | POA: Diagnosis not present

## 2023-02-15 DIAGNOSIS — I480 Paroxysmal atrial fibrillation: Secondary | ICD-10-CM | POA: Diagnosis not present

## 2023-03-07 ENCOUNTER — Ambulatory Visit: Payer: Medicare Other | Admitting: Cardiology

## 2023-03-11 ENCOUNTER — Other Ambulatory Visit: Payer: Self-pay

## 2023-03-11 ENCOUNTER — Ambulatory Visit (INDEPENDENT_AMBULATORY_CARE_PROVIDER_SITE_OTHER): Payer: Medicare Other | Admitting: Gastroenterology

## 2023-03-11 ENCOUNTER — Encounter: Payer: Self-pay | Admitting: Gastroenterology

## 2023-03-11 VITALS — BP 89/61 | HR 73 | Temp 98.7°F | Wt 224.6 lb

## 2023-03-11 DIAGNOSIS — K7460 Unspecified cirrhosis of liver: Secondary | ICD-10-CM

## 2023-03-11 NOTE — Progress Notes (Signed)
Wyline Mood MD, MRCP(U.K) [address]  [address]  [place], [place] [address]  Main: [phone]  Fax: [phone]   Primary Care Physician: Eustaquio Boyden, MD  Primary Gastroenterologist:  Dr. Wyline Mood   Chief Complaint  Patient presents with   cirrhosis of the liver    HPI: Aaron Cole is a 72 y.o. male Summary of history :   Initially referred and seen in 2020 for chronic hepatitis C.  Hepatitis B core total antibody positive.  Viral hepatitis and autoimmune hepatitis panel otherwise negative.  Hepatitis A total antibody positive.  He was treated for chronic hepatitis C and attained cure confirmed in 2021.  Ultrasound in 2021 showed features suggestive of liver cirrhosis.   02/24/2019: Colonoscopy: 3 mm polyp in the ascending colon, 5 mm polyp in the sigmoid colon resected diverticulosis of the colon nonbleeding internal hemorrhoids, the polyps were tubular adenomas times 1 repeat colonoscopy in 2025   08/04/2020: EGD: Medium sized hiatal hernia no esophageal varices 05/11/2022 right upper quadrant ultrasound cirrhosis no hepatic lesion 03/28/2022: Hemoglobin 14.9 platelet count of 157, INR 1.0, albumin 4.2 creatinine 1.37    Interval history  05/15/2022-03/11/2023   Well no concerns.  Sleeping well at night.  No NSAID use.  Having regular soft bowel movements.    Current Outpatient Medications  Medication Sig Dispense Refill   aspirin EC 81 MG tablet Take 81 mg by mouth once.     fluticasone (FLONASE) 50 MCG/ACT nasal spray INHALE 2 SPRAYS INTO EACH NOSTRIL EVERY DAY. 16 g 3   sacubitril-valsartan (ENTRESTO) 49-51 MG Take 1 tablet by mouth 2 (two) times daily. 180 tablet 3   No current facility-administered medications for this visit.    Allergies as of 03/11/2023 - Review Complete 03/11/2023  Allergen Reaction Noted   Carvedilol Other (See Comments) 12/19/2016   Metoprolol Diarrhea 05/30/2022    ROS:  General: Negative for anorexia, weight loss,  fever, chills, fatigue, weakness. ENT: Negative for hoarseness, difficulty swallowing , nasal congestion. CV: Negative for chest pain, angina, palpitations, dyspnea on exertion, peripheral edema.  Respiratory: Negative for dyspnea at rest, dyspnea on exertion, cough, sputum, wheezing.  GI: See history of present illness. GU:  Negative for dysuria, hematuria, urinary incontinence, urinary frequency, nocturnal urination.  Endo: Negative for unusual weight change.    Physical Examination:   BP (!) 89/61   Pulse 73   Temp 98.7 F (37.1 C) (Oral)   Wt 224 lb 9.6 oz (101.9 kg)   BMI 31.33 kg/m   General: Well-nourished, well-developed in no acute distress.  Eyes: No icterus. Conjunctivae pink. Mouth: Oropharyngeal mucosa moist and pink , no lesions erythema or exudate. Neuro: Alert and oriented x 3.  Grossly intact. Skin: Warm and dry, no jaundice.   Psych: Alert and cooperative, normal mood and affect.   Imaging Studies: LONG TERM MONITOR (3-14 DAYS)  Result Date: 02/17/2023 HR 50 - 102 bpm, average 71 bpm. Rare supraventricular ectopy. Occasional ventricular ectopy, 3.3%. No sustained arrhythmias. No atrial fibrillation. Sheria Lang T. Lalla Brothers, MD, [facility], [facility] Cardiac Electrophysiology    Assessment and Plan:   Aaron Cole is a 72 y.o. y/o male here to follow-up for liver cirrhosis likely secondary to chronic hepatitis C.Status post treatment of hepatitis C .  Immune to hepatitis A.  MELD score of 10.  He iss compensated   completed pneumococcal vaccine in 2020   Plan 1.  Right upper quadrant ultrasound to screen for HCC 2.  EGD  to screen for esophageal varices in 2025 3.  Avoid NSAIDs 4.  Recommend shingles vaccine 5. Labs to calculate MELD score and hep B immune statis  I have discussed alternative options, risks & benefits,  which include, but are not limited to, bleeding, infection, perforation,respiratory complication & drug reaction.  The patient agrees with this plan &  written consent will be obtained.     Dr Wyline Mood  MD,MRCP [facility]) Follow up in 6 months

## 2023-03-11 NOTE — Patient Instructions (Addendum)
For your ultrasound, please do not eat or drink after midnight the night before. Please arrive at the [facility] outpatient imaging (Address: [address], [place], [place] [address]) by 9:15 AM on 03/14/2023. If you need to reschedule, please call [phone].  Please let Dr. Eustaquio Boyden know that Dr. Tobi Bastos recommends for you to get shingles vaccine.

## 2023-03-12 LAB — COMPREHENSIVE METABOLIC PANEL
ALT: 10 IU/L (ref 0–44)
AST: 18 IU/L (ref 0–40)
Albumin: 4.3 g/dL (ref 3.9–4.9)
Alkaline Phosphatase: 72 IU/L (ref 44–121)
BUN/Creatinine Ratio: 16 (ref 10–24)
BUN: 27 mg/dL (ref 8–27)
Bilirubin Total: 0.5 mg/dL (ref 0.0–1.2)
CO2: 20 mmol/L (ref 20–29)
Calcium: 9.4 mg/dL (ref 8.6–10.2)
Chloride: 105 mmol/L (ref 96–106)
Creatinine, Ser: 1.71 mg/dL — ABNORMAL HIGH (ref 0.76–1.27)
Globulin, Total: 2.7 g/dL (ref 1.5–4.5)
Glucose: 102 mg/dL — ABNORMAL HIGH (ref 70–99)
Potassium: 4.7 mmol/L (ref 3.5–5.2)
Sodium: 139 mmol/L (ref 134–144)
Total Protein: 7 g/dL (ref 6.0–8.5)
eGFR: 43 mL/min/{1.73_m2} — ABNORMAL LOW (ref >59)

## 2023-03-12 LAB — CBC WITH DIFFERENTIAL/PLATELET
Basophils Absolute: 0 10*3/uL (ref 0.0–0.2)
Basos: 0 %
EOS (ABSOLUTE): 0.2 10*3/uL (ref 0.0–0.4)
Eos: 2 %
Hematocrit: 43.3 % (ref 37.5–51.0)
Hemoglobin: 14.1 g/dL (ref 13.0–17.7)
Immature Grans (Abs): 0 10*3/uL (ref 0.0–0.1)
Immature Granulocytes: 0 %
Lymphocytes Absolute: 2.3 10*3/uL (ref 0.7–3.1)
Lymphs: 29 %
MCH: 29.4 pg (ref 26.6–33.0)
MCHC: 32.6 g/dL (ref 31.5–35.7)
MCV: 90 fL (ref 79–97)
Monocytes Absolute: 0.8 10*3/uL (ref 0.1–0.9)
Monocytes: 10 %
Neutrophils Absolute: 4.8 10*3/uL (ref 1.4–7.0)
Neutrophils: 59 %
Platelets: 181 10*3/uL (ref 150–450)
RBC: 4.79 x10E6/uL (ref 4.14–5.80)
RDW: 13 % (ref 11.6–15.4)
WBC: 8.1 10*3/uL (ref 3.4–10.8)

## 2023-03-12 LAB — HEPATITIS B SURFACE ANTIBODY,QUALITATIVE: Hep B Surface Ab, Qual: NONREACTIVE

## 2023-03-12 LAB — PROTIME-INR
INR: 1 (ref 0.9–1.2)
Prothrombin Time: 11.2 s (ref 9.1–12.0)

## 2023-03-12 LAB — AFP TUMOR MARKER: AFP, Serum, Tumor Marker: <1.8 ng/mL (ref 0.0–8.4)

## 2023-03-12 NOTE — Progress Notes (Signed)
Aaron Cole - the patients creatinine has gone up . Last CMP was in march 2024   1. Check if on nsaids and if yes advise to stop   2. Hold is valsartan blood pressure pill , check on any water tablets and if yes stop   3. Drink lot of fluids  4. He would have to contact Eustaquio Boyden, MD asap to get evaluated likely will need [facility] kidneys and further work up for elevated creatinine   5. I have cc note to Eustaquio Boyden, MD for follow up   Regards  Irving Bloor

## 2023-03-13 ENCOUNTER — Ambulatory Visit: Payer: Medicare Other | Admitting: Cardiology

## 2023-03-13 NOTE — Progress Notes (Unsigned)
Cardiology Office Note Date:  03/14/2023  Patient ID:  Aaron Cole, Bogdanski [birth year], MRN [id number] PCP:  Eustaquio Boyden, MD  Cardiologist:  Lesleigh Noe, MD (Inactive) Electrophysiologist: Lanier Prude, MD    Chief Complaint: PVC follow-up  History of Present Illness: Aaron Cole is a 72 y.o. male with PMH notable for PVC, HFrEF, s/p AVR; seen today for Lanier Prude, MD for routine electrophysiology followup of PVCs.  He last saw Dr. Lalla Brothers 10/2022, had significant improvement in PVC burden with entresto. Had tried metop, and did not tolerate it in any dose variation. Plan was to obtain ambulatory monitor in about 3 months to eval PVC burden. On previous visit with Dr. Lalla Brothers, did not favor class Ic use d/t structural heart disease, did not favor amiodarone use d/t cirrhosis.   On follow-up today, patient feels very well, better than he has in a few years. 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 Has noticed that he is fatigued by Thursday afternoon whereas he used to be able to work all week.  He denies palpitations, chest pain, chest pressure. No SOB, syncope or presyncope. No edema or unexplained weight gain. Has good appetite.   AAD History: none  Past Medical History:  Diagnosis Date   CAD (coronary artery disease) 2013   per CT scan  - brings CD of CT, no report - will need [facility] records of CT scan report   Cirrhosis of liver (HCC) 01/10/2019   By [place] 03/2020   CKD (chronic kidney disease) stage 3, GFR 30-59 ml/min (HCC) 2015   iatrogenic (decreased nephron mass) established with Dr. Arvin Collard [facility])   Diverticulosis    by CT scan   Hepatitis C virus infection without hepatic coma 03/02/2019   History of hepatitis B virus infection    spontaneously cleared   HTN (hypertension)    Renal cell carcinoma (HCC) 2013   clear cell s/p R nephrectomy   S/p nephrectomy 2013   [facility]   Sinus headache    recurrent    Past Surgical History:  Procedure  Laterality Date   BENTALL PROCEDURE N/A 07/05/2015   Procedure: BENTALL PROCEDURE UTILIZING A 28 X 10 MM WOVEN DOUBLE VELOUR VASCULAR GRAFT AND A GELWEAVE WOVEN VASCULAR GRAFT. 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.;  Surgeon: Alleen Borne, MD;  Location: [facility] OR;  Service: Open Heart Surgery;  Laterality: N/A;   COLONOSCOPY  11/2008   [facility]NL records in chart [facility])   COLONOSCOPY WITH PROPOFOL N/A 02/24/2019   TA, HP, rpt 5 yrs ([facility])   CORONARY ANGIOPLASTY     ESOPHAGOGASTRODUODENOSCOPY (EGD) WITH PROPOFOL N/A 08/04/2020   Procedure: ESOPHAGOGASTRODUODENOSCOPY (EGD) WITH PROPOFOL;  Surgeon: Wyline Mood, MD;  Location: [facility] ENDOSCOPY;  Service: Gastroenterology;  Laterality: N/A;   HYDROCELE EXCISION Right 02/05/2020   Procedure: HYDROCELECTOMY ADULT;  Surgeon: Sondra Come, MD;  Location: [facility] ORS;  Service: Urology;  Laterality: Right;   NASAL SINUS SURGERY  1978   sinus surgery done at [facility]   NEPHRECTOMY Right 12/2011   [facility], R for RCC   rectal fistula repair  1980s   TEE WITHOUT CARDIOVERSION N/A 07/05/2015   Procedure: TRANSESOPHAGEAL ECHOCARDIOGRAM (TEE);  Surgeon: Alleen Borne, MD;  Location[facility] OR;  Service: Open Heart Surgery;  Laterality: N/A;    Current Outpatient Medications  Medication Instructions   aspirin EC (ASPIRIN EC) 81 mg, Oral,  Once  fluticasone (FLONASE) 50 MCG/ACT nasal spray INHALE 2 SPRAYS INTO EACH NOSTRIL EVERY DAY.   sacubitril-valsartan (ENTRESTO) 49-51 MG 1 tablet, Oral, 2 times daily    Social History:  The patient  reports that he has never smoked. He has never used smokeless tobacco. He reports that he does not currently use alcohol. He reports that he does not use drugs.   Family History:  The patient's family history includes CAD in his maternal grandfather; Cancer in his paternal aunt, paternal aunt, and paternal grandmother; Colon polyps in his brother and father; Coronary artery disease in his maternal  uncle; Crohn's disease in his father; Diabetes in his father; Heart disease in his father and mother; Hypertension in his mother.  ROS:  Please see the history of present illness. All other systems are reviewed and otherwise negative.   PHYSICAL EXAM:  VS:  BP (!) 140/80 (BP Location: Left Arm, Patient Position: Sitting, Cuff Size: Large)   Pulse 70   Ht 5\' 11"  (1.803 m)   Wt 227 lb 6 oz (103.1 kg)   SpO2 98%   BMI 31.71 kg/m  BMI: Body mass index is 31.71 kg/m.  GEN- The patient is well appearing, alert and oriented x 3 today.   Lungs- Clear to ausculation bilaterally, normal work of breathing.  Heart- Regular rate and rhythm, no murmurs, rubs or gallops Extremities- No peripheral edema, warm, dry   EKG is ordered. Personal review of EKG from today shows:    EKG Interpretation Date/Time:  Thursday March 14 2023 13:46:40 EDT Ventricular Rate:  70 PR Interval:  134 QRS Duration:  130 QT Interval:  416 QTC Calculation: 449 R Axis:   -47  Text Interpretation: Sinus rhythm with Premature atrial complexes Left axis deviation Left ventricular hypertrophy with QRS widening ( R in aVL , Cornell product ) Confirmed by Sherie Don [phone]) on 03/14/2023 1:55:59 PM    05/30/2022 EKG: SR with occasional PVC, RBBB, rate 76bpm  Recent Labs: 03/11/2023: ALT 10; BUN 27; Creatinine, Ser 1.71; Hemoglobin 14.1; Platelets 181; Potassium 4.7; Sodium 139  03/28/2022: Cholesterol 175; HDL 30.50; LDL Cholesterol 111; Total CHOL/HDL Ratio 6; Triglycerides 168.0; VLDL 33.6   Estimated Creatinine Clearance: 49.1 mL/min (A) (by C-G formula based on SCr of 1.71 mg/dL (H)).   Wt Readings from Last 3 Encounters:  03/14/23 227 lb 6 oz (103.1 kg)  03/11/23 224 lb 9.6 oz (101.9 kg)  02/05/23 227 lb 12.8 oz (103.3 kg)     Additional studies reviewed include: Previous EP, cardiology notes.   Long term monitor, 02/15/2023 HR 50 - 102 bpm, average 71 bpm. Rare supraventricular ectopy. Occasional  ventricular ectopy, 3.3%. No sustained arrhythmias. No atrial fibrillation.  TTE, 07/20/2022  1. Left ventricular ejection fraction, by estimation, is 40 to 45%. The left ventricle has mildly decreased function. The left ventricle has no regional wall motion abnormalities. Left ventricular diastolic parameters are consistent with Grade II diastolic dysfunction (pseudonormalization).   2. Right ventricular systolic function is normal. The right ventricular size is normal.   3. Left atrial size was moderately dilated.   4. The mitral valve is normal in structure. Mild mitral valve regurgitation. No evidence of mitral stenosis.   5. The aortic valve is normal in structure. Aortic valve regurgitation is not visualized. Aortic valve sclerosis is present, with no evidence of aortic valve stenosis. There is a 25 mm Magna pericardial valve present in the aortic position.   6. There is borderline dilatation of the aortic root,  measuring 38 mm.   7. The inferior vena cava is normal in size with greater than 50% respiratory variability, suggesting right atrial pressure of 3 mmHg.   Long term monitor, 04/23/2022   Basic rhythm is NSR with BBB/IVCD with average HR 75 bpm.   PVC burden 24% with episodes of ventricular bigeminy and trigeminy   One 4 beat nonsustained VT  TTE, 03/02/2021  1. Left ventricular endocardial border not optimally defined to evaluate regional wall motion. Overall LV EF appears to be around 50%. Left ventricular diastolic parameters are consistent with Grade I diastolic dysfunction (impaired relaxation).   2. Right ventricular systolic function is normal. The right ventricular size is moderately enlarged. There is normal pulmonary artery systolic pressure.   3. Left atrial size was mildly dilated.   4. Right atrial size was mildly dilated.   5. The mitral valve is normal in structure. Trivial mitral valve regurgitation. No evidence of mitral stenosis. Moderate mitral annular  calcification.   6. The aortic valve has been repaired/replaced. There is a 25 mm Magna  pericardial valve present in the aortic position.  Aortic valve regurgitation is not visualized.   7. 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 S/P Bentall procedure. There is mild dilatation of the  aortic root, measuring 40 mm.   8. The inferior vena cava is normal in size with greater than 50% respiratory variability, suggesting right atrial pressure of 3 mmHg.   9. Recommend repeat limited study with definity to assess for wall motion abnormalities and get accurate assessment of EF   ASSESSMENT AND PLAN:  #) PVC Significant reduction in PVC burden, from 20% down to 3% by most recent ambulatory monitor No PVCs on today's EKG Intolerant of BB Continue entresto 49-51  #) HFmrEF #) CKD Most recent echo with LVEF slightly reduced to 40-45% NYHA II symptoms Warm and dry on exam GDMT: entresto 49-51 Update echo in ~06/2023 Creat slightly elevated on most recent labwork, encouraged hydration and update BMP in 2-3 weeks Patient will contact his nephrologist for follow-up         Current medicines are reviewed at length with the patient today.   The patient does not have concerns regarding his medicines.  The following changes were made today:  none  Labs/ tests ordered today include:  Orders Placed This Encounter  Procedures   EKG 12-Lead     Disposition: Follow up with Dr. Lalla Brothers or EP APP in 6 months   Signed, Sherie Don, NP  03/14/23  1:56 PM  Electrophysiology [facility]

## 2023-03-14 ENCOUNTER — Encounter: Payer: Self-pay | Admitting: Cardiology

## 2023-03-14 ENCOUNTER — Ambulatory Visit: Admission: RE | Admit: 2023-03-14 | Payer: Medicare Other | Source: Ambulatory Visit

## 2023-03-14 ENCOUNTER — Ambulatory Visit: Payer: Medicare Other | Attending: Cardiology | Admitting: Cardiology

## 2023-03-14 VITALS — BP 140/80 | HR 70 | Ht 71.0 in | Wt 227.4 lb

## 2023-03-14 DIAGNOSIS — I493 Ventricular premature depolarization: Secondary | ICD-10-CM | POA: Diagnosis not present

## 2023-03-14 DIAGNOSIS — I5042 Chronic combined systolic (congestive) and diastolic (congestive) heart failure: Secondary | ICD-10-CM | POA: Insufficient documentation

## 2023-03-14 NOTE — Patient Instructions (Addendum)
Medication Instructions:  Your physician recommends that you continue on your current medications as directed. Please refer to the Current Medication list given to you today.  *If you need a refill on your cardiac medications before your next appointment, please call your pharmacy*  Lab Work: Your physician recommends that you return for lab work in: 2 weeks (BMET) If you have labs (blood work) drawn today and your tests are completely normal, you will receive your results only by: MyChart Message (if you have MyChart) OR A paper copy in the mail If you have any lab test that is abnormal or we need to change your treatment, we will call you to review the results.  Testing/Procedures: Your physician has requested that you have an echocardiogram. Echocardiography is a painless test that uses sound waves to create images of your heart. It provides your doctor with information about the size and shape of your heart and how well your heart's chambers and valves are working. This procedure takes approximately one hour. There are no restrictions for this procedure. Please do NOT wear cologne, perfume, aftershave, or lotions (deodorant is allowed). Please arrive 15 minutes prior to your appointment time.   Follow-Up: At [facility], you and your health needs are our priority.  As part of our continuing mission to provide you with exceptional heart care, we have created designated Provider Care Teams.  These Care Teams include your primary Cardiologist (physician) and Advanced Practice Providers (APPs -  Physician Assistants and Nurse Practitioners) who all work together to provide you with the care you need, when you need it.  Your next appointment:   6 month(s)  Provider:   Steffanie Dunn, MD or Sherie Don, NP    Other Instructions Please schedule echo for December

## 2023-03-20 ENCOUNTER — Ambulatory Visit: Payer: Medicare Other

## 2023-03-21 ENCOUNTER — Ambulatory Visit: Payer: Medicare Other

## 2023-03-23 ENCOUNTER — Other Ambulatory Visit: Payer: Self-pay | Admitting: Family Medicine

## 2023-03-23 DIAGNOSIS — N1831 Chronic kidney disease, stage 3a: Secondary | ICD-10-CM

## 2023-03-23 DIAGNOSIS — K7460 Unspecified cirrhosis of liver: Secondary | ICD-10-CM

## 2023-03-23 DIAGNOSIS — Z125 Encounter for screening for malignant neoplasm of prostate: Secondary | ICD-10-CM

## 2023-03-23 DIAGNOSIS — E782 Mixed hyperlipidemia: Secondary | ICD-10-CM

## 2023-03-25 ENCOUNTER — Ambulatory Visit
Admission: RE | Admit: 2023-03-25 | Discharge: 2023-03-25 | Disposition: A | Discharge status: Home / Self Care | Payer: Medicare Other | Source: Ambulatory Visit | Attending: Gastroenterology | Admitting: Gastroenterology

## 2023-03-25 ENCOUNTER — Telehealth: Payer: Self-pay

## 2023-03-25 DIAGNOSIS — K7689 Other specified diseases of liver: Secondary | ICD-10-CM | POA: Diagnosis not present

## 2023-03-25 DIAGNOSIS — K7460 Unspecified cirrhosis of liver: Secondary | ICD-10-CM | POA: Diagnosis not present

## 2023-03-25 DIAGNOSIS — E279 Disorder of adrenal gland, unspecified: Secondary | ICD-10-CM

## 2023-03-25 DIAGNOSIS — K8020 Calculus of gallbladder without cholecystitis without obstruction: Secondary | ICD-10-CM | POA: Diagnosis not present

## 2023-03-25 NOTE — Telephone Encounter (Signed)
-----   Message from Wyline Mood sent at 03/25/2023  1:11 PM EDT ----- Inform patient that the ultrasound showed cirrhosis of the liver and there is also a 3.7 cm lesion within the right adrenal gland further evaluation with MRI recommended we can order the MRI and I will CC Dr. Sharen Hones on this message to follow-up pl ease  [facility]

## 2023-03-25 NOTE — Telephone Encounter (Signed)
Called and patient verbalized understanding of results. Patient states he would need this done any day after 2:00pm in the afternoon. Called and got patient and got patient schedule for 04/02/2023 arrive at 2:30pm for a 3:[facility]pm [facility]. Nothing to eat or drink 4 hours prior. Called and patient verbalized understanding of instructions

## 2023-04-01 ENCOUNTER — Other Ambulatory Visit (INDEPENDENT_AMBULATORY_CARE_PROVIDER_SITE_OTHER): Payer: Medicare Other

## 2023-04-01 ENCOUNTER — Telehealth: Payer: Self-pay | Admitting: Cardiology

## 2023-04-01 DIAGNOSIS — K7460 Unspecified cirrhosis of liver: Secondary | ICD-10-CM

## 2023-04-01 DIAGNOSIS — N1831 Chronic kidney disease, stage 3a: Secondary | ICD-10-CM

## 2023-04-01 DIAGNOSIS — Z125 Encounter for screening for malignant neoplasm of prostate: Secondary | ICD-10-CM | POA: Diagnosis not present

## 2023-04-01 DIAGNOSIS — E782 Mixed hyperlipidemia: Secondary | ICD-10-CM | POA: Diagnosis not present

## 2023-04-01 LAB — PHOSPHORUS: Phosphorus: 3.3 mg/dL (ref 2.3–4.6)

## 2023-04-01 LAB — COMPREHENSIVE METABOLIC PANEL
ALT: 11 U/L (ref 0–53)
AST: 17 U/L (ref 0–37)
Albumin: 4.2 g/dL (ref 3.5–5.2)
Alkaline Phosphatase: 64 U/L (ref 39–117)
BUN: 16 mg/dL (ref 6–23)
CO2: 25 meq/L (ref 19–32)
Calcium: 9.5 mg/dL (ref 8.4–10.5)
Chloride: 106 meq/L (ref 96–112)
Creatinine, Ser: 1.29 mg/dL (ref 0.40–1.50)
GFR: 56.14 mL/min — ABNORMAL LOW (ref >60.00)
Glucose, Bld: 100 mg/dL — ABNORMAL HIGH (ref 70–99)
Potassium: 4.1 meq/L (ref 3.5–5.1)
Sodium: 140 meq/L (ref 135–145)
Total Bilirubin: 0.7 mg/dL (ref 0.2–1.2)
Total Protein: 7.3 g/dL (ref 6.0–8.3)

## 2023-04-01 LAB — MICROALBUMIN / CREATININE URINE RATIO
Creatinine,U: 58 mg/dL
Microalb Creat Ratio: 3.6 mg/g (ref 0.0–30.0)
Microalb, Ur: 2.1 mg/dL — ABNORMAL HIGH (ref 0.0–1.9)

## 2023-04-01 LAB — PSA, MEDICARE: PSA: 1.83 ng/mL (ref 0.10–4.00)

## 2023-04-01 LAB — LIPID PANEL
Cholesterol: 184 mg/dL (ref 0–200)
HDL: 31.1 mg/dL — ABNORMAL LOW (ref >39.00)
LDL Cholesterol: 118 mg/dL — ABNORMAL HIGH (ref 0–99)
NonHDL: 153.11
Total CHOL/HDL Ratio: 6
Triglycerides: 174 mg/dL — ABNORMAL HIGH (ref 0.0–149.0)
VLDL: 34.8 mg/dL (ref 0.0–40.0)

## 2023-04-01 LAB — VITAMIN D 25 HYDROXY (VIT D DEFICIENCY, FRACTURES): VITD: 28.27 ng/mL — ABNORMAL LOW (ref 30.00–100.00)

## 2023-04-01 NOTE — Progress Notes (Signed)
Noted. Pending labs and MRI today. Will further discuss at office visit 04/08/2023.

## 2023-04-01 NOTE — Telephone Encounter (Signed)
Patient would like for Suzann take a look at some blood work he had done today.  He thinks that his kidney doctor is going to having him stop taking his BP medication she prescribed, sacubitril-valsartan (ENTRESTO) 49-51 MG.  He said he should hear from the kidney doctor tomorrow.  He states he needs a back up medication.

## 2023-04-02 ENCOUNTER — Ambulatory Visit
Admission: RE | Admit: 2023-04-02 | Discharge: 2023-04-02 | Disposition: A | Discharge status: Home / Self Care | Payer: Medicare Other | Source: Ambulatory Visit | Attending: Gastroenterology | Admitting: Gastroenterology

## 2023-04-02 DIAGNOSIS — Z905 Acquired absence of kidney: Secondary | ICD-10-CM | POA: Diagnosis not present

## 2023-04-02 DIAGNOSIS — C641 Malignant neoplasm of right kidney, except renal pelvis: Secondary | ICD-10-CM | POA: Diagnosis not present

## 2023-04-02 DIAGNOSIS — E279 Disorder of adrenal gland, unspecified: Secondary | ICD-10-CM | POA: Insufficient documentation

## 2023-04-02 DIAGNOSIS — K769 Liver disease, unspecified: Secondary | ICD-10-CM | POA: Diagnosis not present

## 2023-04-02 DIAGNOSIS — K8020 Calculus of gallbladder without cholecystitis without obstruction: Secondary | ICD-10-CM | POA: Diagnosis not present

## 2023-04-02 LAB — PARATHYROID HORMONE, INTACT (NO CA): PTH: 58 pg/mL (ref 16–77)

## 2023-04-02 MED ORDER — GADOBUTROL 1 MMOL/ML IV SOLN
10.0000 mL | Freq: Once | INTRAVENOUS | Status: AC | PRN
  Administered 2023-04-02: 10 mL via INTRAVENOUS

## 2023-04-02 NOTE — Telephone Encounter (Signed)
Pt called reporting he recently had blood work yesterday, and stated he was informed by his nephrologist that entresto may need to be discontinued if kidney function does not improve. He stated he has not heard from his nephrologist as of yet regarding most recent lab work, but would like Suzann to review results and to think of an alternative in case his nephrologist recommends discontinuing.     Will forward to NP

## 2023-04-02 NOTE — Telephone Encounter (Signed)
Left a message for the patient to call back.    Sherie Don, NP  You; Darene Lamer, LPN2 hours ago (11:05 AM)    Creatinine is significantly improved, down from 1.7 to 1.29, so I doubt that entresto will need to be stopped.

## 2023-04-04 NOTE — Telephone Encounter (Signed)
Called patient, advised of message below. Patient verbalized understanding.  

## 2023-04-08 ENCOUNTER — Encounter: Payer: Self-pay | Admitting: Family Medicine

## 2023-04-08 ENCOUNTER — Ambulatory Visit: Payer: Medicare Other | Admitting: Family Medicine

## 2023-04-08 VITALS — BP 122/80 | HR 65 | Temp 98.0°F | Ht 69.5 in | Wt 221.5 lb

## 2023-04-08 DIAGNOSIS — C7970 Secondary malignant neoplasm of unspecified adrenal gland: Secondary | ICD-10-CM | POA: Insufficient documentation

## 2023-04-08 DIAGNOSIS — Z85528 Personal history of other malignant neoplasm of kidney: Secondary | ICD-10-CM

## 2023-04-08 DIAGNOSIS — Z23 Encounter for immunization: Secondary | ICD-10-CM | POA: Diagnosis not present

## 2023-04-08 DIAGNOSIS — E782 Mixed hyperlipidemia: Secondary | ICD-10-CM

## 2023-04-08 DIAGNOSIS — K7460 Unspecified cirrhosis of liver: Secondary | ICD-10-CM | POA: Diagnosis not present

## 2023-04-08 DIAGNOSIS — N1831 Chronic kidney disease, stage 3a: Secondary | ICD-10-CM | POA: Diagnosis not present

## 2023-04-08 DIAGNOSIS — I10 Essential (primary) hypertension: Secondary | ICD-10-CM | POA: Diagnosis not present

## 2023-04-08 DIAGNOSIS — Z952 Presence of prosthetic heart valve: Secondary | ICD-10-CM

## 2023-04-08 DIAGNOSIS — E559 Vitamin D deficiency, unspecified: Secondary | ICD-10-CM

## 2023-04-08 DIAGNOSIS — Z905 Acquired absence of kidney: Secondary | ICD-10-CM | POA: Diagnosis not present

## 2023-04-08 DIAGNOSIS — Z8679 Personal history of other diseases of the circulatory system: Secondary | ICD-10-CM

## 2023-04-08 DIAGNOSIS — Z7189 Other specified counseling: Secondary | ICD-10-CM

## 2023-04-08 DIAGNOSIS — E278 Other specified disorders of adrenal gland: Secondary | ICD-10-CM | POA: Diagnosis not present

## 2023-04-08 DIAGNOSIS — Z0000 Encounter for general adult medical examination without abnormal findings: Secondary | ICD-10-CM

## 2023-04-08 MED ORDER — VITAMIN D3 25 MCG (1000 UT) PO CAPS: 1.0000 | ORAL_CAPSULE | Freq: Every day | ORAL | Status: DC

## 2023-04-08 MED ORDER — FISH OIL 500 MG PO CAPS: 1.0000 | ORAL_CAPSULE | Freq: Every day | ORAL | Status: DC

## 2023-04-08 NOTE — Assessment & Plan Note (Signed)
Appreciate GI care. UTD EGD 2022. Recent abd [place] reassuring.

## 2023-04-08 NOTE — Assessment & Plan Note (Signed)
Previously reviewed.

## 2023-04-08 NOTE — Assessment & Plan Note (Signed)
Chronic, stable to improved - closely followed by [facility] nephrology

## 2023-04-08 NOTE — Assessment & Plan Note (Signed)
Chronic off statin. Reviewed diet choices to control triglycerides and LDL levels. The 10-year ASCVD risk score (Arnett DK, et al., 2019) is: 19.7%   Values used to calculate the score:     Age: 72 years     Sex: Male     Is Non-Hispanic African American: No     Diabetic: No     Tobacco smoker: No     Systolic Blood Pressure: 122 mmHg     Is BP treated: No     HDL Cholesterol: 31.1 mg/dL     Total Cholesterol: 184 mg/dL

## 2023-04-08 NOTE — Patient Instructions (Addendum)
Flu shot today If interested, check with pharmacy about new 2 shot shingles series (shingrix).  Start vitamin D 1000 units daily.  Good to see you today Return as needed or in 1 year for next wellness visit

## 2023-04-08 NOTE — Assessment & Plan Note (Signed)
Regularly followed by [facility] nephrology

## 2023-04-08 NOTE — Assessment & Plan Note (Signed)
Pending abd MRI with and w/o contrast for further evaluation

## 2023-04-08 NOTE — Assessment & Plan Note (Signed)
Chronic, stable on current regimen of Entresto 49/51mg  bid

## 2023-04-08 NOTE — Assessment & Plan Note (Signed)
Start vit D 1000 international units daily.

## 2023-04-08 NOTE — Progress Notes (Signed)
Ph: [phone] Fax: [phone]   Patient ID: Aaron Cole, male    DOB: 1951-02-19, 72 y.o.   MRN: [id number]  This visit was conducted in person.  BP 122/80   Pulse 65   Temp 98 F (36.7 C) (Oral)   Ht 5' 9.5" (1.765 m)   Wt 221 lb 8 oz (100.5 kg)   SpO2 97%   BMI 32.24 kg/m    CC: AMW Subjective:   HPI: Aaron Cole is a 72 y.o. male presenting on 04/08/2023 for Medicare Wellness   Did not see health advisor this year.   Hearing Screening   500Hz  1000Hz  2000Hz  4000Hz   Right ear 20 40 25 0  Left ear 20 20 20 20   Vision Screening - Comments:: Last eye exam, 10/22023.  Planning to see ENT for ear cleaning and repeat hearing exam Flowsheet Row Office Visit from 04/08/2023 in [facility] [place]  PHQ-2 Total Score 1          04/08/2023    2:43 PM 02/05/2023    9:09 AM 04/04/2022    3:50 PM 01/22/2020    3:37 PM 01/08/2019   11:41 AM  Fall Risk   Falls in the past year? 1 0 0 0 0  Number falls in past yr: 1 0  0   Injury with Fall? 1 0  0   Risk for fall due to :  No Fall Risks  Medication side effect   Follow up  Falls evaluation completed  Falls evaluation completed;Falls prevention discussed   Fall summer 2024 - slipped on dirt, gravel.   Had Bentall procedure 07/2015 - ascending aortic aneurysm repair with AVR ([facility]). 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   S/p R nephrectomy for renal cell cancer (2013). Sees Kennith Gain NP at [facility] nephrology - sees yearly.   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 H/o spontaneously cleared prior Hep B infection.   Sees GI Dr Tobi Bastos for chronic liver cirrhosis - more recently had increase in creatinine to 1.7. Recent abd US showing 3.7cm hypoechomic mass within R adrenal gland - abd MR w/w/o results pending  Renal and cardiology are following kidney function ?Entresto related.   Preventative: COLONOSCOPY WITH PROPOFOL  02/24/2019 - TA, HP, rpt 5 yrs ([facility]) EGD 08/2020 - WNL, HH, rpt 3 yrs Tobi Bastos)  Prostate cancer screening - yearly PSA  Lung cancer screening - not eligible AAA screen - not eligible. Flu yearly Tetanus - 08/2012 prevnar - 12/2017, pneumovax 12/2018 COVID vaccine - completed Pfizer series 08/2019 Shingrix - discussed.  Advanced directive: reviewed through [employer]. HCPOA is aunt June Reece then Victorio Palm. Grants discretion to [employer] for life support decision if terminal or incurable condition.  Seat belt use discussed.  Sunscreen use discussed. No changing moles on skin.  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 Interim medical history since our last visit reviewed. Allergies and medications reviewed and updated. Outpatient Medications Prior to Visit  Medication Sig Dispense Refill  aspirin EC 81 MG tablet Take 81 mg by mouth once.     fluticasone (FLONASE) 50 MCG/ACT nasal spray INHALE 2 SPRAYS INTO EACH NOSTRIL EVERY DAY. 16 g 3   sacubitril-valsartan (ENTRESTO) 49-51 MG Take 1 tablet by mouth 2 (two) times daily. 180 tablet 3   No facility-administered medications prior to visit.     Per HPI unless specifically indicated in ROS section below Review of Systems  Objective:  BP 122/80   Pulse 65   Temp 98 F (36.7 C) (Oral)   Ht 5' 9.5" (1.765 m)   Wt 221 lb 8 oz (100.5 kg)   SpO2 97%   BMI 32.24 kg/m   Wt Readings from Last 3 Encounters:  04/08/23 221 lb 8 oz (100.5 kg)  03/14/23 227 lb 6 oz (103.1 kg)  03/11/23 224 lb 9.6 oz (101.9 kg)      Physical Exam Vitals and nursing  note reviewed.  Constitutional:      General: He is not in acute distress.    Appearance: Normal appearance. He is well-developed. He is not ill-appearing.  HENT:     Head: Normocephalic and atraumatic.     Right Ear: Hearing, tympanic membrane, ear canal and external ear normal.     Left Ear: Hearing, tympanic membrane, ear canal and external ear normal.     Mouth/Throat:     Mouth: Mucous membranes are moist.     Pharynx: Oropharynx is clear. No oropharyngeal exudate.  Eyes:     General: No scleral icterus.    Extraocular Movements: Extraocular movements intact.     Conjunctiva/sclera: Conjunctivae normal.     Pupils: Pupils are equal, round, and reactive to light.  Neck:     Thyroid: No thyroid mass or thyromegaly.     Vascular: No carotid bruit.  Cardiovascular:     Rate and Rhythm: Normal rate and regular rhythm.     Pulses: Normal pulses.          Radial pulses are 2+ on the right side and 2+ on the left side.     Heart sounds: Normal heart sounds. No murmur heard. Pulmonary:     Effort: Pulmonary effort is normal. No respiratory distress.     Breath sounds: Normal breath sounds. No wheezing, rhonchi or rales.  Abdominal:     General: Bowel sounds are normal. There is no distension.     Palpations: Abdomen is soft. There is no mass.     Tenderness: There is no abdominal tenderness. There is no guarding or rebound.     Hernia: No hernia is present.  Musculoskeletal:        General: Normal range of motion.     Cervical back: Normal range of motion and neck supple.     Right lower leg: No edema.     Left lower leg: No edema.  Lymphadenopathy:     Cervical: No cervical adenopathy.  Skin:    General: Skin is warm and dry.     Findings: No rash.  Neurological:     General: No focal deficit present.     Mental Status: He is alert and oriented to person, place, and time.     Comments:  Recall 3/3 Calculation 5/5 DLROW  Psychiatric:        Mood and Affect: Mood normal.         Behavior: Behavior normal.        Thought Content: Thought content normal.  Judgment: Judgment normal.       Results for orders placed or performed in visit on 04/01/23  PSA, Medicare  Result Value Ref Range   PSA 1.83 0.10 - 4.00 ng/ml  Parathyroid hormone, intact (no Ca)  Result Value Ref Range   PTH 58 16 - 77 pg/mL  Microalbumin / creatinine urine ratio  Result Value Ref Range   Microalb, Ur 2.1 (H) 0.0 - 1.9 mg/dL   Creatinine,U 11.9 mg/dL   Microalb Creat Ratio 3.6 0.0 - 30.0 mg/g  VITAMIN D 25 Hydroxy (Vit-D Deficiency, Fractures)  Result Value Ref Range   VITD 28.27 (L) 30.00 - 100.00 ng/mL  Phosphorus  Result Value Ref Range   Phosphorus 3.3 2.3 - 4.6 mg/dL  Comprehensive metabolic panel  Result Value Ref Range   Sodium 140 135 - 145 mEq/L   Potassium 4.1 3.5 - 5.1 mEq/L   Chloride 106 96 - 112 mEq/L   CO2 25 19 - 32 mEq/L   Glucose, Bld 100 (H) 70 - 99 mg/dL   BUN 16 6 - 23 mg/dL   Creatinine, Ser 1.47 0.40 - 1.50 mg/dL   Total Bilirubin 0.7 0.2 - 1.2 mg/dL   Alkaline Phosphatase 64 39 - 117 U/L   AST 17 0 - 37 U/L   ALT 11 0 - 53 U/L   Total Protein 7.3 6.0 - 8.3 g/dL   Albumin 4.2 3.5 - 5.2 g/dL   GFR 82.95 (L) >62.13 mL/min   Calcium 9.5 8.4 - 10.5 mg/dL  Lipid panel  Result Value Ref Range   Cholesterol 184 0 - 200 mg/dL   Triglycerides 086.5 (H) 0.0 - 149.0 mg/dL   HDL 78.46 (L) >96.29 mg/dL   VLDL 52.8 0.0 - 41.3 mg/dL   LDL Cholesterol 244 (H) 0 - 99 mg/dL   Total CHOL/HDL Ratio 6    NonHDL 153.11     Assessment & Plan:   Problem List Items Addressed This Visit     Medicare annual wellness visit, subsequent - Primary (Chronic)    I have personally reviewed the Medicare Annual Wellness questionnaire and have noted 1. The patient's medical and social history 2. Their use of alcohol, tobacco or illicit drugs 3. Their current medications and supplements 4. The patient's functional ability including ADL's, fall risks, home safety  risks and hearing or visual impairment. Cognitive function has been assessed and addressed as indicated.  5. Diet and physical activity 6. Evidence for depression or mood disorders The patients weight, height, BMI have been recorded in the chart. I have made referrals, counseling and provided education to the patient based on review of the above and I have provided the pt with a written personalized care plan for preventive services. Provider list updated.. See scanned questionairre as needed for further documentation. Reviewed preventative protocols and updated unless pt declined.       Advanced care planning/counseling discussion (Chronic)    Previously reviewed.       HTN (hypertension)    Chronic, stable on current regimen of Entresto 49/51mg  bid      History of renal cell carcinoma    Regularly followed by [facility] nephrology      S/p nephrectomy   CKD (chronic kidney disease) stage 3, GFR 30-59 ml/min (HCC)    Chronic, stable to improved - closely followed by [facility] nephrology      HLD (hyperlipidemia)    Chronic off statin. Reviewed diet choices to control triglycerides and LDL levels. The 10-year ASCVD risk  score (Arnett DK, et al., 2019) is: 19.7%   Values used to calculate the score:     Age: 51 years     Sex: Male     Is Non-Hispanic African American: No     Diabetic: No     Tobacco smoker: No     Systolic Blood Pressure: 122 mmHg     Is BP treated: No     HDL Cholesterol: 31.1 mg/dL     Total Cholesterol: 184 mg/dL       S/P ascending aortic aneurysm repair   S/P aortic valve replacement   Cirrhosis of liver without ascites (HCC)    Appreciate GI care. UTD EGD 2022. Recent abd [place] reassuring.       Adrenal mass (HCC)    Pending abd MRI with and w/o contrast for further evaluation      Vitamin D deficiency    Start vit D 1000 international units daily.       Other Visit Diagnoses     Encounter for immunization       Relevant Orders   Flu Vaccine  Trivalent High Dose (Fluad) (Completed)        Meds ordered this encounter  Medications   Omega-3 Fatty Acids (FISH OIL) 500 MG CAPS    Sig: Take 1 capsule (500 mg total) by mouth daily.   Cholecalciferol (VITAMIN D3) 25 MCG (1000 UT) CAPS    Sig: Take 1 capsule (1,000 Units total) by mouth daily.    Orders Placed This Encounter  Procedures   Flu Vaccine Trivalent High Dose (Fluad)    Patient Instructions  Flu shot today If interested, check with pharmacy about new 2 shot shingles series (shingrix).  Start vitamin D 1000 units daily.  Good to see you today Return as needed or in 1 year for next wellness visit   Follow up plan: Return in about 1 year (around 04/07/2024) for medicare wellness visit.  Eustaquio Boyden, MD

## 2023-04-08 NOTE — Assessment & Plan Note (Signed)

## 2023-04-11 ENCOUNTER — Telehealth: Payer: Self-pay | Admitting: Gastroenterology

## 2023-04-11 NOTE — Telephone Encounter (Signed)
The radiology rep Deforest Hoyles) called in to give MRI result.

## 2023-04-11 NOTE — Telephone Encounter (Signed)
Dr. Tobi Bastos, had reviewed results before this call and he also notified patient's PCP Dr. Sharen Hones to make the appropriate referrals.

## 2023-04-11 NOTE — Telephone Encounter (Signed)
Rubi from [facility] radiology called stating that they wanted you to red over patient's MRI results.

## 2023-04-17 ENCOUNTER — Telehealth: Payer: Self-pay

## 2023-04-17 NOTE — Telephone Encounter (Signed)
Patient called stating that he does not have anyone to come with him to have his procedure. Therefore, he stated that he was calling to cancel and that he would give [place] a call once he gets somebody to come with him. I then called the endoscopy unit and spoke to Trish letting her know that the patient was cancelling. Patient stated that he would be contacting us back whenever he had somebody. I told him that it was ok.

## 2023-04-18 ENCOUNTER — Ambulatory Visit: Admission: RE | Admit: 2023-04-18 | Payer: Medicare Other | Source: Home / Self Care | Admitting: Gastroenterology

## 2023-04-18 ENCOUNTER — Encounter: Admission: RE | Payer: Self-pay | Source: Home / Self Care

## 2023-04-18 SURGERY — ESOPHAGOGASTRODUODENOSCOPY (EGD) WITH PROPOFOL
Anesthesia: General

## 2023-04-25 ENCOUNTER — Other Ambulatory Visit: Payer: Self-pay | Admitting: Family Medicine

## 2023-04-25 DIAGNOSIS — E278 Other specified disorders of adrenal gland: Secondary | ICD-10-CM

## 2023-04-25 DIAGNOSIS — Z85528 Personal history of other malignant neoplasm of kidney: Secondary | ICD-10-CM

## 2023-04-26 ENCOUNTER — Other Ambulatory Visit (INDEPENDENT_AMBULATORY_CARE_PROVIDER_SITE_OTHER): Payer: Medicare Other

## 2023-04-26 ENCOUNTER — Encounter: Payer: Self-pay | Admitting: *Deleted

## 2023-04-26 DIAGNOSIS — E278 Other specified disorders of adrenal gland: Secondary | ICD-10-CM

## 2023-04-26 NOTE — Addendum Note (Signed)
Addended by: Alvina Chou on: 04/26/2023 07:45 AM   Modules accepted: Orders

## 2023-04-29 ENCOUNTER — Encounter: Payer: Self-pay | Admitting: Surgery

## 2023-04-29 ENCOUNTER — Ambulatory Visit (INDEPENDENT_AMBULATORY_CARE_PROVIDER_SITE_OTHER): Payer: Medicare Other | Admitting: Surgery

## 2023-04-29 ENCOUNTER — Telehealth: Payer: Self-pay | Admitting: Family Medicine

## 2023-04-29 VITALS — BP 129/80 | HR 73 | Temp 98.0°F | Ht 71.0 in | Wt 225.0 lb

## 2023-04-29 DIAGNOSIS — D500 Iron deficiency anemia secondary to blood loss (chronic): Secondary | ICD-10-CM

## 2023-04-29 DIAGNOSIS — Z85528 Personal history of other malignant neoplasm of kidney: Secondary | ICD-10-CM

## 2023-04-29 DIAGNOSIS — E278 Other specified disorders of adrenal gland: Secondary | ICD-10-CM | POA: Diagnosis not present

## 2023-04-29 NOTE — Progress Notes (Signed)
Patient ID: Aaron Cole, male   DOB: 1951/03/03, 72 y.o.   MRN: [id number]  HPI Aaron Cole is a 72 y.o. male seen in consultation for possible Right adrenal mass vs recurrence. 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  2017 had Bentall procedure ( aortic root and valve replacement) by Dr. Jules Schick [facility]. He was following with Dr. Tobi Bastos for his liver cirrhosis and part of the workup was a right upper quadrant ultrasound to screen for hepatocellular carcinoma given that he had hepatitis C.  Latest ultrasound showed evidence of a potential adrenal mass.  This was followed by an MRI.  Please note that I have personally reviewed the images and actually show when they will the images.  There is evidence of 3.5 cm mass and enhances. INR and LFT 1 months ago was nml   HPI  Past Medical History:  Diagnosis Date   CAD (coronary artery disease) 2013   per CT scan  - brings CD of CT, no report - will need [facility] records of CT scan report   Cirrhosis of liver (HCC) 01/10/2019   By [place] 03/2020   CKD (chronic kidney disease) stage 3, GFR 30-59 ml/min (HCC) 2015   iatrogenic (decreased nephron mass) established with Dr. Arvin Collard [facility])   Diverticulosis    by CT scan   Hepatitis C virus infection without hepatic coma 03/02/2019   History of hepatitis B virus infection    spontaneously cleared   HTN (hypertension)    Renal cell carcinoma (HCC) 2013   clear cell s/p R nephrectomy   S/p nephrectomy 2013   [facility]   Sinus headache    recurrent    Past Surgical History:  Procedure Laterality Date   BENTALL PROCEDURE N/A 07/05/2015   Procedure: BENTALL PROCEDURE UTILIZING A 28 X 10 MM WOVEN DOUBLE VELOUR VASCULAR GRAFT AND A GELWEAVE WOVEN VASCULAR GRAFT. 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.;  Surgeon: Alleen Borne, MD;  Location: [facility] OR;  Service: Open Heart  Surgery;  Laterality: N/A;   COLONOSCOPY  11/2008   WNL records in chart [facility])   COLONOSCOPY WITH PROPOFOL N/A 02/24/2019   TA, HP, rpt 5 yrs ([facility])   CORONARY ANGIOPLASTY     ESOPHAGOGASTRODUODENOSCOPY (EGD) WITH PROPOFOL N/A 08/04/2020   Procedure: ESOPHAGOGASTRODUODENOSCOPY (EGD) WITH PROPOFOL;  Surgeon: Wyline Mood, MD;  [facility] ENDOSCOPY;  Service: Gastroenterology;  Laterality: N/A;   HYDROCELE EXCISION Right 02/05/2020   Procedure: HYDROCELECTOMY ADULT;  Surgeon: Sondra Come, MD;  Location: [facility] ORS;  Service: Urology;  Laterality: Right;   NASAL SINUS SURGERY  1978   sinus surgery done at [facility]   NEPHRECTOMY Right 12/2011   [facility], R for RCC   rectal fistula repair  1980s   TEE WITHOUT CARDIOVERSION N/A 07/05/2015   Procedure: TRANSESOPHAGEAL ECHOCARDIOGRAM (TEE);  Surgeon: Alleen Borne, MD;  Location: [facility] OR;  Service: Open Heart Surgery;  Laterality: N/A;    Family History  Problem Relation Age of Onset   Cancer Paternal Grandmother        throat   Heart disease Mother    Hypertension Mother    Heart disease Father    Diabetes Father    Colon polyps Father    Crohn's disease Father    Coronary artery disease Maternal Uncle  MI   Cancer Paternal Aunt        breast    Cancer Paternal Aunt        brain   Colon polyps Brother    CAD Maternal Grandfather    Stroke Neg Hx     Social History Social History   Tobacco Use   Smoking status: Never   Smokeless tobacco: Never  Vaping Use   Vaping status: Never Used  Substance Use Topics   Alcohol use: Not Currently   Drug use: Never    Allergies  Allergen Reactions   Carvedilol Other (See Comments)    Blurred vision, drowsiness   Metoprolol Diarrhea    Current Outpatient Medications  Medication Sig Dispense Refill   aspirin EC 81 MG tablet Take 81 mg by mouth once.     Cholecalciferol (VITAMIN D3) 25 MCG (1000 UT) CAPS Take 1 capsule (1,000 Units total) by mouth daily.     fluticasone (FLONASE)  50 MCG/ACT nasal spray INHALE 2 SPRAYS INTO EACH NOSTRIL EVERY DAY. 16 g 3   Omega-3 Fatty Acids (FISH OIL) 500 MG CAPS Take 1 capsule (500 mg total) by mouth daily.     sacubitril-valsartan (ENTRESTO) 49-51 MG Take 1 tablet by mouth 2 (two) times daily. 180 tablet 3   No current facility-administered medications for this visit.     Review of Systems Full ROS  was asked and was negative except for the information on the HPI  Physical Exam  CONSTITUTIONAL: NAD. EYES: Pupils are equal, round, , Sclera are non-icteric. EARS, NOSE, MOUTH AND THROAT: The oropharynx is clear. The oral mucosa is pink and moist. Hearing is intact to voice. LYMPH NODES:  Lymph nodes in the neck are normal. RESPIRATORY:  Lungs are clear. There is normal respiratory effort, with equal breath sounds bilaterally, and without pathologic use of accessory muscles. CARDIOVASCULAR: Heart is regular without murmurs, gallops, or rubs. GI: The abdomen is  soft, nontender, and nondistended. There are no palpable masses. There is no hepatosplenomegaly. There are normal bowel sounds I. Prior laparoscopic incisions GU: Rectal deferred.   MUSCULOSKELETAL: Normal muscle strength and tone. No cyanosis or edema.   SKIN: Turgor is good and there are no pathologic skin lesions or ulcers. NEUROLOGIC: Motor and sensation is grossly normal. Cranial nerves are grossly intact. PSYCH:  Oriented to person, place and time. Affect is normal.  Data Reviewed  I have personally reviewed the patient's imaging, laboratory findings and medical records.    Assessment/Plan 72 year old male with history hepatitis C ( treated)  and Child A cirrhosis ( compensated)  with prior history of renal cell carcinoma on the right side now the mass on the right kidney bed.  I do think that this is probably consistent with recurrent renal cell carcinoma or metastatic disease.  I cannot necessarily exclude primary adrenal carcinoma. We Will need to rule out  pheochromocytoma before attempting biopsy.  I do think that he will likely benefit from percutaneous biopsy of this mass as well as a CT PET to further stage his disease. Will make sure that we also have both urology and oncology consultations I had an extensive discussion with the patient regarding his disease process.  I was able to show him the MRI and prior images. We will recheck LFT and INR to assess liver fx He is appreciative and shows understanding I spent greater than  60 minutes in this encounter including extensive review of medical records, personally reviewing imaging studies, coordinating her care,  placing orders, counseling the patient and performing appropriate documentation   Sterling Big, MD FACS General Surgeon 04/29/2023, 9:15 AM

## 2023-04-29 NOTE — Telephone Encounter (Signed)
Noted. Fyi to Dr Reece Agar (click 'View Conversation' blue link to see all responses).

## 2023-04-29 NOTE — Telephone Encounter (Signed)
Pt called stating he was doing a 24 hr urine & he missed 1 sample during the period. Pt asked does he need to start over? Call back # [phone]

## 2023-04-29 NOTE — Telephone Encounter (Signed)
Noted. Thanks.

## 2023-04-29 NOTE — Patient Instructions (Addendum)
We have placed referrals to Urology and Oncology. They will call you to schedule these appointments.  We have also requested more lab work. You may have this done anytime at [facility], [address] entrance.    Follow up here in 2-3 weeks.

## 2023-04-30 LAB — ALDOSTERONE + RENIN ACTIVITY W/ RATIO
ALDO / PRA Ratio: 7.4 ratio (ref 0.9–28.9)
Aldosterone: 14 ng/dL
Renin Activity: 1.89 ng/mL/h (ref 0.25–5.82)

## 2023-04-30 LAB — DHEA-SULFATE: DHEA-SO4: 73 ug/dL (ref 20–217)

## 2023-05-02 ENCOUNTER — Other Ambulatory Visit: Payer: Self-pay | Admitting: Radiology

## 2023-05-02 ENCOUNTER — Inpatient Hospital Stay: Payer: Medicare Other | Attending: Oncology | Admitting: Oncology

## 2023-05-02 ENCOUNTER — Other Ambulatory Visit: Payer: Self-pay

## 2023-05-02 ENCOUNTER — Encounter: Payer: Self-pay | Admitting: Oncology

## 2023-05-02 ENCOUNTER — Inpatient Hospital Stay: Payer: Medicare Other

## 2023-05-02 VITALS — BP 132/72 | HR 64 | Temp 97.4°F | Resp 16 | Ht 71.0 in | Wt 229.0 lb

## 2023-05-02 DIAGNOSIS — D487 Neoplasm of uncertain behavior of other specified sites: Secondary | ICD-10-CM | POA: Insufficient documentation

## 2023-05-02 DIAGNOSIS — E278 Other specified disorders of adrenal gland: Secondary | ICD-10-CM

## 2023-05-02 DIAGNOSIS — R978 Other abnormal tumor markers: Secondary | ICD-10-CM

## 2023-05-02 DIAGNOSIS — R9389 Abnormal findings on diagnostic imaging of other specified body structures: Secondary | ICD-10-CM

## 2023-05-02 DIAGNOSIS — R1900 Intra-abdominal and pelvic swelling, mass and lump, unspecified site: Secondary | ICD-10-CM | POA: Diagnosis not present

## 2023-05-02 DIAGNOSIS — Z85528 Personal history of other malignant neoplasm of kidney: Secondary | ICD-10-CM | POA: Insufficient documentation

## 2023-05-02 DIAGNOSIS — Z905 Acquired absence of kidney: Secondary | ICD-10-CM | POA: Diagnosis not present

## 2023-05-02 DIAGNOSIS — R935 Abnormal findings on diagnostic imaging of other abdominal regions, including retroperitoneum: Secondary | ICD-10-CM | POA: Diagnosis not present

## 2023-05-02 DIAGNOSIS — N2889 Other specified disorders of kidney and ureter: Secondary | ICD-10-CM

## 2023-05-02 DIAGNOSIS — Z08 Encounter for follow-up examination after completed treatment for malignant neoplasm: Secondary | ICD-10-CM

## 2023-05-03 DIAGNOSIS — E278 Other specified disorders of adrenal gland: Secondary | ICD-10-CM | POA: Diagnosis not present

## 2023-05-03 LAB — CANCER ANTIGEN 19-9: CA 19-9: 6 U/mL (ref 0–35)

## 2023-05-03 LAB — CEA: CEA: 1.2 ng/mL (ref 0.0–4.7)

## 2023-05-07 ENCOUNTER — Ambulatory Visit: Payer: Medicare Other | Admitting: Urology

## 2023-05-07 LAB — CATECHOLAMINES, FRACTIONATED, PLASMA
Dopamine: <30 pg/mL (ref 0–48)
Epinephrine: <15 pg/mL (ref 0–62)
Norepinephrine: 749 pg/mL (ref 0–874)

## 2023-05-07 LAB — METANEPHRINES, PLASMA
Metanephrine, Free: 38 pg/mL (ref 0.0–88.0)
Normetanephrine, Free: 155.9 pg/mL (ref 0.0–285.2)

## 2023-05-08 ENCOUNTER — Other Ambulatory Visit
Admission: RE | Admit: 2023-05-08 | Discharge: 2023-05-08 | Disposition: A | Discharge status: Home / Self Care | Payer: Medicare Other | Attending: Surgery | Admitting: Surgery

## 2023-05-08 ENCOUNTER — Encounter: Payer: Self-pay | Admitting: Urology

## 2023-05-08 ENCOUNTER — Ambulatory Visit: Payer: Medicare Other | Admitting: Urology

## 2023-05-08 VITALS — BP 144/76 | HR 68 | Ht 71.0 in | Wt 228.6 lb

## 2023-05-08 DIAGNOSIS — D500 Iron deficiency anemia secondary to blood loss (chronic): Secondary | ICD-10-CM | POA: Diagnosis not present

## 2023-05-08 DIAGNOSIS — Z85528 Personal history of other malignant neoplasm of kidney: Secondary | ICD-10-CM | POA: Insufficient documentation

## 2023-05-08 DIAGNOSIS — E278 Other specified disorders of adrenal gland: Secondary | ICD-10-CM | POA: Insufficient documentation

## 2023-05-08 LAB — PROTIME-INR
INR: 1 (ref 0.8–1.2)
Prothrombin Time: 12.9 s (ref 11.4–15.2)

## 2023-05-08 NOTE — Patient Instructions (Signed)
Adrenalectomy  Adrenal glands are organs that make several hormones that your body needs to work. You have two adrenal glands, one above each kidney. An adrenalectomy is a surgery to remove an adrenal gland. You may need this surgery if an adrenal gland is making too much hormone or if you have a tumor on your adrenal gland. There are two kinds of adrenalectomies: Laparoscopic. This is done through small cuts (incisions) with the help of a small, lighted, camera (laparoscope). Sometimes a robot can be used to control the camera and tools. This is called robot-assisted laparoscopic surgery. Open. This is done through one large incision. Tell a health care provider about: Any allergies you have. All medicines you are taking, including vitamins, herbs, eye drops, creams, and over-the-counter medicines. Any problems you or family members have had with anesthetic medicines. Any bleeding problems you have. Any surgeries you have had. Any medical conditions you have. If you are pregnant or may be pregnant. What are the risks? Your health care provider will talk to you about risks. These may include: Infection. Bleeding. Allergic reactions to medicines. Damage to nearby structures or organs. High blood pressure (hypertension) or low blood pressure (hypotension). What happens before the procedure? When to stop eating and drinking Follow instructions from your health care provider about what you may eat and drink. These may include: 8 hours before your procedure Stop eating most foods. Do not eat meat, fried foods, or fatty foods. Eat only light foods, such as toast or crackers. All liquids are okay except energy drinks and alcohol. 6 hours before your procedure Stop eating. Drink only clear liquids, such as water, clear fruit juice, black coffee, plain tea, and sports drinks. Do not drink energy drinks or alcohol. 2 hours before your procedure Stop drinking all liquids. You may be allowed to  take medicines with small sips of water. If you do not follow your health care provider's instructions, your procedure may be delayed or canceled. Medicines Ask your health care provider about: Changing or stopping your regular medicines. These include any diabetes medicines or blood thinners you take. Taking medicines such as aspirin and ibuprofen. These medicines can thin your blood. Do not take these medicines unless your health care provider tells you to. Taking over-the-counter medicines, vitamins, herbs, and supplements. Taking prescription medicines to lower the levels of hormones if the glands are producing too much of them. These hormones include cortisol, aldosterone, or adrenaline. General instructions Do not use any products that contain nicotine or tobacco for at least 4 weeks before the procedure. These products include cigarettes, chewing tobacco, and vaping devices, such as e-cigarettes. If you need help quitting, ask your health care provider. If you will be going home right after the procedure, plan to have a responsible adult: Take you home from the hospital or clinic. You will not be allowed to drive. Care for you for the time you are told. Your health care provider may do blood tests and imaging tests. Ask your health care provider: How your surgical site will be marked or identified. What steps will be taken to help prevent infection. These may include: Removing hair at the surgery site. Washing skin with a soap that kills germs. Taking antibiotic medicine. What happens during the procedure? An IV will be inserted into one of your veins. You may be given: A sedative. This helps you relax. Anesthesia. This will: Numb certain areas of your body. Make you fall asleep for surgery. A small, thin tube (catheter)  may be put into your bladder to drain urine. A tube may be passed through your nose or mouth and into your stomach (NG tube or nasogastric tube). This tube removes  fluid from your stomach. Your surgeon will choose one of the following methods for your surgery. For laparoscopic adrenalectomy: Several small incisions will be made in your abdomen or one of your sides (flank). Surgical instruments (trocars) will be inserted into the small incisions. Your abdomen will be filled with carbon dioxide gas. This will make it easy for your surgeon to view the area more clearly and have more room for the surgery. A laparoscope and other small surgical instruments will be put through the trocars. The camera on the laparoscope will send a picture to a computer screen. A surgical robot may be used to control the laparoscope and instruments from a computer console. The images from the laparoscope will appear on the console. For open adrenalectomy, a large incision will be made under your rib cage, in the middle of your abdomen, or along your side. If needed, some patients have the incision in the middle of their lower back. The adrenal gland will be removed. Your incisions will be closed using stitches (sutures), staples, skin glue, or adhesive strips. A bandage (dressing) will be used to cover the incision or incisions. The procedure may vary among health care providers and hospitals. What happens after the procedure? Your blood pressure, heart rate, breathing rate, and blood oxygen level will be monitored until you leave the hospital or clinic. If a tube was inserted into your bladder or stomach, it will be removed as soon as possible. You may have to wear compression stockings. These stockings help to prevent blood clots and reduce swelling in your legs. You will be given medicines: To treat pain. To regulate certain hormone levels. To control blood pressure. Where to find more information Society of American Gastrointestinal and Endoscopic Surgeons (SAGES): www.sages.org Summary Adrenal glands are organs that make several hormones that your body needs to work. An  adrenalectomy is a surgery to remove an adrenal gland. Follow your health care provider's instructions about eating and drinking, quitting smoking, and taking medicines before the procedure. Your surgery will be done in one of two ways: laparoscopic surgery or open surgery. You will be monitored closely after the surgery. You will also be given medicines to treat pain, regulate certain hormone levels, and control blood pressure. This information is not intended to replace advice given to you by your health care provider. Make sure you discuss any questions you have with your health care provider. Document Revised: 09/07/2021 Document Reviewed: 09/07/2021 Elsevier Patient Education  2024 [employer].

## 2023-05-08 NOTE — Progress Notes (Signed)
05/08/23 1:34 PM   Marya Amsler August 09, 1950 [id number]  CC: Right adrenal mass  HPI: 72 year old male with a number of comorbidities including CAD, CHF, cirrhosis, CKD(eGFR 56), hepatitis C. He has a distant history of renal cell carcinoma treated at [facility] with laparoscopic radical nephrectomy in 2013, recently found to have new 3.5 cm enhancing right adrenal mass on MRI from October 2024.  This was new from prior abdominal ultrasound last year, and last cross-sectional imaging from 2018 with CT abdomen and pelvis with contrast for routine RCC surveillance was benign.  He is already met with oncology as well as general surgery with Dr. Everlene Farrier, and is undergoing metabolic/functional workup, and has a PET scan scheduled for 11/8.  I previously saw him in 2021 for a hydrocele.  He denies any problems after hydrocelectomy.   PMH: Past Medical History:  Diagnosis Date   CAD (coronary artery disease) 2013   per CT scan  - brings CD of CT, no report - will need [facility] records of CT scan report   CHF (congestive heart failure) (HCC)    Cirrhosis of liver (HCC) 01/10/2019   By [place] 03/2020   CKD (chronic kidney disease) stage 3, GFR 30-59 ml/min (HCC) 2015   iatrogenic (decreased nephron mass) established with Dr. Arvin Collard [facility])   Diverticulosis    by CT scan   Hepatitis C virus infection without hepatic coma 03/02/2019   History of hepatitis B virus infection    spontaneously cleared   HTN (hypertension)    Renal cell carcinoma (HCC) 2013   clear cell s/p R nephrectomy   S/p nephrectomy 2013   [facility]   Sinus headache    recurrent    Surgical History: Past Surgical History:  Procedure Laterality Date   BENTALL PROCEDURE N/A 07/05/2015   Procedure: BENTALL PROCEDURE UTILIZING A 28 X 10 MM WOVEN DOUBLE VELOUR VASCULAR GRAFT AND A GELWEAVE WOVEN VASCULAR GRAFT. 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.;  Surgeon: Alleen Borne, MD;  Location: [facility] OR;   Service: Open Heart Surgery;  Laterality: N/A;   CARDIAC VALVE REPLACEMENT     COLONOSCOPY  11/2008   [facility] records in chart [facility])   COLONOSCOPY WITH PROPOFOL N/A 02/24/2019   TA, HP, rpt 5 yrs ([facility])   CORONARY ANGIOPLASTY     ESOPHAGOGASTRODUODENOSCOPY (EGD) WITH PROPOFOL N/A 08/04/2020   Procedure: ESOPHAGOGASTRODUODENOSCOPY (EGD) WITH PROPOFOL;  Surgeon: Wyline Mood, MD;  Location: [facility] ENDOSCOPY;  Service: Gastroenterology;  Laterality: N/A;   HYDROCELE EXCISION Right 02/05/2020   Procedure: HYDROCELECTOMY ADULT;  Surgeon: Sondra Come, MD;  Location: [facility] ORS;  Service: Urology;  Laterality: Right;   NASAL SINUS SURGERY  1978   [facility]   NEPHRECTOMY Right 12/2011   [facility], R for RCC   rectal fistula repair  1980s   TEE WITHOUT CARDIOVERSION N/A 07/05/2015   Procedure: TRANSESOPHAGEAL ECHOCARDIOGRAM (TEE);  Surgeon: Alleen Borne, MD;  Location[facility] OR;  Service: Open Heart Surgery;  Laterality: N/A;    Family History: Family History  Problem Relation Age of Onset   Cancer Paternal Grandmother        throat   Heart disease Mother    Hypertension Mother    Obesity Mother    Heart disease Father    Diabetes Father    Colon polyps Father    Crohn's disease Father    Coronary artery disease Maternal Uncle  MI   Cancer Paternal Aunt        breast    Cancer Paternal Aunt        brain   Colon polyps Brother    CAD Maternal Grandfather    Stroke Neg Hx     Social History:  reports that he has never smoked. He has never been exposed to tobacco smoke. He has never used smokeless tobacco. He reports that he does not currently use alcohol. He reports that he does not use drugs.  Physical Exam: BP (!) 144/76 (BP Location: Left Arm, Patient Position: Sitting, Cuff Size: Large)   Pulse 68   Ht 5\' 11"  (1.803 m)   Wt 228 lb 9.6 oz (103.7 kg)   BMI 31.88 kg/m    Constitutional:  Alert and oriented, No acute distress. Cardiovascular: No clubbing,  cyanosis, or edema. Respiratory: Normal respiratory effort, no increased work of breathing. GI: Abdomen is soft, nontender, nondistended, no abdominal masses   Laboratory Data: Reviewed, see HPI  Pertinent Imaging: I have personally viewed and interpreted the MRI showing a 3.5 cm enhancing right adrenal mass, new from prior imaging, no evidence of metastatic disease.  Assessment & Plan:   72 year old male with history of RCC treated with right radical laparoscopic nephrectomy at [facility] in 2013, found to have a new 3.5 cm enhancing right adrenal mass on MRI from 04/02/2023, high suspicion for malignancy-unclear if this represents recurrence of prior RCC or new adrenocortical carcinoma.  -Agree with functional/endocrine workup, lab work is pending -Will follow-up PET scan -Could consider biopsy, though with his history and rapid growth in the last year, would strongly recommend surgical excision.  Either Dr. Apolinar Junes or myself can be available to assist Dr. Everlene Farrier for minimally invasive adrenalectomy if requested depending on day of scheduling  Legrand Rams, MD 05/08/2023  [facility] [address], [address] [place], [place] [address] [phone]

## 2023-05-10 ENCOUNTER — Ambulatory Visit
Admission: RE | Admit: 2023-05-10 | Discharge: 2023-05-10 | Disposition: A | Discharge status: Home / Self Care | Payer: Medicare Other | Source: Ambulatory Visit | Attending: Oncology | Admitting: Oncology

## 2023-05-10 DIAGNOSIS — Z905 Acquired absence of kidney: Secondary | ICD-10-CM | POA: Diagnosis not present

## 2023-05-10 DIAGNOSIS — C641 Malignant neoplasm of right kidney, except renal pelvis: Secondary | ICD-10-CM | POA: Diagnosis not present

## 2023-05-10 DIAGNOSIS — I700 Atherosclerosis of aorta: Secondary | ICD-10-CM | POA: Insufficient documentation

## 2023-05-10 DIAGNOSIS — E279 Disorder of adrenal gland, unspecified: Secondary | ICD-10-CM | POA: Insufficient documentation

## 2023-05-10 DIAGNOSIS — N2889 Other specified disorders of kidney and ureter: Secondary | ICD-10-CM

## 2023-05-10 DIAGNOSIS — R9389 Abnormal findings on diagnostic imaging of other specified body structures: Secondary | ICD-10-CM

## 2023-05-10 LAB — GLUCOSE, CAPILLARY: Glucose-Capillary: 97 mg/dL (ref 70–99)

## 2023-05-10 MED ORDER — FLUDEOXYGLUCOSE F - 18 (FDG) INJECTION
11.8000 | Freq: Once | INTRAVENOUS | Status: AC | PRN
  Administered 2023-05-10: 12.95 via INTRAVENOUS

## 2023-05-15 ENCOUNTER — Encounter: Payer: Self-pay | Admitting: Surgery

## 2023-05-15 ENCOUNTER — Ambulatory Visit (INDEPENDENT_AMBULATORY_CARE_PROVIDER_SITE_OTHER): Payer: Medicare Other | Admitting: Surgery

## 2023-05-15 VITALS — BP 151/91 | HR 66 | Temp 97.9°F | Ht 71.0 in | Wt 226.2 lb

## 2023-05-15 DIAGNOSIS — Z85528 Personal history of other malignant neoplasm of kidney: Secondary | ICD-10-CM | POA: Diagnosis not present

## 2023-05-15 DIAGNOSIS — E278 Other specified disorders of adrenal gland: Secondary | ICD-10-CM | POA: Diagnosis not present

## 2023-05-15 NOTE — Patient Instructions (Signed)
Our surgery scheduler Britta Mccreedy will call you within 24-48 hours to get you scheduled. If you have not heard from her after 48 hours, please call our office. Have the blue sheet available when she calls to write down important information.   Adrenalectomy  Adrenal glands are organs that make several hormones that your body needs to work. You have two adrenal glands, one above each kidney. An adrenalectomy is a surgery to remove an adrenal gland. You may need this surgery if an adrenal gland is making too much hormone or if you have a tumor on your adrenal gland. There are two kinds of adrenalectomies: Laparoscopic. This is done through small cuts (incisions) with the help of a small, lighted, camera (laparoscope). Sometimes a robot can be used to control the camera and tools. This is called robot-assisted laparoscopic surgery. Open. This is done through one large incision. Tell a health care provider about: Any allergies you have. All medicines you are taking, including vitamins, herbs, eye drops, creams, and over-the-counter medicines. Any problems you or family members have had with anesthetic medicines. Any bleeding problems you have. Any surgeries you have had. Any medical conditions you have. If you are pregnant or may be pregnant. What are the risks? Your health care provider will talk to you about risks. These may include: Infection. Bleeding. Allergic reactions to medicines. Damage to nearby structures or organs. High blood pressure (hypertension) or low blood pressure (hypotension). What happens before the procedure? When to stop eating and drinking Follow instructions from your health care provider about what you may eat and drink. These may include: 8 hours before your procedure Stop eating most foods. Do not eat meat, fried foods, or fatty foods. Eat only light foods, such as toast or crackers. All liquids are okay except energy drinks and alcohol. 6 hours before your  procedure Stop eating. Drink only clear liquids, such as water, clear fruit juice, black coffee, plain tea, and sports drinks. Do not drink energy drinks or alcohol. 2 hours before your procedure Stop drinking all liquids. You may be allowed to take medicines with small sips of water. If you do not follow your health care provider's instructions, your procedure may be delayed or canceled. Medicines Ask your health care provider about: Changing or stopping your regular medicines. These include any diabetes medicines or blood thinners you take. Taking medicines such as aspirin and ibuprofen. These medicines can thin your blood. Do not take these medicines unless your health care provider tells you to. Taking over-the-counter medicines, vitamins, herbs, and supplements. Taking prescription medicines to lower the levels of hormones if the glands are producing too much of them. These hormones include cortisol, aldosterone, or adrenaline. General instructions Do not use any products that contain nicotine or tobacco for at least 4 weeks before the procedure. These products include cigarettes, chewing tobacco, and vaping devices, such as e-cigarettes. If you need help quitting, ask your health care provider. If you will be going home right after the procedure, plan to have a responsible adult: Take you home from the hospital or clinic. You will not be allowed to drive. Care for you for the time you are told. Your health care provider may do blood tests and imaging tests. Ask your health care provider: How your surgical site will be marked or identified. What steps will be taken to help prevent infection. These may include: Removing hair at the surgery site. Washing skin with a soap that kills germs. Taking antibiotic medicine. What happens  during the procedure? An IV will be inserted into one of your veins. You may be given: A sedative. This helps you relax. Anesthesia. This will: Numb certain  areas of your body. Make you fall asleep for surgery. A small, thin tube (catheter) may be put into your bladder to drain urine. A tube may be passed through your nose or mouth and into your stomach (NG tube or nasogastric tube). This tube removes fluid from your stomach. Your surgeon will choose one of the following methods for your surgery. For laparoscopic adrenalectomy: Several small incisions will be made in your abdomen or one of your sides (flank). Surgical instruments (trocars) will be inserted into the small incisions. Your abdomen will be filled with carbon dioxide gas. This will make it easy for your surgeon to view the area more clearly and have more room for the surgery. A laparoscope and other small surgical instruments will be put through the trocars. The camera on the laparoscope will send a picture to a computer screen. A surgical robot may be used to control the laparoscope and instruments from a computer console. The images from the laparoscope will appear on the console. For open adrenalectomy, a large incision will be made under your rib cage, in the middle of your abdomen, or along your side. If needed, some patients have the incision in the middle of their lower back. The adrenal gland will be removed. Your incisions will be closed using stitches (sutures), staples, skin glue, or adhesive strips. A bandage (dressing) will be used to cover the incision or incisions. The procedure may vary among health care providers and hospitals. What happens after the procedure? Your blood pressure, heart rate, breathing rate, and blood oxygen level will be monitored until you leave the hospital or clinic. If a tube was inserted into your bladder or stomach, it will be removed as soon as possible. You may have to wear compression stockings. These stockings help to prevent blood clots and reduce swelling in your legs. You will be given medicines: To treat pain. To regulate certain hormone  levels. To control blood pressure. Where to find more information Society of American Gastrointestinal and Endoscopic Surgeons (SAGES): www.sages.org Summary Adrenal glands are organs that make several hormones that your body needs to work. An adrenalectomy is a surgery to remove an adrenal gland. Follow your health care provider's instructions about eating and drinking, quitting smoking, and taking medicines before the procedure. Your surgery will be done in one of two ways: laparoscopic surgery or open surgery. You will be monitored closely after the surgery. You will also be given medicines to treat pain, regulate certain hormone levels, and control blood pressure. This information is not intended to replace advice given to you by your health care provider. Make sure you discuss any questions you have with your health care provider. Document Revised: 09/07/2021 Document Reviewed: 09/07/2021 Elsevier Patient Education  2024 [employer].

## 2023-05-16 ENCOUNTER — Telehealth: Payer: Self-pay | Admitting: Surgery

## 2023-05-16 NOTE — Telephone Encounter (Signed)
Patient has been advised of Pre-Admission date/time, and Surgery date at [facility].  Surgery Date: 06/13/23 Preadmission Testing Date: 06/06/23 (phone 1p-4p)  Patient has been made aware to call [phone], between 1-3:00pm the day before surgery, to find out what time to arrive for surgery.

## 2023-05-17 ENCOUNTER — Ambulatory Visit: Payer: Medicare Other | Admitting: Urology

## 2023-05-17 LAB — SPECIMEN STATUS REPORT

## 2023-05-17 LAB — CATECHOLAMINE+VMA, 24-HR URINE
VMA, 24H Ur Adult: 5.7 mg/(24.h) (ref 0.0–7.5)
VMA, Urine: 2.2 mg/L

## 2023-05-17 LAB — METANEPHRINES, URINE, 24 HOUR
Metaneph Total, Ur: 75 ug/L
Metanephrines, 24H Ur: 195 ug/(24.h) (ref 58–276)
Normetanephrine, 24H Ur: 437 ug/(24.h) (ref 156–729)
Normetanephrine, Ur: 168 ug/L

## 2023-05-18 NOTE — Progress Notes (Addendum)
Outpatient Surgical Follow Up    Aaron Cole is an 72 y.o. male.   Chief Complaint  Patient presents with   Follow-up    Right adrenal mass    HPI: Aaron Cole is a 72 y.o. male seen in consultation for possible Right adrenal mass vs recurrence. 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  2017 had Bentall procedure ( aortic root and valve replacement) by Dr. Jules Schick [facility]. He was following with Dr. Tobi Bastos for his liver cirrhosis and part of the workup was a right upper quadrant ultrasound to screen for hepatocellular carcinoma given that he had hepatitis C.  Latest ultrasound showed evidence of a potential adrenal mass.  This was followed by an MRI.  Please note that I have personally reviewed the images and actually show when they will the images.  There is evidence of 3.5 cm mass and enhances.  He had biochemical w/u r/o functional adrenal tumor which is negative, he also had PET CT I pers reviewed showing only uptake on the right adrenal fossa.   Past Medical History:  Diagnosis Date   CAD (coronary artery disease) 2013   per CT scan  - brings CD of CT, no report - will need [facility] records of CT scan report   CHF (congestive heart failure) (HCC)    Cirrhosis of liver (HCC) 01/10/2019   By [place] 03/2020   CKD (chronic kidney disease) stage 3, GFR 30-59 ml/min (HCC) 2015   iatrogenic (decreased nephron mass) established with Dr. Arvin [facility])   Diverticulosis    by CT scan   Hepatitis C virus infection without hepatic coma 03/02/2019   History of hepatitis B virus infection    spontaneously cleared   HTN (hypertension)    Renal cell carcinoma (HCC) 2013   clear cell s/p R nephrectomy   S/p nephrectomy 2013   [facility]   Sinus headache    recurrent    Past Surgical History:  Procedure Laterality Date   BENTALL PROCEDURE N/A 07/05/2015   Procedure: BENTALL PROCEDURE UTILIZING  A 28 X 10 MM WOVEN DOUBLE VELOUR VASCULAR GRAFT AND A GELWEAVE WOVEN VASCULAR GRAFT. 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.;  Surgeon: Alleen Borne, MD;  Location: [facility] OR;  Service: Open Heart Surgery;  Laterality: N/A;   CARDIAC VALVE REPLACEMENT     COLONOSCOPY  11/2008   [facility]NL records in chart [facility])   COLONOSCOPY WITH PROPOFOL N/A 02/24/2019   TA, HP, rpt 5 yrs ([facility])   CORONARY ANGIOPLASTY     ESOPHAGOGASTRODUODENOSCOPY (EGD) WITH PROPOFOL N/A 08/04/2020   Procedure: ESOPHAGOGASTRODUODENOSCOPY (EGD) WITH PROPOFOL;  Surgeon: Wyline Mood, MD;  Location: [facility] ENDOSCOPY;  Service: Gastroenterology;  Laterality: N/A;   HYDROCELE EXCISION Right 02/05/2020   Procedure: HYDROCELECTOMY ADULT;  Surgeon: Sondra Come, MD;  Location: [facility] ORS;  Service: Urology;  Laterality: Right;   NASAL SINUS SURGERY  1978   sinus surgery done at [facility]   NEPHRECTOMY Right 12/2011   [facility], R for RCC   rectal fistula repair  1980s   TEE WITHOUT CARDIOVERSION N/A 07/05/2015   Procedure: TRANSESOPHAGEAL ECHOCARDIOGRAM (TEE);  Surgeon: Alleen Borne, MD;  [facility] OR;  Service: Open Heart Surgery;  Laterality: N/A;    Family History  Problem Relation Age of Onset   Cancer Paternal Grandmother  throat   Heart disease Mother    Hypertension Mother    Obesity Mother    Heart disease Father    Diabetes Father    Colon polyps Father    Crohn's disease Father    Coronary artery disease Maternal Uncle        MI   Cancer Paternal Aunt        breast    Cancer Paternal Aunt        brain   Colon polyps Brother    CAD Maternal Grandfather    Stroke Neg Hx     Social History:  reports that he has never smoked. He has never been exposed to tobacco smoke. He has never used smokeless tobacco. He reports that he does not currently use alcohol. He reports that he does not use drugs.  Allergies:  Allergies  Allergen Reactions   Carvedilol Other (See  Comments)    Blurred vision, drowsiness   Metoprolol Diarrhea    Medications reviewed.    ROS Full ROS performed and is otherwise negative other than what is stated in HPI   BP (!) 151/91 (BP Location: Left Arm, Patient Position: Sitting, Cuff Size: Large)   Pulse 66   Temp 97.9 F (36.6 C) (Oral)   Ht 5\' 11"  (1.803 m)   Wt 226 lb 3.2 oz (102.6 kg)   SpO2 97%   BMI 31.55 kg/m   Physical Exam m   CONSTITUTIONAL: NAD. EYES: Pupils are equal, round, , Sclera are non-icteric. EARS, NOSE, MOUTH AND THROAT: The oropharynx is clear. The oral mucosa is pink and moist. Hearing is intact to voice. LYMPH NODES:  Lymph nodes in the neck are normal. RESPIRATORY:  Lungs are clear. There is normal respiratory effort, with equal breath sounds bilaterally, and without pathologic use of accessory muscles. CARDIOVASCULAR: Heart is regular without murmurs, gallops, or rubs. GI: The abdomen is  soft, nontender, and nondistended. There are no palpable masses. There is no hepatosplenomegaly. There are normal bowel sounds I. Prior laparoscopic incisions GU: Rectal deferred.   MUSCULOSKELETAL: Normal muscle strength and tone. No cyanosis or edema.   SKIN: Turgor is good and there are no pathologic skin lesions or ulcers. NEUROLOGIC: Motor and sensation is grossly normal. Cranial nerves are grossly intact. PSYCH:  Oriented to person, place and time. Affect is normal.    Assessment/Plan: 72 year old male with right adrenal mass consistent with carcinoma versus potential recurrence of renal cell.  On PET/CT only lesion seem to be on the right adrenal.  Biochemical markers are not consistent with functional pheochromocytoma a biopsy is indicated to make sure that were not dealing with any potential other metastatic disease from different sites.  I do think that he will likely require surgical intervention and I think that we can do it robotically.  I have discussed the case in detail with Dr. Richardo Hanks and  this will be a combined case.  I will arrange for CT-guided biopsy if possible.  If radiology feels that this is not safe we may have to proceed with excision.  Procedure discussed with the patient in detail and my thought process discussed with him.  There is the benefit and the possible complications.  He is cirrhotic but he is type a and he is very compensated.  I do think that the benefits of fixations outweighed the risk of cancer progressing to a metastatic and unresectable lesion.  He understands.  I did explain to him the risk including bleeding, infection, cardiovascular  issues and also potential hepatic failure.  He seems to understand.  We will also make arrangements for him to see cardiology. I spent greater than 50 minutes in this encounter including personally reviewing imaging studies, coordinating his care, placing orders and performing documentation  Sterling Big, MD [facility] General Surgeon

## 2023-05-20 ENCOUNTER — Telehealth: Payer: Self-pay | Admitting: *Deleted

## 2023-05-20 ENCOUNTER — Inpatient Hospital Stay: Payer: Medicare Other | Attending: Oncology | Admitting: Oncology

## 2023-05-20 ENCOUNTER — Telehealth: Payer: Self-pay

## 2023-05-20 ENCOUNTER — Encounter: Payer: Self-pay | Admitting: Oncology

## 2023-05-20 VITALS — BP 125/76 | HR 67 | Temp 97.4°F | Resp 16 | Ht 71.0 in | Wt 225.0 lb

## 2023-05-20 DIAGNOSIS — N183 Chronic kidney disease, stage 3 unspecified: Secondary | ICD-10-CM | POA: Insufficient documentation

## 2023-05-20 DIAGNOSIS — I130 Hypertensive heart and chronic kidney disease with heart failure and stage 1 through stage 4 chronic kidney disease, or unspecified chronic kidney disease: Secondary | ICD-10-CM | POA: Insufficient documentation

## 2023-05-20 DIAGNOSIS — R1900 Intra-abdominal and pelvic swelling, mass and lump, unspecified site: Secondary | ICD-10-CM | POA: Diagnosis not present

## 2023-05-20 DIAGNOSIS — Z85528 Personal history of other malignant neoplasm of kidney: Secondary | ICD-10-CM | POA: Insufficient documentation

## 2023-05-20 DIAGNOSIS — E278 Other specified disorders of adrenal gland: Secondary | ICD-10-CM

## 2023-05-20 NOTE — Progress Notes (Signed)
[facility]  [phone] [phone]  ID: Aaron Cole OB: 1951/06/19  MR#: [id number]  QIO#:[id number]  Patient Care Team: Eustaquio Boyden, MD as PCP - General (Family Medicine) Lyn Records, MD (Inactive) as PCP - Cardiology (Cardiology) Lanier Prude, MD as PCP - Electrophysiology (Cardiology) Jeralyn Ruths, MD as Consulting Physician (Oncology)  CHIEF COMPLAINT: Abdominal mass, history of right renal cell carcinoma.  INTERVAL HISTORY: Patient returns to clinic today for further evaluation and discussion of his laboratory and imaging results.  He had consultation with surgery last week who plans to do a CT-guided biopsy or robotic removal of the mass.  Patient currently feels well and is asymptomatic.  He has no neurologic complaints.  He denies any recent fevers or illnesses.  He has a good appetite and denies weight loss.  He has no chest pain, shortness of breath, cough, or hemoptysis.  He denies any nausea, vomiting, constipation, or diarrhea.  He has no urinary complaints.  Patient offers no specific complaints today.  REVIEW OF SYSTEMS:   Review of Systems  Constitutional: Negative.  Negative for fever, malaise/fatigue and weight loss.  Respiratory: Negative.  Negative for cough, hemoptysis and shortness of breath.   Cardiovascular: Negative.  Negative for chest pain and leg swelling.  Gastrointestinal: Negative.  Negative for abdominal pain, blood in stool and melena.  Genitourinary: Negative.  Negative for flank pain and frequency.  Musculoskeletal: Negative.  Negative for back pain.  Skin: Negative.  Negative for rash.  Neurological: Negative.  Negative for dizziness, focal weakness, weakness and headaches.  Psychiatric/Behavioral: Negative.  The patient is not nervous/anxious.     As per HPI. Otherwise, a complete review of systems is negative.  PAST MEDICAL HISTORY: Past Medical History:  Diagnosis Date   CAD  (coronary artery disease) 2013   per CT scan  - brings CD of CT, no report - will need [facility] records of CT scan report   CHF (congestive heart failure) (HCC)    Cirrhosis of liver (HCC) 01/10/2019   By [place] 03/2020   CKD (chronic kidney disease) stage 3, GFR 30-59 ml/min (HCC) 2015   iatrogenic (decreased nephron mass) established with Dr. Arvin Collard [facility])   Diverticulosis    by CT scan   Hepatitis C virus infection without hepatic coma 03/02/2019   History of hepatitis B virus infection    spontaneously cleared   HTN (hypertension)    Renal cell carcinoma (HCC) 2013   clear cell s/p R nephrectomy   S/p nephrectomy 2013   [facility]   Sinus headache    recurrent    PAST SURGICAL HISTORY: Past Surgical History:  Procedure Laterality Date   BENTALL PROCEDURE N/A 07/05/2015   Procedure: BENTALL PROCEDURE UTILIZING A 28 X 10 MM WOVEN DOUBLE VELOUR VASCULAR GRAFT AND A GELWEAVE WOVEN VASCULAR GRAFT. 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.;  Surgeon: Alleen Borne, MD;  Location: [facility] OR;  Service: Open Heart Surgery;  Laterality: N/A;   CARDIAC VALVE REPLACEMENT     COLONOSCOPY  11/2008   WNL records in chart [facility])   COLONOSCOPY WITH PROPOFOL N/A 02/24/2019   TA, HP, rpt 5 yrs ([facility])   CORONARY ANGIOPLASTY     ESOPHAGOGASTRODUODENOSCOPY (EGD) WITH PROPOFOL N/A 08/04/2020   Procedure: ESOPHAGOGASTRODUODENOSCOPY (EGD) WITH PROPOFOL;  Surgeon: Wyline Mood, MD;  Location: [facility] ENDOSCOPY;  Service: Gastroenterology;  Laterality: N/A;   HYDROCELE EXCISION Right 02/05/2020   Procedure: HYDROCELECTOMY ADULT;  Surgeon: Sondra Come, MD;  Location: [facility] ORS;  Service: Urology;  Laterality: Right;   NASAL SINUS SURGERY  1978   sin[facility] at [facility]   NEPHRECTOMY Right 12/2011   [facility], R for RCC   rectal fistula repair  1980s   TEE WITHOUT CARDIOVERSION N/A 07/05/2015   Procedure: TRANSESOPHAGEAL ECHOCARDIOGRAM (TEE);  Surgeon: Alleen Borne, MD;   Location: [facility] OR;  Service: Open Heart Surgery;  Laterality: N/A;    FAMILY HISTORY: Family History  Problem Relation Age of Onset   Cancer Paternal Grandmother        throat   Heart disease Mother    Hypertension Mother    Obesity Mother    Heart disease Father    Diabetes Father    Colon polyps Father    Crohn's disease Father    Coronary artery disease Maternal Uncle        MI   Cancer Paternal Aunt        breast    Cancer Paternal Aunt        brain   Colon polyps Brother    CAD Maternal Grandfather    Stroke Neg Hx     ADVANCED DIRECTIVES (Y/N):  N  HEALTH MAINTENANCE: Social History   Tobacco Use   Smoking status: Never    Passive exposure: Never   Smokeless tobacco: Never  Vaping Use   Vaping status: Never Used  Substance Use Topics   Alcohol use: Not Currently   Drug use: Never     Colonoscopy:  PAP:  Bone density:  Lipid panel:  Allergies  Allergen Reactions   Carvedilol Other (See Comments)    Blurred vision, drowsiness   Metoprolol Diarrhea    Current Outpatient Medications  Medication Sig Dispense Refill   aspirin EC 81 MG tablet Take 81 mg by mouth once.     fluticasone (FLONASE) 50 MCG/ACT nasal spray INHALE 2 SPRAYS INTO EACH NOSTRIL EVERY DAY. 16 g 3   sacubitril-valsartan (ENTRESTO) 49-51 MG Take 1 tablet by mouth 2 (two) times daily. 180 tablet 3   No current facility-administered medications for this visit.    OBJECTIVE: Vitals:   05/20/23 1326  BP: 125/76  Pulse: 67  Resp: 16  Temp: (!) 97.4 F (36.3 C)  SpO2: 100%     Body mass index is 31.38 kg/m.    ECOG FS:0 - Asymptomatic  General: Well-developed, well-nourished, no acute distress. Eyes: Pink conjunctiva, anicteric sclera. HEENT: Normocephalic, moist mucous membranes. Lungs: No audible wheezing or coughing. Heart: Regular rate and rhythm. Abdomen: Soft, nontender, no obvious distention. Musculoskeletal: No edema, cyanosis, or clubbing. Neuro: Alert, answering all  questions appropriately. Cranial nerves grossly intact. Skin: No rashes or petechiae noted. Psych: Normal affect.  LAB RESULTS:  Lab Results  Component Value Date   NA 140 04/01/2023   K 4.1 04/01/2023   CL 106 04/01/2023   CO2 25 04/01/2023   GLUCOSE 100 (H) 04/01/2023   BUN 16 04/01/2023   CREATININE 1.29 04/01/2023   CALCIUM 9.5 04/01/2023   PROT 7.3 04/01/2023   ALBUMIN 4.2 04/01/2023   AST 17 04/01/2023   ALT 11 04/01/2023   ALKPHOS 64 04/01/2023   BILITOT 0.7 04/01/2023   GFRNONAA 54 (L) 05/16/2020   GFRAA 62 05/16/2020    Lab Results  Component Value Date   WBC 8.1 03/11/2023   NEUTROABS 4.8 03/11/2023   HGB 14.1 03/11/2023   HCT 43.3 03/11/2023   MCV 90 03/11/2023  PLT 181 03/11/2023     STUDIES: No results found.  ASSESSMENT: Abdominal mass, history of right renal cell carcinoma.  PLAN:    History of right renal cell carcinoma: Patient underwent total nephrectomy at [facility] in 2013.  All imaging up until recently revealed no evidence of recurrence and patient was subsequently discharged from clinic. Abdominal mass: Pheochromocytoma labs are negative.  PET scan results are pending at time of dictation, but it appears mass is mildly hypermetabolic with no evidence of other disease.  Case discussed with surgery.  CT-guided biopsy has been ordered to confirm diagnosis.  Although there may be a possibility we will proceed directly to surgical removal.  Surgery tentatively scheduled for June 13, 2023.  Patient will follow-up 1 week later to discuss his final pathology results and any additional treatment is necessary.    I spent a total of 20 minutes reviewing chart data, face-to-face evaluation with the patient, counseling and coordination of care as detailed above.    Patient expressed understanding and was in agreement with this plan. He also understands that He can call clinic at any time with any questions, concerns, or complaints.    Cancer Staging  No  matching staging information was found for the patient.   Jeralyn Ruths, MD   05/20/2023 3:44 PM

## 2023-05-20 NOTE — Telephone Encounter (Signed)
 Pt has been scheduled tele pre op appt 06/04/23. Med rec and consent are done.

## 2023-05-20 NOTE — Telephone Encounter (Signed)
   Name: Aaron Cole  DOB: 02-22-1951  MRN: [id number]  Primary Cardiologist: Lesleigh Noe, MD (Inactive)   Preoperative team, please contact this patient and set up a phone call appointment for further preoperative risk assessment. Please obtain consent and complete medication review. Thank you for your help.  I confirm that guidance regarding antiplatelet and oral anticoagulation therapy has been completed and, if necessary, noted below.  Patient can hold ASA 81 mg 7 days prior to procedure and should restart postprocedure when surgically safe and guided by surgery team.  I also confirmed the patient resides in the state of [place]. As per [facility] Medical Board telemedicine laws, the patient must reside in the state in which the provider is licensed.   [name], [name], NP 05/20/2023, 11:36 AM  [facility]

## 2023-05-20 NOTE — Telephone Encounter (Signed)
Pt has been scheduled tele pre op appt 06/04/23. Med rec and consent are done.     Patient Consent for Virtual Visit        [name] has provided verbal consent on 05/20/2023 for a virtual visit (video or telephone).   CONSENT FOR VIRTUAL VISIT FOR:  Aaron Cole  By participating in this virtual visit I agree to the following:  I hereby voluntarily request, consent and authorize [facility] and its employed or contracted physicians, physician assistants, nurse practitioners or other licensed health care professionals (the Practitioner), to provide me with telemedicine health care services (the "Services") as deemed necessary by the treating Practitioner. I acknowledge and consent to receive the Services by the Practitioner via telemedicine. I understand that the telemedicine visit will involve communicating with the Practitioner through live audiovisual communication technology and the disclosure of certain medical information by electronic transmission. I acknowledge that I have been given the opportunity to request an in-person assessment or other available alternative prior to the telemedicine visit and am voluntarily participating in the telemedicine visit.  I understand that I have the right to withhold or withdraw my consent to the use of telemedicine in the course of my care at any time, without affecting my right to future care or treatment, and that the Practitioner or I may terminate the telemedicine visit at any time. I understand that I have the right to inspect all information obtained and/or recorded in the course of the telemedicine visit and may receive copies of available information for a reasonable fee.  I understand that some of the potential risks of receiving the Services via telemedicine include:  Delay or interruption in medical evaluation due to technological equipment failure or disruption; Information transmitted may not be sufficient (e.g. poor  resolution of images) to allow for appropriate medical decision making by the Practitioner; and/or  In rare instances, security protocols could fail, causing a breach of personal health information.  Furthermore, I acknowledge that it is my responsibility to provide information about my medical history, conditions and care that is complete and accurate to the best of my ability. I acknowledge that Practitioner's advice, recommendations, and/or decision may be based on factors not within their control, such as incomplete or inaccurate data provided by me or distortions of diagnostic images or specimens that may result from electronic transmissions. I understand that the practice of medicine is not an exact science and that Practitioner makes no warranties or guarantees regarding treatment outcomes. I acknowledge that a copy of this consent can be made available to me via my patient portal [facility] MyChart), or I can request a printed copy by calling the office of  [facility].    I understand that my insurance will be billed for this visit.   I have read or had this consent read to me. I understand the contents of this consent, which adequately explains the benefits and risks of the Services being provided via telemedicine.  I have been provided ample opportunity to ask questions regarding this consent and the Services and have had my questions answered to my satisfaction. I give my informed consent for the services to be provided through the use of telemedicine in my medical care

## 2023-05-20 NOTE — Telephone Encounter (Signed)
Cardiac clearance has been faxed to Dr. Steffanie Dunn at [phone].

## 2023-05-20 NOTE — Telephone Encounter (Signed)
...     Pre-operative Risk Assessment    Patient Name: Aaron Cole  DOB: [birth year] MRN: [id number]      Request for Surgical Clearance    Procedure:   robotic adrenalectomy  Date of Surgery:  Clearance 06/13/23                                 Surgeon:  dr Cecelia Byars pabon Surgeon's Group or Practice Name:  [facility] Phone number:  [phone] Fax number:  [phone]   Type of Clearance Requested:   - Medical  - Pharmacy:  Hold Aspirin     Type of Anesthesia:  Not Indicated   Additional requests/questions:   last o/v 02/06/23. No new appt  Signed, Renee Ramus   05/20/2023, 11:25 AM

## 2023-05-27 ENCOUNTER — Other Ambulatory Visit: Payer: Self-pay

## 2023-05-29 ENCOUNTER — Telehealth: Payer: Self-pay

## 2023-05-29 NOTE — Telephone Encounter (Signed)
Put  a reminder for 1 year and called patient and patient verbalized understanding of results

## 2023-05-29 NOTE — Progress Notes (Signed)
Patient for CT guided RT Adrenal Mass Biopsy on Mon 06/03/23, I called and spoke with the patient on the phone and gave pre-procedure instructions. Pt was made aware to be here at 10a, last dose of ASA 81mg  was on Tues 11/26, NPO after MN prior to procedure as well as driver post procedure/recovery/discharge. Pt stated understanding.  Called 05/29/2023

## 2023-05-29 NOTE — Progress Notes (Signed)
Aaron Dance, MD sent to Aaron Cole for CT bx RT adrenal mass vs met.  H/o RCCA  TS

## 2023-05-29 NOTE — Telephone Encounter (Signed)
-----   Message from Wyline Mood sent at 05/28/2023 11:51 AM EST ----- Please recall for MRI of the pancreas 1 year recall please to ensure cyst is stable

## 2023-05-31 ENCOUNTER — Other Ambulatory Visit: Payer: Self-pay | Admitting: Student

## 2023-05-31 DIAGNOSIS — E278 Other specified disorders of adrenal gland: Secondary | ICD-10-CM

## 2023-06-02 ENCOUNTER — Encounter (HOSPITAL_COMMUNITY): Payer: Self-pay | Admitting: Radiology

## 2023-06-02 NOTE — H&P (Signed)
Chief Complaint: Hypermetabolic adrenal mass.. Request is for right adrenal biopsy   Referring Physician(s): Pabon,Diego F  Supervising Physician: Malachy Moan  Patient Status: [facility] - Out-pt  History of Present Illness: Aaron Cole is a 73 y.o. male outpatient. History of HTN, Hep C, cirrohois, CHF, CAD, CKD, right renal cell carcinoma. Found other have an adrenal mass during [place] screening for HCC. PET scan from 1.22.24.  PET scan reads 3.6 cm hypermetabolic right adrenal mass. Patient presents for adrenal mass biopsy for further evaluation of metastasis or adrenal cortical carcinoma. Patient is scheduled for a right adrenalectomy on 12.12.24  Currently without any significant complaints. Patient alert and laying in bed,calm. Denies any fevers, headache, chest pain, SOB, cough, abdominal pain, nausea, vomiting or bleeding.    Return precautions and treatment recommendations and follow-up discussed with the patient *** who is agreeable with the plan.   *** Labs pending. Patient is on 81 mg of ASA. No pertinent allergies. Patient has been NPO since midnight.    Past Medical History:  Diagnosis Date   CAD (coronary artery disease) 2013   per CT scan  - brings CD of CT, no report - will need [facility] records of CT scan report   CHF (congestive heart failure) (HCC)    Cirrhosis of liver (HCC) 01/10/2019   By [place] 03/2020   CKD (chronic kidney disease) stage 3, GFR 30-59 ml/min (HCC) 2015   iatrogenic (decreased nephron mass) established with Dr. Arvin Collard [facility])   Diverticulosis    by CT scan   Hepatitis C virus infection without hepatic coma 03/02/2019   History of hepatitis B virus infection    spontaneously cleared   HTN (hypertension)    Renal cell carcinoma (HCC) 2013   clear cell s/p R nephrectomy   S/p nephrectomy 2013   [facility]   Sinus headache    recurrent    Past Surgical History:  Procedure Laterality Date   BENTALL PROCEDURE N/A 07/05/2015   Procedure: BENTALL  PROCEDURE UTILIZING A 28 X 10 MM WOVEN DOUBLE VELOUR VASCULAR GRAFT AND A GELWEAVE WOVEN VASCULAR GRAFT. 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.;  Surgeon: Alleen Borne, MD;  Location: [facility] OR;  Service: Open Heart Surgery;  Laterality: N/A;   CARDIAC VALVE REPLACEMENT     COLONOSCOPY  11/2008   WNL records in chart [facility])   COLONOSCOPY WITH PROPOFOL N/A 02/24/2019   TA, HP, rpt 5 yrs ([facility])   CORONARY ANGIOPLASTY     ESOPHAGOGASTRODUODENOSCOPY (EGD) WITH PROPOFOL N/A 08/04/2020   Procedure: ESOPHAGOGASTRODUODENOSCOPY (EGD) WITH PROPOFOL;  Surgeon: Wyline Mood, MD;  Location: [facility] ENDOSCOPY;  Service: Gastroenterology;  Laterality: N/A;   HYDROCELE EXCISION Right 02/05/2020   Procedure: HYDROCELECTOMY ADULT;  Surgeon: Sondra Come, MD;  Location: [facility] ORS;  Service: Urology;  Laterality: Right;   NASAL SINUS SURGERY  1978   [facility]   NEPHRECTOMY Right 12/2011   [facility], R for RCC   rectal fistula repair  1980s   TEE WITHOUT CARDIOVERSION N/A 07/05/2015   Procedure: TRANSESOPHAGEAL ECHOCARDIOGRAM (TEE);  Surgeon: Alleen Borne, MD;  Location: [facility] OR;  Service: Open Heart Surgery;  Laterality: N/A;    Allergies: Carvedilol and Metoprolol  Medications: Prior to Admission medications   Medication Sig Start Date End Date Taking? Authorizing Provider  aspirin EC 81 MG tablet Take 81 mg by mouth once.    [provider]  fluticasone (FLONASE) 50 MCG/ACT nasal  spray INHALE 2 SPRAYS INTO EACH NOSTRIL EVERY DAY. 06/27/16   Eustaquio Boyden, MD  sacubitril-valsartan (ENTRESTO) 49-51 MG Take 1 tablet by mouth 2 (two) times daily. 08/29/22   Lanier Prude, MD     Family History  Problem Relation Age of Onset   Cancer Paternal Grandmother        throat   Heart disease Mother    Hypertension Mother    Obesity Mother    Heart disease Father    Diabetes Father    Colon polyps Father    Crohn's disease Father     Coronary artery disease Maternal Uncle        MI   Cancer Paternal Aunt        breast    Cancer Paternal Aunt        brain   Colon polyps Brother    CAD Maternal Grandfather    Stroke Neg Hx     Social History   Socioeconomic History   Marital status: Single    Spouse name: Not on file   Number of children: Not on file   Years of education: Not on file   Highest education level: Not on file  Occupational History   Not on file  Tobacco Use   Smoking status: Never    Passive exposure: Never   Smokeless tobacco: Never  Vaping Use   Vaping status: Never Used  Substance and Sexual Activity   Alcohol use: Not Currently   Drug use: Never   Sexual activity: Not Currently    Birth control/protection: Abstinence  Other Topics Concern   Not on file  Social History Narrative   Caffeine: occasional   Lives alone, 1 cat   Occupation: farming business   Edu: BS   Activity: bikes 3-4x/wk about 1 hour   Diet: good water, fruits/vegetables daily, steak 1x/wk, fish 2x/wk   Social Determinants of Health   Financial Resource Strain: Low Risk  (05/02/2023)   Overall Financial Resource Strain (CARDIA)    Difficulty of Paying Living Expenses: Not hard at all  Food Insecurity: No Food Insecurity (05/02/2023)   Hunger Vital Sign    Worried About Running Out of Food in the Last Year: Never true    Ran Out of Food in the Last Year: Never true  Transportation Needs: No Transportation Needs (05/02/2023)   PRAPARE - Administrator, Civil Service (Medical): No    Lack of Transportation (Non-Medical): No  Physical Activity: Insufficiently Active (01/22/2020)   Exercise Vital Sign    Days of Exercise per Week: 4 days    Minutes of Exercise per Session: 20 min  Stress: No Stress Concern Present (01/22/2020)   [employer] of Occupational Health - Occupational Stress Questionnaire    Feeling of Stress : Not at all  Social Connections: Not on file    ECOG Status: {CHL ONC  ECOG XB:[id number]}  Review of Systems: A 12 point ROS discussed and pertinent positives are indicated in the HPI above.  All other systems are negative.  Review of Systems  Vital Signs: There were no vitals taken for this visit.  Advance Care Plan: {Advance Care UUVO:53664}    Physical Exam  Imaging: NM PET Image Initial (PI) Skull Base To Thigh  Result Date: 05/24/2023 CLINICAL DATA:  Subsequent treatment strategy for right renal cell carcinoma. New right adrenal mass. EXAM: NUCLEAR MEDICINE PET SKULL BASE TO THIGH TECHNIQUE: 13.0 mCi F-18 FDG was injected intravenously. Full-ring PET imaging was  performed from the skull base to thigh after the radiotracer. CT data was obtained and used for attenuation correction and anatomic localization. Fasting blood glucose: 97 mg/dl COMPARISON:  MRI abdomen dated 04/02/2023 FINDINGS: Mediastinal blood pool activity: SUV max 3.0 Liver activity: SUV max NA NECK: No hypermetabolic lymph nodes in the neck. Incidental CT findings: None. CHEST: No hypermetabolic mediastinal or hilar nodes. No suspicious pulmonary nodules on the CT scan. Incidental CT findings: Aortic valve replacement with ascending aortic graft. Atherosclerotic calcifications of the arch. Mitral valve annular calcifications. ABDOMEN/PELVIS: 3.6 x 2.9 cm right adrenal mass (series 6/image 92), max SUV 9.3. Status post right nephrectomy. No abnormal hypermetabolism in the liver, spleen, pancreas, or left adrenal gland. No hypermetabolic abdominopelvic lymphadenopathy. Incidental CT findings: Atherosclerotic calcifications of the abdominal aorta and branch vessels. SKELETON: No focal hypermetabolic activity to suggest skeletal metastasis. Incidental CT findings: Degenerative changes of the thoracic spine. Median sternotomy. IMPRESSION: 3.6 cm hypermetabolic right adrenal mass. Differential considerations include adrenal metastasis (favored) versus adrenal cortical carcinoma. Status post right  nephrectomy. Otherwise, no findings suspicious for metastatic disease. Electronically Signed   By: Charline Bills M.D.   On: 05/24/2023 00:13    Labs:  CBC: Recent Labs    03/11/23 1549  WBC 8.1  HGB 14.1  HCT 43.3  PLT 181    COAGS: Recent Labs    03/11/23 1549 05/08/23 1406  INR 1.0 1.0    BMP: Recent Labs    03/11/23 1549 04/01/23 0732  NA 139 140  K 4.7 4.1  CL 105 106  CO2 20 25  GLUCOSE 102* 100*  BUN 27 16  CALCIUM 9.4 9.5  CREATININE 1.71* 1.29    LIVER FUNCTION TESTS: Recent Labs    03/11/23 1549 04/01/23 0732  BILITOT 0.5 0.7  AST 18 17  ALT 10 11  ALKPHOS 72 64  PROT 7.0 7.3  ALBUMIN 4.3 4.2     Assessment and Plan:  72 y.o. male. outpatient. History of HTN, Hep C, cirrohois, CHF, CAD, CKD, right renal cell carcinoma. Found other have an adrenal mass during [place] screening for HCC. PET scan from 1.22.24.  PET scan reads 3.6 cm hypermetabolic right adrenal mass. Patient presents for adrenal mass biopsy for further evaluation of metastasis or adrenal cortical carcinoma.  Risks and benefits of adrenal mass biopsy was discussed with the patient and/or patient's family including, but not limited to bleeding, infection, damage to adjacent structures or low yield requiring additional tests.  All of the questions were answered and there is agreement to proceed.  Consent signed and in chart.   Thank you for this interesting consult.  I greatly enjoyed meeting MARIBEL PEZZULLO and look forward to participating in their care.  A copy of this report was sent to the requesting provider on this date.  Electronically Signed: Alene Mires, NP 06/02/2023, 5:19 PM   I spent a total of {New YNWG:[id number]} {New Out-Pt:[id number]}  {Established Out-Pt:304952003} in face to face in clinical consultation, greater than 50% of which was counseling/coordinating care for ***

## 2023-06-03 ENCOUNTER — Ambulatory Visit
Admission: RE | Admit: 2023-06-03 | Discharge: 2023-06-03 | Disposition: A | Discharge status: Home / Self Care | Payer: Medicare Other | Source: Ambulatory Visit | Attending: Surgery | Admitting: Surgery

## 2023-06-03 ENCOUNTER — Other Ambulatory Visit: Payer: Self-pay

## 2023-06-03 DIAGNOSIS — C641 Malignant neoplasm of right kidney, except renal pelvis: Secondary | ICD-10-CM | POA: Diagnosis not present

## 2023-06-03 DIAGNOSIS — I130 Hypertensive heart and chronic kidney disease with heart failure and stage 1 through stage 4 chronic kidney disease, or unspecified chronic kidney disease: Secondary | ICD-10-CM | POA: Insufficient documentation

## 2023-06-03 DIAGNOSIS — E279 Disorder of adrenal gland, unspecified: Secondary | ICD-10-CM | POA: Insufficient documentation

## 2023-06-03 DIAGNOSIS — E278 Other specified disorders of adrenal gland: Secondary | ICD-10-CM

## 2023-06-03 DIAGNOSIS — N183 Chronic kidney disease, stage 3 unspecified: Secondary | ICD-10-CM | POA: Diagnosis not present

## 2023-06-03 DIAGNOSIS — Z905 Acquired absence of kidney: Secondary | ICD-10-CM | POA: Insufficient documentation

## 2023-06-03 DIAGNOSIS — B1920 Unspecified viral hepatitis C without hepatic coma: Secondary | ICD-10-CM | POA: Insufficient documentation

## 2023-06-03 DIAGNOSIS — K7460 Unspecified cirrhosis of liver: Secondary | ICD-10-CM | POA: Diagnosis not present

## 2023-06-03 DIAGNOSIS — I2510 Atherosclerotic heart disease of native coronary artery without angina pectoris: Secondary | ICD-10-CM | POA: Insufficient documentation

## 2023-06-03 DIAGNOSIS — I509 Heart failure, unspecified: Secondary | ICD-10-CM | POA: Diagnosis not present

## 2023-06-03 DIAGNOSIS — Z85528 Personal history of other malignant neoplasm of kidney: Secondary | ICD-10-CM | POA: Diagnosis not present

## 2023-06-03 DIAGNOSIS — C7971 Secondary malignant neoplasm of right adrenal gland: Secondary | ICD-10-CM | POA: Insufficient documentation

## 2023-06-03 LAB — CBC
HCT: 45.3 % (ref 39.0–52.0)
Hemoglobin: 15.2 g/dL (ref 13.0–17.0)
MCH: 29.7 pg (ref 26.0–34.0)
MCHC: 33.6 g/dL (ref 30.0–36.0)
MCV: 88.5 fL (ref 80.0–100.0)
Platelets: 190 10*3/uL (ref 150–400)
RBC: 5.12 MIL/uL (ref 4.22–5.81)
RDW: 13.1 % (ref 11.5–15.5)
WBC: 7.4 10*3/uL (ref 4.0–10.5)
nRBC: 0 % (ref 0.0–0.2)

## 2023-06-03 LAB — PROTIME-INR
INR: 1 (ref 0.8–1.2)
Prothrombin Time: 13.6 s (ref 11.4–15.2)

## 2023-06-03 MED ORDER — MIDAZOLAM HCL 2 MG/2ML IJ SOLN
INTRAMUSCULAR | Status: AC | PRN
  Administered 2023-06-03: .5 mg via INTRAVENOUS
  Administered 2023-06-03: 1 mg via INTRAVENOUS
  Administered 2023-06-03 (×2): .5 mg via INTRAVENOUS

## 2023-06-03 MED ORDER — MIDAZOLAM HCL 2 MG/2ML IJ SOLN
INTRAMUSCULAR | Status: AC
  Filled 2023-06-03: qty 4

## 2023-06-03 MED ORDER — FENTANYL CITRATE (PF) 100 MCG/2ML IJ SOLN
INTRAMUSCULAR | Status: AC | PRN
  Administered 2023-06-03: 50 ug via INTRAVENOUS
  Administered 2023-06-03 (×3): 25 ug via INTRAVENOUS

## 2023-06-03 MED ORDER — LIDOCAINE 1 % OPTIME INJ - NO CHARGE
10.0000 mL | Freq: Once | INTRAMUSCULAR | Status: AC
  Administered 2023-06-03: 10 mL via INTRADERMAL
  Filled 2023-06-03: qty 10

## 2023-06-03 MED ORDER — FENTANYL CITRATE (PF) 100 MCG/2ML IJ SOLN
INTRAMUSCULAR | Status: AC
  Filled 2023-06-03: qty 2

## 2023-06-03 NOTE — Procedures (Signed)
Interventional Radiology Procedure Note  Procedure: CT guided biopsy of right adrenal mass  Complications: None immediate  Estimated Blood Loss: <25 mL  Recommendations:  - Bedrest x 1 hr - DC home - Path sent  Signed,  Sterling Big, MD

## 2023-06-03 NOTE — Discharge Instructions (Signed)
Needle Biopsy, Care After These instructions tell you how to care for yourself after your procedure. Your doctor may also give you more specific instructions. Call your doctor if you have any problems or questions. What can I expect after the procedure? After the procedure, it is common to have: Soreness. Bruising. Mild pain. Follow these instructions at home:  Return to your normal activities tomorrow. Take over-the-counter and prescription medicines only as told by your doctor. Wash your hands with soap and water before you change your bandage (dressing). If you cannot use soap and water, use hand sanitizer. You may remove your bandage in 2 days Check your puncture site every day for signs of infection. Watch for: Redness, swelling, or pain. Fluid or blood.  Pus or a bad smell. Warmth. You can shower tomorrow Keep all follow-up visits as told by your doctor. This is important. Contact a doctor if you have: A fever. Redness, swelling, or pain at the puncture site, and it lasts longer than a few days. Fluid, blood, or pus coming from the puncture site. Warmth coming from the puncture site. Get help right away if: You have a lot of bleeding from the puncture site. Summary After the procedure, it is common to have soreness, bruising, or mild pain at the puncture site. Check your puncture site every day for signs of infection, such as redness, swelling, or pain. Get help right away if you have severe bleeding from your puncture site. This information is not intended to replace advice given to you by your health care provider. Make sure you discuss any questions you have with your health care provider. Document Revised: 07/01/2017 Document Reviewed: 07/01/2017 Elsevier Patient Education  2020 Elsevier Inc.  

## 2023-06-04 ENCOUNTER — Ambulatory Visit: Payer: Medicare Other | Attending: Nurse Practitioner | Admitting: Emergency Medicine

## 2023-06-04 DIAGNOSIS — Z01810 Encounter for preprocedural cardiovascular examination: Secondary | ICD-10-CM | POA: Diagnosis not present

## 2023-06-04 LAB — SURGICAL PATHOLOGY

## 2023-06-04 NOTE — Progress Notes (Unsigned)
Cardiology Clearance has been received from Rise Paganini, NP at [facility]. The patient is at acceptable risk for surgery without further cardiovascular testing. He may hold his Aspirin for 7 days prior to surgery and restart when surgically safe. All notes are in EPIC.

## 2023-06-04 NOTE — Progress Notes (Signed)
Virtual Visit via Telephone Note   Because of Aaron Cole's co-morbid illnesses, he is at least at moderate risk for complications without adequate follow up.  This format is felt to be most appropriate for this patient at this time.  The patient did not have access to video technology/had technical difficulties with video requiring transitioning to audio format only (telephone).  All issues noted in this document were discussed and addressed.  No physical exam could be performed with this format.  Please refer to the patient's chart for his consent to telehealth for [facility].  Evaluation Performed:  Preoperative cardiovascular risk assessment _____________   Date:  06/04/2023   Patient ID:  Aaron Cole, DOB 03-Apr-1951, MRN [id number] Patient Location:  Home Provider location:   Office  Primary Care Provider:  Eustaquio Boyden, MD Primary Cardiologist:  Lesleigh Noe, MD (Inactive)  Chief Complaint / Patient Profile   72 y.o. y/o male with a h/o aortic valve replacement and repair of ascending aortic aneurysm, nephrectomy for renal cell carcinoma, HTN,  PVC, and HFrEF who is pending robotic adrenalectomy on 06/13/2023 with [facility] and presents today for telephonic preoperative cardiovascular risk assessment.  History of Present Illness    Aaron Cole is a 72 y.o. male who presents via audio/video conferencing for a telehealth visit today.  Pt was last seen in cardiology clinic on 03/14/2023 by Sherie Don, NP.  At that time Aaron Cole was doing well.  The patient is now pending procedure as outlined above. Since his last visit, he denies chest pain, shortness of breath, lower extremity edema, fatigue, palpitations, melena, hematuria, hemoptysis, diaphoresis, weakness, presyncope, syncope, orthopnea, and PND.  Past Medical History    Past Medical History:  Diagnosis Date   CAD (coronary artery disease) 2013   per CT scan  -  brings CD of CT, no report - will need [facility] records of CT scan report   CHF (congestive heart failure) (HCC)    Cirrhosis of liver (HCC) 01/10/2019   By [place] 03/2020   CKD (chronic kidney disease) stage 3, GFR 30-59 ml/min (HCC) 2015   iatrogenic (decreased nephron mass) established with Dr. Arvin Collard [facility])   Diverticulosis    by CT scan   Hepatitis C virus infection without hepatic coma 03/02/2019   History of hepatitis B virus infection    spontaneously cleared   HTN (hypertension)    Renal cell carcinoma (HCC) 2013   clear cell s/p R nephrectomy   S/p nephrectomy 2013   [facility]   Sinus headache    recurrent   Past Surgical History:  Procedure Laterality Date   BENTALL PROCEDURE N/A 07/05/2015   Procedure: BENTALL PROCEDURE UTILIZING A 28 X 10 MM WOVEN DOUBLE VELOUR VASCULAR GRAFT AND A GELWEAVE WOVEN VASCULAR GRAFT. 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.;  Surgeon: Alleen Borne, MD;  Location: [facility] OR;  Service: Open Heart Surgery;  Laterality: N/A;   CARDIAC VALVE REPLACEMENT     COLONOSCOPY  11/2008   WNL records in chart [facility])   COLONOSCOPY WITH PROPOFOL N/A 02/24/2019   TA, HP, rpt 5 yrs ([facility])   CORONARY ANGIOPLASTY     ESOPHAGOGASTRODUODENOSCOPY (EGD) WITH PROPOFOL N/A 08/04/2020   Procedure: ESOPHAGOGASTRODUODENOSCOPY (EGD) WITH PROPOFOL;  Surgeon: Wyline Mood, MD;  Location: [facility] ENDOSCOPY;  Service: Gastroenterology;  Laterality: N/A;   HYDROCELE EXCISION Right 02/05/2020   Procedure: HYDROCELECTOMY ADULT;  Surgeon: Sondra Come,  MD;  Location: [facility] ORS;  Service: Urology;  Laterality: Right;   NASAL SINUS SURGERY  1978   [facility] done at [facility]   NEPHRECTOMY Right 12/2011   [facility], R for RCC   rectal fistula repair  1980s   TEE WITHOUT CARDIOVERSION N/A 07/05/2015   Procedure: TRANSESOPHAGEAL ECHOCARDIOGRAM (TEE);  Surgeon: Alleen Borne, MD;  Location: [facility] OR;  Service: Open Heart Surgery;  Laterality: N/A;     Allergies  Allergies  Allergen Reactions   Carvedilol Other (See Comments)    Blurred vision, drowsiness   Metoprolol Diarrhea    Home Medications    Prior to Admission medications   Medication Sig Start Date End Date Taking? Authorizing Provider  aspirin EC 81 MG tablet Take 81 mg by mouth once.    [provider]  fluticasone (FLONASE) 50 MCG/ACT nasal spray INHALE 2 SPRAYS INTO EACH NOSTRIL EVERY DAY. Patient taking differently: Place 2 sprays into both nostrils daily as needed for allergies. 06/27/16   Eustaquio Boyden, MD  sacubitril-valsartan (ENTRESTO) 49-51 MG Take 1 tablet by mouth 2 (two) times daily. 08/29/22   Lanier Prude, MD    Physical Exam    Vital Signs:  Aaron Cole does not have vital signs available for review today.  Given telephonic nature of communication, physical exam is limited. AAOx3. NAD. Normal affect.  Speech and respirations are unlabored.  Accessory Clinical Findings    None  Assessment & Plan    1.  Preoperative Cardiovascular Risk Assessment: According to the Revised Cardiac Risk Index (RCRI), his Perioperative Risk of Major Cardiac Event is (%): 6.6. His Functional Capacity in METs is: 9.25 according to the Duke Activity Status Index (DASI). Therefore, based on ACC/AHA guidelines, patient would be at acceptable risk for the planned procedure without further cardiovascular testing. I will route this recommendation to the requesting party via Epic fax function.  The patient was advised that if he develops new symptoms prior to surgery to contact our office to arrange for a follow-up visit, and he verbalized understanding.  Patient can hold ASA 81 mg 7 days prior to procedure and should restart postprocedure when surgically safe and guided by surgery team.   A copy of this note will be routed to requesting surgeon.  Time:   Today, I have spent 7 minutes with the patient with telehealth technology discussing medical  history, symptoms, and management plan.     Denyce Robert, NP  06/04/2023,

## 2023-06-05 ENCOUNTER — Ambulatory Visit: Payer: Medicare Other | Attending: Cardiology

## 2023-06-05 ENCOUNTER — Other Ambulatory Visit: Payer: Self-pay | Admitting: Cardiology

## 2023-06-05 DIAGNOSIS — I493 Ventricular premature depolarization: Secondary | ICD-10-CM | POA: Insufficient documentation

## 2023-06-05 DIAGNOSIS — I5042 Chronic combined systolic (congestive) and diastolic (congestive) heart failure: Secondary | ICD-10-CM

## 2023-06-05 LAB — ECHOCARDIOGRAM COMPLETE
AV Mean grad: 6 mm[Hg]
AV Peak grad: 10.6 mm[Hg]
Ao pk vel: 1.63 m/s
Area-P 1/2: 3.08 cm2
S' Lateral: 4.8 cm

## 2023-06-06 ENCOUNTER — Telehealth: Payer: Self-pay | Admitting: Surgery

## 2023-06-06 ENCOUNTER — Inpatient Hospital Stay: Admission: RE | Admit: 2023-06-06 | Payer: Medicare Other | Source: Ambulatory Visit

## 2023-06-06 DIAGNOSIS — C641 Malignant neoplasm of right kidney, except renal pelvis: Secondary | ICD-10-CM

## 2023-06-06 NOTE — Telephone Encounter (Signed)
Aaron Cole recently underwent biopsy and we did have the results and I have called the patient with the results.  I have also consulted with Dr. Apolinar Junes and Dr. Richardo Hanks.  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  [facility] has done the initial right radical nephrectomy in the past ( Dr Joseph Art) and he was seen last by Dr. Gean Quint. We will place referral to [facility] urology so that they can do a radical resection of the metastatic renal cell carcinoma. I updated him about the situation and he understands, he is very Adult nurse.

## 2023-06-06 NOTE — Telephone Encounter (Signed)
Per Dr. Everlene Farrier due to complexity of this case surgery for 06/13/23 is cancelled.  Patient will be referred to [facility].

## 2023-06-10 DIAGNOSIS — E278 Other specified disorders of adrenal gland: Secondary | ICD-10-CM | POA: Diagnosis not present

## 2023-06-10 DIAGNOSIS — R933 Abnormal findings on diagnostic imaging of other parts of digestive tract: Secondary | ICD-10-CM | POA: Diagnosis not present

## 2023-06-12 ENCOUNTER — Ambulatory Visit: Payer: Medicare Other | Attending: Medical | Admitting: Medical

## 2023-06-12 ENCOUNTER — Encounter: Payer: Self-pay | Admitting: Medical

## 2023-06-12 VITALS — BP 140/77 | HR 73 | Ht 71.0 in | Wt 229.4 lb

## 2023-06-12 DIAGNOSIS — I493 Ventricular premature depolarization: Secondary | ICD-10-CM | POA: Diagnosis not present

## 2023-06-12 DIAGNOSIS — Z952 Presence of prosthetic heart valve: Secondary | ICD-10-CM

## 2023-06-12 DIAGNOSIS — Z01810 Encounter for preprocedural cardiovascular examination: Secondary | ICD-10-CM | POA: Diagnosis not present

## 2023-06-12 DIAGNOSIS — I5042 Chronic combined systolic (congestive) and diastolic (congestive) heart failure: Secondary | ICD-10-CM

## 2023-06-12 NOTE — Progress Notes (Signed)
Cardiology Office Note:    Date:  06/12/2023   ID:  Aaron Cole, DOB December 01, 1950, MRN [id number]  PCP:  Eustaquio Boyden, MD  [facility] Cardiologist:  Lesleigh Noe, MD (Inactive)  [facility] Electrophysiologist:  Lanier Prude, MD   Referring MD: Eustaquio Boyden, MD   Chief Complaint: echo follow-up.   History of Present Illness:    Aaron Cole is a 72 y.o. male with a hx of PVC, HFrEF, s/p AVR with Bentall procedure January 2017, h/o nephrectomy for renal cell carcinoma who is being seen fo pre-op.  The patient has h/o PVCs followed by EP. EF as low as 40-45% on echo in 2024.  He did not tolerate metoprolol. Reported improvement in PVC burden with Entresto. Not a good candidate for many AA due to structural heart disease, no amio d/t cirrhosis.  Repeat echo in December 2024 showed EF 45 to 50%. He was referred to general cards for GDMT titration of heart failure meds.   Today, the patient reports he is seeing surgery tomorrow. He is needing radical resection of the metastatic renal cell carcinoma. He has stopped Asprin. He denies chest pain, SOB, lower leg edema, lightheadedness, dizziness. He feels better on the [facility]. He can walk 1-2 city blocks, up a flight of stairs, moderate house work. No h/o stroke and no diabetes.   Past Medical History:  Diagnosis Date   CAD (coronary artery disease) 2013   per CT scan  - brings CD of CT, no report - will need [facility] records of CT scan report   CHF (congestive heart failure) (HCC)    Cirrhosis of liver (HCC) 01/10/2019   By [place] 03/2020   CKD (chronic kidney disease) stage 3, GFR 30-59 ml/min (HCC) 2015   iatrogenic (decreased nephron mass) established with Dr. Arvin Collard [facility])   Diverticulosis    by CT scan   Hepatitis C virus infection without hepatic coma 03/02/2019   History of hepatitis B virus infection    spontaneously cleared   HTN (hypertension)    Renal cell carcinoma (HCC) 2013   clear cell s/p R  nephrectomy   S/p nephrectomy 2013   [facility]   Sinus headache    recurrent    Past Surgical History:  Procedure Laterality Date   BENTALL PROCEDURE N/A 07/05/2015   Procedure: BENTALL PROCEDURE UTILIZING A 28 X 10 MM WOVEN DOUBLE VELOUR VASCULAR GRAFT AND A GELWEAVE WOVEN VASCULAR GRAFT. 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.;  Surgeon: Alleen Borne, MD;  Location: [facility] OR;  Service: Open Heart Surgery;  Laterality: N/A;   CARDIAC VALVE REPLACEMENT     COLONOSCOPY  11/2008   WNL records in chart [facility])   COLONOSCOPY WITH PROPOFOL N/A 02/24/2019   TA, HP, rpt 5 yrs ([place])   CORONARY ANGIOPLASTY     ESOPHAGOGASTRODUODENOSCOPY (EGD) WITH PROPOFOL N/A 08/04/2020   Procedure: ESOPHAGOGASTRODUODENOSCOPY (EGD) WITH PROPOFOL;  Surgeon: Wyline Mood, MD;  Location: [facility] ENDOSCOPY;  Service: Gastroenterology;  Laterality: N/A;   HYDROCELE EXCISION Right 02/05/2020   Procedure: HYDROCELECTOMY ADULT;  Surgeon: Sondra Come, MD;  Location: [facility] ORS;  Service: Urology;  Laterality: Right;   NASAL SINUS SURGERY  1978   sin[facility] surgery done [facility]   NEPHRECTOMY Right 12/2011   [facility], R for RCC   rectal fistula repair  1980s   TEE WITHOUT CARDIOVERSION N/A 07/05/2015   Procedure: TRANSESOPHAGEAL ECHOCARDIOGRAM (TEE);  Surgeon: Alleen Borne, MD;  Location: [facility] OR;  Service: Open Heart Surgery;  Laterality: N/A;    Current Medications: Current Meds  Medication Sig   aspirin EC 81 MG tablet Take 81 mg by mouth once.   fluticasone (FLONASE) 50 MCG/ACT nasal spray INHALE 2 SPRAYS INTO EACH NOSTRIL EVERY DAY. (Patient taking differently: Place 2 sprays into both nostrils daily as needed for allergies.)   sacubitril-valsartan (ENTRESTO) 49-51 MG Take 1 tablet by mouth 2 (two) times daily.     Allergies:   Carvedilol and Metoprolol   Social History   Socioeconomic History   Marital status: Single    Spouse name: Not on file   Number of children: Not on  file   Years of education: Not on file   Highest education level: Not on file  Occupational History   Not on file  Tobacco Use   Smoking status: Never    Passive exposure: Never   Smokeless tobacco: Never  Vaping Use   Vaping status: Never Used  Substance and Sexual Activity   Alcohol use: Not Currently   Drug use: Never   Sexual activity: Not Currently    Birth control/protection: Abstinence  Other Topics Concern   Not on file  Social History Narrative   Caffeine: occasional   Lives alone, 1 cat   Occupation: farming business   Edu: BS   Activity: bikes 3-4x/wk about 1 hour   Diet: good water, fruits/vegetables daily, steak 1x/wk, fish 2x/wk   Social Determinants of Health   Financial Resource Strain: Low Risk  (06/12/2023)   Received [facility]   Overall Financial Resource Strain (CARDIA)    Difficulty of Paying Living Expenses: Not hard at all  Food Insecurity: No Food Insecurity (06/12/2023)   Received from [facility]   Hunger Vital Sign    Worried About Running Out of Food in the Last Year: Never true    Ran Out of Food in the Last Year: Never true  Transportation Needs: No Transportation Needs (06/12/2023)   Received from [facility]   PRAPARE - Transportation    Lack of Transportation (Medical): No    Lack of Transportation (Non-Medical): No  Physical Activity: Insufficiently Active (01/22/2020)   Exercise Vital Sign    Days of Exercise per Week: 4 days    Minutes of Exercise per Session: 20 min  Stress: No Stress Concern Present (01/22/2020)   [employer] of Occupational Health - Occupational Stress Questionnaire    Feeling of Stress : Not at all  Social Connections: Not on file     Family History: The patient's family history includes CAD in his maternal grandfather; Cancer in his paternal aunt, paternal aunt, and paternal grandmother; Colon polyps in his brother and father; Coronary artery disease in his maternal uncle; Crohn's  disease in his father; Diabetes in his father; Heart disease in his father and mother; Hypertension in his mother; Obesity in his mother. There is no history of Stroke.  ROS:   Please see the history of present illness.     All other systems reviewed and are negative.  EKGs/Labs/Other Studies Reviewed:    The following studies were reviewed today:  Echo 06/2023 1. Left ventricular ejection fraction, by estimation, is 45 to 50%. Left  ventricular ejection fraction by PLAX is 47 %. The left ventricle has  mildly decreased function. The left ventricle demonstrates global  hypokinesis. Left ventricular diastolic  parameters are consistent with Grade I diastolic dysfunction (impaired  relaxation). The average  left ventricular global longitudinal strain is  -12.4 %.   2. Right ventricular systolic function is normal. The right ventricular  size is normal. Tricuspid regurgitation signal is inadequate for assessing  PA pressure.   3. The mitral valve is normal in structure. No evidence of mitral valve  regurgitation. No evidence of mitral stenosis.   4. The aortic valve has been repaired/replaced. Aortic valve  regurgitation is not visualized. No aortic stenosis is present. Aortic  valve mean gradient measures 6.0 mmHg.   5. The inferior vena cava is normal in size with greater than 50%  respiratory variability, suggesting right atrial pressure of 3 mmHg.   Heart monitor 01/2023 HR 50 - 102 bpm, average 71 bpm. Rare supraventricular ectopy. Occasional ventricular ectopy, 3.3%. No sustained arrhythmias. No atrial fibrillation.   Sheria Lang T. Lalla Brothers, MD, [facility], [facility] Cardiac Electrophysiology  Echo 07/2022  1. Left ventricular ejection fraction, by estimation, is 40 to 45%. The  left ventricle has mildly decreased function. The left ventricle has no  regional wall motion abnormalities. Left ventricular diastolic parameters  are consistent with Grade II  diastolic dysfunction  (pseudonormalization).   2. Right ventricular systolic function is normal. The right ventricular  size is normal.   3. Left atrial size was moderately dilated.   4. The mitral valve is normal in structure. Mild mitral valve  regurgitation. No evidence of mitral stenosis.   5. The aortic valve is normal in structure. Aortic valve regurgitation is  not visualized. Aortic valve sclerosis is present, with no evidence of  aortic valve stenosis. There is a 25 mm Magna pericardial valve present in  the aortic position.   6. There is borderline dilatation of the aortic root, measuring 38 mm.   7. The inferior vena cava is normal in size with greater than 50%  respiratory variability, suggesting right atrial pressure of 3 mmHg.    Heart monitor 04/2022   Basic rhythm is NSR with BBB/IVCD with average HR 75 bpm.   PVC burden 24% with episodes of ventricular bigeminy and trigeminy   One 4 beat nonsustained VT     Patch Wear Time:  2 days and 22 hours (2023-10-10T23:38:18-0400 to 2023-10-13T22:32:08-398)   Patient had a min HR of 53 bpm, max HR of 133 bpm, and avg HR of 75 bpm. Predominant underlying rhythm was Sinus Rhythm, Bundle Branch Block/IVCD was present, 1 run of Ventricular Tachycardia occurred lasting 4 beats with a max rate of 133 bpm (avg 125  bpm). Isolated SVEs were rare (<1.0%), SVE Couplets were rare (<1.0%), and SVE Triplets were rare (<1.0%). Isolated VEs were frequent (23.6%, [id number]), VE Couplets were rare (<1.0%, 396), and VE Triplets were rare (<1.0%, 37). Ventricular Bigeminy and  Trigeminy were present.      EKG:  EKG is ordered today.  The ekg ordered today demonstrates NSR 72bpm, PVCs  Recent Labs: 04/01/2023: ALT 11; BUN 16; Creatinine, Ser 1.29; Potassium 4.1; Sodium 140 06/03/2023: Hemoglobin 15.2; Platelets 190  Recent Lipid Panel    Component Value Date/Time   CHOL 184 04/01/2023 0732   TRIG 174.0 (H) 04/01/2023 0732   TRIG 131 06/21/2011 0000   HDL 31.10 (L)  04/01/2023 9604   CHOLHDL 6 04/01/2023 0732   VLDL 34.8 04/01/2023 0732   LDLCALC 118 (H) 04/01/2023 0732   LDLDIRECT 118 06/21/2011 0000     Physical Exam:    VS:  BP (!) 140/77 (BP Location: Left Arm, Patient Position: Sitting, Cuff Size: Normal)   Pulse  73   Ht 5\' 11"  (1.803 m)   Wt 229 lb 6.4 oz (104.1 kg)   SpO2 99%   BMI 31.99 kg/m     Wt Readings from Last 3 Encounters:  06/12/23 229 lb 6.4 oz (104.1 kg)  06/03/23 229 lb (103.9 kg)  05/20/23 225 lb (102.1 kg)     GEN:  Well nourished, well developed in no acute distress HEENT: Normal NECK: No JVD; No carotid bruits LYMPHATICS: No lymphadenopathy CARDIAC: RRR, no murmurs, rubs, gallops RESPIRATORY:  Clear to auscultation without rales, wheezing or rhonchi  ABDOMEN: Soft, non-tender, non-distended MUSCULOSKELETAL:  No edema; No deformity  SKIN: Warm and dry NEUROLOGIC:  Alert and oriented x 3 PSYCHIATRIC:  Normal affect   ASSESSMENT:    1. PVC's (premature ventricular contractions)   2. Pre-operative cardiovascular examination   3. Chronic combined systolic and diastolic heart failure (HCC)   4. S/P aortic valve replacement    PLAN:    In order of problems listed above:  Pre-operative cardiac evaluation HFrEF S/p AVR PVCs H/o renal cell carcinoma The patient is to see surgeon tomorrow to discuss radical resection of the metastatic renal cell carcinoma. This is a new development since echocardiogram. He may need surgery next week. He has stopped ASA in preparation for this. He has a h/o PVCs with mildly reduced EF. He did not tolerate Coreg or metoprolol. He was started on Entresto with improvement of PVC burden. Echo showed LVEF 45-50%. The patient is overall stable from a cardiac perspective. He denies chest pain, SOB, LLE, orthopnea or pnd. EKG today is stable with PVCs. METS>4. According to the RCRI he is 10.1% risk of major cardiac events. No further cardiac work-up prior to surgery. We will see him back  after the surgery to consider GDMT for CHF.   Disposition: Follow up in 2 week(s) with MD/APP     Signed, Shauniece Kwan David Stall, PA-C  06/12/2023 3:58 PM    [facility]

## 2023-06-12 NOTE — Patient Instructions (Signed)
Medication Instructions:  - No changes *If you need a refill on your cardiac medications before your next appointment, please call your pharmacy*  Lab Work: - None ordered  Testing/Procedures: - None ordered  Follow-Up: At [facility], you and your health needs are our priority.  As part of our continuing mission to provide you with exceptional heart care, we have created designated Provider Care Teams.  These Care Teams include your primary Cardiologist (physician) and Advanced Practice Providers (APPs -  Physician Assistants and Nurse Practitioners) who all work together to provide you with the care you need, when you need it.  Your next appointment:   2 month(s)  Provider:   Terrilee Croak, PA-C

## 2023-06-13 ENCOUNTER — Inpatient Hospital Stay: Admit: 2023-06-13 | Payer: Medicare Other | Admitting: Surgery

## 2023-06-13 DIAGNOSIS — C641 Malignant neoplasm of right kidney, except renal pelvis: Secondary | ICD-10-CM | POA: Diagnosis not present

## 2023-06-13 DIAGNOSIS — E278 Other specified disorders of adrenal gland: Secondary | ICD-10-CM | POA: Diagnosis not present

## 2023-06-13 DIAGNOSIS — C61 Malignant neoplasm of prostate: Secondary | ICD-10-CM | POA: Diagnosis not present

## 2023-06-13 DIAGNOSIS — Z85528 Personal history of other malignant neoplasm of kidney: Secondary | ICD-10-CM | POA: Diagnosis not present

## 2023-06-13 SURGERY — ADRENALECTOMY, ROBOT-ASSISTED
Anesthesia: General | Laterality: Right

## 2023-06-20 ENCOUNTER — Ambulatory Visit: Payer: Medicare Other | Admitting: Oncology

## 2023-07-04 DIAGNOSIS — R911 Solitary pulmonary nodule: Secondary | ICD-10-CM | POA: Diagnosis not present

## 2023-07-04 DIAGNOSIS — C7901 Secondary malignant neoplasm of right kidney and renal pelvis: Secondary | ICD-10-CM | POA: Diagnosis not present

## 2023-07-04 DIAGNOSIS — C641 Malignant neoplasm of right kidney, except renal pelvis: Secondary | ICD-10-CM | POA: Diagnosis not present

## 2023-07-04 DIAGNOSIS — C7971 Secondary malignant neoplasm of right adrenal gland: Secondary | ICD-10-CM | POA: Diagnosis not present

## 2023-07-04 DIAGNOSIS — R918 Other nonspecific abnormal finding of lung field: Secondary | ICD-10-CM | POA: Diagnosis not present

## 2023-07-10 ENCOUNTER — Telehealth: Payer: Self-pay

## 2023-07-10 NOTE — Telephone Encounter (Signed)
   Pre-operative Risk Assessment    Patient Name: Aaron Cole  DOB: 16-Oct-1950 MRN: [id number]   Date of last office visit: 06/12/23 Cadence Furth PA Date of next office visit: 08/21/23 Cadence Furth PA   Request for Surgical Clearance    Procedure:   adrenalectomy  Date of Surgery:  Clearance 07/25/23                                Surgeon:  Dr. Levander Cheadle Surgeon's Group or Practice Name:  [facility] Phone number:  [phone] Fax number:  [phone]   Type of Clearance Requested:   - Medical  - Pharmacy:  Hold ASA 81 mg      Type of Anesthesia:  Not Indicated   Additional requests/questions:    Bonney Huxley Evelyn Aguinaldo   07/10/2023, 5:30 PM

## 2023-07-11 NOTE — Telephone Encounter (Signed)
   Name: Aaron Cole  DOB: December 17, 1950  MRN: [id number]   Primary Cardiologist: Victory LELON Claudene DOUGLAS, MD (Inactive)  Chart reviewed as part of pre-operative protocol coverage. [name] was last seen on 06/12/2023 by Cadence Furth, PA-C.  Per office visit note The patient is overall stable from a cardiac perspective. He denies chest pain, SOB, LLE, orthopnea or pnd. EKG today is stable with PVCs. METS>4. According to the RCRI he is 10.1% risk of major cardiac events. No further cardiac work-up prior to surgery.  Therefore, based on ACC/AHA guidelines, the patient would be an acceptable risk for the planned procedure without further cardiovascular testing.   Per office protocol, he may hold aspirin  for 7 days prior to procedure and should resume as soon as hemodynamically stable postoperatively.   I will route this recommendation to the requesting party via Epic fax function and remove from pre-op pool. Please call with questions.  Barnie Hila, NP 07/11/2023, 12:49 PM

## 2023-07-16 DIAGNOSIS — Z905 Acquired absence of kidney: Secondary | ICD-10-CM | POA: Diagnosis not present

## 2023-07-16 DIAGNOSIS — I130 Hypertensive heart and chronic kidney disease with heart failure and stage 1 through stage 4 chronic kidney disease, or unspecified chronic kidney disease: Secondary | ICD-10-CM | POA: Diagnosis not present

## 2023-07-16 DIAGNOSIS — C641 Malignant neoplasm of right kidney, except renal pelvis: Secondary | ICD-10-CM | POA: Diagnosis not present

## 2023-07-16 DIAGNOSIS — R82998 Other abnormal findings in urine: Secondary | ICD-10-CM | POA: Diagnosis not present

## 2023-07-16 DIAGNOSIS — K7460 Unspecified cirrhosis of liver: Secondary | ICD-10-CM | POA: Diagnosis not present

## 2023-07-16 DIAGNOSIS — Z419 Encounter for procedure for purposes other than remedying health state, unspecified: Secondary | ICD-10-CM | POA: Diagnosis not present

## 2023-07-16 DIAGNOSIS — Z7982 Long term (current) use of aspirin: Secondary | ICD-10-CM | POA: Diagnosis not present

## 2023-07-16 DIAGNOSIS — Z79899 Other long term (current) drug therapy: Secondary | ICD-10-CM | POA: Diagnosis not present

## 2023-07-16 DIAGNOSIS — Z952 Presence of prosthetic heart valve: Secondary | ICD-10-CM | POA: Diagnosis not present

## 2023-07-16 DIAGNOSIS — I509 Heart failure, unspecified: Secondary | ICD-10-CM | POA: Diagnosis not present

## 2023-07-16 DIAGNOSIS — N183 Chronic kidney disease, stage 3 unspecified: Secondary | ICD-10-CM | POA: Diagnosis not present

## 2023-07-16 DIAGNOSIS — N182 Chronic kidney disease, stage 2 (mild): Secondary | ICD-10-CM | POA: Diagnosis not present

## 2023-07-16 DIAGNOSIS — Z01818 Encounter for other preprocedural examination: Secondary | ICD-10-CM | POA: Diagnosis not present

## 2023-07-25 DIAGNOSIS — Z6830 Body mass index (BMI) 30.0-30.9, adult: Secondary | ICD-10-CM | POA: Diagnosis not present

## 2023-07-25 DIAGNOSIS — C641 Malignant neoplasm of right kidney, except renal pelvis: Secondary | ICD-10-CM | POA: Diagnosis not present

## 2023-07-25 DIAGNOSIS — Z85528 Personal history of other malignant neoplasm of kidney: Secondary | ICD-10-CM | POA: Diagnosis not present

## 2023-07-25 DIAGNOSIS — C7971 Secondary malignant neoplasm of right adrenal gland: Secondary | ICD-10-CM | POA: Diagnosis not present

## 2023-07-25 DIAGNOSIS — C649 Malignant neoplasm of unspecified kidney, except renal pelvis: Secondary | ICD-10-CM | POA: Diagnosis not present

## 2023-07-25 DIAGNOSIS — I5022 Chronic systolic (congestive) heart failure: Secondary | ICD-10-CM | POA: Diagnosis not present

## 2023-07-25 DIAGNOSIS — E669 Obesity, unspecified: Secondary | ICD-10-CM | POA: Diagnosis not present

## 2023-07-25 DIAGNOSIS — E278 Other specified disorders of adrenal gland: Secondary | ICD-10-CM | POA: Diagnosis not present

## 2023-07-25 DIAGNOSIS — C7970 Secondary malignant neoplasm of unspecified adrenal gland: Secondary | ICD-10-CM | POA: Diagnosis not present

## 2023-07-25 DIAGNOSIS — I130 Hypertensive heart and chronic kidney disease with heart failure and stage 1 through stage 4 chronic kidney disease, or unspecified chronic kidney disease: Secondary | ICD-10-CM | POA: Diagnosis not present

## 2023-07-29 ENCOUNTER — Telehealth: Payer: Self-pay

## 2023-07-29 NOTE — Transitions of Care (Post Inpatient/ED Visit) (Signed)
07/29/2023  Name: Aaron Cole MRN: [id number] DOB: 06-09-1951  Today's TOC FU Call Status: Today's TOC FU Call Status:: Successful TOC FU Call Completed TOC FU Call Complete Date: 07/29/23 Patient's Name and Date of Birth confirmed.  Transition Care Management Follow-up Telephone Call Date of Discharge: 07/26/23 Discharge Facility: Other ([facility] Facility) Name of Other ([facility]) Discharge Facility: [facility] Type of Discharge: Inpatient Admission Primary Inpatient Discharge Diagnosis:: Adrenal Mass How have you been since you were released from the hospital?: Better Any questions or concerns?: No  Items Reviewed: Did you receive and understand the discharge instructions provided?: Yes Medications obtained,verified, and reconciled?: Yes (Medications Reviewed) Any new allergies since your discharge?: No Dietary orders reviewed?: Yes Type of Diet Ordered:: Regular Do you have support at home?: Yes People in Home: alone Name of Support/Comfort Primary Source: June Reece  Medications Reviewed Today: Medications Reviewed Today     Reviewed by Redge Gainer, RN (Case Manager) on 07/29/23 at [phone]  Med List Status: <None>   Medication Order Taking? Sig Documenting Provider Last Dose Status Informant  aspirin EC 81 MG tablet [id number]  Take 81 mg by mouth once. [provider]  Active Self  docusate sodium (COLACE) 100 MG capsule [id number] Yes Take 100 mg by mouth 2 (two) times daily. [provider]  Active   fluticasone (FLONASE) 50 MCG/ACT nasal spray [id number]  INHALE 2 SPRAYS INTO EACH NOSTRIL EVERY DAY.  Patient taking differently: Place 2 sprays into both nostrils daily as needed for allergies.   Eustaquio Boyden, MD  Active Self  oxycodone (OXY-IR) 5 MG capsule [id number] Yes Take 5 mg by mouth every 4 (four) hours as needed. [provider]  Active   sacubitril-valsartan (ENTRESTO) 49-51 MG [id number]  Take 1 tablet by mouth 2  (two) times daily. Lanier Prude, MD  Active Self  senna (SENOKOT) 8.6 MG TABS tablet [id number] Yes Take 2 tablets by mouth daily as needed for mild constipation. constipation [provider]  Active   tamsulosin (FLOMAX) 0.4 MG CAPS capsule [id number] Yes Take 0.4 mg by mouth. [provider]  Active             Home Care and Equipment/Supplies: Were Home Health Services Ordered?: NA Any new equipment or medical supplies ordered?: NA  Functional Questionnaire: Do you need assistance with bathing/showering or dressing?: No Do you need assistance with meal preparation?: No Do you need assistance with eating?: No Do you have difficulty maintaining continence: No Do you need assistance with getting out of bed/getting out of a chair/moving?: No Do you have difficulty managing or taking your medications?: No  Follow up appointments reviewed: PCP Follow-up appointment confirmed?: NA Specialist Hospital Follow-up appointment confirmed?: Yes Date of Specialist follow-up appointment?: 08/22/23 Follow-Up Specialty Provider:: [facility] Do you need transportation to your follow-up appointment?: No Do you understand care options if your condition(s) worsen?: Yes-patient verbalized understanding  SDOH Interventions Today    Flowsheet Row Most Recent Value  SDOH Interventions   Food Insecurity Interventions Intervention Not Indicated  Housing Interventions Intervention Not Indicated  Transportation Interventions Intervention Not Indicated  Utilities Interventions Intervention Not Indicated  Social Connections Interventions Intervention Not Indicated      Interventions Today    Flowsheet Row Most Recent Value  Chronic Disease   Chronic disease during today's visit Other  [Cancer]  General Interventions   General Interventions Discussed/Reviewed General Interventions Discussed, Doctor Visits  Doctor Visits Discussed/Reviewed Doctor Visits Discussed  Exercise  Interventions   Exercise Discussed/Reviewed Exercise Discussed  Education Interventions   Education Provided Provided Education  Provided Verbal Education On Medication, When to see the doctor  Pharmacy Interventions   Pharmacy Dicussed/Reviewed Medications and their functions       TOC Outreach completed to the patient today. He is back to work and states he isn't in that much pain. Just some soreness from the incision site. He is taking Aspirin for pain. He does not want to take his narcotic medication and he is away and staying on top of constipation. He declines the 30 day program  Deidre Ala, BSN, RN [place]  [facility] - [facility] RN Care Manager [phone]

## 2023-08-21 ENCOUNTER — Ambulatory Visit: Payer: Medicare Other | Attending: Medical | Admitting: Medical

## 2023-08-21 ENCOUNTER — Encounter: Payer: Self-pay | Admitting: Medical

## 2023-08-21 ENCOUNTER — Ambulatory Visit: Payer: Medicare Other | Admitting: Medical

## 2023-08-21 VITALS — BP 122/77 | HR 97 | Ht 71.0 in | Wt 229.4 lb

## 2023-08-21 DIAGNOSIS — G479 Sleep disorder, unspecified: Secondary | ICD-10-CM | POA: Diagnosis not present

## 2023-08-21 DIAGNOSIS — I5042 Chronic combined systolic (congestive) and diastolic (congestive) heart failure: Secondary | ICD-10-CM | POA: Insufficient documentation

## 2023-08-21 DIAGNOSIS — I493 Ventricular premature depolarization: Secondary | ICD-10-CM | POA: Insufficient documentation

## 2023-08-21 DIAGNOSIS — Z79899 Other long term (current) drug therapy: Secondary | ICD-10-CM | POA: Diagnosis not present

## 2023-08-21 DIAGNOSIS — I480 Paroxysmal atrial fibrillation: Secondary | ICD-10-CM | POA: Diagnosis not present

## 2023-08-21 MED ORDER — APIXABAN 5 MG PO TABS: 5.0000 mg | ORAL_TABLET | Freq: Two times a day (BID) | ORAL | 1 refills | Status: DC

## 2023-08-21 MED ORDER — SACUBITRIL-VALSARTAN 24-26 MG PO TABS: 1.0000 | ORAL_TABLET | Freq: Two times a day (BID) | ORAL | 3 refills | Status: AC

## 2023-08-21 MED ORDER — METOPROLOL SUCCINATE ER 25 MG PO TB24: 25.0000 mg | ORAL_TABLET | Freq: Every day | ORAL | 1 refills | Status: DC

## 2023-08-21 NOTE — Patient Instructions (Addendum)
 Medication Instructions:  Your physician recommends the following medication changes.  STOP TAKING: Aspirin 81 MG daily  START TAKING: Eliquis 5 MG twice daily Metoprolol Succinate 25 MG daily  DECREASE: Decrease Entresto to 24-26 MG twice daily.   *If you need a refill on your cardiac medications before your next appointment, please call your pharmacy*   Lab Work: Your provider would like for you to have following labs drawn today TSH and MAG.   If you have labs (blood work) drawn today and your tests are completely normal, you will receive your results only by: MyChart Message (if you have MyChart) OR A paper copy in the mail If you have any lab test that is abnormal or we need to change your treatment, we will call you to review the results.   Testing/Procedures: None ordered today.   Follow-Up: At [facility], you and your health needs are our priority.  As part of our continuing mission to provide you with exceptional heart care, we have created designated Provider Care Teams.  These Care Teams include your primary Cardiologist (physician) and Advanced Practice Providers (APPs -  Physician Assistants and Nurse Practitioners) who all work together to provide you with the care you need, when you need it.  We recommend signing up for the patient portal called "MyChart".  Sign up information is provided on this After Visit Summary.  MyChart is used to connect with patients for Virtual Visits (Telemedicine).  Patients are able to view lab/test results, encounter notes, upcoming appointments, etc.  Non-urgent messages can be sent to your provider as well.   To learn more about what you can do with MyChart, go to [email or web address].    Your next appointment:   3 week(s)  Provider:   Terrilee Croak, PA-C

## 2023-08-21 NOTE — Progress Notes (Signed)
 Cardiology Office Note:  .   Date:  08/21/2023  ID:  Aaron Cole, DOB 12/10/1950, MRN [id number] PCP: Eustaquio Boyden, MD  [facility] Providers Cardiologist:  Lesleigh Noe, MD (Inactive) Electrophysiologist:  Lanier Prude, MD {  History of Present Illness: .   Aaron Cole is a 73 y.o. 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 EF as low as 40-45% on echo in 2024.  He did not tolerate metoprolol. Reported improvement in PVC burden with Entresto. Not a good candidate for many AA due to structural heart disease, no amio d/t cirrhosis.  Repeat echo in December 2024 showed EF 45 to 50%. He was referred to general cards for GDMT titration of heart failure meds.   The patient was last seen 06/12/23 for pre-op evaluation for resection of the metastatic renal cell carcinoma.   Today, the patient reports he has having exertional SOB and is waking up with SOB. No chest pain. This has been since the surgery 07/25/23. He denies lower leg edema. EKG shows Afib with rates in the 90s. He denies palpitations or heart racing. He is back and work and this seems to be limited due to SOB. He is not on a blood thinner. Reports dizziness and diarrhea with BB, but is willing to try again.  EKG from 2017 showed rapid Afib, this was in the in the post-op setting as well.   Studies Reviewed: Marland Kitchen   EKG Interpretation Date/Time:  Wednesday August 21 2023 10:26:26 EST Ventricular Rate:  97 PR Interval:    QRS Duration:  128 QT Interval:  364 QTC Calculation: 462 R Axis:   -43  Text Interpretation: Atrial fibrillation with premature ventricular or aberrantly conducted complexes Left axis deviation Left ventricular hypertrophy with QRS widening and repolarization abnormality ( R in aVL , Cornell product ) Cannot rule out Septal infarct  (cited on or before 12-Jun-2023) When compared with ECG of 12-Jun-2023 14:26, Atrial fibrillation has replaced Sinus rhythm Questionable change in initial forces of Septal leads T wave amplitude has increased in Inferior leads Confirmed by Fransico Michael, Yvan Dority ([id number]) on 08/21/2023 11:41:09 AM    Long term monitor, 02/15/2023 HR 50 - 102 bpm, average 71 bpm. Rare supraventricular ectopy. Occasional ventricular ectopy, 3.3%. No sustained arrhythmias. No atrial fibrillation.   TTE, 07/20/2022  1. Left ventricular ejection fraction, by estimation, is 40 to 45%. The left ventricle has mildly decreased function. The left ventricle has no regional wall motion abnormalities. Left ventricular diastolic parameters are consistent with Grade II diastolic dysfunction (pseudonormalization).   2. Right ventricular systolic function is normal. The right ventricular size is normal.   3. Left atrial size was moderately dilated.   4. The mitral valve is normal in structure. Mild mitral valve regurgitation. No evidence of mitral stenosis.   5. The aortic valve is normal in structure. Aortic valve regurgitation is not visualized. Aortic valve sclerosis is present, with no evidence of aortic valve stenosis. There is a 25 mm Magna pericardial valve present in the aortic position.   6. There is borderline dilatation of the aortic root, measuring 38 mm.   7. The inferior vena cava is normal in size with greater than 50% respiratory variability, suggesting right atrial pressure of 3 mmHg.    Long term monitor, 04/23/2022  Basic rhythm is NSR with BBB/IVCD with average HR 75 bpm.   PVC burden 24% with episodes of ventricular bigeminy and trigeminy   One 4 beat nonsustained VT   TTE, 03/02/2021  1. Left ventricular endocardial border not optimally defined to evaluate regional wall motion. Overall LV EF appears to be around 50%. Left ventricular diastolic parameters are consistent with Grade I diastolic dysfunction (impaired  relaxation).   2. Right ventricular systolic function is normal. The right ventricular size is moderately enlarged. There is normal pulmonary artery systolic pressure.   3. Left atrial size was mildly dilated.   4. Right atrial size was mildly dilated.   5. The mitral valve is normal in structure. Trivial mitral valve regurgitation. No evidence of mitral stenosis. Moderate mitral annular calcification.   6. The aortic valve has been repaired/replaced. There is a 25 mm Magna  pericardial valve present in the aortic position.  Aortic valve regurgitation is not visualized.   7. 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 S/P Bentall procedure. There is mild dilatation of the  aortic root, measuring 40 mm.   8. The inferior vena cava is normal in size with greater than 50% respiratory variability, suggesting right atrial pressure of 3 mmHg.   9. Recommend repeat limited study with definity to assess for wall motion abnormalities and get accurate assessment of EF       Physical Exam:   VS:  BP 122/77 (BP Location: Left Arm, Patient Position: Sitting, Cuff Size: Normal)   Pulse 97   Ht 5\' 11"  (1.803 m)   Wt 229 lb 6.4 oz (104.1 kg)   SpO2 97%   BMI 31.99 kg/m    Wt Readings from Last 3 Encounters:  08/21/23 229 lb 6.4 oz (104.1 kg)  06/12/23 229 lb 6.4 oz (104.1 kg)  06/03/23 229 lb (103.9 kg)    GEN: Well nourished, well developed in no acute distress NECK: No JVD; No carotid bruits CARDIAC: Irreg Irreg, no murmurs, rubs, gallops RESPIRATORY:  Clear to auscultation without rales, wheezing or rhonchi  ABDOMEN: Soft, non-tender, non-distended EXTREMITIES:  No edema; No deformity   ASSESSMENT AND PLAN: .    Paroxysmal Afib Patient had post-op afib in 2017 with no recurrence and was never started on DOAC. He underwent recent surgery 07/25/23, and since then has experienced SOB on exertion. EKG today shows Afib with HR 97bpm. On exam, he is not volume up. No  chest pain reported. CHADSVASC (age, CHF) at least 2. I will start Eliquis 5mg  BID. I will decrease Entresto and start Toprol 25mg  daily. He has documented intolerance but he said he is willing to try it again, may need to try Coreg. I will check Mag and TSH. I will see him back in 3 weeks. If he is still in Afib will consider cardioversion. I will refer to pulm for sleep study. If DCCV I unsuccessful, will have him see EP.  HFmrEF Echo 07/2022 showed LVEF 40-45%, possible from ?PVC burden. Echo in 06/2023 showed improved LVEF 45-50%, G1DD. He is not volume up at this time. Decrease Entresto to 24-26mg BID and start Toprol 25mg  daily. We will continue GDMT at follow-up.   PVCs Prior heart monitor showed PVC burden >20%. He has been followed by EP for this. AA limited by structural heart disease. No amio d/t liver cirrhosis.Did not tolerate BB d/t diarrhea, but will to try again (as above). Not a good candidate for invasive EP procedure to target PVCs given prosthetic valve  and probable location of PVCs. PVC improved on Entresto, from 20% down to 3%.  Sleep disorder Refer to pulmonology as above.     Dispo: follow-up in 3 weeks  Signed, Quaniyah Bugh David Stall, PA-C

## 2023-08-22 LAB — MAGNESIUM: Magnesium: 2.3 mg/dL (ref 1.6–2.3)

## 2023-08-26 ENCOUNTER — Ambulatory Visit: Payer: Medicare Other | Admitting: Sleep Medicine

## 2023-08-26 ENCOUNTER — Encounter: Payer: Self-pay | Admitting: Sleep Medicine

## 2023-08-26 VITALS — BP 120/90 | HR 83 | Temp 97.8°F | Ht 71.0 in | Wt 229.2 lb

## 2023-08-26 DIAGNOSIS — I4891 Unspecified atrial fibrillation: Secondary | ICD-10-CM | POA: Diagnosis not present

## 2023-08-26 DIAGNOSIS — G4710 Hypersomnia, unspecified: Secondary | ICD-10-CM

## 2023-08-26 DIAGNOSIS — E669 Obesity, unspecified: Secondary | ICD-10-CM | POA: Diagnosis not present

## 2023-08-26 DIAGNOSIS — G4733 Obstructive sleep apnea (adult) (pediatric): Secondary | ICD-10-CM

## 2023-08-26 NOTE — Progress Notes (Signed)
 Name:Aaron Cole MRN: [id number] DOB: [birth year]   CHIEF COMPLAINT:  EXCESSIVE DAYTIME SLEEPINESS   HISTORY OF PRESENT ILLNESS:  Aaron Cole is a 73 y.o. w/ a h/o HTN, atrial fibrillation and obesity who present for excessive daytime sleepiness which has been present for several years. Reports nocturnal awakenings due to nocturia, however does not have difficulty falling back to sleep. Denies any significant weight changes. Denies morning headaches, night sweats, dry mouth, RLS symptoms or dream enactment. Denies a family history of sleep apnea. Denies drowsy driving. Drinks 2 cups of coffee daily, denies alcohol, tobacco or illicit drug use.   Bedtime 10 pm Sleep onset 5-15 mins Rise time 7:20 am   EPWORTH SLEEP SCORE 6    08/26/2023    1:16 PM  Results of the Epworth flowsheet  Sitting and reading 0  Watching TV 2  Sitting, inactive in a public place (e.g. a theatre or a meeting) 0  As a passenger in a car for an hour without a break 0  Lying down to rest in the afternoon when circumstances permit 2  Sitting and talking to someone 0  Sitting quietly after a lunch without alcohol 2  In a car, while stopped for a few minutes in traffic 0  Total score 6     PAST MEDICAL HISTORY :   has a past medical history of CAD (coronary artery disease) (2013), CHF (congestive heart failure) (HCC), Cirrhosis of liver (HCC) (01/10/2019), CKD (chronic kidney disease) stage 3, GFR 30-59 ml/min (HCC) (2015), Diverticulosis, Hepatitis C virus infection without hepatic coma (03/02/2019), History of hepatitis B virus infection, HTN (hypertension), Renal cell carcinoma (HCC) (2013), S/p nephrectomy (2013), and Sinus headache.  has a past surgical history that includes Nasal sinus surgery (1978); rectal fistula repair (1980s); Nephrectomy (Right, 12/2011); Colonoscopy (11/2008); Bentall procedure (N/A, 07/05/2015); TEE without cardioversion (N/A, 07/05/2015); Coronary angioplasty; Colonoscopy  with propofol (N/A, 02/24/2019); Hydrocele surgery (Right, 02/05/2020); Esophagogastroduodenoscopy (egd) with propofol (N/A, 08/04/2020); and Cardiac valve replacement. Prior to Admission medications   Medication Sig Start Date End Date Taking? Authorizing Provider  apixaban (ELIQUIS) 5 MG TABS tablet Take 1 tablet (5 mg total) by mouth 2 (two) times daily. 08/21/23  Yes Furth, Cadence H, PA-C  fluticasone (FLONASE) 50 MCG/ACT nasal spray INHALE 2 SPRAYS INTO EACH NOSTRIL EVERY DAY. Patient taking differently: Place 2 sprays into both nostrils daily as needed for allergies. 06/27/16  Yes Eustaquio Boyden, MD  metoprolol succinate (TOPROL XL) 25 MG 24 hr tablet Take 1 tablet (25 mg total) by mouth daily. 08/21/23  Yes Furth, Cadence H, PA-C  sacubitril-valsartan (ENTRESTO) 24-26 MG Take 1 tablet by mouth 2 (two) times daily. 08/21/23  Yes Furth, Cadence H, PA-C   Allergies  Allergen Reactions   Carvedilol Other (See Comments)    Blurred vision, drowsiness   Metoprolol Diarrhea    FAMILY HISTORY:  family history includes CAD in his maternal grandfather; Cancer in his paternal aunt, paternal aunt, and paternal grandmother; Colon polyps in his brother and father; Coronary artery disease in his maternal uncle; Crohn's disease in his father; Diabetes in his father; Heart disease in his father and mother; Hypertension in his mother; Obesity in his mother. SOCIAL HISTORY:  reports that he has never smoked. He has never been exposed to tobacco smoke. He has never used smokeless tobacco. He reports that he does not currently use alcohol. He reports that he does not use drugs.   Review of Systems:  Gen:  Denies  fever, sweats, chills weight loss  HEENT: Denies blurred vision, double vision, ear pain, eye pain, hearing loss, nose bleeds, sore throat Cardiac:  No dizziness, chest pain or heaviness, chest tightness,edema, No JVD Resp:   No cough, -sputum production, -shortness of breath,-wheezing,  -hemoptysis,  Gi: Denies swallowing difficulty, stomach pain, nausea or vomiting, diarrhea, constipation, bowel incontinence Gu:  Denies bladder incontinence, burning urine Ext:   Denies Joint pain, stiffness or swelling Skin: Denies  skin rash, easy bruising or bleeding or hives Endoc:  Denies polyuria, polydipsia , polyphagia or weight change Psych:   Denies depression, insomnia or hallucinations  Other:  All other systems negative  VITAL SIGNS: BP (!) 120/90 (BP Location: Left Arm, Patient Position: Sitting, Cuff Size: Normal)   Pulse 83   Temp 97.8 F (36.6 C) (Temporal)   Ht 5\' 11"  (1.803 m)   Wt 229 lb 3.2 oz (104 kg)   SpO2 96%   BMI 31.97 kg/m    Physical Examination:   General Appearance: No distress  EYES PERRLA, EOM intact.   NECK Supple, No JVD Pulmonary: normal breath sounds, No wheezing.  CardiovascularNormal S1,S2.  No m/r/g.   Abdomen: Benign, Soft, non-tender. Skin:   warm, no rashes, no ecchymosis  Extremities: normal, no cyanosis, clubbing. Neuro:without focal findings,  speech normal  PSYCHIATRIC: Mood, affect within normal limits.   ASSESSMENT AND PLAN  OSA I suspect that OSA is likely present due to clinical presentation. Discussed the consequences of untreated sleep apnea. Advised not to drive drowsy for safety of patient and others. Will complete further evaluation with a home sleep study and follow up to review results.    Atrial fibrillation Stable, on current management. Following with PCP.   Obesity Counseled patient on diet and lifestyle modification.     MEDICATION ADJUSTMENTS/LABS AND TESTS ORDERED: Recommend Sleep Study   Patient  satisfied with Plan of action and management. All questions answered  Follow up to review HST results and treatment plan.   I spent a total of 30 minutes reviewing chart data, face-to-face evaluation with the patient, counseling and coordination of care as detailed above.    Tempie Hoist, M.D.   Sleep Medicine [facility]

## 2023-08-26 NOTE — Patient Instructions (Signed)
 Marland Kitchen

## 2023-09-09 ENCOUNTER — Inpatient Hospital Stay: Payer: Medicare Other | Admitting: Oncology

## 2023-09-09 ENCOUNTER — Telehealth: Payer: Self-pay | Admitting: Oncology

## 2023-09-09 NOTE — Telephone Encounter (Signed)
 Patient called to reschedule appointment. He said everything is all messed up today.  Appointment rescheduled as requested

## 2023-09-10 DIAGNOSIS — Z4889 Encounter for other specified surgical aftercare: Secondary | ICD-10-CM | POA: Diagnosis not present

## 2023-09-10 DIAGNOSIS — Z09 Encounter for follow-up examination after completed treatment for conditions other than malignant neoplasm: Secondary | ICD-10-CM | POA: Diagnosis not present

## 2023-09-10 DIAGNOSIS — C641 Malignant neoplasm of right kidney, except renal pelvis: Secondary | ICD-10-CM | POA: Diagnosis not present

## 2023-09-17 ENCOUNTER — Telehealth: Payer: Self-pay | Admitting: Oncology

## 2023-09-17 NOTE — Telephone Encounter (Signed)
 Pt called and asked what his appt with Dr.Finnegan was for. The notes on the appt stated it was a r/s appt from 3/10 for a surgery follow up.  Pt stated that he is doing the surgery follow ups at [facility] since they did the surgery. Pt canceled the appt with Dr.Finnegan and stated if he needs to be seen for another reason then he can r/s appts.

## 2023-09-18 ENCOUNTER — Inpatient Hospital Stay: Admitting: Oncology

## 2023-09-23 DIAGNOSIS — N183 Chronic kidney disease, stage 3 unspecified: Secondary | ICD-10-CM | POA: Diagnosis not present

## 2023-09-23 DIAGNOSIS — I10 Essential (primary) hypertension: Secondary | ICD-10-CM | POA: Diagnosis not present

## 2023-09-26 ENCOUNTER — Encounter: Payer: Self-pay | Admitting: Medical

## 2023-09-26 ENCOUNTER — Ambulatory Visit: Payer: Medicare Other | Attending: Medical | Admitting: Medical

## 2023-09-26 VITALS — BP 126/82 | HR 90 | Ht 71.0 in | Wt 226.6 lb

## 2023-09-26 DIAGNOSIS — I493 Ventricular premature depolarization: Secondary | ICD-10-CM | POA: Diagnosis not present

## 2023-09-26 DIAGNOSIS — I5020 Unspecified systolic (congestive) heart failure: Secondary | ICD-10-CM | POA: Diagnosis not present

## 2023-09-26 DIAGNOSIS — I480 Paroxysmal atrial fibrillation: Secondary | ICD-10-CM | POA: Insufficient documentation

## 2023-09-26 DIAGNOSIS — G479 Sleep disorder, unspecified: Secondary | ICD-10-CM | POA: Insufficient documentation

## 2023-09-26 NOTE — Progress Notes (Signed)
 Cardiology Office Note:  .   Date:  09/26/2023  ID:  Aaron Cole, DOB 04-Jul-1950, MRN [id number] PCP: Eustaquio Boyden, MD  [facility] Providers Cardiologist:  Lesleigh Noe, MD (Inactive) Electrophysiologist:  Lanier Prude, MD     History of Present Illness: .   Aaron Cole is a 72 y.o. 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 EF as low as 40-45% on echo in 2024.  He did not tolerate metoprolol. Reported improvement in PVC burden with Entresto. Not a good candidate for many AA due to structural heart disease, no amio d/t cirrhosis.  Repeat echo in December 2024 showed EF 45 to 50%. He was referred to general cards for GDMT titration of heart failure meds.   The patient was last seen 08/2023 reporting exertional SOB. He had recent surgery and EKG showed Afib Hr 97bpm. He was started on Eliquis. Entresto was decreased and Toprol was started. He was referred to pulmonology for sleep study.  Today, the patient is in Afib with HR 90bom. He denies chest pain or palpitations,  but does feel very SOB. No lower leg edema. He reports compliance with Eliquis. He is agreeable to cardioversion.  Studies Reviewed: Marland Kitchen   EKG Interpretation Date/Time:  Thursday September 26 2023 15:28:36 EDT Ventricular Rate:  90 PR Interval:    QRS Duration:  142 QT Interval:  400 QTC Calculation: 489 R Axis:   -40  Text Interpretation: Atrial fibrillation Left axis deviation Left ventricular hypertrophy with QRS widening ( R in aVL , Cornell product ) When compared with ECG of 21-Aug-2023 10:26, No significant change was found Confirmed by Fransico Michael, Alisea Matte ([id number]) on 09/26/2023 3:41:41 PM    Long term monitor, 02/15/2023 HR 50 - 102 bpm, average 71 bpm. Rare supraventricular ectopy. Occasional ventricular ectopy, 3.3%. No  sustained arrhythmias. No atrial fibrillation.   TTE, 07/20/2022  1. Left ventricular ejection fraction, by estimation, is 40 to 45%. The left ventricle has mildly decreased function. The left ventricle has no regional wall motion abnormalities. Left ventricular diastolic parameters are consistent with Grade II diastolic dysfunction (pseudonormalization).   2. Right ventricular systolic function is normal. The right ventricular size is normal.   3. Left atrial size was moderately dilated.   4. The mitral valve is normal in structure. Mild mitral valve regurgitation. No evidence of mitral stenosis.   5. The aortic valve is normal in structure. Aortic valve regurgitation is not visualized. Aortic valve sclerosis is present, with no evidence of aortic valve stenosis. There is a 25 mm Magna pericardial valve present in the aortic position.   6. There is borderline dilatation of the aortic root, measuring 38 mm.   7. The inferior vena cava is normal in size with greater than 50% respiratory variability, suggesting right atrial pressure of 3 mmHg.    Long term monitor, 04/23/2022   Basic rhythm is NSR with BBB/IVCD with average HR 75 bpm.   PVC burden 24% with episodes of ventricular bigeminy and trigeminy   One 4 beat nonsustained VT   TTE, 03/02/2021  1. Left ventricular endocardial border not optimally defined to evaluate regional wall motion. Overall LV EF appears to be around 50%. Left ventricular diastolic parameters are consistent with Grade I diastolic dysfunction (impaired relaxation).  2. Right ventricular systolic function is normal. The right ventricular size is moderately enlarged. There is normal pulmonary artery systolic pressure.   3. Left atrial size was mildly dilated.   4. Right atrial size was mildly dilated.   5. The mitral valve is normal in structure. Trivial mitral valve regurgitation. No evidence of mitral stenosis. Moderate mitral annular calcification.   6. The aortic valve  has been repaired/replaced. There is a 25 mm Magna  pericardial valve present in the aortic position.  Aortic valve regurgitation is not visualized.   7. 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 S/P Bentall procedure. There is mild dilatation of the  aortic root, measuring 40 mm.   8. The inferior vena cava is normal in size with greater than 50% respiratory variability, suggesting right atrial pressure of 3 mmHg.   9. Recommend repeat limited study with definity to assess for wall motion abnormalities and get accurate assessment of EF       Physical Exam:   VS:  BP 126/82   Pulse 90   Ht 5\' 11"  (1.803 m)   Wt 226 lb 9.6 oz (102.8 kg)   SpO2 97%   BMI 31.60 kg/m    Wt Readings from Last 3 Encounters:  09/26/23 226 lb 9.6 oz (102.8 kg)  08/26/23 229 lb 3.2 oz (104 kg)  08/21/23 229 lb 6.4 oz (104.1 kg)    GEN: Well nourished, well developed in no acute distress NECK: No JVD; No carotid bruits CARDIAC: Irreg Irreg, no murmurs, rubs, gallops RESPIRATORY:  Clear to auscultation without rales, wheezing or rhonchi  ABDOMEN: Soft, non-tender, non-distended EXTREMITIES:  No edema; No deformity   ASSESSMENT AND PLAN: .    Paroxysmal Afib The patient remains in Afib with a rate of 90bpm. He reports persistent SOB. He reports compliance with Eliquis. Denies any bleeding issues. He is agreeable to undergo cardioversion. Continue Toprol 25mg  daily for rate control and Eliquis 5mg  BID for stroke ppx.   HFmrEF Echo 07/2022 showed LVEF 40-45%, possible from PVC burden. Echo in 06/2023 showed improved EF 45-50%. He is euvolemic on exam. Continue Entresto 25-26mg BID and Toprol 25mg  daily. I will recheck an echocardiogram.   PVCs Prior heart monitor showed PVC burden >20%. He is followed by EP for this.  AA limited by structural heart disease. No amio d/t liver cirrhosis.Did not tolerate BB d/t diarrhea, but will to try again (as above). Not a good candidate for  invasive EP procedure to target PVCs given prosthetic valve and probable location of PVCs. PVC improved on Entresto, from 20% down to 3%.   Sleep disorder He is following with pulmonology. Plan for sleep study.    Informed Consent   Shared Decision Making/Informed Consent The risks (stroke, cardiac arrhythmias rarely resulting in the need for a temporary or permanent pacemaker, skin irritation or burns and complications associated with conscious sedation including aspiration, arrhythmia, respiratory failure and death), benefits (restoration of normal sinus rhythm) and alternatives of a direct current cardioversion were explained in detail to Aaron Cole and he agrees to proceed.       Dispo: Follow-up in 3-4 weeks  Signed, Yanitza Shvartsman David Stall, PA-C

## 2023-09-26 NOTE — Patient Instructions (Signed)
 Medication Instructions:  Your physician recommends that you continue on your current medications as directed. Please refer to the Current Medication list given to you today.   *If you need a refill on your cardiac medications before your next appointment, please call your pharmacy*  Lab Work: Your provider would like for you to have following labs drawn today (CBC, BMP).    Testing/Procedures: Your physician has requested that you have an echocardiogram. Echocardiography is a painless test that uses sound waves to create images of your heart. It provides your doctor with information about the size and shape of your heart and how well your heart's chambers and valves are working.   You may receive an ultrasound enhancing agent through an IV if needed to better visualize your heart during the echo. This procedure takes approximately one hour.  There are no restrictions for this procedure.  This will take place at [address] [facility], [place] [address]  Please note: We ask at that you not bring children with you during ultrasound (echo/ vascular) testing. Due to room size and safety concerns, children are not allowed in the ultrasound rooms during exams. Our front office staff cannot provide observation of children in our lobby area while testing is being conducted. An adult accompanying a patient to their appointment will only be allowed in the ultrasound room at the discretion of the ultrasound technician under special circumstances. We apologize for any inconvenience.       Dear Aaron Cole  You are scheduled for a Cardioversion on Friday, April 4 with Dr. Azucena Cecil.  Please arrive at the [facility] Entrance of [facility], [address], [place] [address] at 6:30 AM (This is 1 hour(s) prior to your procedure time).  Proceed to the Check-In Desk directly inside the entrance.  Procedure Parking: Use the entrance off of the [address] side of  the hospital. Turn right upon entering and follow the driveway to parking that is directly in front of the Heart & Vascular Center. There is no valet parking available at this entrance, however there is an awning directly in front of the Heart & Vascular Center for drop off/ pick up for patients.    DIET:  Nothing to eat or drink after midnight except a sip of water with medications (see medication instructions below)  Continue taking your anticoagulant (blood thinner): Apixaban (Eliquis).  You will need to continue this after your procedure until you are told by your provider that it is safe to stop.    LABS: Today 9CBC, BMP)  FYI:  For your safety, and to allow [place] to monitor your vital signs accurately during the surgery/procedure we request: If you have artificial nails, gel coating, SNS etc, please have those removed prior to your surgery/procedure. Not having the nail coverings /polish removed may result in cancellation or delay of your surgery/procedure.  Your support person will be asked to wait in the waiting room during your procedure.  It is OK to have someone drop you off and come back when you are ready to be discharged.  You cannot drive after the procedure and will need someone to drive you home.  Bring your insurance cards.  *Special Note: Every effort is made to have your procedure done on time. Occasionally there are emergencies that occur at the hospital that may cause delays. Please be patient if a delay does occur.      Follow-Up: At [facility], you  and your health needs are our priority.  As part of our continuing mission to provide you with exceptional heart care, our providers are all part of one team.  This team includes your primary Cardiologist (physician) and Advanced Practice Providers or APPs (Physician Assistants and Nurse Practitioners) who all work together to provide you with the care you need, when you need it.  Your next appointment:   3  week(s)  Provider:   Terrilee Croak, PA-C

## 2023-09-26 NOTE — H&P (View-Only) (Signed)
 Cardiology Office Note:  .   Date:  09/26/2023  ID:  Aaron Cole, DOB 04-Jul-1950, MRN [id number] PCP: Eustaquio Boyden, MD  [facility] Providers Cardiologist:  Lesleigh Noe, MD (Inactive) Electrophysiologist:  Lanier Prude, MD     History of Present Illness: .   Aaron Cole is a 72 y.o. 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 EF as low as 40-45% on echo in 2024.  He did not tolerate metoprolol. Reported improvement in PVC burden with Entresto. Not a good candidate for many AA due to structural heart disease, no amio d/t cirrhosis.  Repeat echo in December 2024 showed EF 45 to 50%. He was referred to general cards for GDMT titration of heart failure meds.   The patient was last seen 08/2023 reporting exertional SOB. He had recent surgery and EKG showed Afib Hr 97bpm. He was started on Eliquis. Entresto was decreased and Toprol was started. He was referred to pulmonology for sleep study.  Today, the patient is in Afib with HR 90bom. He denies chest pain or palpitations,  but does feel very SOB. No lower leg edema. He reports compliance with Eliquis. He is agreeable to cardioversion.  Studies Reviewed: Marland Kitchen   EKG Interpretation Date/Time:  Thursday September 26 2023 15:28:36 EDT Ventricular Rate:  90 PR Interval:    QRS Duration:  142 QT Interval:  400 QTC Calculation: 489 R Axis:   -40  Text Interpretation: Atrial fibrillation Left axis deviation Left ventricular hypertrophy with QRS widening ( R in aVL , Cornell product ) When compared with ECG of 21-Aug-2023 10:26, No significant change was found Confirmed by Fransico Michael, Alisea Matte ([id number]) on 09/26/2023 3:41:41 PM    Long term monitor, 02/15/2023 HR 50 - 102 bpm, average 71 bpm. Rare supraventricular ectopy. Occasional ventricular ectopy, 3.3%. No  sustained arrhythmias. No atrial fibrillation.   TTE, 07/20/2022  1. Left ventricular ejection fraction, by estimation, is 40 to 45%. The left ventricle has mildly decreased function. The left ventricle has no regional wall motion abnormalities. Left ventricular diastolic parameters are consistent with Grade II diastolic dysfunction (pseudonormalization).   2. Right ventricular systolic function is normal. The right ventricular size is normal.   3. Left atrial size was moderately dilated.   4. The mitral valve is normal in structure. Mild mitral valve regurgitation. No evidence of mitral stenosis.   5. The aortic valve is normal in structure. Aortic valve regurgitation is not visualized. Aortic valve sclerosis is present, with no evidence of aortic valve stenosis. There is a 25 mm Magna pericardial valve present in the aortic position.   6. There is borderline dilatation of the aortic root, measuring 38 mm.   7. The inferior vena cava is normal in size with greater than 50% respiratory variability, suggesting right atrial pressure of 3 mmHg.    Long term monitor, 04/23/2022   Basic rhythm is NSR with BBB/IVCD with average HR 75 bpm.   PVC burden 24% with episodes of ventricular bigeminy and trigeminy   One 4 beat nonsustained VT   TTE, 03/02/2021  1. Left ventricular endocardial border not optimally defined to evaluate regional wall motion. Overall LV EF appears to be around 50%. Left ventricular diastolic parameters are consistent with Grade I diastolic dysfunction (impaired relaxation).  2. Right ventricular systolic function is normal. The right ventricular size is moderately enlarged. There is normal pulmonary artery systolic pressure.   3. Left atrial size was mildly dilated.   4. Right atrial size was mildly dilated.   5. The mitral valve is normal in structure. Trivial mitral valve regurgitation. No evidence of mitral stenosis. Moderate mitral annular calcification.   6. The aortic valve  has been repaired/replaced. There is a 25 mm Magna  pericardial valve present in the aortic position.  Aortic valve regurgitation is not visualized.   7. 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 S/P Bentall procedure. There is mild dilatation of the  aortic root, measuring 40 mm.   8. The inferior vena cava is normal in size with greater than 50% respiratory variability, suggesting right atrial pressure of 3 mmHg.   9. Recommend repeat limited study with definity to assess for wall motion abnormalities and get accurate assessment of EF       Physical Exam:   VS:  BP 126/82   Pulse 90   Ht 5\' 11"  (1.803 m)   Wt 226 lb 9.6 oz (102.8 kg)   SpO2 97%   BMI 31.60 kg/m    Wt Readings from Last 3 Encounters:  09/26/23 226 lb 9.6 oz (102.8 kg)  08/26/23 229 lb 3.2 oz (104 kg)  08/21/23 229 lb 6.4 oz (104.1 kg)    GEN: Well nourished, well developed in no acute distress NECK: No JVD; No carotid bruits CARDIAC: Irreg Irreg, no murmurs, rubs, gallops RESPIRATORY:  Clear to auscultation without rales, wheezing or rhonchi  ABDOMEN: Soft, non-tender, non-distended EXTREMITIES:  No edema; No deformity   ASSESSMENT AND PLAN: .    Paroxysmal Afib The patient remains in Afib with a rate of 90bpm. He reports persistent SOB. He reports compliance with Eliquis. Denies any bleeding issues. He is agreeable to undergo cardioversion. Continue Toprol 25mg  daily for rate control and Eliquis 5mg  BID for stroke ppx.   HFmrEF Echo 07/2022 showed LVEF 40-45%, possible from PVC burden. Echo in 06/2023 showed improved EF 45-50%. He is euvolemic on exam. Continue Entresto 25-26mg BID and Toprol 25mg  daily. I will recheck an echocardiogram.   PVCs Prior heart monitor showed PVC burden >20%. He is followed by EP for this.  AA limited by structural heart disease. No amio d/t liver cirrhosis.Did not tolerate BB d/t diarrhea, but will to try again (as above). Not a good candidate for  invasive EP procedure to target PVCs given prosthetic valve and probable location of PVCs. PVC improved on Entresto, from 20% down to 3%.   Sleep disorder He is following with pulmonology. Plan for sleep study.    Informed Consent   Shared Decision Making/Informed Consent The risks (stroke, cardiac arrhythmias rarely resulting in the need for a temporary or permanent pacemaker, skin irritation or burns and complications associated with conscious sedation including aspiration, arrhythmia, respiratory failure and death), benefits (restoration of normal sinus rhythm) and alternatives of a direct current cardioversion were explained in detail to Mr. Bellisario and he agrees to proceed.       Dispo: Follow-up in 3-4 weeks  Signed, Yanitza Shvartsman David Stall, PA-C

## 2023-09-27 LAB — BASIC METABOLIC PANEL WITH GFR
BUN/Creatinine Ratio: 16 (ref 10–24)
BUN: 23 mg/dL (ref 8–27)
CO2: 20 mmol/L (ref 20–29)
Calcium: 9 mg/dL (ref 8.6–10.2)
Chloride: 107 mmol/L — ABNORMAL HIGH (ref 96–106)
Creatinine, Ser: 1.4 mg/dL — ABNORMAL HIGH (ref 0.76–1.27)
Glucose: 84 mg/dL (ref 70–99)
Potassium: 4.6 mmol/L (ref 3.5–5.2)
Sodium: 141 mmol/L (ref 134–144)
eGFR: 54 mL/min/{1.73_m2} — ABNORMAL LOW (ref >59)

## 2023-09-27 LAB — CBC
Hematocrit: 42.7 % (ref 37.5–51.0)
Hemoglobin: 13.9 g/dL (ref 13.0–17.7)
MCH: 28.8 pg (ref 26.6–33.0)
MCHC: 32.6 g/dL (ref 31.5–35.7)
MCV: 88 fL (ref 79–97)
Platelets: 151 10*3/uL (ref 150–450)
RBC: 4.83 x10E6/uL (ref 4.14–5.80)
RDW: 13.6 % (ref 11.6–15.4)
WBC: 6.2 10*3/uL (ref 3.4–10.8)

## 2023-09-30 ENCOUNTER — Telehealth: Payer: Self-pay | Admitting: Cardiology

## 2023-09-30 NOTE — Telephone Encounter (Signed)
 Called and spoke with patient. Patient requesting to have his cardioversion sooner. Patient's cardioversion rescheduled for 10/02/23 from 10/04/23.

## 2023-09-30 NOTE — Telephone Encounter (Signed)
  Patient would like to see if he can get the cardioversion done sooner than his 10/04/23 scheduled procedure. Please advise.

## 2023-10-01 DIAGNOSIS — N1831 Chronic kidney disease, stage 3a: Secondary | ICD-10-CM | POA: Diagnosis not present

## 2023-10-02 ENCOUNTER — Ambulatory Visit
Admission: RE | Admit: 2023-10-02 | Discharge: 2023-10-02 | Disposition: A | Discharge status: Home / Self Care | Attending: Cardiology | Admitting: Cardiology

## 2023-10-02 ENCOUNTER — Ambulatory Visit: Payer: Self-pay | Admitting: Certified Registered"

## 2023-10-02 ENCOUNTER — Encounter: Payer: Self-pay | Admitting: Cardiology

## 2023-10-02 ENCOUNTER — Encounter
Admission: RE | Disposition: A | Discharge status: Home / Self Care | Payer: Self-pay | Source: Home / Self Care | Attending: Cardiology

## 2023-10-02 DIAGNOSIS — I5022 Chronic systolic (congestive) heart failure: Secondary | ICD-10-CM | POA: Insufficient documentation

## 2023-10-02 DIAGNOSIS — I4819 Other persistent atrial fibrillation: Secondary | ICD-10-CM

## 2023-10-02 DIAGNOSIS — I509 Heart failure, unspecified: Secondary | ICD-10-CM | POA: Diagnosis not present

## 2023-10-02 DIAGNOSIS — I480 Paroxysmal atrial fibrillation: Secondary | ICD-10-CM | POA: Insufficient documentation

## 2023-10-02 DIAGNOSIS — Z7901 Long term (current) use of anticoagulants: Secondary | ICD-10-CM | POA: Diagnosis not present

## 2023-10-02 DIAGNOSIS — I4891 Unspecified atrial fibrillation: Secondary | ICD-10-CM | POA: Diagnosis not present

## 2023-10-02 DIAGNOSIS — N183 Chronic kidney disease, stage 3 unspecified: Secondary | ICD-10-CM | POA: Diagnosis not present

## 2023-10-02 DIAGNOSIS — I2510 Atherosclerotic heart disease of native coronary artery without angina pectoris: Secondary | ICD-10-CM | POA: Diagnosis not present

## 2023-10-02 DIAGNOSIS — I130 Hypertensive heart and chronic kidney disease with heart failure and stage 1 through stage 4 chronic kidney disease, or unspecified chronic kidney disease: Secondary | ICD-10-CM | POA: Diagnosis not present

## 2023-10-02 HISTORY — PX: CARDIOVERSION: SHX1299

## 2023-10-02 HISTORY — DX: Cardiac arrhythmia, unspecified: I49.9

## 2023-10-02 SURGERY — CARDIOVERSION
Anesthesia: General

## 2023-10-02 MED ORDER — PROPOFOL 10 MG/ML IV BOLUS
INTRAVENOUS | Status: DC | PRN
Start: 2023-10-02 — End: 2023-10-02
  Administered 2023-10-02: 50 mg via INTRAVENOUS
  Administered 2023-10-02: 30 mg via INTRAVENOUS

## 2023-10-02 MED ORDER — SODIUM CHLORIDE 0.9 % IV SOLN: INTRAVENOUS | Status: DC | PRN

## 2023-10-02 MED ORDER — PROPOFOL 1000 MG/100ML IV EMUL
INTRAVENOUS | Status: AC
  Filled 2023-10-02: qty 100

## 2023-10-02 MED ORDER — SODIUM CHLORIDE 0.9% FLUSH: 3.0000 mL | INTRAVENOUS | Status: DC | PRN

## 2023-10-02 MED ORDER — SODIUM CHLORIDE 0.9% FLUSH
3.0000 mL | Freq: Two times a day (BID) | INTRAVENOUS | Status: DC
Start: 2023-10-02 — End: 2023-10-02

## 2023-10-02 NOTE — Transfer of Care (Signed)
 Immediate Anesthesia Transfer of Care Note  Patient: Aaron Cole  Procedure(s) Performed: CARDIOVERSION  Patient Location: PACU  Anesthesia Type:General  Level of Consciousness: sedated  Airway & Oxygen Therapy: Patient Spontanous Breathing and Patient connected to nasal cannula oxygen  Post-op Assessment: Report given to RN and Post -op Vital signs reviewed and stable  Post vital signs: Reviewed and stable  Last Vitals:  Vitals Value Taken Time  BP    Temp    Pulse    Resp    SpO2      Last Pain:  Vitals:   10/02/23 0655  TempSrc: Oral  PainSc: 0-No pain         Complications: No notable events documented.

## 2023-10-02 NOTE — Procedures (Signed)
 Cardioversion procedure note For atrial fibrillation.  Procedure Details:  Consent: Risks of procedure as well as the alternatives and risks of each were explained to the (patient/caregiver).  Consent for procedure obtained.  Time Out: Verified patient identification, verified procedure, site/side was marked, verified correct patient position, special equipment/implants available, medications/allergies/relevent history reviewed, required imaging and test results available.  Performed  Patient placed on cardiac monitor, pulse oximetry, supplemental oxygen as necessary.   Sedation given: propofol IV per anesthesia team  Pacer pads placed anterior and posterior chest.  Cardioverted 1 time(s).   Cardioverted at  200J. Synchronized biphasic Converted to NSR  Evaluation: Findings: Post procedure EKG shows: NSR/Sinus bradycardia Complications: None Patient did tolerate procedure well.  Time Spent Directly with the Patient:  45 minutes   Debbe Odea, M.D.

## 2023-10-02 NOTE — Interval H&P Note (Signed)
 History and Physical Interval Note:  10/02/2023 7:52 AM  Aaron Cole  has presented today for surgery, with the diagnosis of Persistent Afib.  The various methods of treatment have been discussed with the patient and family. After consideration of risks, benefits and other options for treatment, the patient has consented to  Procedure(s): CARDIOVERSION (N/A) as a surgical intervention.  The patient's history has been reviewed, patient examined, no change in status, stable for surgery.  I have reviewed the patient's chart and labs.  Questions were answered to the patient's satisfaction.     Arlys John Agbor-Etang

## 2023-10-02 NOTE — Anesthesia Postprocedure Evaluation (Signed)
 Anesthesia Post Note  Patient: Aaron Cole  Procedure(s) Performed: CARDIOVERSION  Patient location during evaluation: PACU Anesthesia Type: General Level of consciousness: awake and alert Pain management: pain level controlled Vital Signs Assessment: post-procedure vital signs reviewed and stable Respiratory status: spontaneous breathing, nonlabored ventilation, respiratory function stable and patient connected to nasal cannula oxygen Cardiovascular status: blood pressure returned to baseline and stable Postop Assessment: no apparent nausea or vomiting Anesthetic complications: no   No notable events documented.   Last Vitals:  Vitals:   10/02/23 0815 10/02/23 0830  BP: 115/81 118/70  Pulse: (!) 54 (!) 56  Resp: 17 20  Temp:    SpO2: 94% 97%    Last Pain:  Vitals:   10/02/23 0830  TempSrc:   PainSc: 0-No pain                 Yevette Edwards

## 2023-10-02 NOTE — Anesthesia Preprocedure Evaluation (Signed)
 Anesthesia Evaluation  Patient identified by MRN, date of birth, ID band Patient awake    Reviewed: Allergy & Precautions, H&P , NPO status , Patient's Chart, lab work & pertinent test results, reviewed documented beta blocker date and time   Airway Mallampati: II   Neck ROM: full    Dental  (+) Poor Dentition   Pulmonary neg pulmonary ROS   Pulmonary exam normal        Cardiovascular Exercise Tolerance: Poor hypertension, On Medications + CAD and +CHF  (-) Orthopnea and (-) PND + dysrhythmias Atrial Fibrillation  Rhythm:regular Rate:Normal     Neuro/Psych  Headaches  negative psych ROS   GI/Hepatic negative GI ROS,,,(+) Hepatitis -  Endo/Other  negative endocrine ROS    Renal/GU Renal disease  negative genitourinary   Musculoskeletal   Abdominal   Peds  Hematology negative hematology ROS (+)   Anesthesia Other Findings Past Medical History: 2013: CAD (coronary artery disease)     Comment:  per CT scan  - brings CD of CT, no report - will need               [facility] records of CT scan report No date: CHF (congestive heart failure) (HCC) 01/10/2019: Cirrhosis of liver (HCC)     Comment:  By [place] 03/2020 2015: CKD (chronic kidney disease) stage 3, GFR 30-59 ml/min (HCC)     Comment:  iatrogenic (decreased nephron mass) established with Dr.              Arvin Collard [facility]) No date: Diverticulosis     Comment:  by CT scan No date: Dysrhythmia 03/02/2019: Hepatitis C virus infection without hepatic coma No date: History of hepatitis B virus infection     Comment:  spontaneously cleared No date: HTN (hypertension) 2013: Renal cell carcinoma (HCC)     Comment:  clear cell s/p R nephrectomy 2013: S/p nephrectomy     Comment:  [facility] No date: Sinus headache     Comment:  recurrent Past Surgical History: 07/05/2015: BENTALL PROCEDURE; N/A     Comment:  Procedure: BENTALL PROCEDURE UTILIZING A 28 X 10 MM               WOVEN  DOUBLE VELOUR VASCULAR GRAFT AND A GELWEAVE               WOVEN VASCULAR GRAFT. AORTIC VALVE REPLACEMENT WITH               AORTIC MAGNA EASE PERICARDIAL AORTIC VALVE.;  Surgeon:               Alleen Borne, MD;  Location: [facility] OR;  Service: Open               Heart Surgery;  Laterality: N/A; No date: CARDIAC VALVE REPLACEMENT 11/2008: COLONOSCOPY     Comment:  [facility] records in chart Servando Snare) 02/24/2019: COLONOSCOPY WITH PROPOFOL; N/A     Comment:  TA, HP, rpt 5 yrs ([facility]) No date: CORONARY ANGIOPLASTY 08/04/2020: ESOPHAGOGASTRODUODENOSCOPY (EGD) WITH PROPOFOL; N/A     Comment:  Procedure: ESOPHAGOGASTRODUODENOSCOPY (EGD) WITH               PROPOFOL;  Surgeon: Wyline Mood, MD;  Location: [facility]               ENDOSCOPY;  Service: Gastroenterology;  Laterality: N/A; 02/05/2020: HYDROCELE EXCISION; Right     Comment:  Procedure: HYDROCELECTOMY ADULT;  Surgeon: Richardo Hanks,  Laurette Schimke, MD;  Location: [facility] ORS;  Service: Urology;                Laterality: Right; 1978: NASAL SINUS SURGERY     Comment:  sinus surgery done at [facility] 12/2011: NEPHRECTOMY; Right     Comment:  [facility], R for RCC 1980s: rectal fistula repair 07/05/2015: TEE WITHOUT CARDIOVERSION; N/A     Comment:  Procedure: TRANSESOPHAGEAL ECHOCARDIOGRAM (TEE);                Surgeon: Alleen Borne, MD;  Location: [facility] OR;  Service:               Open Heart Surgery;  Laterality: N/A; BMI    Body Mass Index: 31.94 kg/m     Reproductive/Obstetrics negative OB ROS                             Anesthesia Physical Anesthesia Plan  ASA: 3  Anesthesia Plan: General   Post-op Pain Management:    Induction:   PONV Risk Score and Plan:   Airway Management Planned:   Additional Equipment:   Intra-op Plan:   Post-operative Plan:   Informed Consent: I have reviewed the patients History and Physical, chart, labs and discussed the procedure including the risks, benefits and alternatives for  the proposed anesthesia with the patient or authorized representative who has indicated his/her understanding and acceptance.     Dental Advisory Given  Plan Discussed with: CRNA  Anesthesia Plan Comments:        Anesthesia Quick Evaluation

## 2023-10-03 ENCOUNTER — Encounter: Payer: Self-pay | Admitting: Cardiology

## 2023-10-22 ENCOUNTER — Ambulatory Visit: Attending: Medical

## 2023-10-22 DIAGNOSIS — I5020 Unspecified systolic (congestive) heart failure: Secondary | ICD-10-CM | POA: Diagnosis not present

## 2023-10-23 LAB — ECHOCARDIOGRAM COMPLETE: S' Lateral: 4.68 cm

## 2023-10-24 ENCOUNTER — Ambulatory Visit: Attending: Medical | Admitting: Medical

## 2023-10-24 ENCOUNTER — Encounter: Payer: Self-pay | Admitting: Medical

## 2023-10-24 VITALS — BP 124/90 | HR 101 | Ht 71.0 in | Wt 225.5 lb

## 2023-10-24 DIAGNOSIS — Z952 Presence of prosthetic heart valve: Secondary | ICD-10-CM | POA: Diagnosis not present

## 2023-10-24 DIAGNOSIS — I10 Essential (primary) hypertension: Secondary | ICD-10-CM | POA: Diagnosis not present

## 2023-10-24 DIAGNOSIS — I5020 Unspecified systolic (congestive) heart failure: Secondary | ICD-10-CM | POA: Diagnosis not present

## 2023-10-24 DIAGNOSIS — I493 Ventricular premature depolarization: Secondary | ICD-10-CM | POA: Diagnosis not present

## 2023-10-24 DIAGNOSIS — I4819 Other persistent atrial fibrillation: Secondary | ICD-10-CM | POA: Insufficient documentation

## 2023-10-24 MED ORDER — METOPROLOL SUCCINATE ER 50 MG PO TB24: 50.0000 mg | ORAL_TABLET | Freq: Every day | ORAL | 3 refills | Status: DC

## 2023-10-24 NOTE — Progress Notes (Signed)
 Cardiology Office Note:  .   Date:  10/24/2023  ID:  Aaron Cole, DOB 1950/11/06, MRN [id number] PCP: Claire Crick, MD  [facility] Providers Cardiologist:  Mickiel Albany, MD (Inactive) Electrophysiologist:  Boyce Byes, MD {  History of Present Illness: .   Aaron Cole is a 73 y.o. 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 EF as low as 40-45% on echo in 2024.  He did not tolerate metoprolol . Reported improvement in PVC burden with Entresto . Not a good candidate for many AA due to structural heart disease, no amio d/t cirrhosis.  Repeat echo in December 2024 showed EF 45 to 50%. He was referred to general cards for GDMT titration of heart failure meds.    The patient was seen 08/2023 reporting exertional SOB. He had recent surgery and EKG showed Afib Hr 97bpm. He was started on Eliquis . Entresto  was decreased and Toprol  was started. He was referred to pulmonology for sleep study. He remained in Afib and ultimately underwent cardioversion 10/02/23. Repeat echo showed LVEF 25-30%, mild to mod MR.  Today, the patient is back in Afib with HR 101bpm. He says a week ago he went back into Afib. He feels DOE when in Afib. He denies chest pain or LLE. He reports compliance with Eliquis .    Studies Reviewed: Aaron Aas   EKG Interpretation Date/Time:  Thursday October 24 2023 14:29:17 EDT Ventricular Rate:  101 PR Interval:    QRS Duration:  138 QT Interval:  396 QTC Calculation: 513 R Axis:   -40  Text Interpretation: Atrial fibrillation with rapid ventricular response Left axis deviation Left ventricular hypertrophy with QRS widening ( R in aVL , Cornell product ) T wave abnormality, consider lateral ischemia When compared with ECG of 02-Oct-2023 07:49, PREVIOUS ECG IS PRESENT Confirmed by Gennaro Khat, Loree Shehata  [phone]) on 10/24/2023 2:50:18 PM    Echo 10/22/23  1. Left ventricular ejection fraction, by estimation, is 25 to 30%. The  left ventricle has severely decreased function. The left ventricle  demonstrates global hypokinesis. Left ventricular diastolic parameters are  indeterminate.   2. Right ventricular systolic function is normal. The right ventricular  size is normal.   3. Left atrial size was severely dilated.   4. The mitral valve is normal in structure. Mild to moderate mitral valve  regurgitation. No evidence of mitral stenosis. Moderate mitral annular  calcification.   5. The aortic valve has been repaired/replaced. Aortic valve  regurgitation is not visualized. No aortic stenosis is present.   6. The inferior vena cava is normal in size with greater than 50%  respiratory variability, suggesting right atrial pressure of 3 mmHg.   Comparison(s): EF has significantly decreased since prior exam 06/2023.    Long term monitor, 02/15/2023 HR 50 - 102 bpm, average 71 bpm. Rare supraventricular ectopy. Occasional ventricular ectopy, 3.3%. No sustained arrhythmias. No atrial fibrillation.   TTE, 07/20/2022  1. Left ventricular ejection fraction, by estimation, is 40 to 45%. The left ventricle has mildly decreased function. The left ventricle has no regional wall motion abnormalities. Left ventricular diastolic parameters are consistent with Grade II diastolic dysfunction (pseudonormalization).   2. Right ventricular systolic function is normal. The right ventricular size is normal.   3. Left atrial size was moderately dilated.  4. The mitral valve is normal in structure. Mild mitral valve regurgitation. No evidence of mitral stenosis.   5. The aortic valve is normal in structure. Aortic valve regurgitation is not visualized. Aortic valve sclerosis is present, with no evidence of aortic valve stenosis. There is a 25 mm Magna pericardial valve present in the aortic position.   6. There is  borderline dilatation of the aortic root, measuring 38 mm.   7. The inferior vena cava is normal in size with greater than 50% respiratory variability, suggesting right atrial pressure of 3 mmHg.    Long term monitor, 04/23/2022   Basic rhythm is NSR with BBB/IVCD with average HR 75 bpm.   PVC burden 24% with episodes of ventricular bigeminy and trigeminy   One 4 beat nonsustained VT   TTE, 03/02/2021  1. Left ventricular endocardial border not optimally defined to evaluate regional wall motion. Overall LV EF appears to be around 50%. Left ventricular diastolic parameters are consistent with Grade I diastolic dysfunction (impaired relaxation).   2. Right ventricular systolic function is normal. The right ventricular size is moderately enlarged. There is normal pulmonary artery systolic pressure.   3. Left atrial size was mildly dilated.   4. Right atrial size was mildly dilated.   5. The mitral valve is normal in structure. Trivial mitral valve regurgitation. No evidence of mitral stenosis. Moderate mitral annular calcification.   6. The aortic valve has been repaired/replaced. There is a 25 mm Magna  pericardial valve present in the aortic position.  Aortic valve regurgitation is not visualized.   7. 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 S/P Bentall procedure. There is mild dilatation of the  aortic root, measuring 40 mm.   8. The inferior vena cava is normal in size with greater than 50% respiratory variability, suggesting right atrial pressure of 3 mmHg.   9. Recommend repeat limited study with definity to assess for wall motion abnormalities and get accurate assessment of EF   Physical Exam:   VS:  BP (!) 124/90 (BP Location: Left Arm, Patient Position: Sitting, Cuff Size: Normal)   Pulse (!) 101   Ht 5\' 11"  (1.803 m)   Wt 225 lb 8 oz (102.3 kg)   SpO2 97%   BMI 31.45 kg/m    Wt Readings from Last 3 Encounters:  10/24/23 225 lb 8 oz (102.3 kg)   10/02/23 229 lb (103.9 kg)  09/26/23 226 lb 9.6 oz (102.8 kg)    GEN: Well nourished, well developed in no acute distress NECK: No JVD; No carotid bruits CARDIAC: Irreg Irreg, no murmurs, rubs, gallops RESPIRATORY:  Clear to auscultation without rales, wheezing or rhonchi  ABDOMEN: Soft, non-tender, non-distended EXTREMITIES:  No edema; No deformity   ASSESSMENT AND PLAN: .    Persistent Afib He is s/p DCCV and is back in Afib today on EKG. HR is 101bpm. He feels SOB on exertion. I will increase Toprol  to 50mg  daily. I will refer to EP for further recommendations. Continue Eliquis  5mg  BID for stroke ppx. May need AA to remain in NSR.  HFmrEF Echo 07/2022 showed LVEF 40-45%, suspected from PVC burden. Echo 06/2023 showed improved EF 45-50%. Repeat echo showed reduced EF 25-30% in the setting of Afib. He is euvolemic on exam. Contnue Toprol  and Entresto . Continue GDMT at follow-up. Can repeat an echo once he is back in NSR.  PVCs PVC burden >20% improved down to 3% on Entresto . No amio d/t liver cirrhosis. He is not a  good candidate for invasive EP procedures due to prosthetic valve. He is followed by EP.  S/p AVR with Bentall procedure Valve appears stable on most recent echo.   Sleep disorder Plan on sleep study.        Dispo: Follow-up in 3 months  Signed, Sujay Grundman Rebekah Canada, PA-C

## 2023-10-24 NOTE — Patient Instructions (Signed)
 Medication Instructions:  Your physician recommends the following medication changes.  INCREASE: Metoprolol  to 50 mg daily *If you need a refill on your cardiac medications before your next appointment, please call your pharmacy*  Lab Work: No labs ordered today  If you have labs (blood work) drawn today and your tests are completely normal, you will receive your results only by: MyChart Message (if you have MyChart) OR A paper copy in the mail If you have any lab test that is abnormal or we need to change your treatment, we will call you to review the results.  Testing/Procedures: No test ordered today   Follow-Up: At [facility], you and your health needs are our priority.  As part of our continuing mission to provide you with exceptional heart care, our providers are all part of one team.  This team includes your primary Cardiologist (physician) and Advanced Practice Providers or APPs (Physician Assistants and Nurse Practitioners) who all work together to provide you with the care you need, when you need it.  Your next appointment:   3 month(s)  Provider:   Cadence Gennaro Khat, PA-C    Your physician recommends that you schedule a follow-up appointment as soon as possible with EP.

## 2023-11-03 ENCOUNTER — Encounter

## 2023-11-03 DIAGNOSIS — G4733 Obstructive sleep apnea (adult) (pediatric): Secondary | ICD-10-CM

## 2023-11-03 DIAGNOSIS — G4731 Primary central sleep apnea: Secondary | ICD-10-CM | POA: Diagnosis not present

## 2023-11-05 ENCOUNTER — Institutional Professional Consult (permissible substitution): Admitting: Cardiology

## 2023-11-11 DIAGNOSIS — G4733 Obstructive sleep apnea (adult) (pediatric): Secondary | ICD-10-CM | POA: Diagnosis not present

## 2023-11-11 DIAGNOSIS — K862 Cyst of pancreas: Secondary | ICD-10-CM | POA: Diagnosis not present

## 2023-11-11 DIAGNOSIS — C641 Malignant neoplasm of right kidney, except renal pelvis: Secondary | ICD-10-CM | POA: Diagnosis not present

## 2023-11-11 DIAGNOSIS — R918 Other nonspecific abnormal finding of lung field: Secondary | ICD-10-CM | POA: Diagnosis not present

## 2023-11-11 DIAGNOSIS — E278 Other specified disorders of adrenal gland: Secondary | ICD-10-CM | POA: Diagnosis not present

## 2023-11-11 DIAGNOSIS — R935 Abnormal findings on diagnostic imaging of other abdominal regions, including retroperitoneum: Secondary | ICD-10-CM | POA: Diagnosis not present

## 2023-11-11 DIAGNOSIS — E896 Postprocedural adrenocortical (-medullary) hypofunction: Secondary | ICD-10-CM | POA: Diagnosis not present

## 2023-11-11 DIAGNOSIS — Z905 Acquired absence of kidney: Secondary | ICD-10-CM | POA: Diagnosis not present

## 2023-11-12 ENCOUNTER — Ambulatory Visit: Payer: Self-pay

## 2023-11-13 ENCOUNTER — Institutional Professional Consult (permissible substitution): Admitting: Cardiology

## 2023-11-15 ENCOUNTER — Ambulatory Visit: Attending: Cardiology | Admitting: Cardiology

## 2023-11-15 VITALS — BP 101/66 | HR 86 | Ht 71.0 in | Wt 226.0 lb

## 2023-11-15 DIAGNOSIS — Z79899 Other long term (current) drug therapy: Secondary | ICD-10-CM | POA: Diagnosis not present

## 2023-11-15 DIAGNOSIS — Z01812 Encounter for preprocedural laboratory examination: Secondary | ICD-10-CM | POA: Diagnosis not present

## 2023-11-15 DIAGNOSIS — I4819 Other persistent atrial fibrillation: Secondary | ICD-10-CM

## 2023-11-15 DIAGNOSIS — I5042 Chronic combined systolic (congestive) and diastolic (congestive) heart failure: Secondary | ICD-10-CM

## 2023-11-15 MED ORDER — AMIODARONE HCL 200 MG PO TABS: ORAL_TABLET | ORAL | 3 refills | Status: DC

## 2023-11-15 NOTE — Patient Instructions (Signed)
 Medication Instructions:  Your physician has recommended you make the following change in your medication:  On May 30th, start Amiodarone  Take 400mg  twice daily (800mg  total) for the first 5 days. Take 400mg  daily for 5 days. Take 200mg  daily there after.  Lab Work: CMP, TSH. FreeT4-- in 6 weeks  You may go to any [facility] Location for your lab work:  [facility] - [address] ([facility]) - [address] [phone] [address]  [place] - [address]   [place]  - [address]   [place] - [address] - [address] [employer]  - [address] [place] [address] ([facility]   If you have labs (blood work) drawn today and your tests are completely normal, you will receive your results only by: [employer] (if you have MyChart)  If you have any lab test that is abnormal or we need to change your treatment, we will call you or send a MyChart message to review the results.  Testing/Procedures: None ordered.  Follow-Up: At [facility], you and your health needs are our priority.  As part of our continuing mission to provide you with exceptional heart care, we have created designated Provider Care Teams.  These Care Teams include your primary Cardiologist (physician) and Advanced Practice Providers (APPs -  Physician Assistants and Nurse Practitioners) who all work together to provide you with the care you need, when you need it.  Your next appointment:   3 months  The format for your next appointment:   In Person  Provider:    Advanced Practice Providers on your designated Care Team:   Aaron Cole, [place] Aaron Cole, [place] Aaron Balk, NP  Note: Remote monitoring is used to monitor your Pacemaker/ ICD from home. This monitoring reduces the number of office visits required to check your device to one time per year. It allows us  to keep an  eye on the functioning of your device to ensure it is working properly.         Dear Aaron Cole  You are scheduled for a Cardioversion on Wednesday, July 16 with Dr. Micael Adas.  Please arrive at the [facility] (Main Entrance A) at [facility]: [address] [place], [place] [address] at 7:30 AM (This time is 1 hour(s) before your procedure to ensure your preparation).   Free valet parking service is available. You will check in at ADMITTING.   *Please Note: You will receive a call the day before your procedure to confirm the appointment time. That time may have changed from the original time based on the schedule for that day.*   DIET:  Nothing to eat or drink after midnight except a sip of water  with medications (see medication instructions below)   Continue taking your anticoagulant (blood thinner): Apixaban  (Eliquis ).  You will need to continue this after your procedure until you are told by your provider that it is safe to stop.    LABS: bmet,cbc to be done Wed 7/9   FYI:  For your safety, and to allow us  to monitor your vital signs accurately during the surgery/procedure we request: If you have artificial nails, gel coating, SNS etc, please have those removed prior to your surgery/procedure. Not having the nail coverings /polish removed may result in cancellation or delay of your surgery/procedure.  Your support person  will be asked to wait in the waiting room during your procedure.  It is OK to have someone drop you off and come back when you are ready to be discharged.  You cannot drive after the procedure and will need someone to drive you home.  Bring your insurance cards.  *Special Note: Every effort is made to have your procedure done on time. Occasionally there are emergencies that occur at the hospital that may cause delays. Please be patient if a delay does occur.

## 2023-11-15 NOTE — Progress Notes (Signed)
 Electrophysiology Office Follow up Visit Note:    Date:  11/15/2023   ID:  Aaron Cole, DOB 03-Nov-1950, MRN [id number]  PCP:  Aaron Crick, MD  [facility] Cardiologist:  Aaron Albany, MD (Inactive)  [facility] Electrophysiologist:  Aaron Byes, MD    Interval History:     Aaron Cole is a 73 y.o. male who presents for a follow up visit.   I last saw the patient in May 2024.  This appointment was for heart failure, PVCs.  He has a history of aortic valve replacement.  The patient then saw Aaron Cole in September 2024.  His PVCs had improved on repeat monitoring down to 3% from a peak of 20%.  He was intolerant of beta-blocker.  Since I last saw him he was diagnosed with renal cell carcinoma and underwent resection.  The patient last saw Aaron Cole in April.  He was diagnosed with a recurrence of atrial fibrillation after the time of his cancer resection.  This was accompanied by shortness of breath with exertion.  Aaron Cole  was started in addition to metoprolol .  The patient had a cardioversion October 02, 2023.  EF was down to 25 to 30%.  At the appointment with Aaron Cole on April 24 he was back in atrial fibrillation with a ventricular rate of 101 bpm.  Today he reports fatigue while in atrial fibrillation.  He tells me that "cannot live like this".      Past medical, surgical, social and family history were reviewed.  ROS:   Please see the history of present illness.    All other systems reviewed and are negative.  EKGs/Labs/Other Studies Reviewed:    The following studies were reviewed today:  October 24, 2023 EKG shows atrial fibrillation with a ventricular rate of 101 bpm.  QTc is approximately 500 ms.  September 26, 2023 blood work shows a creatinine of 1.4       Physical Exam:    VS:  BP 101/66   Pulse 86   Ht 5\' 11"  (1.803 m)   Wt 226 lb (102.5 kg)   SpO2 97%   BMI 31.52 kg/m     Wt Readings from Last 3 Encounters:  11/15/23 226 lb (102.5 kg)   10/24/23 225 lb 8 oz (102.3 kg)  10/02/23 229 lb (103.9 kg)     GEN: no distress CARD: Irregularly irregular, No MRG RESP: No IWOB. CTAB.      ASSESSMENT:    1. Persistent atrial fibrillation (HCC)   2. Chronic combined systolic and diastolic heart failure (HCC)    PLAN:    In order of problems listed above:  #Persistent atrial fibrillation Unfortunately he has significant atrial fibrillation that is symptomatic and associated with reduced ejection fraction.  Our treatment options are limited given his medical comorbidities.  His QTc and creatinine prevents safe usage of Tikosyn.  I think his best option is amiodarone  despite his history of liver dysfunction.  Will need to monitor this closely as we start the medication.  I discussed the risks of amiodarone  in detail the patient and he wishes to proceed.  He did miss a dose of blood thinner 2 weeks ago so we will need to delay starting the amiodarone  for 2 more weeks.  On May 30, plan to start 400 mg by mouth twice daily for 5 days followed by 400 mg by mouth daily for 5 days followed by 200 mg by mouth daily thereafter.  He will need a CMP,  TSH and free T4 in 6 to 8 weeks.    Plan for cardioversion in 8 weeks after he has completed the amiodarone  load.  The risks of cardioversion were discussed in detail the patient and he wishes to proceed.  Follow-up with EP APP in 3 months  #Chronic systolic heart failure NYHA class III.  Warm and dry on exam. Continue Entresto , metoprolol   Follow-up 3 months with the EP APP.   Signed, Aaron Liner, MD, [facility], [facility] 11/15/2023 3:02 PM    Electrophysiology [facility]

## 2023-11-18 DIAGNOSIS — C7971 Secondary malignant neoplasm of right adrenal gland: Secondary | ICD-10-CM | POA: Diagnosis not present

## 2023-11-18 DIAGNOSIS — C641 Malignant neoplasm of right kidney, except renal pelvis: Secondary | ICD-10-CM | POA: Diagnosis not present

## 2023-11-18 DIAGNOSIS — C649 Malignant neoplasm of unspecified kidney, except renal pelvis: Secondary | ICD-10-CM | POA: Diagnosis not present

## 2023-11-21 ENCOUNTER — Encounter: Payer: Self-pay | Admitting: Sleep Medicine

## 2023-11-21 ENCOUNTER — Ambulatory Visit (INDEPENDENT_AMBULATORY_CARE_PROVIDER_SITE_OTHER): Admitting: Sleep Medicine

## 2023-11-21 VITALS — BP 118/62 | HR 50 | Temp 96.9°F | Ht 71.0 in | Wt 225.4 lb

## 2023-11-21 DIAGNOSIS — I4891 Unspecified atrial fibrillation: Secondary | ICD-10-CM

## 2023-11-21 DIAGNOSIS — G4733 Obstructive sleep apnea (adult) (pediatric): Secondary | ICD-10-CM | POA: Diagnosis not present

## 2023-11-21 NOTE — Progress Notes (Signed)
 Name:Aaron Cole MRN: [id number] DOB: [birth year]   CHIEF COMPLAINT:  HST F/U   HISTORY OF PRESENT ILLNESS:  Aaron Cole is a 73 y.o. w/ a h/o HTN, CAD, atrial fibrillation and obesity who presents to follow up on HST results. The patient underwent HST which revealed severe OSA (AHI 47, O2 nadir 82%).    EPWORTH SLEEP SCORE    08/26/2023    1:16 PM  Results of the Epworth flowsheet  Sitting and reading 0  Watching TV 2  Sitting, inactive in a public place (e.g. a theatre or a meeting) 0  As a passenger in a car for an hour without a break 0  Lying down to rest in the afternoon when circumstances permit 2  Sitting and talking to someone 0  Sitting quietly after a lunch without alcohol 2  In a car, while stopped for a few minutes in traffic 0  Total score 6     PAST MEDICAL HISTORY :   has a past medical history of CAD (coronary artery disease) (2013), CHF (congestive heart failure) (HCC), Cirrhosis of liver (HCC) (01/10/2019), CKD (chronic kidney disease) stage 3, GFR 30-59 ml/min (HCC) (2015), Diverticulosis, Dysrhythmia, Hepatitis C virus infection without hepatic coma (03/02/2019), History of hepatitis B virus infection, HTN (hypertension), Renal cell carcinoma (HCC) (2013), S/p nephrectomy (2013), and Sinus headache.  has a past surgical history that includes Nasal sinus surgery (1978); rectal fistula repair (1980s); Nephrectomy (Right, 12/2011); Colonoscopy (11/2008); Bentall procedure (N/A, 07/05/2015); TEE without cardioversion (N/A, 07/05/2015); Coronary angioplasty; Colonoscopy with propofol  (N/A, 02/24/2019); Hydrocele surgery (Right, 02/05/2020); Esophagogastroduodenoscopy (egd) with propofol  (N/A, 08/04/2020); Cardiac valve replacement; and Cardioversion (N/A, 10/02/2023). Prior to Admission medications   Medication Sig Start Date End Date Taking? Authorizing Provider  amiodarone  (PACERONE ) 200 MG tablet Take 400mg  twice daily (800mg  total) for the first 5 days.  Take 400mg  daily for 5 days. Take 200mg  daily there after. 11/15/23  Yes Boyce Byes, MD  apixaban  (ELIQUIS ) 5 MG TABS tablet Take 1 tablet (5 mg total) by mouth 2 (two) times daily. 08/21/23  Yes Furth, Cadence H, PA-C  atorvastatin (LIPITOR) 20 MG tablet Take 1 tablet by mouth daily. 10/01/23 11/04/24 Yes [provider]  fluticasone  (FLONASE ) 50 MCG/ACT nasal spray INHALE 2 SPRAYS INTO EACH NOSTRIL EVERY DAY. Patient taking differently: Place 2 sprays into both nostrils daily as needed for allergies. 06/27/16  Yes Claire Crick, MD  metoprolol  succinate (TOPROL  XL) 50 MG 24 hr tablet Take 1 tablet (50 mg total) by mouth daily. 10/24/23  Yes Furth, Cadence H, PA-C  sacubitril -valsartan  (ENTRESTO ) 24-26 MG Take 1 tablet by mouth 2 (two) times daily. 08/21/23  Yes Furth, Cadence H, PA-C   Allergies  Allergen Reactions   Carvedilol Other (See Comments)    Blurred vision, drowsiness    FAMILY HISTORY:  family history includes CAD in his maternal grandfather; Cancer in his paternal aunt, paternal aunt, and paternal grandmother; Colon polyps in his brother and father; Coronary artery disease in his maternal uncle; Crohn's disease in his father; Diabetes in his father; Heart disease in his father and mother; Hypertension in his mother; Obesity in his mother. SOCIAL HISTORY:  reports that he has never smoked. He has never been exposed to tobacco smoke. He has never used smokeless tobacco. He reports that he does not currently use alcohol. He reports that he does not use drugs.   Review of Systems:  Gen:  Denies  fever, sweats, chills  weight loss  HEENT: Denies blurred vision, double vision, ear pain, eye pain, hearing loss, nose bleeds, sore throat Cardiac:  No dizziness, chest pain or heaviness, chest tightness,edema, No JVD Resp:   No cough, -sputum production, -shortness of breath,-wheezing, -hemoptysis,  Gi: Denies swallowing difficulty, stomach pain, nausea or vomiting, diarrhea,  constipation, bowel incontinence Gu:  Denies bladder incontinence, burning urine Ext:   Denies Joint pain, stiffness or swelling Skin: Denies  skin rash, easy bruising or bleeding or hives Endoc:  Denies polyuria, polydipsia , polyphagia or weight change Psych:   Denies depression, insomnia or hallucinations  Other:  All other systems negative  VITAL SIGNS: BP 118/62 (BP Location: Right Arm, Cuff Size: Large)   Pulse (!) 50   Temp (!) 96.9 F (36.1 C)   Ht 5\' 11"  (1.803 m)   Wt 225 lb 6.4 oz (102.2 kg)   SpO2 96%   BMI 31.44 kg/m    Physical Examination:   General Appearance: No distress  EYES PERRLA, EOM intact.   NECK Supple, No JVD Pulmonary: normal breath sounds, No wheezing.  CardiovascularNormal S1,S2.  No m/r/g.   Abdomen: Benign, Soft, non-tender. Skin:   warm, no rashes, no ecchymosis  Extremities: normal, no cyanosis, clubbing. Neuro:without focal findings,  speech normal  PSYCHIATRIC: Mood, affect within normal limits.   ASSESSMENT AND PLAN  OSA Reviewed HST results with patient. Starting on APAP therapy set to 4-16 cm H2O. Discussed the consequences of untreated sleep apnea. Advised not to drive drowsy for safety of patient and others. Will follow up in 3 months.    Atrial fibrillation Stable, on current management. Following with cardiology.    Patient  satisfied with Plan of action and management. All questions answered  I spent a total of 30 minutes reviewing chart data, face-to-face evaluation with the patient, counseling and coordination of care as detailed above.    Melainie Krinsky, M.D.  Sleep Medicine [facility]

## 2023-11-21 NOTE — Patient Instructions (Signed)

## 2023-11-23 ENCOUNTER — Encounter: Payer: Self-pay | Admitting: Family Medicine

## 2023-11-23 DIAGNOSIS — G4733 Obstructive sleep apnea (adult) (pediatric): Secondary | ICD-10-CM | POA: Insufficient documentation

## 2023-11-28 DIAGNOSIS — H902 Conductive hearing loss, unspecified: Secondary | ICD-10-CM | POA: Diagnosis not present

## 2023-11-28 DIAGNOSIS — H6123 Impacted cerumen, bilateral: Secondary | ICD-10-CM | POA: Diagnosis not present

## 2023-12-26 DIAGNOSIS — N1831 Chronic kidney disease, stage 3a: Secondary | ICD-10-CM | POA: Diagnosis not present

## 2023-12-26 DIAGNOSIS — C649 Malignant neoplasm of unspecified kidney, except renal pelvis: Secondary | ICD-10-CM | POA: Diagnosis not present

## 2023-12-26 DIAGNOSIS — I10 Essential (primary) hypertension: Secondary | ICD-10-CM | POA: Diagnosis not present

## 2024-01-02 DIAGNOSIS — I10 Essential (primary) hypertension: Secondary | ICD-10-CM | POA: Diagnosis not present

## 2024-01-02 DIAGNOSIS — N1831 Chronic kidney disease, stage 3a: Secondary | ICD-10-CM | POA: Diagnosis not present

## 2024-01-08 DIAGNOSIS — I4819 Other persistent atrial fibrillation: Secondary | ICD-10-CM | POA: Diagnosis not present

## 2024-01-08 DIAGNOSIS — Z01812 Encounter for preprocedural laboratory examination: Secondary | ICD-10-CM | POA: Diagnosis not present

## 2024-01-09 ENCOUNTER — Ambulatory Visit: Payer: Self-pay

## 2024-01-09 LAB — CBC WITH DIFFERENTIAL/PLATELET
Basophils Absolute: 0 x10E3/uL (ref 0.0–0.2)
Basos: 1 %
EOS (ABSOLUTE): 0.2 x10E3/uL (ref 0.0–0.4)
Eos: 3 %
Hematocrit: 42.5 % (ref 37.5–51.0)
Hemoglobin: 13.4 g/dL (ref 13.0–17.7)
Immature Grans (Abs): 0 x10E3/uL (ref 0.0–0.1)
Immature Granulocytes: 0 %
Lymphocytes Absolute: 2 x10E3/uL (ref 0.7–3.1)
Lymphs: 28 %
MCH: 28.9 pg (ref 26.6–33.0)
MCHC: 31.5 g/dL (ref 31.5–35.7)
MCV: 92 fL (ref 79–97)
Monocytes Absolute: 0.5 x10E3/uL (ref 0.1–0.9)
Monocytes: 6 %
Neutrophils Absolute: 4.5 x10E3/uL (ref 1.4–7.0)
Neutrophils: 62 %
Platelets: 156 x10E3/uL (ref 150–450)
RBC: 4.63 x10E6/uL (ref 4.14–5.80)
RDW: 14.7 % (ref 11.6–15.4)
WBC: 7.2 x10E3/uL (ref 3.4–10.8)

## 2024-01-09 LAB — BASIC METABOLIC PANEL WITH GFR
BUN/Creatinine Ratio: 12 (ref 10–24)
BUN: 19 mg/dL (ref 8–27)
CO2: 19 mmol/L — ABNORMAL LOW (ref 20–29)
Calcium: 8.8 mg/dL (ref 8.6–10.2)
Chloride: 104 mmol/L (ref 96–106)
Creatinine, Ser: 1.59 mg/dL — AB (ref 0.76–1.27)
Glucose: 107 mg/dL — AB (ref 70–99)
Potassium: 4.7 mmol/L (ref 3.5–5.2)
Sodium: 138 mmol/L (ref 134–144)
eGFR: 46 mL/min/1.73 — AB (ref >59)

## 2024-01-14 NOTE — Progress Notes (Signed)
 Spoke to patient and instructed them to come at 0730  and to be NPO after 0000.     Confirmed that patient will have a ride home and someone to stay with them for 24 hours after the procedure.  Confirmed blood thinner. Confirmed no breaks in taking blood thinner for 3+ weeks prior to procedure. Confirmed patient stopped all GLP-1s and GLP-2s for at least one week before procedure.

## 2024-01-15 ENCOUNTER — Encounter (HOSPITAL_COMMUNITY): Payer: Self-pay | Admitting: Cardiology

## 2024-01-15 ENCOUNTER — Ambulatory Visit (HOSPITAL_COMMUNITY)
Admission: RE | Admit: 2024-01-15 | Discharge: 2024-01-15 | Disposition: A | Discharge status: Home / Self Care | Attending: Cardiology | Admitting: Cardiology

## 2024-01-15 ENCOUNTER — Encounter (HOSPITAL_COMMUNITY)
Admission: RE | Disposition: A | Discharge status: Home / Self Care | Payer: Self-pay | Source: Home / Self Care | Attending: Cardiology

## 2024-01-15 ENCOUNTER — Ambulatory Visit (HOSPITAL_COMMUNITY): Admitting: Anesthesiology

## 2024-01-15 ENCOUNTER — Other Ambulatory Visit: Payer: Self-pay

## 2024-01-15 DIAGNOSIS — I4819 Other persistent atrial fibrillation: Secondary | ICD-10-CM | POA: Diagnosis not present

## 2024-01-15 DIAGNOSIS — I2510 Atherosclerotic heart disease of native coronary artery without angina pectoris: Secondary | ICD-10-CM | POA: Diagnosis not present

## 2024-01-15 DIAGNOSIS — G4730 Sleep apnea, unspecified: Secondary | ICD-10-CM | POA: Diagnosis not present

## 2024-01-15 DIAGNOSIS — Z7901 Long term (current) use of anticoagulants: Secondary | ICD-10-CM | POA: Diagnosis not present

## 2024-01-15 DIAGNOSIS — I4891 Unspecified atrial fibrillation: Secondary | ICD-10-CM

## 2024-01-15 DIAGNOSIS — I129 Hypertensive chronic kidney disease with stage 1 through stage 4 chronic kidney disease, or unspecified chronic kidney disease: Secondary | ICD-10-CM | POA: Diagnosis not present

## 2024-01-15 DIAGNOSIS — I509 Heart failure, unspecified: Secondary | ICD-10-CM | POA: Insufficient documentation

## 2024-01-15 DIAGNOSIS — Z79899 Other long term (current) drug therapy: Secondary | ICD-10-CM | POA: Diagnosis not present

## 2024-01-15 DIAGNOSIS — N1831 Chronic kidney disease, stage 3a: Secondary | ICD-10-CM | POA: Diagnosis not present

## 2024-01-15 DIAGNOSIS — I110 Hypertensive heart disease with heart failure: Secondary | ICD-10-CM | POA: Diagnosis not present

## 2024-01-15 HISTORY — PX: CARDIOVERSION: EP1203

## 2024-01-15 SURGERY — CARDIOVERSION (CATH LAB)
Anesthesia: General

## 2024-01-15 MED ORDER — LIDOCAINE 2% (20 MG/ML) 5 ML SYRINGE
INTRAMUSCULAR | Status: DC | PRN
Start: 2024-01-15 — End: 2024-01-15
  Administered 2024-01-15: 100 mg via INTRAVENOUS

## 2024-01-15 MED ORDER — FLUTICASONE PROPIONATE 50 MCG/ACT NA SUSP: 2.0000 | Freq: Every day | NASAL | Status: AC | PRN

## 2024-01-15 MED ORDER — AMIODARONE HCL 200 MG PO TABS: 200.0000 mg | ORAL_TABLET | Freq: Every day | ORAL | Status: AC

## 2024-01-15 MED ORDER — PROPOFOL 10 MG/ML IV BOLUS
INTRAVENOUS | Status: DC | PRN
  Administered 2024-01-15: 40 mg via INTRAVENOUS

## 2024-01-15 SURGICAL SUPPLY — 1 items: PAD DEFIB RADIO PHYSIO CONN (PAD) ×2 IMPLANT

## 2024-01-15 NOTE — Anesthesia Preprocedure Evaluation (Addendum)
 Anesthesia Evaluation  Patient identified by MRN, date of birth, ID band Patient awake    Reviewed: Allergy & Precautions, NPO status , Patient's Chart, lab work & pertinent test results, reviewed documented beta blocker date and time   History of Anesthesia Complications Negative for: history of anesthetic complications  Airway Mallampati: II  TM Distance: >3 FB     Dental no notable dental hx.    Pulmonary sleep apnea    breath sounds clear to auscultation       Cardiovascular hypertension, + CAD and +CHF  + dysrhythmias Atrial Fibrillation  Rhythm:Regular Rate:Normal  IMPRESSIONS     1. Left ventricular ejection fraction, by estimation, is 25 to 30%. The  left ventricle has severely decreased function. The left ventricle  demonstrates global hypokinesis. Left ventricular diastolic parameters are  indeterminate.   2. Right ventricular systolic function is normal. The right ventricular  size is normal.   3. Left atrial size was severely dilated.   4. The mitral valve is normal in structure. Mild to moderate mitral valve  regurgitation. No evidence of mitral stenosis. Moderate mitral annular  calcification.   5. The aortic valve has been repaired/replaced. Aortic valve  regurgitation is not visualized. No aortic stenosis is present.   6. The inferior vena cava is normal in size with greater than 50%  respiratory variability, suggesting right atrial pressure of 3 mmHg.      Neuro/Psych  Headaches    GI/Hepatic ,,,(+) Hepatitis -, C  Endo/Other    Renal/GU CRFRenal disease     Musculoskeletal   Abdominal   Peds  Hematology   Anesthesia Other Findings   Reproductive/Obstetrics                              Anesthesia Physical Anesthesia Plan  ASA: 4  Anesthesia Plan: General   Post-op Pain Management:    Induction: Intravenous  PONV Risk Score and Plan: 2 and  Ondansetron   Airway Management Planned: Natural Airway and Nasal Cannula  Additional Equipment:   Intra-op Plan:   Post-operative Plan:   Informed Consent: I have reviewed the patients History and Physical, chart, labs and discussed the procedure including the risks, benefits and alternatives for the proposed anesthesia with the patient or authorized representative who has indicated his/her understanding and acceptance.     Dental advisory given  Plan Discussed with: CRNA  Anesthesia Plan Comments:          Anesthesia Quick Evaluation

## 2024-01-15 NOTE — H&P (Addendum)
  Date:  11/15/2023    ID:  Aaron Cole, DOB [birth year], MRN [id number]   PCP:  Aaron Baller, MD    [facility] Cardiologist:  Aaron LELON Claudene DOUGLAS, MD (Inactive)  [facility] Electrophysiologist:  Aaron ONEIDA HOLTS, MD      Interval History:       Aaron Cole is a 73 y.o. male with a hx of PVCs, CHF, AVR. He was seen in Sept 2024 and his PVCs had improved on repeat monitoring down to 3% from a peak of 20%.  He was intolerant of beta-blocker.  In [employer] 2025 He was diagnosed with a recurrence of atrial fibrillation after the time of his cancer resection.  This was accompanied by shortness of breath with exertion.  Eliquis  was started in addition to metoprolol .  The patient had a cardioversion October 02, 2023.  EF was down to 25 to 30%.  At the appointment on April 24 he was back in atrial fibrillation with a ventricular rate of 101 bpm. He was very symptomatic with his afib.    He had missed a dose of blood thinner 2 weeks prior to his appt with Dr. HOLTS on 11/15/2023 so plan was to start Amio 2 weeks later and then plan DCCV in 8 weeks after Amio load started.  He is now here for DCCV  Objective Past medical, surgical, social and family history were reviewed.   ROS:   Please see the history of present illness.    All other systems reviewed and are negative.   EKGs/Labs/Other Studies Reviewed:         Physical Exam:     VS:  BP 101/66   Pulse 86   Ht 5' 11 (1.803 m)   Wt 226 lb (102.5 kg)   SpO2 97%   BMI 31.52 kg/m         Wt Readings from Last 3 Encounters:  11/15/23 226 lb (102.5 kg)  10/24/23 225 lb 8 oz (102.3 kg)  10/02/23 229 lb (103.9 kg)     GEN: Well nourished, well developed in no acute distress HEENT: Normal NECK: No JVD; No carotid bruits LYMPHATICS: No lymphadenopathy CARDIAC:irregularly irregular, no murmurs, rubs, gallops RESPIRATORY:  Clear to auscultation without rales, wheezing or rhonchi  ABDOMEN: Soft, non-tender,  non-distended MUSCULOSKELETAL:  No edema; No deformity  SKIN: Warm and dry NEUROLOGIC:  Alert and oriented x 3 PSYCHIATRIC:  Normal affect   Assessment ASSESSMENT:     1. Persistent atrial fibrillation (HCC)     PLAN:     In order of problems listed above:   #Persistent atrial fibrillation -Unfortunately he has significant atrial fibrillation that is symptomatic and associated with reduced ejection fraction.  -s/p Amio load since end of May -he has not missed any doses of Eliquis  in the past 4 weeks -plan for DCCV today   Signed, Aaron Bihari, MD [facility] 01/15/2024

## 2024-01-15 NOTE — CV Procedure (Signed)
    Electrical Cardioversion Procedure Note Aaron Cole [id number] 02/26/1951  Procedure: Electrical Cardioversion Indications:  Atrial Fibrillation  Time Out: Verified patient identification, verified procedure,medications/allergies/relevent history reviewed, required imaging and test results available.  Performed  Procedure Details  During this procedure the patient is administered a total of Propofol  40 mg and Lidocaine  100 mg to achieve and maintain moderate conscious sedation.  The patient's heart rate, blood pressure, and oxygen saturation are monitored continuously during the procedure. The period of conscious sedation is 2 minutes, of which I was present face-to-face 100% of this time. Aaron Leisure, CRNA is an independent, trained observer who assisted in the monitoring of the patient's level of consciousness.     Cardioversion was done with synchronized biphasic defibrillation with AP pads with 200watts.  The patient converted to normal sinus rhythm. The patient tolerated the procedure well   IMPRESSION:  Successful cardioversion of atrial fibrillation    Aaron Cole 01/15/2024, 8:32 AM

## 2024-01-15 NOTE — Transfer of Care (Signed)
 Immediate Anesthesia Transfer of Care Note  Patient: Aaron Cole  Procedure(s) Performed: CARDIOVERSION  Patient Location: PACU and Cath Lab  Anesthesia Type:General  Level of Consciousness: awake  Airway & Oxygen Therapy: Patient connected to nasal cannula oxygen  Post-op Assessment: Report given to RN  Post vital signs: stable  Last Vitals:  Vitals Value Taken Time  BP 130/90 01/15/24 08:44  Temp    Pulse 52 01/15/24 08:44  Resp 19 01/15/24 08:44  SpO2 98 % 01/15/24 08:44    Last Pain:  Vitals:   01/15/24 0724  TempSrc: Temporal         Complications: No notable events documented.

## 2024-01-15 NOTE — Anesthesia Postprocedure Evaluation (Signed)
 Anesthesia Post Note  Patient: Aaron Cole  Procedure(s) Performed: CARDIOVERSION     Patient location during evaluation: PACU Anesthesia Type: General Level of consciousness: awake and alert Pain management: pain level controlled Vital Signs Assessment: post-procedure vital signs reviewed and stable Respiratory status: spontaneous breathing, nonlabored ventilation, respiratory function stable and patient connected to nasal cannula oxygen Cardiovascular status: blood pressure returned to baseline and stable Postop Assessment: no apparent nausea or vomiting Anesthetic complications: no   No notable events documented.  Last Vitals:  Vitals:   01/15/24 0910 01/15/24 0920  BP: (!) 145/89 (!) 143/89  Pulse: (!) 52 (!) 45  Resp: 13 14  Temp:  36.7 C  SpO2: 100% 100%    Last Pain:  Vitals:   01/15/24 0920  TempSrc: Temporal  PainSc: 0-No pain                 [name] Cornea

## 2024-01-28 ENCOUNTER — Ambulatory Visit: Attending: Medical | Admitting: Medical

## 2024-01-28 ENCOUNTER — Encounter: Payer: Self-pay | Admitting: Medical

## 2024-01-28 VITALS — BP 142/80 | HR 47 | Resp 18 | Ht 71.0 in | Wt 231.0 lb

## 2024-01-28 DIAGNOSIS — G4733 Obstructive sleep apnea (adult) (pediatric): Secondary | ICD-10-CM | POA: Diagnosis not present

## 2024-01-28 DIAGNOSIS — Z79899 Other long term (current) drug therapy: Secondary | ICD-10-CM | POA: Diagnosis not present

## 2024-01-28 DIAGNOSIS — I4819 Other persistent atrial fibrillation: Secondary | ICD-10-CM | POA: Insufficient documentation

## 2024-01-28 DIAGNOSIS — I493 Ventricular premature depolarization: Secondary | ICD-10-CM | POA: Insufficient documentation

## 2024-01-28 DIAGNOSIS — Z952 Presence of prosthetic heart valve: Secondary | ICD-10-CM | POA: Insufficient documentation

## 2024-01-28 DIAGNOSIS — I5020 Unspecified systolic (congestive) heart failure: Secondary | ICD-10-CM | POA: Insufficient documentation

## 2024-01-28 MED ORDER — DAPAGLIFLOZIN PROPANEDIOL 10 MG PO TABS
10.0000 mg | ORAL_TABLET | Freq: Every day | ORAL | 3 refills | Status: AC
Start: 2024-01-28 — End: ?

## 2024-01-28 MED ORDER — METOPROLOL SUCCINATE ER 25 MG PO TB24: 25.0000 mg | ORAL_TABLET | Freq: Every day | ORAL | 3 refills | Status: DC

## 2024-01-28 MED ORDER — SPIRONOLACTONE 25 MG PO TABS: 12.5000 mg | ORAL_TABLET | Freq: Every day | ORAL | 3 refills | Status: AC

## 2024-01-28 NOTE — Progress Notes (Signed)
 Cardiology Office Note   Date:  01/28/2024  ID:  NOX TALENT, DOB 03/16/1951, MRN [id number] PCP: Rilla Baller, MD  [facility] Providers Cardiologist:  Victory LELON Claudene DOUGLAS, MD (Inactive) Electrophysiologist:  OLE ONEIDA HOLTS, MD   History of Present Illness Aaron Cole is a 73 y.o. 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   The patient has h/o PVCs followed by EP. EF as low as 40-45% on echo in 2024.  He did not tolerate metoprolol . Reported improvement in PVC burden with Entresto . Not a good candidate for many AA due to structural heart disease, no amio d/t cirrhosis.  Repeat echo in December 2024 showed EF 45 to 50%. He was referred to general cards for GDMT titration of heart failure meds.    The patient was seen 08/2023 reporting exertional SOB. He had recent surgery and EKG showed Afib Hr 97bpm. He was started on Eliquis . Entresto  was decreased and Toprol  was started. He was referred to pulmonology for sleep study. He remained in Afib and ultimately underwent cardioversion 10/02/23. Repeat echo showed LVEF 25-30%, mild to mod MR.   Patient was seen in follow-up 10/24/2023 and was back in A-fib with a heart rate of 101 bpm.  Patient was referred to EP.  Patient was started on amiodarone  with plan for cardioversion.  He underwent successful cardioversion 01/15/2024.  Today, the patient is in SB with HR 45bpm. He is overall feeling better, but still has low energy. BP is a little high today, which he says is abnormal. He denies chest pain or SOB. He is undergoing work-up to see if he has recurrent cancer.    Studies Reviewed EKG Interpretation Date/Time:  Tuesday January 28 2024 14:06:15 EDT Ventricular Rate:  45 PR Interval:    QRS Duration:  134 QT Interval:  522 QTC Calculation: 451 R Axis:   -39  Text Interpretation: Marked  sinus bradycardia Left axis deviation Left ventricular hypertrophy with QRS widening ( R in aVL , Cornell product ) T wave abnormality, consider lateral ischemia When compared with ECG of 15-Jan-2024 08:43, Wide QRS rhythm has replaced Sinus rhythm Confirmed by Franchester, Maaliyah Adolph ([id number]) on 01/28/2024 2:10:27 PM    Echo 10/22/23  1. Left ventricular ejection fraction, by estimation, is 25 to 30%. The  left ventricle has severely decreased function. The left ventricle  demonstrates global hypokinesis. Left ventricular diastolic parameters are  indeterminate.   2. Right ventricular systolic function is normal. The right ventricular  size is normal.   3. Left atrial size was severely dilated.   4. The mitral valve is normal in structure. Mild to moderate mitral valve  regurgitation. No evidence of mitral stenosis. Moderate mitral annular  calcification.   5. The aortic valve has been repaired/replaced. Aortic valve  regurgitation is not visualized. No aortic stenosis is present.   6. The inferior vena cava is normal in size with greater than 50%  respiratory variability, suggesting right atrial pressure of 3 mmHg.   Comparison(s): EF has significantly decreased since prior exam 06/2023.      Long term monitor, 02/15/2023 HR 50 - 102 bpm, average 71 bpm. Rare supraventricular ectopy. Occasional ventricular ectopy, 3.3%. No sustained arrhythmias. No atrial fibrillation.   TTE, 07/20/2022  1. Left ventricular ejection fraction, by estimation, is 40 to 45%. The left ventricle has mildly decreased function. The  left ventricle has no regional wall motion abnormalities. Left ventricular diastolic parameters are consistent with Grade II diastolic dysfunction (pseudonormalization).   2. Right ventricular systolic function is normal. The right ventricular size is normal.   3. Left atrial size was moderately dilated.   4. The mitral valve is normal in structure. Mild mitral valve regurgitation. No evidence  of mitral stenosis.   5. The aortic valve is normal in structure. Aortic valve regurgitation is not visualized. Aortic valve sclerosis is present, with no evidence of aortic valve stenosis. There is a 25 mm Magna pericardial valve present in the aortic position.   6. There is borderline dilatation of the aortic root, measuring 38 mm.   7. The inferior vena cava is normal in size with greater than 50% respiratory variability, suggesting right atrial pressure of 3 mmHg.    Long term monitor, 04/23/2022   Basic rhythm is NSR with BBB/IVCD with average HR 75 bpm.   PVC burden 24% with episodes of ventricular bigeminy and trigeminy   One 4 beat nonsustained VT   TTE, 03/02/2021  1. Left ventricular endocardial border not optimally defined to evaluate regional wall motion. Overall LV EF appears to be around 50%. Left ventricular diastolic parameters are consistent with Grade I diastolic dysfunction (impaired relaxation).   2. Right ventricular systolic function is normal. The right ventricular size is moderately enlarged. There is normal pulmonary artery systolic pressure.   3. Left atrial size was mildly dilated.   4. Right atrial size was mildly dilated.   5. The mitral valve is normal in structure. Trivial mitral valve regurgitation. No evidence of mitral stenosis. Moderate mitral annular calcification.   6. The aortic valve has been repaired/replaced. There is a 25 mm Magna  pericardial valve present in the aortic position.  Aortic valve regurgitation is not visualized.   7. 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 S/P Bentall procedure. There is mild dilatation of the  aortic root, measuring 40 mm.   8. The inferior vena cava is normal in size with greater than 50% respiratory variability, suggesting right atrial pressure of 3 mmHg.   9. Recommend repeat limited study with definity to assess for wall motion abnormalities and get accurate assessment of EF        Physical Exam VS:  BP (!) 142/80 (BP Location: Left Arm, Patient Position: Sitting, Cuff Size: Normal)   Pulse (!) 47   Resp 18   Ht 5' 11 (1.803 m)   Wt 231 lb (104.8 kg)   SpO2 96%   BMI 32.22 kg/m        Wt Readings from Last 3 Encounters:  01/28/24 231 lb (104.8 kg)  01/15/24 229 lb (103.9 kg)  11/21/23 225 lb 6.4 oz (102.2 kg)    GEN: Well nourished, well developed in no acute distress NECK: No JVD; No carotid bruits CARDIAC: bradycardia, RR, no murmurs, rubs, gallops RESPIRATORY:  Clear to auscultation without rales, wheezing or rhonchi  ABDOMEN: Soft, non-tender, non-distended EXTREMITIES:  No edema; No deformity   ASSESSMENT AND PLAN  Persistent Afib S/p DCCV but converted back to Afib. He saw EP who started him on amiodarone  and underwent successful cardioversion 01/15/24. He is in SB with HR 45bpm. I will decrease Toprol  to 25mg  daily. Continue amiodarone  200mg  daily. Continue Eliquis  5mg  BID.   HFmrEF Echo 07/2022 showed LVEF 40-45%, suspected from PVC burden. Echo 06/2023 showed LVEF improved LVEF 45-50%. Repeat echo in the setting of Afib showed LVEF 25-30%.  The patient is back in NSR. He is taking Entresto  and Toprol . I will add on spiro 12.5mg  daily and Farxiga  10mg  daily. Repeat BMET in 2 weeks. Repeat echo in 4-6 weeks.   PVCs PVC burden improved with Entresto . He is now on amiodarone  . EKG with no PVCs today. Continue amiodarone  and Toprol .   S/p AVR Bentall procedure Normally functioning valve on most recent echo.   OSA Diagnosed with severe OSA, started on CPAP.         Dispo: Follow-up in 2 months  Signed, Clarrisa Kaylor VEAR Fishman, PA-C

## 2024-01-28 NOTE — Patient Instructions (Signed)
 Medication Instructions:  Your physician recommends the following medication changes.  START TAKING: Farxiga  10 mg by mouth daily Spirolactone 12.5 mg by mouth daily    DECREASE: Metoprolol  25 mg by mouth daily   *If you need a refill on your cardiac medications before your next appointment, please call your pharmacy*  Lab Work: Your provider would like for you to return in 2 weeks to have the following labs drawn: BMP.   Please go to [facility] [address] ([facility] [address], [place] [address] You do not need an appointment.  They are open from 8 am- 4:30 pm.  Lunch from 1:00 pm- 2:00 pm You will not need to be fasting.    Testing/Procedures: Your physician has requested that you have an echocardiogram in 4-6 weeks. Echocardiography is a painless test that uses sound waves to create images of your heart. It provides your doctor with information about the size and shape of your heart and how well your heart's chambers and valves are working.   You may receive an ultrasound enhancing agent through an IV if needed to better visualize your heart during the echo. This procedure takes approximately one hour.  There are no restrictions for this procedure.  This will take place at [address] [facility]) [address], [place] [address]  Please note: We ask at that you not bring children with you during ultrasound (echo/ vascular) testing. Due to room size and safety concerns, children are not allowed in the ultrasound rooms during exams. Our front office staff cannot provide observation of children in our lobby area while testing is being conducted. An adult accompanying a patient to their appointment will only be allowed in the ultrasound room at the discretion of the ultrasound technician under special circumstances. We apologize for any inconvenience.   Follow-Up: At [facility], you and your health needs are our priority.  As part of  our continuing mission to provide you with exceptional heart care, our providers are all part of one team.  This team includes your primary Cardiologist (physician) and Advanced Practice Providers or APPs (Physician Assistants and Nurse Practitioners) who all work together to provide you with the care you need, when you need it.  Your next appointment:   After ECHO  Provider:   Cadence Franchester, PA-C

## 2024-02-20 ENCOUNTER — Ambulatory Visit: Admitting: Sleep Medicine

## 2024-02-24 ENCOUNTER — Encounter: Payer: Self-pay | Admitting: Sleep Medicine

## 2024-02-24 ENCOUNTER — Ambulatory Visit (INDEPENDENT_AMBULATORY_CARE_PROVIDER_SITE_OTHER): Admitting: Sleep Medicine

## 2024-02-24 VITALS — BP 118/80 | HR 43 | Temp 98.1°F | Ht 71.0 in | Wt 218.8 lb

## 2024-02-24 DIAGNOSIS — G4733 Obstructive sleep apnea (adult) (pediatric): Secondary | ICD-10-CM | POA: Diagnosis not present

## 2024-02-24 DIAGNOSIS — I4891 Unspecified atrial fibrillation: Secondary | ICD-10-CM | POA: Diagnosis not present

## 2024-02-24 DIAGNOSIS — I10 Essential (primary) hypertension: Secondary | ICD-10-CM

## 2024-02-24 NOTE — Progress Notes (Unsigned)
 Name:Aaron Cole MRN: [id number] DOB: 01/14/1951   CHIEF COMPLAINT:  CPAP F/U   HISTORY OF PRESENT ILLNESS: Aaron Cole is a 73 y.o. w/ a h/o OSA, HTN, atrial fibrillation and obesity who presents for CPAP F/U visit. Reports using CPAP therapy every night, which is confirmed by compliance data. He is currently using the Airfit F20 FFM, which is uncomfortable. However, states that he does feel more refreshed upon awakening with CPAP therapy.    EPWORTH SLEEP SCORE    08/26/2023    1:16 PM  Results of the Epworth flowsheet  Sitting and reading 0  Watching TV 2  Sitting, inactive in a public place (e.g. a theatre or a meeting) 0  As a passenger in a car for an hour without a break 0  Lying down to rest in the afternoon when circumstances permit 2  Sitting and talking to someone 0  Sitting quietly after a lunch without alcohol 2  In a car, while stopped for a few minutes in traffic 0  Total score 6     PAST MEDICAL HISTORY :   has a past medical history of CAD (coronary artery disease) (2013), CHF (congestive heart failure) (HCC), Cirrhosis of liver (HCC) (01/10/2019), CKD (chronic kidney disease) stage 3, GFR 30-59 ml/min (HCC) (2015), Diverticulosis, Dysrhythmia, Hepatitis C virus infection without hepatic coma (03/02/2019), History of hepatitis B virus infection, HTN (hypertension), Renal cell carcinoma (HCC) (2013), S/p nephrectomy (2013), and Sinus headache.  has a past surgical history that includes Nasal sinus surgery (1978); rectal fistula repair (1980s); Nephrectomy (Right, 12/2011); Colonoscopy (11/2008); Bentall procedure (N/A, 07/05/2015); TEE without cardioversion (N/A, 07/05/2015); Coronary angioplasty; Colonoscopy with propofol  (N/A, 02/24/2019); Hydrocele surgery (Right, 02/05/2020); Esophagogastroduodenoscopy (egd) with propofol  (N/A, 08/04/2020); Cardiac valve replacement; Cardioversion (N/A, 10/02/2023); and CARDIOVERSION (N/A, 01/15/2024). Prior to Admission  medications   Medication Sig Start Date End Date Taking? Authorizing Provider  amiodarone  (PACERONE ) 200 MG tablet Take 1 tablet (200 mg total) by mouth daily. 01/15/24  Yes Turner, Wilbert SAUNDERS, MD  apixaban  (ELIQUIS ) 5 MG TABS tablet Take 1 tablet (5 mg total) by mouth 2 (two) times daily. 08/21/23  Yes Furth, Cadence H, PA-C  atorvastatin (LIPITOR) 20 MG tablet Take 20 mg by mouth daily. 10/01/23  Yes [provider]  dapagliflozin  propanediol (FARXIGA ) 10 MG TABS tablet Take 1 tablet (10 mg total) by mouth daily before breakfast. 01/28/24  Yes Furth, Cadence H, PA-C  fluticasone  (FLONASE ) 50 MCG/ACT nasal spray Place 2 sprays into both nostrils daily as needed for allergies. 01/15/24  Yes Turner, Wilbert SAUNDERS, MD  metoprolol  succinate (TOPROL -XL) 50 MG 24 hr tablet Take 50 mg by mouth daily. 01/31/24  Yes [provider]  sacubitril -valsartan  (ENTRESTO ) 24-26 MG Take 1 tablet by mouth 2 (two) times daily. 08/21/23  Yes Furth, Cadence H, PA-C  spironolactone  (ALDACTONE ) 25 MG tablet Take 0.5 tablets (12.5 mg total) by mouth daily. 01/28/24 04/27/24 Yes Furth, Cadence H, PA-C   Allergies  Allergen Reactions   Carvedilol Other (See Comments)    Blurred vision, drowsiness    FAMILY HISTORY:  family history includes CAD in his maternal grandfather; Cancer in his paternal aunt, paternal aunt, and paternal grandmother; Colon polyps in his brother and father; Coronary artery disease in his maternal uncle; Crohn's disease in his father; Diabetes in his father; Heart disease in his father and mother; Hypertension in his mother; Obesity in his mother. SOCIAL HISTORY:  reports that he has never smoked. He  has never been exposed to tobacco smoke. He has never used smokeless tobacco. He reports that he does not currently use alcohol. He reports that he does not use drugs.   Review of Systems:  Gen:  Denies  fever, sweats, chills weight loss  HEENT: Denies blurred vision, double vision, ear pain, eye  pain, hearing loss, nose bleeds, sore throat Cardiac:  No dizziness, chest pain or heaviness, chest tightness,edema, No JVD Resp:   No cough, -sputum production, -shortness of breath,-wheezing, -hemoptysis,  Gi: Denies swallowing difficulty, stomach pain, nausea or vomiting, diarrhea, constipation, bowel incontinence Gu:  Denies bladder incontinence, burning urine Ext:   Denies Joint pain, stiffness or swelling Skin: Denies  skin rash, easy bruising or bleeding or hives Endoc:  Denies polyuria, polydipsia , polyphagia or weight change Psych:   Denies depression, insomnia or hallucinations  Other:  All other systems negative  VITAL SIGNS: BP 118/80   Pulse (!) 43   Temp 98.1 F (36.7 C)   Ht 5' 11 (1.803 m)   Wt 218 lb 12.8 oz (99.2 kg)   SpO2 96%   BMI 30.52 kg/m    Physical Examination:   General Appearance: No distress  EYES PERRLA, EOM intact.   NECK Supple, No JVD Pulmonary: normal breath sounds, No wheezing.  CardiovascularNormal S1,S2.  No m/r/g.   Abdomen: Benign, Soft, non-tender. Skin:   warm, no rashes, no ecchymosis  Extremities: normal, no cyanosis, clubbing. Neuro:without focal findings,  speech normal  PSYCHIATRIC: Mood, affect within normal limits.   ASSESSMENT AND PLAN  OSA Patient is using and benefiting from CPAP therapy. Discussed the consequences of untreated sleep apnea. Advised not to drive drowsy for safety of patient and others. Will follow up in 3 months.   HTN Stable, on current management. Following with PCP.   Atrial fibrillation Stable, on current management.    Patient  satisfied with Plan of action and management. All questions answered  I spent a total of 42 minutes reviewing chart data, face-to-face evaluation with the patient, counseling and coordination of care as detailed above.    Emmily Pellegrin, M.D.  Sleep Medicine [facility]

## 2024-02-24 NOTE — Patient Instructions (Addendum)

## 2024-02-26 ENCOUNTER — Ambulatory Visit: Admitting: Sleep Medicine

## 2024-02-26 DIAGNOSIS — Z79899 Other long term (current) drug therapy: Secondary | ICD-10-CM | POA: Diagnosis not present

## 2024-02-27 ENCOUNTER — Ambulatory Visit: Payer: Self-pay

## 2024-02-27 LAB — BASIC METABOLIC PANEL WITH GFR
BUN/Creatinine Ratio: 14 (ref 10–24)
BUN: 22 mg/dL (ref 8–27)
CO2: 17 mmol/L — ABNORMAL LOW (ref 20–29)
Calcium: 9.3 mg/dL (ref 8.6–10.2)
Chloride: 106 mmol/L (ref 96–106)
Creatinine, Ser: 1.58 mg/dL — ABNORMAL HIGH (ref 0.76–1.27)
Glucose: 122 mg/dL — ABNORMAL HIGH (ref 70–99)
Potassium: 4.4 mmol/L (ref 3.5–5.2)
Sodium: 139 mmol/L (ref 134–144)
eGFR: 46 mL/min/1.73 — ABNORMAL LOW (ref >59)

## 2024-02-28 DIAGNOSIS — N3289 Other specified disorders of bladder: Secondary | ICD-10-CM | POA: Diagnosis not present

## 2024-02-28 DIAGNOSIS — C641 Malignant neoplasm of right kidney, except renal pelvis: Secondary | ICD-10-CM | POA: Diagnosis not present

## 2024-02-28 DIAGNOSIS — C7971 Secondary malignant neoplasm of right adrenal gland: Secondary | ICD-10-CM | POA: Diagnosis not present

## 2024-02-28 DIAGNOSIS — C649 Malignant neoplasm of unspecified kidney, except renal pelvis: Secondary | ICD-10-CM | POA: Diagnosis not present

## 2024-02-28 DIAGNOSIS — R918 Other nonspecific abnormal finding of lung field: Secondary | ICD-10-CM | POA: Diagnosis not present

## 2024-03-01 ENCOUNTER — Other Ambulatory Visit: Payer: Self-pay | Admitting: Medical

## 2024-03-03 NOTE — Telephone Encounter (Signed)
 Prescription refill request for Eliquis  received. Indication:afib Last office visit:7/25 Scr:1.58  8/25 Age: 73 Weight:99.2  kg  Prescription refilled

## 2024-03-06 DIAGNOSIS — C641 Malignant neoplasm of right kidney, except renal pelvis: Secondary | ICD-10-CM | POA: Diagnosis not present

## 2024-03-06 DIAGNOSIS — C649 Malignant neoplasm of unspecified kidney, except renal pelvis: Secondary | ICD-10-CM | POA: Diagnosis not present

## 2024-03-16 ENCOUNTER — Ambulatory Visit: Attending: Medical

## 2024-03-16 DIAGNOSIS — I5020 Unspecified systolic (congestive) heart failure: Secondary | ICD-10-CM | POA: Diagnosis not present

## 2024-03-16 LAB — ECHOCARDIOGRAM LIMITED
Area-P 1/2: 1.87 cm2
S' Lateral: 4.26 cm

## 2024-03-18 ENCOUNTER — Ambulatory Visit: Attending: Medical | Admitting: Medical

## 2024-03-18 ENCOUNTER — Encounter: Payer: Self-pay | Admitting: Medical

## 2024-03-18 VITALS — BP 116/80 | HR 59 | Ht 71.0 in | Wt 220.5 lb

## 2024-03-18 DIAGNOSIS — I5020 Unspecified systolic (congestive) heart failure: Secondary | ICD-10-CM | POA: Insufficient documentation

## 2024-03-18 DIAGNOSIS — Z79899 Other long term (current) drug therapy: Secondary | ICD-10-CM | POA: Insufficient documentation

## 2024-03-18 DIAGNOSIS — G4733 Obstructive sleep apnea (adult) (pediatric): Secondary | ICD-10-CM | POA: Diagnosis not present

## 2024-03-18 DIAGNOSIS — Z952 Presence of prosthetic heart valve: Secondary | ICD-10-CM | POA: Diagnosis not present

## 2024-03-18 DIAGNOSIS — I493 Ventricular premature depolarization: Secondary | ICD-10-CM | POA: Insufficient documentation

## 2024-03-18 DIAGNOSIS — I4819 Other persistent atrial fibrillation: Secondary | ICD-10-CM | POA: Insufficient documentation

## 2024-03-18 NOTE — Patient Instructions (Signed)
 Medication Instructions:  Your physician recommends that you continue on your current medications as directed. Please refer to the Current Medication list given to you today.   *If you need a refill on your cardiac medications before your next appointment, please call your pharmacy*  Lab Work: Your provider would like for you to return in 1-8 weeks to have the following labs drawn: Lipid panel.   Please go to [facility] [address] ([facility]) [address], [place] [address] You do not need an appointment.  They are open from 8 am- 4:30 pm.  Lunch from 1:00 pm- 2:00 pm You DO need to be fasting.   You may also go to one of the following [facility]:  [address] [place], [place] [address] Phone: [phone] Lab hours: Mon-Fri 8 am- 5 pm    Lunch 12 pm- 1 pm  [address] [place],  [place]  [address]  [place]  Phone: [phone] Lab hours: 7 am- 4 pm Lunch 12 pm-1 pm   [address] [place],  [place]  [address]  [place]  Phone: [phone] Lab hours: Mon-Fri 8 am- 5 pm    Lunch 12 pm- 1 pm  If you have labs (blood work) drawn today and your tests are completely normal, you will receive your results only by: MyChart Message (if you have MyChart) OR A paper copy in the mail If you have any lab test that is abnormal or we need to change your treatment, we will call you to review the results.  Testing/Procedures: None ordered at this time   Follow-Up: At [facility], you and your health needs are our priority.  As part of our continuing mission to provide you with exceptional heart care, our providers are all part of one team.  This team includes your primary Cardiologist (physician) and Advanced Practice Providers or APPs (Physician Assistants and Nurse Practitioners) who all work together to provide you with the care you need, when you need it.  Your next appointment:   6 month(s)  Provider:   You may see Cadence Franchester, PA-C

## 2024-03-18 NOTE — Progress Notes (Signed)
 Cardiology Office Note   Date:  03/18/2024  ID:  NICHOLAD KAUTZMAN, DOB 11-13-1950, MRN [id number] PCP: Rilla Baller, MD  [facility] Providers Cardiologist:  Victory LELON Claudene DOUGLAS, MD (Inactive) Electrophysiologist:  OLE ONEIDA HOLTS, MD   History of Present Illness Aaron Cole is a 73 y.o. male with a hx of PVC, HFrEF, s/p AVR with Bentall procedure January 2017, paroxysmal Afib, OSA on CPAP, h/o nephrectomy for renal cell carcinoma who is being seen for cardioversion follow-up.   The patient has h/o PVCs followed by EP. EF as low as 40-45% on echo in 2024.  He did not tolerate metoprolol . Reported improvement in PVC burden with Entresto . Not a good candidate for many AA due to structural heart disease, no amio d/t cirrhosis.  Repeat echo in December 2024 showed EF 45 to 50%. He was referred to general cards for GDMT titration of heart failure meds.    The patient was seen 08/2023 reporting exertional SOB. He had recent surgery and EKG showed Afib Hr 97bpm. He was started on Eliquis . Entresto  was decreased and Toprol  was started. He was referred to pulmonology for sleep study. He remained in Afib and ultimately underwent cardioversion 10/02/23. Repeat echo showed LVEF 25-30%, mild to mod MR.   Patient was seen in follow-up 10/24/2023 and was back in A-fib with a heart rate of 101 bpm.  Patient was referred to EP.  Patient was started on amiodarone  with plan for cardioversion.  He underwent successful cardioversion 01/15/2024.   The patient was last seen 01/28/24 and was in sinus bradycardia with heat rate of 45bpm and Toprol  was decreased. Spironolactone  and farxiga  were added.  Repeat echocardiogram showed improved pulm function with EF of 40 to 45%.  Today, the patient is feeling much better.  EKG shows sinus bradycardia with a heart rate of 55 bpm.  He is back to normal activity. He is wanting to get back to walking and biking. No chest pain, SOB, lower leg edema. He is tolerating  medications well.   Studies Reviewed EKG Interpretation Date/Time:  Wednesday March 18 2024 14:05:45 EDT Ventricular Rate:  55 PR Interval:  126 QRS Duration:  140 QT Interval:  480 QTC Calculation: 459 R Axis:   -51  Text Interpretation: Sinus bradycardia Left axis deviation Left ventricular hypertrophy with QRS widening and repolarization abnormality ( R in aVL , Cornell product ) When compared with ECG of 28-Jan-2024 14:06, Sinus rhythm has replaced Wide QRS rhythm Confirmed by Franchester, Mersedes Alber ([id number]) on 03/18/2024 2:07:23 PM    Limited echo 03/2024 1. Left ventricular ejection fraction, by estimation, is 40 to 45%. Left  ventricular ejection fraction by PLAX is 40 %. The left ventricle has  mildly decreased function. The left ventricle demonstrates global  hypokinesis. Left ventricular diastolic  parameters are consistent with Grade I diastolic dysfunction (impaired  relaxation). The average left ventricular global longitudinal strain is  -14.3 %. The global longitudinal strain is abnormal.   2. Right ventricular systolic function is normal. The right ventricular  size is normal.   3. The mitral valve is normal in structure. No evidence of mitral valve  regurgitation. No evidence of mitral stenosis.   4. The aortic valve is normal in structure. Aortic valve regurgitation is  not visualized. No aortic stenosis is present.   5. The inferior vena cava is normal in size with greater than 50%  respiratory variability, suggesting right atrial pressure of 3 mmHg.   Echo 10/22/23  1.  Left ventricular ejection fraction, by estimation, is 25 to 30%. The  left ventricle has severely decreased function. The left ventricle  demonstrates global hypokinesis. Left ventricular diastolic parameters are  indeterminate.   2. Right ventricular systolic function is normal. The right ventricular  size is normal.   3. Left atrial size was severely dilated.   4. The mitral valve is normal in  structure. Mild to moderate mitral valve  regurgitation. No evidence of mitral stenosis. Moderate mitral annular  calcification.   5. The aortic valve has been repaired/replaced. Aortic valve  regurgitation is not visualized. No aortic stenosis is present.   6. The inferior vena cava is normal in size with greater than 50%  respiratory variability, suggesting right atrial pressure of 3 mmHg.   Comparison(s): EF has significantly decreased since prior exam 06/2023.      Long term monitor, 02/15/2023 HR 50 - 102 bpm, average 71 bpm. Rare supraventricular ectopy. Occasional ventricular ectopy, 3.3%. No sustained arrhythmias. No atrial fibrillation.   TTE, 07/20/2022  1. Left ventricular ejection fraction, by estimation, is 40 to 45%. The left ventricle has mildly decreased function. The left ventricle has no regional wall motion abnormalities. Left ventricular diastolic parameters are consistent with Grade II diastolic dysfunction (pseudonormalization).   2. Right ventricular systolic function is normal. The right ventricular size is normal.   3. Left atrial size was moderately dilated.   4. The mitral valve is normal in structure. Mild mitral valve regurgitation. No evidence of mitral stenosis.   5. The aortic valve is normal in structure. Aortic valve regurgitation is not visualized. Aortic valve sclerosis is present, with no evidence of aortic valve stenosis. There is a 25 mm Magna pericardial valve present in the aortic position.   6. There is borderline dilatation of the aortic root, measuring 38 mm.   7. The inferior vena cava is normal in size with greater than 50% respiratory variability, suggesting right atrial pressure of 3 mmHg.    Long term monitor, 04/23/2022   Basic rhythm is NSR with BBB/IVCD with average HR 75 bpm.   PVC burden 24% with episodes of ventricular bigeminy and trigeminy   One 4 beat nonsustained VT   TTE, 03/02/2021  1. Left ventricular endocardial border not  optimally defined to evaluate regional wall motion. Overall LV EF appears to be around 50%. Left ventricular diastolic parameters are consistent with Grade I diastolic dysfunction (impaired relaxation).   2. Right ventricular systolic function is normal. The right ventricular size is moderately enlarged. There is normal pulmonary artery systolic pressure.   3. Left atrial size was mildly dilated.   4. Right atrial size was mildly dilated.   5. The mitral valve is normal in structure. Trivial mitral valve regurgitation. No evidence of mitral stenosis. Moderate mitral annular calcification.   6. The aortic valve has been repaired/replaced. There is a 25 mm Magna  pericardial valve present in the aortic position.  Aortic valve regurgitation is not visualized.   7. 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 S/P Bentall procedure. There is mild dilatation of the  aortic root, measuring 40 mm.   8. The inferior vena cava is normal in size with greater than 50% respiratory variability, suggesting right atrial pressure of 3 mmHg.   9. Recommend repeat limited study with definity to assess for wall motion abnormalities and get accurate assessment of EF       Physical Exam VS:  BP 116/80 (BP Location: Left Arm, Patient Position:  Sitting, Cuff Size: Normal)   Pulse (!) 59   Ht 5' 11 (1.803 m)   Wt 220 lb 8 oz (100 kg)   SpO2 95%   BMI 30.75 kg/m        Wt Readings from Last 3 Encounters:  03/18/24 220 lb 8 oz (100 kg)  02/24/24 218 lb 12.8 oz (99.2 kg)  01/28/24 231 lb (104.8 kg)    GEN: Well nourished, well developed in no acute distress NECK: No JVD; No carotid bruits CARDIAC: RRR, no murmurs, rubs, gallops RESPIRATORY:  Clear to auscultation without rales, wheezing or rhonchi  ABDOMEN: Soft, non-tender, non-distended EXTREMITIES:  No edema; No deformity   ASSESSMENT AND PLAN  Persistent Afib Patient's rates improved to the 50s.  Patient is overall feeling  much better.  He is back to normal function and plans to get into walking and biking.  Continue amiodarone  200mg  daily and toprol  25mg  daily  HFmrEF Patient has history of mildly reduced pulm function.  In the setting of A-fib he was found to have EF of 25 to 30% with mild to moderate MR.  Since then normal sinus rhythm has been established and he is on GDMT.  Repeat echocardiogram showed improved pulm function of 40 to 45%, grade 1 diastolic dysfunction.  Patient is euvolemic on exam.  Continue Farxiga  10 mg daily, Toprol  25mg  daily, Entresto  24-26 mg twice daily and spironolactone  12.5 mg daily.  PVCs PVC burden improved with Entresto .  Continue amiodarone  and Toprol .  S/ AVF Bentall procedure Normally functioning valve on echo  OSA Patient has been on CPAP for 3 months.     Dispo: Follow-up in 6 months  Signed, Orelia Brandstetter VEAR Fishman, PA-C

## 2024-04-08 ENCOUNTER — Encounter: Payer: Self-pay | Admitting: Family Medicine

## 2024-04-08 ENCOUNTER — Ambulatory Visit (INDEPENDENT_AMBULATORY_CARE_PROVIDER_SITE_OTHER)

## 2024-04-08 ENCOUNTER — Ambulatory Visit (INDEPENDENT_AMBULATORY_CARE_PROVIDER_SITE_OTHER): Admitting: Family Medicine

## 2024-04-08 VITALS — BP 118/78 | HR 62 | Temp 98.6°F | Ht 71.0 in | Wt 220.0 lb

## 2024-04-08 VITALS — BP 118/78 | HR 62 | Temp 98.6°F | Resp 20 | Ht 71.0 in | Wt 220.0 lb

## 2024-04-08 DIAGNOSIS — I4811 Longstanding persistent atrial fibrillation: Secondary | ICD-10-CM | POA: Diagnosis not present

## 2024-04-08 DIAGNOSIS — Z0000 Encounter for general adult medical examination without abnormal findings: Secondary | ICD-10-CM | POA: Diagnosis not present

## 2024-04-08 DIAGNOSIS — C7970 Secondary malignant neoplasm of unspecified adrenal gland: Secondary | ICD-10-CM

## 2024-04-08 DIAGNOSIS — Z85528 Personal history of other malignant neoplasm of kidney: Secondary | ICD-10-CM

## 2024-04-08 DIAGNOSIS — K7460 Unspecified cirrhosis of liver: Secondary | ICD-10-CM | POA: Diagnosis not present

## 2024-04-08 DIAGNOSIS — Z952 Presence of prosthetic heart valve: Secondary | ICD-10-CM

## 2024-04-08 DIAGNOSIS — E782 Mixed hyperlipidemia: Secondary | ICD-10-CM | POA: Diagnosis not present

## 2024-04-08 DIAGNOSIS — N529 Male erectile dysfunction, unspecified: Secondary | ICD-10-CM

## 2024-04-08 DIAGNOSIS — Z1211 Encounter for screening for malignant neoplasm of colon: Secondary | ICD-10-CM

## 2024-04-08 DIAGNOSIS — N1831 Chronic kidney disease, stage 3a: Secondary | ICD-10-CM

## 2024-04-08 DIAGNOSIS — Z905 Acquired absence of kidney: Secondary | ICD-10-CM

## 2024-04-08 DIAGNOSIS — G4733 Obstructive sleep apnea (adult) (pediatric): Secondary | ICD-10-CM

## 2024-04-08 DIAGNOSIS — Z7189 Other specified counseling: Secondary | ICD-10-CM

## 2024-04-08 DIAGNOSIS — E559 Vitamin D deficiency, unspecified: Secondary | ICD-10-CM

## 2024-04-08 DIAGNOSIS — Z23 Encounter for immunization: Secondary | ICD-10-CM

## 2024-04-08 DIAGNOSIS — D6869 Other thrombophilia: Secondary | ICD-10-CM

## 2024-04-08 DIAGNOSIS — Z8619 Personal history of other infectious and parasitic diseases: Secondary | ICD-10-CM

## 2024-04-08 DIAGNOSIS — B351 Tinea unguium: Secondary | ICD-10-CM | POA: Diagnosis not present

## 2024-04-08 DIAGNOSIS — I10 Essential (primary) hypertension: Secondary | ICD-10-CM

## 2024-04-08 DIAGNOSIS — C649 Malignant neoplasm of unspecified kidney, except renal pelvis: Secondary | ICD-10-CM | POA: Diagnosis not present

## 2024-04-08 DIAGNOSIS — Z8679 Personal history of other diseases of the circulatory system: Secondary | ICD-10-CM

## 2024-04-08 DIAGNOSIS — Z125 Encounter for screening for malignant neoplasm of prostate: Secondary | ICD-10-CM

## 2024-04-08 MED ORDER — SILDENAFIL CITRATE 100 MG PO TABS: 50.0000 mg | ORAL_TABLET | Freq: Every day | ORAL | 6 refills | Status: AC | PRN

## 2024-04-08 NOTE — Progress Notes (Addendum)
 Ph: [phone] Fax: [phone]   Patient ID: Aaron Cole, male    DOB: [birth year], 73 y.o.   MRN: [id number]  This visit was conducted in person.  BP 118/78   Pulse 62   Temp 98.6 F (37 C) (Oral)   Ht 5' 11 (1.803 m)   Wt 220 lb (99.8 kg)   BMI 30.68 kg/m    CC: AMW f/u visit  Subjective:   HPI: Aaron Cole is a 73 y.o. male presenting on 04/08/2024 for No chief complaint on file.   Saw health advisor earlier today for medicare wellness visit. Note reviewed.   No results found.  Flowsheet Row Clinical Support from 04/08/2024 in [facility]  PHQ-2 Total Score 0       04/07/2024   12:20 PM 04/08/2023    2:43 PM 02/05/2023    9:09 AM 04/04/2022    3:50 PM 01/22/2020    3:37 PM  Fall Risk   Falls in the past year? 0 1 0 0 0  Number falls in past yr:  1 0  0  Injury with Fall?  1 0  0  Risk for fall due to : No Fall Risks  No Fall Risks  Medication side effect  Follow up Education provided;Falls prevention discussed  Falls evaluation completed  Falls evaluation completed;Falls prevention discussed      Data saved with a previous flowsheet row definition   Had Bentall procedure 07/2015 - ascending aortic aneurysm repair with AVR ([facility]). Sees cardiology Dr Cindie / Cadence Franchester PA for chronic HFmrEF, on Entresto , spironolactone  and Farxiga .  Afib - diagnosed 08/2023, managed with amiodarone  and Toprol  XL s/p cardioversions 10/2023, 12/2023.  OSA - new diagnosis, followed by Dr Jess on CPAP.   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  H/o Hep B infection - spontaneously cleared.   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  Newly noted pancreatic lesion thought small IPMN with plan to monitor with  imaging Q2 yrs (06/2025).   COVID infection 01/2024 - has recovered.   Left great toenail abnormal for several months. No pain or symptoms. No trauma.   Planning on becoming sexually active and requests PGE5i Rx - notes trouble obtaining erection, viagra  previously effective. No chest pain with sex. Not on nitroglycerine. States cardiology was ok with PGE5i use.   Preventative: COLONOSCOPY WITH PROPOFOL  02/24/2019 - TA, HP, rpt 5 yrs ([facility]) - requests referral to [facility].  EGD 08/2020 - WNL, HH, rpt 3 yrs [facility])  Prostate cancer screening - yearly PSA  Lung cancer screening - not eligible Flu yearly Tetanus - 08/2012 Prevnar 13 - 12/2017, pneumovax 12/2018, Prevnar-20 today  COVID vaccine - completed Pfizer series 08/2019 Shingrix - discussed.  Advanced directive: reviewed through [employer]. HCPOA is aunt June Reece then Elsie Christmas. Grants discretion to HCPOA for life support decision if terminal or incurable condition.  Seat belt use discussed.  Sunscreen use discussed. No changing moles on skin.  Non smoker  Alcohol - stopped 2021 Dentist - Q6 mo  Eye exam - yearly - Dr Dorise Bowel - no constipation  Bladder - no incontinence   Caffeine: occasional   Lives alone, 1 cat  Occupation: owns [employer]  Edu: BS  Activity: planning on restarting stationary bicycle  Diet: good water , fruits/vegetables daily, fish 2x/wk     Relevant past medical, surgical, family and social history reviewed and updated as indicated. Interim medical history since our last visit reviewed. Allergies and medications reviewed and updated. Outpatient Medications Prior to Visit  Medication Sig Dispense Refill   amiodarone  (PACERONE ) 200 MG tablet Take 1 tablet (200 mg total) by mouth daily.     atorvastatin (LIPITOR) 20 MG tablet Take 20 mg by mouth daily.     dapagliflozin  propanediol (FARXIGA ) 10 MG TABS tablet Take 1 tablet (10 mg total) by mouth daily before breakfast. 90 tablet 3   ELIQUIS  5 MG  TABS tablet TAKE 1 TABLET BY MOUTH TWICE A DAY 60 tablet 5   fluticasone  (FLONASE ) 50 MCG/ACT nasal spray Place 2 sprays into both nostrils daily as needed for allergies.     metoprolol  succinate (TOPROL -XL) 25 MG 24 hr tablet Take 25 mg by mouth daily.     sacubitril -valsartan  (ENTRESTO ) 24-26 MG Take 1 tablet by mouth 2 (two) times daily. 180 tablet 3   spironolactone  (ALDACTONE ) 25 MG tablet Take 0.5 tablets (12.5 mg total) by mouth daily. 45 tablet 3   No facility-administered medications prior to visit.     Per HPI unless specifically indicated in ROS section below Review of Systems  Objective:  BP 118/78   Pulse 62   Temp 98.6 F (37 C) (Oral)   Ht 5' 11 (1.803 m)   Wt 220 lb (99.8 kg)   BMI 30.68 kg/m   Wt Readings from Last 3 Encounters:  04/08/24 220 lb (99.8 kg)  04/08/24 220 lb (99.8 kg)  03/18/24 220 lb 8 oz (100 kg)      Physical Exam Vitals and nursing note reviewed.  Constitutional:      General: He is not in acute distress.    Appearance: Normal appearance. He is well-developed. He is not ill-appearing.  HENT:     Head: Normocephalic and atraumatic.     Right Ear: Hearing, tympanic membrane, ear canal and external ear normal.     Left Ear: Hearing, tympanic membrane, ear canal and external ear normal.     Mouth/Throat:     Mouth: Mucous membranes are moist.     Pharynx: Oropharynx is clear. No oropharyngeal exudate or posterior oropharyngeal erythema.  Eyes:     General: No scleral icterus.    Extraocular Movements: Extraocular movements intact.     Conjunctiva/sclera: Conjunctivae normal.     Pupils: Pupils are equal, round, and reactive to light.  Neck:     Thyroid : No thyroid  mass or thyromegaly.     Vascular: No carotid bruit.  Cardiovascular:     Rate and Rhythm: Normal rate and regular rhythm.     Pulses: Normal pulses.          Radial pulses are 2+ on the right side and 2+ on the left side.     Heart sounds: Normal heart sounds. No murmur  heard. Pulmonary:     Effort: Pulmonary effort is normal. No respiratory distress.     Breath sounds: Normal breath sounds. No wheezing, rhonchi or rales.  Abdominal:     General: Bowel sounds are normal. There is no distension.     Palpations: Abdomen is soft. There is no mass.     Tenderness: There is no abdominal tenderness. There is no guarding or rebound.     Hernia: No hernia is present.  Musculoskeletal:        General: Normal range of motion.     Cervical back: Normal range of motion and neck supple.     Right lower leg: No edema.     Left lower leg: No edema.  Lymphadenopathy:     Cervical: No cervical adenopathy.  Skin:    General: Skin is warm and dry.     Findings: No rash.     Comments:  Thickened onychomycotic left great toenail   Neurological:     General: No focal deficit present.     Mental Status: He is alert and oriented to person, place, and time.  Psychiatric:        Mood and Affect: Mood normal.        Behavior: Behavior normal.        Thought Content: Thought content normal.        Judgment: Judgment normal.       Results for orders placed or performed in visit on 03/16/24  ECHOCARDIOGRAM LIMITED   Collection Time: 03/16/24  4:08 PM  Result Value Ref Range   S' Lateral 4.26 cm   Area-P 1/2 1.87 cm2   Est EF 40 - 45%    Lab Results  Component Value Date   TSH 1.88 01/08/2019    Assessment & Plan:   Problem List Items Addressed This Visit     Advanced care planning/counseling discussion - Primary (Chronic)   Advanced directive: previously reviewed through [employer]. HCPOA is aunt June Reece then Elsie Christmas. Grants discretion to HCPOA for life support decision if terminal or incurable condition.       HTN (hypertension)   Chronic, stable on current regimen - continue.       Relevant Medications   sildenafil  (VIAGRA ) 100 MG tablet   History of renal cell carcinoma   S/p nephrectomy   CKD (chronic kidney disease) stage 3, GFR 30-59 ml/min  (HCC)   Chronic. Update labwork. H/o R nephrectomy.       Relevant Orders   Comprehensive metabolic panel with GFR   Phosphorus   VITAMIN D  25 Hydroxy (Vit-D Deficiency, Fractures)   Microalbumin / creatinine urine ratio   Parathyroid  hormone, intact (no Ca)   CBC with Differential/Platelet   HLD (hyperlipidemia)   Chronic, stable period on atorvastatin 20mg  - continue. Update FLP.  The 10-year ASCVD risk score (Arnett DK, et al., 2019) is: 22.8%   Values used to calculate the score:     Age: 35 years     Clincally relevant sex: Male     Is Non-Hispanic African American: No     Diabetic: No     Tobacco smoker: No     Systolic Blood Pressure: 118 mmHg     Is BP treated: Yes     HDL Cholesterol: 31.1 mg/dL     Total Cholesterol: 184 mg/dL       Relevant Medications   sildenafil  (VIAGRA ) 100 MG tablet   Other Relevant Orders   Lipid panel   Comprehensive metabolic panel with GFR   Atrial fibrillation (HCC) [I48.91]   Chronic, stable period on amiodarone  and Toprol  XL. He also continues eliquis  5mg  bid.  Regularly sees afib team.  Update TSH on regular amiodarone       Relevant Medications   sildenafil  (VIAGRA ) 100 MG tablet   Other Relevant Orders   CBC with Differential/Platelet   TSH   T4, free   Hypercoagulable state due to atrial fibrillation (HCC)   Continues  eliquis       S/P ascending aortic aneurysm repair   S/P aortic valve replacement   S/p bental procedure for dilated ascending thoracic aorta and bicuspid aortic valve 2017      Cirrhosis of liver without ascites (HCC)   Update LFTs. Last saw GI [facility]) 03/2023 - due for f/u Requests new referral to [facility] in [place].  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       Relevant Orders   VITAMIN D  25 Hydroxy (Vit-D Deficiency, Fractures)   OSA (obstructive sleep apnea)   Relatively new diagnosis - now on CPAP, followed by [facility] pulmonology Dr Jess       Onychomycosis   Mild. Rec against systemic treatment in cirrhosis and CKD history. Discussed topical treatment - he will start with OTC funginail.      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  He can start with 50 mg dose, and increase to 100 mg if necessary. The method of use 1 hour prior to anticipated intercourse is explained. He should not use any more than one tablet in a 24 hour period. The side effects of possible headache, flushing, dyspepsia and transient changes in vision have been explained. Advised to seek urgent medical care if he develops priapism. The patient is not taking nitrates. I have counseled him that taking Viagra  with nitrates of any form can cause life threatening drops in blood pressure.   States received ok from cardiology to use PGE5i.       Other Visit Diagnoses       Need for vaccination against Streptococcus pneumoniae       Relevant Orders   Pneumococcal conjugate vaccine 20-valent (Prevnar 20) (Completed)     Special screening for malignant neoplasm of prostate       Relevant Orders   PSA, Medicare     Special screening for malignant neoplasms, colon       Relevant Orders   Ambulatory referral to Gastroenterology        Meds ordered this encounter  Medications   sildenafil  (VIAGRA ) 100 MG tablet    Sig: Take 0.5-1 tablets (50-100 mg total) by mouth daily as needed for erectile dysfunction.    Dispense:  5 tablet     Refill:  6    Orders Placed This Encounter  Procedures   Pneumococcal conjugate vaccine 20-valent (Prevnar 20)   Lipid panel    Standing Status:   Future    Expiration Date:   04/11/2025   Comprehensive metabolic panel with GFR    Standing Status:   Future    Expiration Date:   04/11/2025   Phosphorus    Standing Status:   Future    Expiration Date:   04/11/2025   VITAMIN D  25 Hydroxy (Vit-D Deficiency, Fractures)    Standing Status:   Future    Expiration Date:   04/11/2025  Microalbumin / creatinine urine ratio    Standing Status:   Future    Expiration Date:   04/11/2025   Parathyroid  hormone, intact (no Ca)    Standing Status:   Future    Expiration Date:   04/11/2025   CBC with Differential/Platelet    Standing Status:   Future    Expiration Date:   04/11/2025   TSH    Standing Status:   Future    Expiration Date:   04/11/2025   T4, free    Standing Status:   Future    Expiration Date:   04/11/2025   Protime-INR    Standing Status:   Future    Expiration Date:   04/11/2025   PSA, Medicare    Standing Status:   Future    Expiration Date:   04/11/2025   Ambulatory referral to Gastroenterology    Referral Priority:   Routine    Referral Type:   Consultation    Referral Reason:   Specialty Services Required    Number of Visits Requested:   1    Patient Instructions  Flu shot today Prevnar-20 today  If interested, check with pharmacy about new 2 shot shingles series (shingrix).  We will refer you to  GI to discuss repeat colonoscopy. Return fasting at your convenience for labwork - schedule up front.  For possible nail fungus - try over the counter funginail lacquer daily for 6+ months.  Good to see you today  Return as needed or in 6 months for follow up visit.   Follow up plan: Return in about 6 months (around 10/07/2024) for follow up visit.  Anton Blas, MD

## 2024-04-08 NOTE — Progress Notes (Signed)
 Subjective:   Aaron Cole is a 73 y.o. who presents for a Medicare Wellness preventive visit.  As a reminder, Annual Wellness Visits don't include a physical exam, and some assessments may be limited, especially if this visit is performed virtually. We may recommend an in-person follow-up visit with your provider if needed.  Visit Complete: In person  Persons Participating in Visit: Patient.  AWV Questionnaire: Yes: Patient Medicare AWV questionnaire was completed by the patient on 04/07/24; I have confirmed that all information answered by patient is correct and no changes since this date.  Cardiac Risk Factors include: advanced age (>64men, >79 women);hypertension;male gender;dyslipidemia;obesity (BMI >30kg/m2)     Objective:    Today's Vitals   04/08/24 1340  BP: 118/78  Pulse: 62  Resp: 20  Temp: 98.6 F (37 C)  TempSrc: Oral  Weight: 220 lb (99.8 kg)  Height: 5' 11 (1.803 m)   Body mass index is 30.68 kg/m.     04/08/2024    1:43 PM 06/03/2023   10:21 AM 05/20/2023    1:28 PM 05/02/2023    3:10 PM 11/16/2022    7:00 PM 08/04/2020    6:58 AM 02/05/2020   11:35 AM  Advanced Directives  Does Patient Have a Medical Advance Directive? Yes Yes Yes Yes Yes Yes Yes  Type of Estate agent of [place];Living will Healthcare Power of [place];Living will Healthcare Power of [place];Living will Healthcare Power of [place];Living will Healthcare Power of [place];Living will Healthcare Power of [place];Living will Living will;Healthcare Power of Attorney  Does patient want to make changes to medical advance directive?       No - Guardian declined  Copy of Healthcare Power of Attorney in Chart? No - copy requested No - copy requested    No - copy requested Yes - validated most recent copy scanned in chart (See row information)    Current Medications (verified) Outpatient Encounter Medications as of 04/08/2024  Medication Sig   amiodarone  (PACERONE ) 200  MG tablet Take 1 tablet (200 mg total) by mouth daily.   atorvastatin (LIPITOR) 20 MG tablet Take 20 mg by mouth daily.   dapagliflozin  propanediol (FARXIGA ) 10 MG TABS tablet Take 1 tablet (10 mg total) by mouth daily before breakfast.   ELIQUIS  5 MG TABS tablet TAKE 1 TABLET BY MOUTH TWICE A DAY   fluticasone  (FLONASE ) 50 MCG/ACT nasal spray Place 2 sprays into both nostrils daily as needed for allergies.   metoprolol  succinate (TOPROL -XL) 25 MG 24 hr tablet Take 25 mg by mouth daily.   sacubitril -valsartan  (ENTRESTO ) 24-26 MG Take 1 tablet by mouth 2 (two) times daily.   spironolactone  (ALDACTONE ) 25 MG tablet Take 0.5 tablets (12.5 mg total) by mouth daily.   No facility-administered encounter medications on file as of 04/08/2024.    Allergies (verified) Carvedilol   History: Past Medical History:  Diagnosis Date   CAD (coronary artery disease) 2013   per CT scan  - brings CD of CT, no report - will need [facility] records of CT scan report   CHF (congestive heart failure) (HCC)    Cirrhosis of liver (HCC) 01/10/2019   By US  03/2020   CKD (chronic kidney disease) stage 3, GFR 30-59 ml/min (HCC) 2015   iatrogenic (decreased nephron mass) established with [facility])   Diverticulosis    by CT scan   Dysrhythmia    Hepatitis C virus infection without hepatic coma 03/02/2019   History of hepatitis B virus infection  spontaneously cleared   HTN (hypertension)    Renal cell carcinoma (HCC) 2013   clear cell s/p R nephrectomy   S/p nephrectomy 2013   [facility]   Sinus headache    recurrent   Sleep apnea 10/31/2023   Past Surgical History:  Procedure Laterality Date   BENTALL PROCEDURE N/A 07/05/2015   Procedure: BENTALL PROCEDURE UTILIZING A 28 X 10 MM WOVEN DOUBLE VELOUR VASCULAR GRAFT AND A GELWEAVE WOVEN VASCULAR GRAFT. 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.;  Surgeon: Dorise MARLA Fellers, MD;  Location: [facility] OR;  Service: Open Heart Surgery;   Laterality: N/A;   CARDIAC VALVE REPLACEMENT     CARDIOVERSION N/A 10/02/2023   Procedure: CARDIOVERSION;  Surgeon: Darliss Rogue, MD;  Location: [facility] ORS;  Service: Cardiovascular;  Laterality: N/A;   CARDIOVERSION N/A 01/15/2024   Procedure: CARDIOVERSION;  Surgeon: Shlomo Wilbert SAUNDERS, MD;  Location: [facility] INVASIVE CV LAB;  Service: Cardiovascular;  Laterality: N/A;   COLONOSCOPY  11/2008   [facility]NL records in chart [facility])   COLONOSCOPY WITH PROPOFOL  N/A 02/24/2019   TA, HP, rpt 5 yrs ([facility])   CORONARY ANGIOPLASTY     ESOPHAGOGASTRODUODENOSCOPY (EGD) WITH PROPOFOL  N/A 08/04/2020   Procedure: ESOPHAGOGASTRODUODENOSCOPY (EGD) WITH PROPOFOL ;  Surgeon: Therisa Bi, MD;  Location: [facility] ENDOSCOPY;  Service: Gastroenterology;  Laterality: N/A;   HYDROCELE EXCISION Right 02/05/2020   Procedure: HYDROCELECTOMY ADULT;  Surgeon: Francisca Rogue BROCKS, MD;  Location: [facility] ORS;  Service: Urology;  Laterality: Right;   NASAL SINUS SURGERY  1978   sinus surgery done at [facility]   NEPHRECTOMY Right 12/2011   [facility], R for RCC   rectal fistula repair  1980s   TEE WITHOUT CARDIOVERSION N/A 07/05/2015   Procedure: TRANSESOPHAGEAL ECHOCARDIOGRAM (TEE);  Surgeon: Dorise MARLA Fellers, MD;  Location: [facility] OR;  Service: Open Heart Surgery;  Laterality: N/A;   Family History  Problem Relation Age of Onset   Cancer Paternal Grandmother        throat   Heart disease Mother    Hypertension Mother    Obesity Mother    Heart disease Father    Diabetes Father    Colon polyps Father    Crohn's disease Father    Coronary artery disease Maternal Uncle        MI   Cancer Paternal Aunt        breast    Cancer Paternal Aunt        brain   Colon polyps Brother    CAD Maternal Grandfather    Stroke Neg Hx    Social History   Socioeconomic History   Marital status: Single    Spouse name: Not on file   Number of children: Not on file   Years of education: Not on file   Highest education level: Bachelor's degree (e.g., BA, AB, BS)   Occupational History   Not on file  Tobacco Use   Smoking status: Never    Passive exposure: Never   Smokeless tobacco: Never  Vaping Use   Vaping status: Never Used  Substance and Sexual Activity   Alcohol use: Not Currently   Drug use: Never   Sexual activity: Not Currently    Birth control/protection: Abstinence  Other Topics Concern   Not on file  Social History Narrative   Caffeine: occasional   Lives alone, 1 cat   Occupation: farming business   Edu: BS   Activity: bikes 3-4x/wk about 1 hour   Diet:  good water , fruits/vegetables daily, steak 1x/wk, fish 2x/wk   Social Drivers of Health   Financial Resource Strain: Low Risk  (04/07/2024)   Overall Financial Resource Strain (CARDIA)    Difficulty of Paying Living Expenses: Not hard at all  Food Insecurity: No Food Insecurity (04/07/2024)   Hunger Vital Sign    Worried About Running Out of Food in the Last Year: Never true    Ran Out of Food in the Last Year: Never true  Transportation Needs: No Transportation Needs (04/07/2024)   PRAPARE - Administrator, Civil Service (Medical): No    Lack of Transportation (Non-Medical): No  Physical Activity: Insufficiently Active (04/07/2024)   Exercise Vital Sign    Days of Exercise per Week: 3 days    Minutes of Exercise per Session: 20 min  Stress: No Stress Concern Present (04/07/2024)   [employer] of Occupational Health - Occupational Stress Questionnaire    Feeling of Stress: Not at all  Social Connections: Unknown (04/07/2024)   Social Connection and Isolation Panel    Frequency of Communication with Friends and Family: Patient declined    Frequency of Social Gatherings with Friends and Family: Patient declined    Attends Religious Services: Patient declined    Database administrator or Organizations: Patient declined    Attends Engineer, structural: Not on file    Marital Status: Patient declined    Tobacco Counseling Counseling given: Not  Answered    Clinical Intake:  Pre-visit preparation completed: Yes  Pain : No/denies pain     BMI - recorded: 30.68 Nutritional Status: BMI > 30  Obese Diabetes: No  Lab Results  Component Value Date   HGBA1C 5.6 06/29/2015     How often do you need to have someone help you when you read instructions, pamphlets, or other written materials from your doctor or pharmacy?: 1 - Never  Interpreter Needed?: No  Information entered by :: B.Deloros Beretta,LPN   Activities of Daily Living     04/07/2024   12:20 PM 01/15/2024    7:37 AM  In your present state of health, do you have any difficulty performing the following activities:  Hearing? 0 0  Vision? 0 0  Difficulty concentrating or making decisions? 0 0  Walking or climbing stairs? 0   Dressing or bathing? 0   Doing errands, shopping? 0   Preparing Food and eating ? N   Using the Toilet? N   In the past six months, have you accidently leaked urine? N   Do you have problems with loss of bowel control? Y   Managing your Medications? N   Managing your Finances? N   Housekeeping or managing your Housekeeping? N     Patient Care Team: Rilla Baller, MD as PCP - General (Family Medicine) Claudene Victory ORN, MD (Inactive) as PCP - Cardiology (Cardiology) Cindie Ole DASEN, MD as PCP - Electrophysiology (Cardiology) Jacobo Evalene PARAS, MD as Consulting Physician (Oncology) Reddy, Pallavi D, MD as Consulting Physician (Sleep Medicine)  I have updated your Care Teams any recent Medical Services you may have received from other providers in the past year.     Assessment:   This is a routine wellness examination for Aaron Cole.  Hearing/Vision screen Hearing Screening - Comments:: Patient denies any hearing difficulties.   Vision Screening - Comments:: Pt says their vision is good with glasses Dr  Armida   Goals Addressed  This Visit's Progress    COMPLETED: Patient Stated       01/22/2020,  I will continue  to walk 4 days a week for 15 minutes.     Patient Stated       I would like to lose weight       Depression Screen     04/08/2024    1:42 PM 05/02/2023    3:21 PM 04/08/2023    2:43 PM 02/05/2023    9:09 AM 04/04/2022    3:50 PM 01/22/2020    3:38 PM 01/08/2019   11:41 AM  PHQ 2/9 Scores  PHQ - 2 Score 0 0 1 0 0 0 0  PHQ- 9 Score   7 2  0     Fall Risk     04/07/2024   12:20 PM 04/08/2023    2:43 PM 02/05/2023    9:09 AM 04/04/2022    3:50 PM 01/22/2020    3:37 PM  Fall Risk   Falls in the past year? 0 1 0 0 0  Number falls in past yr:  1 0  0  Injury with Fall?  1 0  0  Risk for fall due to : No Fall Risks  No Fall Risks  Medication side effect  Follow up Education provided;Falls prevention discussed  Falls evaluation completed  Falls evaluation completed;Falls prevention discussed      Data saved with a previous flowsheet row definition    MEDICARE RISK AT HOME:  Medicare Risk at Home If so, are there any without handrails?: (Patient-Rptd) No Home free of loose throw rugs in walkways, pet beds, electrical cords, etc?: (Patient-Rptd) No Adequate lighting in your home to reduce risk of falls?: (Patient-Rptd) Yes Life alert?: (Patient-Rptd) No Use of a cane, walker or w/c?: (Patient-Rptd) Yes Grab bars in the bathroom?: (Patient-Rptd) No Shower chair or bench in shower?: (Patient-Rptd) No Elevated toilet seat or a handicapped toilet?: (Patient-Rptd) No  TIMED UP AND GO:  Was the test performed?  Yes  Length of time to ambulate 10 feet: 11 sec Gait steady and fast without use of assistive device  Cognitive Function: 6CIT completed    01/22/2020    3:42 PM  MMSE - Mini Mental State Exam  Orientation to time 5  Orientation to Place 5  Registration 3  Attention/ Calculation 5  Recall 3  Language- repeat 1        04/08/2024    1:45 PM  6CIT Screen  What Year? 0 points  What month? 0 points  What time? 0 points  Count back from 20 0 points  Months in reverse 0  points  Repeat phrase 0 points  Total Score 0 points    Immunizations Immunization History  Administered Date(s) Administered   Fluad Quad(high Dose 65+) 04/04/2022   Fluad Trivalent(High Dose 65+) 04/08/2023   INFLUENZA, HIGH DOSE SEASONAL PF 06/04/2019   Influenza,inj,Quad PF,6+ Mos 05/05/2015, 06/19/2016   PFIZER(Purple Top)SARS-COV-2 Vaccination 08/31/2019, 09/21/2019, 05/21/2020   Pneumococcal Conjugate-13 12/30/2017   Pneumococcal Polysaccharide-23 01/08/2019   Tdap 08/19/2012    Screening Tests Health Maintenance  Topic Date Due   Zoster Vaccines- Shingrix (1 of 2) Never done   DTaP/Tdap/Td (2 - Td or Tdap) 08/19/2022   Influenza Vaccine  01/31/2024   Colonoscopy  02/24/2024   COVID-19 Vaccine (4 - 2025-26 season) 03/02/2024   Medicare Annual Wellness (AWV)  04/08/2025   Pneumococcal Vaccine: 50+ Years  Completed   Hepatitis C Screening  Completed  Meningococcal B Vaccine  Aged Out    Health Maintenance Items Addressed: Vaccines Given today: Influenza  Additional Screening:  Vision Screening: Recommended annual ophthalmology exams for early detection of glaucoma and other disorders of the eye. Is the patient up to date with their annual eye exam?  Yes  Who is the provider or what is the name of the office in which the patient attends annual eye exams? Dr Armida  Dental Screening: Recommended annual dental exams for proper oral hygiene  Community Resource Referral / Chronic Care Management: CRR required this visit?  No   CCM required this visit?  No   Plan:    I have personally reviewed and noted the following in the patient's chart:   Medical and social history Use of alcohol, tobacco or illicit drugs  Current medications and supplements including opioid prescriptions. Patient is not currently taking opioid prescriptions. Functional ability and status Nutritional status Physical activity Advanced directives List of other  physicians Hospitalizations, surgeries, and ER visits in previous 12 months Vitals Screenings to include cognitive, depression, and falls Referrals and appointments  In addition, I have reviewed and discussed with patient certain preventive protocols, quality metrics, and best practice recommendations. A written personalized care plan for preventive services as well as general preventive health recommendations were provided to patient.   Aaron LITTIE Saris, LPN   89/07/7972   After Visit Summary: (In Person-Declined) Patient declined AVS at this time.  Notes: Nothing significant to report at this time.

## 2024-04-08 NOTE — Patient Instructions (Signed)
 Aaron Cole,  Thank you for taking the time for your Medicare Wellness Visit. I appreciate your continued commitment to your health goals. Please review the care plan we discussed, and feel free to reach out if I can assist you further.  Medicare recommends these wellness visits once per year to help you and your care team stay ahead of potential health issues. These visits are designed to focus on prevention, allowing your provider to concentrate on managing your acute and chronic conditions during your regular appointments.  Please note that Annual Wellness Visits do not include a physical exam. Some assessments may be limited, especially if the visit was conducted virtually. If needed, we may recommend a separate in-person follow-up with your provider.  Ongoing Care Seeing your primary care provider every 3 to 6 months helps us  monitor your health and provide consistent, personalized care.   Referrals If a referral was made during today's visit and you haven't received any updates within two weeks, please contact the referred provider directly to check on the status.  Recommended Screenings:  Health Maintenance  Topic Date Due   Zoster (Shingles) Vaccine (1 of 2) Never done   DTaP/Tdap/Td vaccine (2 - Td or Tdap) 08/19/2022   Flu Shot  01/31/2024   Colon Cancer Screening  02/24/2024   COVID-19 Vaccine (4 - 2025-26 season) 03/02/2024   Medicare Annual Wellness Visit  04/08/2025   Pneumococcal Vaccine for age over 27  Completed   Hepatitis C Screening  Completed   Meningitis B Vaccine  Aged Out       06/03/2023   10:21 AM  Advanced Directives  Does Patient Have a Medical Advance Directive? Yes  Type of Estate agent of [place];Living will  Copy of Healthcare Power of Attorney in Chart? No - copy requested   Advance Care Planning is important because it: Ensures you receive medical care that aligns with your values, goals, and preferences. Provides guidance  to your family and loved ones, reducing the emotional burden of decision-making during critical moments.  Vision: Annual vision screenings are recommended for early detection of glaucoma, cataracts, and diabetic retinopathy. These exams can also reveal signs of chronic conditions such as diabetes and high blood pressure.  Dental: Annual dental screenings help detect early signs of oral cancer, gum disease, and other conditions linked to overall health, including heart disease and diabetes.  Please see the attached documents for additional preventive care recommendations.

## 2024-04-08 NOTE — Patient Instructions (Addendum)
 Flu shot today Prevnar-20 today  If interested, check with pharmacy about new 2 shot shingles series (shingrix).  We will refer you to [facility] to discuss repeat colonoscopy. Return fasting at your convenience for labwork - schedule up front.  For possible nail fungus - try over the counter funginail lacquer daily for 6+ months.  Good to see you today  Return as needed or in 6 months for follow up visit.

## 2024-04-11 DIAGNOSIS — B351 Tinea unguium: Secondary | ICD-10-CM | POA: Insufficient documentation

## 2024-04-11 DIAGNOSIS — N529 Male erectile dysfunction, unspecified: Secondary | ICD-10-CM | POA: Insufficient documentation

## 2024-04-11 NOTE — Assessment & Plan Note (Addendum)
 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  He can start with 50 mg dose, and increase to 100 mg if necessary. The method of use 1 hour prior to anticipated intercourse is explained. He should not use any more than one tablet in a 24 hour period. The side effects of possible headache, flushing, dyspepsia and transient changes in vision have been explained. Advised to seek urgent medical care if he develops priapism. The patient is not taking nitrates. I have counseled him that taking Viagra  with nitrates of any form can cause life threatening drops in blood pressure.   States received ok from cardiology to use PGE5i.

## 2024-04-11 NOTE — Assessment & Plan Note (Addendum)
 Update levels, unsure if taking replacement.

## 2024-04-11 NOTE — Assessment & Plan Note (Addendum)
 Relatively new diagnosis - now on CPAP, followed by [facility] pulmonology Dr Jess

## 2024-04-11 NOTE — Assessment & Plan Note (Signed)
 S/p resection, followed closely by [facility] team, appreciate their care

## 2024-04-11 NOTE — Assessment & Plan Note (Signed)
Continues eliquis.  

## 2024-04-11 NOTE — Assessment & Plan Note (Signed)
 Chronic, stable on current regimen - continue.

## 2024-04-11 NOTE — Assessment & Plan Note (Addendum)
 Advanced directive: previously reviewed through [employer]. HCPOA is aunt June Reece then Elsie Christmas. Grants discretion to HCPOA for life support decision if terminal or incurable condition.

## 2024-04-11 NOTE — Assessment & Plan Note (Signed)
 Chronic. Update labwork. H/o R nephrectomy.

## 2024-04-11 NOTE — Assessment & Plan Note (Addendum)
 Chronic, stable period on amiodarone  and Toprol  XL. He also continues eliquis  5mg  bid.  Regularly sees afib team.  Update TSH on regular amiodarone 

## 2024-04-11 NOTE — Assessment & Plan Note (Signed)
 Mild. Rec against systemic treatment in cirrhosis and CKD history. Discussed topical treatment - he will start with OTC funginail.

## 2024-04-11 NOTE — Assessment & Plan Note (Addendum)
 Update LFTs. Last saw GI [facility]) 03/2023 - due for f/u Requests new referral to [facility] in [place].  Also due for repeat colonoscopy

## 2024-04-11 NOTE — Assessment & Plan Note (Signed)
 Chronic, stable period on atorvastatin 20mg  - continue. Update FLP.  The 10-year ASCVD risk score (Arnett DK, et al., 2019) is: 22.8%   Values used to calculate the score:     Age: 73 years     Clincally relevant sex: Male     Is Non-Hispanic African American: No     Diabetic: No     Tobacco smoker: No     Systolic Blood Pressure: 118 mmHg     Is BP treated: Yes     HDL Cholesterol: 31.1 mg/dL     Total Cholesterol: 184 mg/dL

## 2024-04-11 NOTE — Assessment & Plan Note (Signed)
 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

## 2024-04-17 ENCOUNTER — Other Ambulatory Visit (INDEPENDENT_AMBULATORY_CARE_PROVIDER_SITE_OTHER)

## 2024-04-17 DIAGNOSIS — E559 Vitamin D deficiency, unspecified: Secondary | ICD-10-CM

## 2024-04-17 DIAGNOSIS — I4811 Longstanding persistent atrial fibrillation: Secondary | ICD-10-CM

## 2024-04-17 DIAGNOSIS — K7460 Unspecified cirrhosis of liver: Secondary | ICD-10-CM

## 2024-04-17 DIAGNOSIS — N1831 Chronic kidney disease, stage 3a: Secondary | ICD-10-CM

## 2024-04-17 DIAGNOSIS — Z125 Encounter for screening for malignant neoplasm of prostate: Secondary | ICD-10-CM | POA: Diagnosis not present

## 2024-04-17 DIAGNOSIS — E782 Mixed hyperlipidemia: Secondary | ICD-10-CM

## 2024-04-17 LAB — COMPREHENSIVE METABOLIC PANEL WITH GFR
ALT: 21 U/L (ref 0–53)
AST: 23 U/L (ref 0–37)
Albumin: 4.4 g/dL (ref 3.5–5.2)
Alkaline Phosphatase: 61 U/L (ref 39–117)
BUN: 22 mg/dL (ref 6–23)
CO2: 26 meq/L (ref 19–32)
Calcium: 9.3 mg/dL (ref 8.4–10.5)
Chloride: 106 meq/L (ref 96–112)
Creatinine, Ser: 1.63 mg/dL — ABNORMAL HIGH (ref 0.40–1.50)
GFR: 42.09 mL/min — ABNORMAL LOW (ref >60.00)
Glucose, Bld: 94 mg/dL (ref 70–99)
Potassium: 4.8 meq/L (ref 3.5–5.1)
Sodium: 139 meq/L (ref 135–145)
Total Bilirubin: 1 mg/dL (ref 0.2–1.2)
Total Protein: 7.4 g/dL (ref 6.0–8.3)

## 2024-04-17 LAB — CBC WITH DIFFERENTIAL/PLATELET
Basophils Absolute: 0 K/uL (ref 0.0–0.1)
Basophils Relative: 0.6 % (ref 0.0–3.0)
Eosinophils Absolute: 0.1 K/uL (ref 0.0–0.7)
Eosinophils Relative: 2.3 % (ref 0.0–5.0)
HCT: 44.6 % (ref 39.0–52.0)
Hemoglobin: 14.9 g/dL (ref 13.0–17.0)
Lymphocytes Relative: 33.4 % (ref 12.0–46.0)
Lymphs Abs: 2 K/uL (ref 0.7–4.0)
MCHC: 33.3 g/dL (ref 30.0–36.0)
MCV: 89.4 fl (ref 78.0–100.0)
Monocytes Absolute: 0.6 K/uL (ref 0.1–1.0)
Monocytes Relative: 9.3 % (ref 3.0–12.0)
Neutro Abs: 3.3 K/uL (ref 1.4–7.7)
Neutrophils Relative %: 54.4 % (ref 43.0–77.0)
Platelets: 152 K/uL (ref 150.0–400.0)
RBC: 4.99 Mil/uL (ref 4.22–5.81)
RDW: 14.9 % (ref 11.5–15.5)
WBC: 6.1 K/uL (ref 4.0–10.5)

## 2024-04-17 LAB — MICROALBUMIN / CREATININE URINE RATIO
Creatinine,U: 61.2 mg/dL
Microalb Creat Ratio: 13.9 mg/g (ref 0.0–30.0)
Microalb, Ur: 0.8 mg/dL (ref 0.0–1.9)

## 2024-04-17 LAB — LIPID PANEL
Cholesterol: 121 mg/dL (ref 0–200)
HDL: 30.3 mg/dL — ABNORMAL LOW (ref >39.00)
LDL Cholesterol: 66 mg/dL (ref 0–99)
NonHDL: 90.57
Total CHOL/HDL Ratio: 4
Triglycerides: 125 mg/dL (ref 0.0–149.0)
VLDL: 25 mg/dL (ref 0.0–40.0)

## 2024-04-17 LAB — PROTIME-INR
INR: 1.4 ratio — ABNORMAL HIGH (ref 0.8–1.0)
Prothrombin Time: 14.3 s — ABNORMAL HIGH (ref 9.6–13.1)

## 2024-04-17 LAB — VITAMIN D 25 HYDROXY (VIT D DEFICIENCY, FRACTURES): VITD: 24.56 ng/mL — ABNORMAL LOW (ref 30.00–100.00)

## 2024-04-17 LAB — TSH: TSH: 2.09 u[IU]/mL (ref 0.35–5.50)

## 2024-04-17 LAB — T4, FREE: Free T4: 0.85 ng/dL (ref 0.60–1.60)

## 2024-04-17 LAB — PSA, MEDICARE: PSA: 1.33 ng/mL (ref 0.10–4.00)

## 2024-04-17 LAB — PHOSPHORUS: Phosphorus: 3.4 mg/dL (ref 2.3–4.6)

## 2024-04-18 LAB — PARATHYROID HORMONE, INTACT (NO CA): PTH: 34 pg/mL (ref 16–77)

## 2024-04-24 ENCOUNTER — Ambulatory Visit: Payer: Self-pay | Admitting: Family Medicine

## 2024-05-11 DIAGNOSIS — Z905 Acquired absence of kidney: Secondary | ICD-10-CM | POA: Diagnosis not present

## 2024-05-11 DIAGNOSIS — R001 Bradycardia, unspecified: Secondary | ICD-10-CM | POA: Diagnosis not present

## 2024-05-11 DIAGNOSIS — I10 Essential (primary) hypertension: Secondary | ICD-10-CM | POA: Diagnosis not present

## 2024-05-11 DIAGNOSIS — N1832 Chronic kidney disease, stage 3b: Secondary | ICD-10-CM | POA: Diagnosis not present

## 2024-05-18 ENCOUNTER — Other Ambulatory Visit: Payer: Self-pay

## 2024-05-18 ENCOUNTER — Telehealth: Payer: Self-pay

## 2024-05-18 DIAGNOSIS — Z860100 Personal history of colon polyps, unspecified: Secondary | ICD-10-CM

## 2024-05-18 MED ORDER — NA SULFATE-K SULFATE-MG SULF 17.5-3.13-1.6 GM/177ML PO SOLN: 1.0000 | Freq: Once | ORAL | 0 refills | Status: AC

## 2024-05-18 NOTE — Telephone Encounter (Signed)
 Gastroenterology Pre-Procedure Review  Request Date: 07/27/24 Requesting Physician: Dr. Jinny  PATIENT REVIEW QUESTIONS: The patient responded to the following health history questions as indicated:    1. Are you having any GI issues? no 2. Do you have a personal history of Polyps? yes (last colonoscopy performed by Dr. Jinny 02/24/19 recommended repeat in 5 years/) 3. Do you have a family history of Colon Cancer or Polyps? no 4. Diabetes Mellitus? yes (has been advised to stop farxiga  3 days prior and noted on instructions) 5. Joint replacements in the past 12 months?no 6. Major health problems in the past 3 months?no 7. Any artificial heart valves, MVP, or defibrillator?no    MEDICATIONS & ALLERGIES:    Patient reports the following regarding taking any anticoagulation/antiplatelet therapy:   Plavix, Coumadin , Eliquis , Xarelto, Lovenox, Pradaxa, Brilinta, or Effient? yes (Blood thinner advice sent to [facility] for clearance and stop date for eliquis ) Aspirin ? no  Patient confirms/reports the following medications:  Current Outpatient Medications  Medication Sig Dispense Refill   amiodarone  (PACERONE ) 200 MG tablet Take 1 tablet (200 mg total) by mouth daily.     atorvastatin (LIPITOR) 20 MG tablet Take 20 mg by mouth daily.     dapagliflozin  propanediol (FARXIGA ) 10 MG TABS tablet Take 1 tablet (10 mg total) by mouth daily before breakfast. 90 tablet 3   ELIQUIS  5 MG TABS tablet TAKE 1 TABLET BY MOUTH TWICE A DAY 60 tablet 5   fluticasone  (FLONASE ) 50 MCG/ACT nasal spray Place 2 sprays into both nostrils daily as needed for allergies.     metoprolol  succinate (TOPROL -XL) 25 MG 24 hr tablet Take 25 mg by mouth daily.     sacubitril -valsartan  (ENTRESTO ) 24-26 MG Take 1 tablet by mouth 2 (two) times daily. 180 tablet 3   sildenafil  (VIAGRA ) 100 MG tablet Take 0.5-1 tablets (50-100 mg total) by mouth daily as needed for erectile dysfunction. 5 tablet 6   spironolactone  (ALDACTONE ) 25 MG  tablet Take 0.5 tablets (12.5 mg total) by mouth daily. 45 tablet 3   No current facility-administered medications for this visit.    Patient confirms/reports the following allergies:  Allergies  Allergen Reactions   Carvedilol Other (See Comments)    Blurred vision, drowsiness    No orders of the defined types were placed in this encounter.   AUTHORIZATION INFORMATION Primary Insurance: 1D#: Group #:  Secondary Insurance: 1D#: Group #:  SCHEDULE INFORMATION: Date: 07/27/24 Time: Location: [facility]

## 2024-05-20 ENCOUNTER — Telehealth (HOSPITAL_BASED_OUTPATIENT_CLINIC_OR_DEPARTMENT_OTHER): Payer: Self-pay

## 2024-05-20 NOTE — Telephone Encounter (Signed)
   Pre-operative Risk Assessment    Patient Name: Aaron Cole  DOB: [birth year] MRN: [id number]   Date of last office visit: 03/18/24 with [place] Date of next office visit: NA   Request for Surgical Clearance    Procedure:  Colonoscopy  Date of Surgery:  Clearance 07/27/24                                 Surgeon:  Dr. Jinny Socks Group or Practice Name:  [facility]  Phone number:  [phone] Fax number:  [phone]   Type of Clearance Requested:   - Medical  - Pharmacy:  Hold Apixaban  (Eliquis ) not indicated    Type of Anesthesia:  General    Additional requests/questions:    [name]   05/20/2024, 9:29 AM

## 2024-05-20 NOTE — Telephone Encounter (Signed)
 Pharmacy please advise on holding Eliquis  prior to colonoscopy scheduled for 07/27/2024. Last labs 04/17/2024. Thank you.

## 2024-05-21 ENCOUNTER — Ambulatory Visit: Admitting: Sleep Medicine

## 2024-05-26 ENCOUNTER — Ambulatory Visit: Admitting: Sleep Medicine

## 2024-05-27 ENCOUNTER — Telehealth: Payer: Self-pay

## 2024-05-27 NOTE — Telephone Encounter (Addendum)
 S/W pt and scheduled TELE preop appt for 07/06/24. Med Rec and Consent done   Will update the surgeons office.

## 2024-05-27 NOTE — Telephone Encounter (Signed)
 Med Rec and Consent done     Patient Consent for Virtual Visit        Aaron Cole has provided verbal consent on 05/27/2024 for a virtual visit (video or telephone).   CONSENT FOR VIRTUAL VISIT FOR:  Aaron Cole  By participating in this virtual visit I agree to the following:  I hereby voluntarily request, consent and authorize [facility] and its employed or contracted physicians, physician assistants, nurse practitioners or other licensed health care professionals (the Practitioner), to provide me with telemedicine health care services (the "Services) as deemed necessary by the treating Practitioner. I acknowledge and consent to receive the Services by the Practitioner via telemedicine. I understand that the telemedicine visit will involve communicating with the Practitioner through live audiovisual communication technology and the disclosure of certain medical information by electronic transmission. I acknowledge that I have been given the opportunity to request an in-person assessment or other available alternative prior to the telemedicine visit and am voluntarily participating in the telemedicine visit.  I understand that I have the right to withhold or withdraw my consent to the use of telemedicine in the course of my care at any time, without affecting my right to future care or treatment, and that the Practitioner or I may terminate the telemedicine visit at any time. I understand that I have the right to inspect all information obtained and/or recorded in the course of the telemedicine visit and may receive copies of available information for a reasonable fee.  I understand that some of the potential risks of receiving the Services via telemedicine include:  Delay or interruption in medical evaluation due to technological equipment failure or disruption; Information transmitted may not be sufficient (e.g. poor resolution of images) to allow for appropriate medical  decision making by the Practitioner; and/or  In rare instances, security protocols could fail, causing a breach of personal health information.  Furthermore, I acknowledge that it is my responsibility to provide information about my medical history, conditions and care that is complete and accurate to the best of my ability. I acknowledge that Practitioner's advice, recommendations, and/or decision may be based on factors not within their control, such as incomplete or inaccurate data provided by me or distortions of diagnostic images or specimens that may result from electronic transmissions. I understand that the practice of medicine is not an exact science and that Practitioner makes no warranties or guarantees regarding treatment outcomes. I acknowledge that a copy of this consent can be made available to me via my patient portal [facility] MyChart), or I can request a printed copy by calling the office of [facility].    I understand that my insurance will be billed for this visit.   I have read or had this consent read to me. I understand the contents of this consent, which adequately explains the benefits and risks of the Services being provided via telemedicine.  I have been provided ample opportunity to ask questions regarding this consent and the Services and have had my questions answered to my satisfaction. I give my informed consent for the services to be provided through the use of telemedicine in my medical care

## 2024-05-27 NOTE — Telephone Encounter (Signed)
 Requesting office inquiring if pt has been cleared. Looks like request has been sent to our pharm-d for recommendations on Eliquis .

## 2024-05-27 NOTE — Telephone Encounter (Signed)
 Patient with diagnosis of atrial fibrillation on Eliquis  for anticoagulation.    Procedure:  Colonoscopy   Date of Surgery:  Clearance 07/27/24      CHA2DS2-VASc Score = 4   This indicates a 4.8% annual risk of stroke. The patient's score is based upon: CHF History: 1 HTN History: 1 Diabetes History: 0 Stroke History: 0 Vascular Disease History: 1 Age Score: 1 Gender Score: 0    CrCl 59 Platelet count 152  Patient has not had an Afib/aflutter ablation in the last 3 months, DCCV within the last 4 weeks or a watchman implanted in the last 45 days   Per office protocol, patient can hold Eliquis  for 2 days prior to procedure.   Patient will not need bridging with Lovenox (enoxaparin) around procedure.  **This guidance is not considered finalized until pre-operative APP has relayed final recommendations.**

## 2024-05-27 NOTE — Telephone Encounter (Signed)
 No indication moving procedure up sooner. They are just inquiring if pt has been cleared.

## 2024-05-27 NOTE — Telephone Encounter (Signed)
   Name: Aaron Cole  DOB: [birth year]  MRN: [id number]  Primary Cardiologist: Victory LELON Claudene DOUGLAS, MD (Inactive)   Preoperative team, please contact this patient and set up a phone call appointment for further preoperative risk assessment. Please obtain consent and complete medication review. Thank you for your help.  I confirm that guidance regarding antiplatelet and oral anticoagulation therapy has been completed and, if necessary, noted below.  Per office protocol, patient can hold Eliquis  for 2 days prior to procedure.   Patient will not need bridging with Lovenox (enoxaparin) around procedure.  I also confirmed the patient resides in the state of [place] . As per [facility] Medical Board telemedicine laws, the patient must reside in the state in which the provider is licensed.   Shonice Wrisley D Cydne Grahn, NP 05/27/2024, 4:40 PM [facility]

## 2024-06-01 ENCOUNTER — Ambulatory Visit: Admitting: Sleep Medicine

## 2024-06-01 ENCOUNTER — Encounter: Payer: Self-pay | Admitting: Sleep Medicine

## 2024-06-01 VITALS — BP 130/80 | HR 50 | Temp 97.6°F | Ht 71.0 in | Wt 219.8 lb

## 2024-06-01 DIAGNOSIS — G4733 Obstructive sleep apnea (adult) (pediatric): Secondary | ICD-10-CM

## 2024-06-01 DIAGNOSIS — I10 Essential (primary) hypertension: Secondary | ICD-10-CM | POA: Diagnosis not present

## 2024-06-01 DIAGNOSIS — F5104 Psychophysiologic insomnia: Secondary | ICD-10-CM

## 2024-06-01 MED ORDER — TRAZODONE HCL 50 MG PO TABS: 50.0000 mg | ORAL_TABLET | Freq: Every day | ORAL | 3 refills | Status: DC

## 2024-06-01 NOTE — Progress Notes (Signed)
 Name:Aaron Cole MRN: [id number] DOB: 14-Jan-1951   CHIEF COMPLAINT:  CPAP F/U   HISTORY OF PRESENT ILLNESS: Aaron Cole is a 73 y.o. w/ a h/o OSA, HTN, atrial fibrillation and obesity who presents for CPAP F/U visit. Reports using CPAP therapy every night, which is confirmed by compliance data. He is currently using the Airfit F30i FFM, which causes air leaks. Reports still awakening through the night and having difficulty falling back to sleep.    EPWORTH SLEEP SCORE    08/26/2023    1:16 PM  Results of the Epworth flowsheet  Sitting and reading 0  Watching TV 2  Sitting, inactive in a public place (e.g. a theatre or a meeting) 0  As a passenger in a car for an hour without a break 0  Lying down to rest in the afternoon when circumstances permit 2  Sitting and talking to someone 0  Sitting quietly after a lunch without alcohol 2  In a car, while stopped for a few minutes in traffic 0  Total score 6    PAST MEDICAL HISTORY :   has a past medical history of CAD (coronary artery disease) (2013), CHF (congestive heart failure) (HCC), Cirrhosis of liver (HCC) (01/10/2019), CKD (chronic kidney disease) stage 3, GFR 30-59 ml/min (HCC) (2015), Diverticulosis, Dysrhythmia, Hepatitis C virus infection without hepatic coma (03/02/2019), History of hepatitis B virus infection, HTN (hypertension), Renal cell carcinoma (HCC) (2013), S/p nephrectomy (2013), Sinus headache, and Sleep apnea (10/31/2023).  has a past surgical history that includes Nasal sinus surgery (1978); rectal fistula repair (1980s); Nephrectomy (Right, 12/2011); Colonoscopy (11/2008); Bentall procedure (N/A, 07/05/2015); TEE without cardioversion (N/A, 07/05/2015); Coronary angioplasty; Colonoscopy with propofol  (N/A, 02/24/2019); Hydrocele surgery (Right, 02/05/2020); Esophagogastroduodenoscopy (egd) with propofol  (N/A, 08/04/2020); Cardiac valve replacement; Cardioversion (N/A, 10/02/2023); and CARDIOVERSION (N/A,  01/15/2024). Prior to Admission medications   Medication Sig Start Date End Date Taking? Authorizing Provider  amiodarone  (PACERONE ) 200 MG tablet Take 1 tablet (200 mg total) by mouth daily. 01/15/24  Yes Turner, Wilbert SAUNDERS, MD  apixaban  (ELIQUIS ) 5 MG TABS tablet Take 1 tablet (5 mg total) by mouth 2 (two) times daily. 08/21/23  Yes Furth, Cadence H, PA-C  atorvastatin (LIPITOR) 20 MG tablet Take 20 mg by mouth daily. 10/01/23  Yes [provider]  dapagliflozin  propanediol (FARXIGA ) 10 MG TABS tablet Take 1 tablet (10 mg total) by mouth daily before breakfast. 01/28/24  Yes Furth, Cadence H, PA-C  fluticasone  (FLONASE ) 50 MCG/ACT nasal spray Place 2 sprays into both nostrils daily as needed for allergies. 01/15/24  Yes Turner, Wilbert SAUNDERS, MD  metoprolol  succinate (TOPROL -XL) 50 MG 24 hr tablet Take 50 mg by mouth daily. 01/31/24  Yes [provider]  sacubitril -valsartan  (ENTRESTO ) 24-26 MG Take 1 tablet by mouth 2 (two) times daily. 08/21/23  Yes Furth, Cadence H, PA-C  spironolactone  (ALDACTONE ) 25 MG tablet Take 0.5 tablets (12.5 mg total) by mouth daily. 01/28/24 04/27/24 Yes Furth, Cadence H, PA-C   Allergies  Allergen Reactions   Carvedilol Other (See Comments)    Blurred vision, drowsiness    FAMILY HISTORY:  family history includes CAD in his maternal grandfather; Cancer in his paternal aunt, paternal aunt, and paternal grandmother; Colon polyps in his brother and father; Coronary artery disease in his maternal uncle; Crohn's disease in his father; Diabetes in his father; Heart disease in his father and mother; Hypertension in his mother; Obesity in his mother. SOCIAL HISTORY:  reports that he has  never smoked. He has never been exposed to tobacco smoke. He has never used smokeless tobacco. He reports that he does not currently use alcohol. He reports that he does not use drugs.   Review of Systems:  Gen:  Denies  fever, sweats, chills weight loss  HEENT: Denies blurred vision,  double vision, ear pain, eye pain, hearing loss, nose bleeds, sore throat Cardiac:  No dizziness, chest pain or heaviness, chest tightness,edema, No JVD Resp:   No cough, -sputum production, -shortness of breath,-wheezing, -hemoptysis,  Gi: Denies swallowing difficulty, stomach pain, nausea or vomiting, diarrhea, constipation, bowel incontinence Gu:  Denies bladder incontinence, burning urine Ext:   Denies Joint pain, stiffness or swelling Skin: Denies  skin rash, easy bruising or bleeding or hives Endoc:  Denies polyuria, polydipsia , polyphagia or weight change Psych:   Denies depression, insomnia or hallucinations  Other:  All other systems negative  VITAL SIGNS: BP 130/80   Pulse (!) 50   Temp 97.6 F (36.4 C)   Ht 5' 11 (1.803 m)   Wt 219 lb 12.8 oz (99.7 kg)   SpO2 98%   BMI 30.66 kg/m    Physical Examination:   General Appearance: No distress  EYES PERRLA, EOM intact.   NECK Supple, No JVD Pulmonary: normal breath sounds, No wheezing.  CardiovascularNormal S1,S2.  No m/r/g.   Abdomen: Benign, Soft, non-tender. Skin:   warm, no rashes, no ecchymosis  Extremities: normal, no cyanosis, clubbing. Neuro:without focal findings,  speech normal  PSYCHIATRIC: Mood, affect within normal limits.   ASSESSMENT AND PLAN  OSA Patient is using and benefiting from CPAP therapy. Counseled patient on proper mask fit and increasing CPAP compliance. Discussed the consequences of untreated sleep apnea. Advised not to drive drowsy for safety of patient and others. Will follow up in 3 months.   HTN Stable, on current management. Following with PCP.   Insomnia Will try patient on Trazodone 25 mg nightly.    Patient  satisfied with Plan of action and management. All questions answered  I spent a total of 20 minutes reviewing chart data, face-to-face evaluation with the patient, counseling and coordination of care as detailed above.    Jorian Willhoite, M.D.  Sleep Medicine [place]  Pulmonary & Critical Care Medicine

## 2024-06-01 NOTE — Patient Instructions (Addendum)

## 2024-06-09 DIAGNOSIS — C641 Malignant neoplasm of right kidney, except renal pelvis: Secondary | ICD-10-CM | POA: Diagnosis not present

## 2024-06-09 DIAGNOSIS — C649 Malignant neoplasm of unspecified kidney, except renal pelvis: Secondary | ICD-10-CM | POA: Diagnosis not present

## 2024-06-09 DIAGNOSIS — K862 Cyst of pancreas: Secondary | ICD-10-CM | POA: Diagnosis not present

## 2024-06-09 DIAGNOSIS — K8020 Calculus of gallbladder without cholecystitis without obstruction: Secondary | ICD-10-CM | POA: Diagnosis not present

## 2024-06-23 ENCOUNTER — Other Ambulatory Visit: Payer: Self-pay | Admitting: Sleep Medicine

## 2024-06-23 DIAGNOSIS — F5104 Psychophysiologic insomnia: Secondary | ICD-10-CM

## 2024-06-23 DIAGNOSIS — G4733 Obstructive sleep apnea (adult) (pediatric): Secondary | ICD-10-CM

## 2024-07-03 NOTE — Progress Notes (Signed)
 "   Virtual Visit via Telephone Note   Because of [name] co-morbid illnesses, he is at least at moderate risk for complications without adequate follow up.  This format is felt to be most appropriate for this patient at this time.  Due to technical limitations with video connection (technology), today's appointment will be conducted as an audio only telehealth visit, and Aaron Cole verbally agreed to proceed in this manner.   All issues noted in this document were discussed and addressed.  No physical exam could be performed with this format.  Evaluation Performed:  Preoperative cardiovascular risk assessment _____________   Date:  07/06/2024   Patient ID:  Aaron Cole, DOB [birth year], MRN [id number] Patient Location:  Home Provider location:   Office  Primary Care Provider:  Rilla Baller, MD Primary Cardiologist:  Victory LELON Claudene DOUGLAS, MD (Inactive) Chief Complaint / Patient Profile  74 y.o. y/o male with a h/o PVCs, HFrEF, s/p AVR with Bentall procedure in January 2017, paroxysmal atrial fibrillation, OSA on CPAP, history of nephrectomy for renal cell carcinoma who is pending colonoscopy on 07/27/2024 with Dr. Jinny  and presents today for telephonic preoperative cardiovascular risk assessment. History of Present Illness  Aaron Cole is a 74 y.o. male who presents via audio/video conferencing for a telehealth visit today.  Pt was last seen in cardiology clinic on 03/18/24 by Cadence Franchester, PA.  At that time Aaron Cole was doing well.  The patient is now pending procedure as outlined above. Since his last visit, he has remained stable from a cardiac standpoint. Today he denies chest pain, shortness of breath, lower extremity edema, fatigue, palpitations, melena, hematuria, hemoptysis, diaphoresis, weakness, presyncope, syncope, orthopnea, and PND. He is able to achieve greater than 4 METs of activity.  Past Medical History    Past Medical History:  Diagnosis Date    CAD (coronary artery disease) 2013   per CT scan  - brings CD of CT, no report - will need [facility] records of CT scan report   CHF (congestive heart failure) (HCC)    Cirrhosis of liver (HCC) 01/10/2019   By US  03/2020   CKD (chronic kidney disease) stage 3, GFR 30-59 ml/min (HCC) 2015   iatrogenic (decreased nephron mass) established with [facility])   Diverticulosis    by CT scan   Dysrhythmia    Hepatitis C virus infection without hepatic coma 03/02/2019   History of hepatitis B virus infection    spontaneously cleared   HTN (hypertension)    Renal cell carcinoma (HCC) 2013   clear cell s/p R nephrectomy   S/p nephrectomy 2013   [facility]   Sinus headache    recurrent   Sleep apnea 10/31/2023   Past Surgical History:  Procedure Laterality Date   BENTALL PROCEDURE N/A 07/05/2015   Procedure: BENTALL PROCEDURE UTILIZING A 28 X 10 MM WOVEN DOUBLE VELOUR VASCULAR GRAFT AND A GELWEAVE WOVEN VASCULAR GRAFT. 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.;  Surgeon: Dorise MARLA Fellers, MD;  Location: [facility] OR;  Service: Open Heart Surgery;  Laterality: N/A;   CARDIAC VALVE REPLACEMENT     CARDIOVERSION N/A 10/02/2023   Procedure: CARDIOVERSION;  Surgeon: Darliss Rogue, MD;  Location: [facility] ORS;  Service: Cardiovascular;  Laterality: N/A;   CARDIOVERSION N/A 01/15/2024   Procedure: CARDIOVERSION;  Surgeon: Shlomo Wilbert SAUNDERS, MD;  Location: [facility] INVASIVE CV LAB;  Service: Cardiovascular;  Laterality: N/A;   COLONOSCOPY  11/2008   [facility] records in chart [facility])   COLONOSCOPY WITH PROPOFOL  N/A 02/24/2019   TA, HP, rpt 5 yrs ([facility])   CORONARY ANGIOPLASTY     ESOPHAGOGASTRODUODENOSCOPY (EGD) WITH PROPOFOL  N/A 08/04/2020   Procedure: ESOPHAGOGASTRODUODENOSCOPY (EGD) WITH PROPOFOL ;  Surgeon: Therisa Bi, MD;  Location: [facility] ENDOSCOPY;  Service: Gastroenterology;  Laterality: N/A;   HYDROCELE EXCISION Right 02/05/2020   Procedure: HYDROCELECTOMY ADULT;  Surgeon: Francisca Redell BROCKS, MD;  Location: [facility] ORS;  Service: Urology;  Laterality: Right;   NASAL SINUS SURGERY  1978   [facility]   NEPHRECTOMY Right 12/2011   [facility], R for [facility]   rectal fistula repair  1980s   TEE WITHOUT CARDIOVERSION N/A 07/05/2015   Procedure: TRANSESOPHAGEAL ECHOCARDIOGRAM (TEE);  Surgeon: Dorise MARLA Fellers, MD;  Location[facility];  Service: Open Heart Surgery;  Laterality: N/A;   Allergies Allergies[1] Home Medications    Prior to Admission medications  Medication Sig Start Date End Date Taking? Authorizing Provider  amiodarone  (PACERONE ) 200 MG tablet Take 1 tablet (200 mg total) by mouth daily. 01/15/24   Shlomo Wilbert SAUNDERS, MD  atorvastatin (LIPITOR) 20 MG tablet Take 20 mg by mouth daily. 10/01/23   [provider]  dapagliflozin  propanediol (FARXIGA ) 10 MG TABS tablet Take 1 tablet (10 mg total) by mouth daily before breakfast. 01/28/24   Furth, Cadence H, PA-C  ELIQUIS  5 MG TABS tablet TAKE 1 TABLET BY MOUTH TWICE A DAY 03/03/24   Furth, Cadence H, PA-C  fluticasone  (FLONASE ) 50 MCG/ACT nasal spray Place 2 sprays into both nostrils daily as needed for allergies. 01/15/24   Shlomo Wilbert SAUNDERS, MD  metoprolol  succinate (TOPROL -XL) 25 MG 24 hr tablet Take 25 mg by mouth daily. 01/31/24   [provider]  sacubitril -valsartan  (ENTRESTO ) 24-26 MG Take 1 tablet by mouth 2 (two) times daily. 08/21/23   Furth, Cadence H, PA-C  sildenafil  (VIAGRA ) 100 MG tablet Take 0.5-1 tablets (50-100 mg total) by mouth daily as needed for erectile dysfunction. 04/08/24   Rilla Baller, MD  spironolactone  (ALDACTONE ) 25 MG tablet Take 0.5 tablets (12.5 mg total) by mouth daily. 01/28/24   Furth, Cadence H, PA-C  traZODone  (DESYREL ) 50 MG tablet Take 1 tablet (50 mg total) by mouth at bedtime. 06/01/24   Reddy, Pallavi D, MD   Physical Exam  Vital Signs:  Aaron Cole does not have vital signs available for review today. Given telephonic nature of communication, physical exam is  limited. AAOx3. NAD. Normal affect.  Speech and respirations are unlabored. Accessory Clinical Findings   None Assessment & Plan  1.  Preoperative Cardiovascular Risk Assessment: Aaron Cole perioperative risk of a major cardiac event is 0.9% according to the Revised Cardiac Risk Index (RCRI).  Therefore, he is at low risk for perioperative complications.   His functional capacity is good at 6.05 METs according to the Duke Activity Status Index (DASI). Recommendations: According to ACC/AHA guidelines, no further cardiovascular testing needed.  The patient may proceed to surgery at acceptable risk.   Antiplatelet and/or Anticoagulation Recommendations: Per office protocol, patient can hold Eliquis  for 2 days prior to procedure.  Please resume as soon as safe to do so from a bleeding standpoint. Patient will not need bridging with Lovenox (enoxaparin) around procedure. Farxiga  should be held for 72 hours prior to procedure.   The patient was advised that if he develops new symptoms prior to surgery to contact our office to arrange for a follow-up visit,  and he verbalized understanding.  A copy of this note will be routed to requesting surgeon.  Time:   Today, I have spent 10 minutes with the patient with telehealth technology discussing medical history, symptoms, and management plan.    Aaron Filip D Toris Laverdiere, Aaron Cole  07/06/2024, 9:58 AM      [1]  Allergies Allergen Reactions   Carvedilol Other (See Comments)    Blurred vision, drowsiness   "

## 2024-07-06 ENCOUNTER — Ambulatory Visit: Attending: Cardiology

## 2024-07-06 DIAGNOSIS — Z01810 Encounter for preprocedural cardiovascular examination: Secondary | ICD-10-CM

## 2024-07-07 NOTE — Telephone Encounter (Signed)
" °  Patient has been advised to stop Eliquis  2 days prior to his colonoscopy.  Pt verbalized understanding.  Farxiga  stop date was already noted.  Thanks, Rosaline, CMA    Assessment & Plan  1.  Preoperative Cardiovascular Risk Assessment: Mr. Rittenhouse's perioperative risk of a major cardiac event is 0.9% according to the Revised Cardiac Risk Index (RCRI).  Therefore, he is at low risk for perioperative complications.   His functional capacity is good at 6.05 METs according to the Duke Activity Status Index (DASI). Recommendations: According to ACC/AHA guidelines, no further cardiovascular testing needed.  The patient may proceed to surgery at acceptable risk.   Antiplatelet and/or Anticoagulation Recommendations: Per office protocol, patient can hold Eliquis  for 2 days prior to procedure.  Please resume as soon as safe to do so from a bleeding standpoint. Patient will not need bridging with Lovenox (enoxaparin) around procedure. Farxiga  should be held for 72 hours prior to procedure.    The patient was advised that if he develops new symptoms prior to surgery to contact our office to arrange for a follow-up visit, and he verbalized understanding.   A copy of this note will be routed to requesting surgeon.   Time:   Today, I have spent 10 minutes with the patient with telehealth technology discussing medical history, symptoms, and management plan.     Katlyn D West, NP   07/06/2024, 9:58 AM    "

## 2024-07-17 MED ORDER — TRAZODONE HCL 50 MG PO TABS: 50.0000 mg | ORAL_TABLET | Freq: Every day | ORAL | 3 refills | Status: AC

## 2024-07-24 ENCOUNTER — Telehealth: Payer: Self-pay | Admitting: *Deleted

## 2024-07-24 NOTE — Telephone Encounter (Signed)
 Requesting to reschedule due to weather. Inform patient that the next available date for Dr Jinny is 10/22/2024. Patient did not want to wait so we have moved patient to Dr Theophilus on 08/03/2024.  Called endo unit to make the change.   New instructions will be sent.

## 2024-07-28 ENCOUNTER — Telehealth: Payer: Self-pay

## 2024-07-28 NOTE — Telephone Encounter (Signed)
 Patient contacted Endo requested to cancel his 08/03/24 colonoscopy with Dr. Theophilus to go back to Dr. Jinny.  Staff message routed to Trish and Kayla to move to 10/27/24 with DrRONITA Jinny.  Message routed to [place] for [place].  Thanks,  [place], CMA

## 2024-08-04 ENCOUNTER — Telehealth: Payer: Self-pay

## 2024-08-05 NOTE — Telephone Encounter (Signed)
 I have sent an urgent message to [employer] asking them what is needed

## 2024-08-31 ENCOUNTER — Ambulatory Visit: Admitting: Sleep Medicine

## 2024-09-15 ENCOUNTER — Ambulatory Visit: Admitting: Medical

## 2024-10-09 ENCOUNTER — Ambulatory Visit: Admitting: Family Medicine

## 2024-10-27 ENCOUNTER — Encounter: Admission: RE | Payer: Self-pay | Source: Home / Self Care

## 2024-10-27 ENCOUNTER — Ambulatory Visit: Admission: RE | Admit: 2024-10-27 | Source: Home / Self Care | Admitting: Gastroenterology

## 2025-04-09 ENCOUNTER — Ambulatory Visit
